# Patient Record
Sex: Male | Born: 1980 | Race: Black or African American | Hispanic: No | Marital: Single | Smoking: Current every day smoker
Health system: Southern US, Community
[De-identification: ages and names within clinical notes are randomized; demographics above are authoritative.]

## PROBLEM LIST (undated history)

## (undated) DIAGNOSIS — I10 Essential (primary) hypertension: Secondary | ICD-10-CM

## (undated) DIAGNOSIS — W3400XA Accidental discharge from unspecified firearms or gun, initial encounter: Secondary | ICD-10-CM

## (undated) DIAGNOSIS — R569 Unspecified convulsions: Secondary | ICD-10-CM

## (undated) DIAGNOSIS — F1010 Alcohol abuse, uncomplicated: Secondary | ICD-10-CM

## (undated) DIAGNOSIS — F10931 Alcohol use, unspecified with withdrawal delirium: Secondary | ICD-10-CM

## (undated) DIAGNOSIS — I639 Cerebral infarction, unspecified: Secondary | ICD-10-CM

## (undated) DIAGNOSIS — Z89512 Acquired absence of left leg below knee: Secondary | ICD-10-CM

## (undated) DIAGNOSIS — A419 Sepsis, unspecified organism: Secondary | ICD-10-CM

## (undated) HISTORY — PX: BELOW KNEE LEG AMPUTATION: SUR23

## (undated) HISTORY — PX: OTHER SURGICAL HISTORY: SHX169

---

## 2002-11-05 ENCOUNTER — Inpatient Hospital Stay (HOSPITAL_COMMUNITY): Admission: EM | Admit: 2002-11-05 | Discharge: 2002-11-07 | Payer: Self-pay | Admitting: *Deleted

## 2002-11-05 ENCOUNTER — Encounter: Payer: Self-pay | Admitting: *Deleted

## 2002-11-06 ENCOUNTER — Encounter: Payer: Self-pay | Admitting: Infectious Diseases

## 2003-01-14 ENCOUNTER — Encounter
Admission: RE | Admit: 2003-01-14 | Discharge: 2003-04-14 | Payer: Self-pay | Admitting: Physical Medicine & Rehabilitation

## 2003-07-06 ENCOUNTER — Emergency Department (HOSPITAL_COMMUNITY): Admission: EM | Admit: 2003-07-06 | Discharge: 2003-07-06 | Payer: Self-pay | Admitting: Emergency Medicine

## 2003-08-25 ENCOUNTER — Emergency Department (HOSPITAL_COMMUNITY): Admission: EM | Admit: 2003-08-25 | Discharge: 2003-08-25 | Payer: Self-pay | Admitting: Emergency Medicine

## 2009-03-22 ENCOUNTER — Emergency Department (HOSPITAL_COMMUNITY): Admission: EM | Admit: 2009-03-22 | Discharge: 2009-03-22 | Payer: Self-pay | Admitting: Emergency Medicine

## 2009-09-16 ENCOUNTER — Emergency Department (HOSPITAL_COMMUNITY): Admission: EM | Admit: 2009-09-16 | Discharge: 2009-09-16 | Payer: Self-pay | Admitting: Emergency Medicine

## 2009-12-16 ENCOUNTER — Emergency Department (HOSPITAL_COMMUNITY): Admission: EM | Admit: 2009-12-16 | Discharge: 2009-12-16 | Payer: Self-pay | Admitting: Emergency Medicine

## 2010-03-22 ENCOUNTER — Emergency Department (HOSPITAL_COMMUNITY): Admission: EM | Admit: 2010-03-22 | Discharge: 2010-03-23 | Payer: Self-pay | Admitting: Emergency Medicine

## 2011-03-24 LAB — COMPREHENSIVE METABOLIC PANEL
ALT: 16 U/L (ref 0–53)
AST: 21 U/L (ref 0–37)
Albumin: 3.6 g/dL (ref 3.5–5.2)
CO2: 27 mEq/L (ref 19–32)
Calcium: 9.2 mg/dL (ref 8.4–10.5)
GFR calc Af Amer: >60 mL/min (ref >60)
GFR calc non Af Amer: >60 mL/min (ref >60)
Sodium: 137 mEq/L (ref 135–145)

## 2011-03-24 LAB — DIFFERENTIAL
Eosinophils Absolute: 0.4 10*3/uL (ref 0.0–0.7)
Eosinophils Relative: 4 % (ref 0–5)
Lymphocytes Relative: 31 % (ref 12–46)
Lymphs Abs: 3.4 10*3/uL (ref 0.7–4.0)
Monocytes Absolute: 0.7 10*3/uL (ref 0.1–1.0)
Monocytes Relative: 6 % (ref 3–12)

## 2011-03-24 LAB — CBC
MCHC: 33.5 g/dL (ref 30.0–36.0)
Platelets: 295 10*3/uL (ref 150–400)
RBC: 5 MIL/uL (ref 4.22–5.81)
WBC: 11.3 10*3/uL — ABNORMAL HIGH (ref 4.0–10.5)

## 2011-03-24 LAB — RAPID URINE DRUG SCREEN, HOSP PERFORMED
Amphetamines: NOT DETECTED
Benzodiazepines: NOT DETECTED
Cocaine: NOT DETECTED
Tetrahydrocannabinol: NOT DETECTED

## 2011-03-24 LAB — LIPASE, BLOOD: Lipase: 22 U/L (ref 11–59)

## 2011-03-24 LAB — URINALYSIS, ROUTINE W REFLEX MICROSCOPIC
Ketones, ur: NEGATIVE mg/dL
Protein, ur: NEGATIVE mg/dL
Urobilinogen, UA: 0.2 mg/dL (ref 0.0–1.0)

## 2011-04-01 ENCOUNTER — Emergency Department (HOSPITAL_COMMUNITY)
Admission: EM | Admit: 2011-04-01 | Discharge: 2011-04-01 | Disposition: A | Discharge status: Home / Self Care | Payer: Medicaid Other | Attending: Emergency Medicine | Admitting: Emergency Medicine

## 2011-04-01 DIAGNOSIS — F411 Generalized anxiety disorder: Secondary | ICD-10-CM | POA: Insufficient documentation

## 2011-04-01 DIAGNOSIS — S88119A Complete traumatic amputation at level between knee and ankle, unspecified lower leg, initial encounter: Secondary | ICD-10-CM | POA: Insufficient documentation
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
## 2011-09-19 ENCOUNTER — Emergency Department (HOSPITAL_COMMUNITY): Payer: Medicaid Other

## 2011-09-19 ENCOUNTER — Emergency Department (HOSPITAL_COMMUNITY)
Admission: EM | Admit: 2011-09-19 | Discharge: 2011-09-19 | Disposition: A | Discharge status: Home / Self Care | Payer: Medicaid Other | Attending: Emergency Medicine | Admitting: Emergency Medicine

## 2011-09-19 DIAGNOSIS — M25569 Pain in unspecified knee: Secondary | ICD-10-CM | POA: Insufficient documentation

## 2011-09-19 DIAGNOSIS — Y92009 Unspecified place in unspecified non-institutional (private) residence as the place of occurrence of the external cause: Secondary | ICD-10-CM | POA: Insufficient documentation

## 2011-09-19 DIAGNOSIS — I10 Essential (primary) hypertension: Secondary | ICD-10-CM | POA: Insufficient documentation

## 2011-09-19 DIAGNOSIS — S88119A Complete traumatic amputation at level between knee and ankle, unspecified lower leg, initial encounter: Secondary | ICD-10-CM | POA: Insufficient documentation

## 2011-09-19 DIAGNOSIS — M25469 Effusion, unspecified knee: Secondary | ICD-10-CM | POA: Insufficient documentation

## 2011-09-19 DIAGNOSIS — S8990XA Unspecified injury of unspecified lower leg, initial encounter: Secondary | ICD-10-CM | POA: Insufficient documentation

## 2011-09-19 DIAGNOSIS — W108XXA Fall (on) (from) other stairs and steps, initial encounter: Secondary | ICD-10-CM | POA: Insufficient documentation

## 2011-11-15 ENCOUNTER — Emergency Department (HOSPITAL_COMMUNITY): Payer: Medicaid Other

## 2011-11-15 ENCOUNTER — Emergency Department (HOSPITAL_COMMUNITY)
Admission: EM | Admit: 2011-11-15 | Discharge: 2011-11-16 | Disposition: A | Discharge status: Home / Self Care | Payer: Medicaid Other | Attending: Emergency Medicine | Admitting: Emergency Medicine

## 2011-11-15 ENCOUNTER — Encounter: Payer: Self-pay | Admitting: Emergency Medicine

## 2011-11-15 DIAGNOSIS — S88119A Complete traumatic amputation at level between knee and ankle, unspecified lower leg, initial encounter: Secondary | ICD-10-CM | POA: Insufficient documentation

## 2011-11-15 DIAGNOSIS — W19XXXA Unspecified fall, initial encounter: Secondary | ICD-10-CM | POA: Insufficient documentation

## 2011-11-15 DIAGNOSIS — S7290XA Unspecified fracture of unspecified femur, initial encounter for closed fracture: Secondary | ICD-10-CM | POA: Insufficient documentation

## 2011-11-15 DIAGNOSIS — S7292XA Unspecified fracture of left femur, initial encounter for closed fracture: Secondary | ICD-10-CM

## 2011-11-15 DIAGNOSIS — M25569 Pain in unspecified knee: Secondary | ICD-10-CM | POA: Insufficient documentation

## 2011-11-15 NOTE — ED Notes (Signed)
EAV:[id number]<JX> Expected date:11/15/11<BR> Expected time:10:41 PM<BR> Means of arrival:Ambulance<BR> Comments:<BR> EMS, leg pain

## 2011-11-15 NOTE — ED Notes (Signed)
Pt had fallen yesterday, and since then he has had pain in his left knee.  Pt states that he has a sore from his prostetic.  Pt states that he has fluid in his knee.

## 2011-11-16 MED ORDER — OXYCODONE-ACETAMINOPHEN 5-325 MG PO TABS: 2.0000 | ORAL_TABLET | ORAL | Status: AC | PRN

## 2011-11-16 MED ORDER — OXYCODONE-ACETAMINOPHEN 5-325 MG PO TABS
1.0000 | ORAL_TABLET | Freq: Once | ORAL | Status: AC
  Administered 2011-11-16: 1 via ORAL
  Filled 2011-11-16: qty 1

## 2011-11-16 NOTE — ED Provider Notes (Signed)
History     CSN: [id number] Arrival date & time: 11/15/2011 11:05 PM   First MD Initiated Contact with Patient 11/15/11 2318      Chief Complaint  Patient presents with  . Knee Pain    fall, skin ulceration from prostetic, possible infection in knee    (Consider location/radiation/quality/duration/timing/severity/associated sxs/prior treatment) Patient is a 30 y.o. male presenting with knee pain. The history is provided by the patient.  Knee Pain This is a new problem. The current episode started 2 days ago. The problem occurs constantly. The problem has not changed since onset.Pertinent negatives include no chest pain, no abdominal pain, no headaches and no shortness of breath. The symptoms are aggravated by walking. The symptoms are relieved by nothing. He has tried nothing for the symptoms.   patient has left BKA from previous injury and uses prosthesis to walk. He fell 2 days ago and injured his left knee. He now complains of pain and swelling. Pain is sharp in nature. Is not radiating. Moderate to severe. No abrasion or laceration or open skin. No other injury with fall.  No past medical history on file.  Past Surgical History  Procedure Date  . Below the knee amputation     No family history on file.  History  Substance Use Topics  . Smoking status: Not on file  . Smokeless tobacco: Not on file  . Alcohol Use:       Review of Systems  Constitutional: Negative for fever and chills.  HENT: Negative for neck pain and neck stiffness.   Eyes: Negative for pain.  Respiratory: Negative for shortness of breath.   Cardiovascular: Negative for chest pain.  Gastrointestinal: Negative for abdominal pain.  Genitourinary: Negative for dysuria.  Musculoskeletal: Positive for joint swelling and gait problem. Negative for back pain.  Skin: Negative for rash and wound.  Neurological: Negative for headaches.  All other systems reviewed and are negative.    Allergies  Review  of patient's allergies indicates no known allergies.  Home Medications  No current outpatient prescriptions on file.  BP 120/70  Pulse 95  Temp(Src) 98.6 F (37 C) (Oral)  Resp 18  SpO2 99%  Physical Exam  Constitutional: He is oriented to person, place, and time. He appears well-developed and well-nourished.  HENT:  Head: Normocephalic and atraumatic.  Eyes: Conjunctivae and EOM are normal. Pupils are equal, round, and reactive to light.  Neck: Trachea normal. Neck supple. No thyromegaly present.  Cardiovascular: Normal rate, regular rhythm, S1 normal, S2 normal and normal pulses.     No systolic murmur is present   No diastolic murmur is present  Pulses:      Radial pulses are 2+ on the right side, and 2+ on the left side.  Pulmonary/Chest: Effort normal and breath sounds normal. He has no wheezes. He has no rhonchi. He has no rales. He exhibits no tenderness.  Abdominal: Soft. Normal appearance and bowel sounds are normal. There is no tenderness. There is no CVA tenderness and negative Murphy's sign.  Musculoskeletal:       Lower extremity: BKA, knee is tender to palpation with moderate effusion, no skin defects, distal cap refill intact. Hip is nontender and pelvis stable.  Neurological: He is alert and oriented to person, place, and time. He has normal strength. No cranial nerve deficit or sensory deficit. GCS eye subscore is 4. GCS verbal subscore is 5. GCS motor subscore is 6.  Skin: Skin is warm and dry. No rash noted.  He is not diaphoretic.  Psychiatric: His speech is normal.       Cooperative and appropriate    ED Course  Procedures (including critical care time)  Labs Reviewed - No data to display Dg Knee Complete 4 Views Left  11/16/2011  *RADIOLOGY REPORT*  Clinical Data: Status post fall.  Knee pain.  LEFT KNEE - COMPLETE 4+ VIEW  Comparison: 09/19/2011 radiographs.  Findings: The patient is status post below-the-knee amputation. The remaining tibia and fibula  appear normal.  There is a new acute fracture of the distal femur.  This has a component traversing the metaphysis with mild displacement medially.  There is probable intercondylar extension.  There is a moderate sized knee joint effusion.  The patella is intact.  IMPRESSION: Acute mildly displaced distal femur fracture with transverse and probable intercondylar components.  Associated moderate sized hemarthrosis.  Original Report Authenticated By: Gerrianne Scale, M.D.     Xray and pain control   MDM   The injury and distal femur fracture as above. Case discussed as above with orthopedics on call Dr. Rennis Chris. He feels is a nonsurgical issue but will followup in clinic on Wednesday. He recommends pain control, crutches and nonweightbearing to his left lower extremity. Patient stable for discharge with pain controled.         Sunnie Nielsen, MD 11/16/11 [phone]

## 2012-02-18 ENCOUNTER — Emergency Department (HOSPITAL_COMMUNITY)
Admission: EM | Admit: 2012-02-18 | Discharge: 2012-02-18 | Discharge status: Against Medical Advice | Payer: Medicaid Other | Attending: Emergency Medicine | Admitting: Emergency Medicine

## 2012-02-18 ENCOUNTER — Encounter (HOSPITAL_COMMUNITY): Payer: Self-pay

## 2012-02-18 DIAGNOSIS — R040 Epistaxis: Secondary | ICD-10-CM | POA: Insufficient documentation

## 2012-02-18 NOTE — ED Notes (Signed)
Pt states he feels like his mind isn't working.  When asked why he indicated by trying to breathe in through his nose-congestion is heard.  States he has been having panic attacks since 2010 but untreated by medications. Pt states he is very thirsty, so much so he could drink out of the sink right now.

## 2012-02-18 NOTE — ED Notes (Signed)
ZOX:[id number]> Expected date:<BR> Expected time:12:21 PM<BR> Means of arrival:<BR> Comments:<BR> RSV2 - 30yoM minor nosebleed, controlled.  Triage-capable.

## 2012-02-18 NOTE — ED Notes (Signed)
Pt was picked up at a convenience store.  Pt told the clerk that he was getting sob w/running nose.  His nose started to bleed.  EMS arrived and found pt to be poor historian and unable to stay on track when asked questions.  Pt's complaints resloved on ride here.  Pt admitted to drinking 1 beer today.  EMS feels pt may be under influence of an illegal substance.  Vitals: 128/88, 128, 18.

## 2012-04-27 ENCOUNTER — Emergency Department (HOSPITAL_COMMUNITY): Payer: Medicaid Other

## 2012-04-27 ENCOUNTER — Inpatient Hospital Stay (HOSPITAL_COMMUNITY)
Admission: EM | Admit: 2012-04-27 | Discharge: 2012-04-28 | DRG: 081 | Disposition: A | Discharge status: Home / Self Care | Payer: Medicaid Other | Source: Ambulatory Visit | Attending: Internal Medicine | Admitting: Internal Medicine

## 2012-04-27 ENCOUNTER — Encounter (HOSPITAL_COMMUNITY): Payer: Self-pay | Admitting: *Deleted

## 2012-04-27 ENCOUNTER — Inpatient Hospital Stay (HOSPITAL_COMMUNITY): Payer: Medicaid Other

## 2012-04-27 DIAGNOSIS — Y998 Other external cause status: Secondary | ICD-10-CM

## 2012-04-27 DIAGNOSIS — I517 Cardiomegaly: Secondary | ICD-10-CM

## 2012-04-27 DIAGNOSIS — R55 Syncope and collapse: Secondary | ICD-10-CM | POA: Diagnosis present

## 2012-04-27 DIAGNOSIS — W19XXXA Unspecified fall, initial encounter: Secondary | ICD-10-CM

## 2012-04-27 DIAGNOSIS — R000 Tachycardia, unspecified: Secondary | ICD-10-CM | POA: Diagnosis present

## 2012-04-27 DIAGNOSIS — E875 Hyperkalemia: Secondary | ICD-10-CM | POA: Diagnosis present

## 2012-04-27 DIAGNOSIS — Z89512 Acquired absence of left leg below knee: Secondary | ICD-10-CM | POA: Diagnosis present

## 2012-04-27 DIAGNOSIS — E876 Hypokalemia: Secondary | ICD-10-CM | POA: Diagnosis present

## 2012-04-27 DIAGNOSIS — F17200 Nicotine dependence, unspecified, uncomplicated: Secondary | ICD-10-CM | POA: Diagnosis present

## 2012-04-27 DIAGNOSIS — R296 Repeated falls: Secondary | ICD-10-CM | POA: Diagnosis present

## 2012-04-27 DIAGNOSIS — R569 Unspecified convulsions: Secondary | ICD-10-CM

## 2012-04-27 DIAGNOSIS — Z720 Tobacco use: Secondary | ICD-10-CM | POA: Diagnosis present

## 2012-04-27 DIAGNOSIS — I712 Thoracic aortic aneurysm, without rupture: Secondary | ICD-10-CM | POA: Diagnosis present

## 2012-04-27 DIAGNOSIS — F1020 Alcohol dependence, uncomplicated: Secondary | ICD-10-CM | POA: Diagnosis present

## 2012-04-27 DIAGNOSIS — R404 Transient alteration of awareness: Principal | ICD-10-CM | POA: Diagnosis present

## 2012-04-27 DIAGNOSIS — Z8659 Personal history of other mental and behavioral disorders: Secondary | ICD-10-CM

## 2012-04-27 DIAGNOSIS — S88119A Complete traumatic amputation at level between knee and ankle, unspecified lower leg, initial encounter: Secondary | ICD-10-CM

## 2012-04-27 DIAGNOSIS — Y92009 Unspecified place in unspecified non-institutional (private) residence as the place of occurrence of the external cause: Secondary | ICD-10-CM

## 2012-04-27 DIAGNOSIS — G90A Postural orthostatic tachycardia syndrome (POTS): Secondary | ICD-10-CM | POA: Diagnosis present

## 2012-04-27 DIAGNOSIS — I7121 Aneurysm of the ascending aorta, without rupture: Secondary | ICD-10-CM | POA: Diagnosis present

## 2012-04-27 DIAGNOSIS — K760 Fatty (change of) liver, not elsewhere classified: Secondary | ICD-10-CM | POA: Diagnosis present

## 2012-04-27 DIAGNOSIS — K8020 Calculus of gallbladder without cholecystitis without obstruction: Secondary | ICD-10-CM | POA: Diagnosis present

## 2012-04-27 DIAGNOSIS — F1010 Alcohol abuse, uncomplicated: Secondary | ICD-10-CM | POA: Diagnosis present

## 2012-04-27 DIAGNOSIS — F79 Unspecified intellectual disabilities: Secondary | ICD-10-CM | POA: Diagnosis present

## 2012-04-27 HISTORY — DX: Unspecified convulsions: R56.9

## 2012-04-27 LAB — LIPASE, BLOOD: Lipase: 57 U/L (ref 11–59)

## 2012-04-27 LAB — RAPID URINE DRUG SCREEN, HOSP PERFORMED
Benzodiazepines: POSITIVE — AB
Cocaine: NOT DETECTED
Opiates: NOT DETECTED
Tetrahydrocannabinol: NOT DETECTED

## 2012-04-27 LAB — DIFFERENTIAL
Basophils Absolute: 0 10*3/uL (ref 0.0–0.1)
Basophils Relative: 0 % (ref 0–1)
Eosinophils Relative: 1 % (ref 0–5)
Lymphocytes Relative: 15 % (ref 12–46)
Monocytes Absolute: 0.7 10*3/uL (ref 0.1–1.0)
Neutro Abs: 6.2 10*3/uL (ref 1.7–7.7)

## 2012-04-27 LAB — COMPREHENSIVE METABOLIC PANEL
ALT: 104 U/L — ABNORMAL HIGH (ref 0–53)
AST: 269 U/L — ABNORMAL HIGH (ref 0–37)
Albumin: 3.8 g/dL (ref 3.5–5.2)
CO2: 20 mEq/L (ref 19–32)
Calcium: 9.7 mg/dL (ref 8.4–10.5)
Chloride: 94 mEq/L — ABNORMAL LOW (ref 96–112)
Creatinine, Ser: 0.8 mg/dL (ref 0.50–1.35)
GFR calc non Af Amer: >90 mL/min (ref >90)
Sodium: 136 mEq/L (ref 135–145)

## 2012-04-27 LAB — CBC
MCHC: 36 g/dL (ref 30.0–36.0)
Platelets: 43 10*3/uL — ABNORMAL LOW (ref 150–400)
RDW: 16.9 % — ABNORMAL HIGH (ref 11.5–15.5)
WBC: 8.1 10*3/uL (ref 4.0–10.5)

## 2012-04-27 LAB — GLUCOSE, CAPILLARY: Glucose-Capillary: 96 mg/dL (ref 70–99)

## 2012-04-27 LAB — TSH: TSH: 0.525 u[IU]/mL (ref 0.350–4.500)

## 2012-04-27 LAB — PHOSPHORUS: Phosphorus: 3 mg/dL (ref 2.3–4.6)

## 2012-04-27 LAB — MAGNESIUM: Magnesium: 1.7 mg/dL (ref 1.5–2.5)

## 2012-04-27 MED ORDER — ONDANSETRON HCL 4 MG/2ML IJ SOLN
4.0000 mg | Freq: Once | INTRAMUSCULAR | Status: AC
  Administered 2012-04-27: 4 mg via INTRAVENOUS

## 2012-04-27 MED ORDER — LORAZEPAM 2 MG/ML IJ SOLN
1.0000 mg | INTRAMUSCULAR | Status: DC | PRN
Start: 2012-04-27 — End: 2012-04-28

## 2012-04-27 MED ORDER — THIAMINE HCL 100 MG/ML IJ SOLN
Freq: Once | INTRAVENOUS | Status: AC
  Administered 2012-04-27: 15:00:00 via INTRAVENOUS
  Filled 2012-04-27 (×2): qty 1000

## 2012-04-27 MED ORDER — ENOXAPARIN SODIUM 40 MG/0.4ML ~~LOC~~ SOLN
40.0000 mg | SUBCUTANEOUS | Status: DC
  Administered 2012-04-27: 40 mg via SUBCUTANEOUS
  Filled 2012-04-27 (×2): qty 0.4

## 2012-04-27 MED ORDER — IOHEXOL 350 MG/ML SOLN
100.0000 mL | Freq: Once | INTRAVENOUS | Status: AC | PRN
  Administered 2012-04-27: 100 mL via INTRAVENOUS

## 2012-04-27 MED ORDER — LORAZEPAM 2 MG/ML IJ SOLN: 1.0000 mg | Freq: Four times a day (QID) | INTRAMUSCULAR | Status: DC | PRN

## 2012-04-27 MED ORDER — CHLORDIAZEPOXIDE HCL 10 MG PO CAPS: 10.0000 mg | ORAL_CAPSULE | Freq: Three times a day (TID) | ORAL | Status: DC

## 2012-04-27 MED ORDER — FOLIC ACID 1 MG PO TABS
1.0000 mg | ORAL_TABLET | Freq: Every day | ORAL | Status: DC
  Administered 2012-04-28: 1 mg via ORAL
  Filled 2012-04-27: qty 1

## 2012-04-27 MED ORDER — LORAZEPAM 0.5 MG PO TABS: 1.0000 mg | ORAL_TABLET | Freq: Four times a day (QID) | ORAL | Status: DC | PRN

## 2012-04-27 MED ORDER — LORAZEPAM 0.5 MG PO TABS: 2.0000 mg | ORAL_TABLET | ORAL | Status: DC | PRN

## 2012-04-27 MED ORDER — VITAMIN B-1 100 MG PO TABS
100.0000 mg | ORAL_TABLET | Freq: Every day | ORAL | Status: DC
  Administered 2012-04-28: 100 mg via ORAL
  Filled 2012-04-27: qty 1

## 2012-04-27 MED ORDER — SODIUM CHLORIDE 0.9 % IV SOLN: 1.0000 mg | Freq: Once | INTRAVENOUS | Status: DC

## 2012-04-27 MED ORDER — SODIUM CHLORIDE 0.9 % IJ SOLN
3.0000 mL | Freq: Two times a day (BID) | INTRAMUSCULAR | Status: DC
  Administered 2012-04-27 – 2012-04-28 (×2): 3 mL via INTRAVENOUS

## 2012-04-27 MED ORDER — THIAMINE HCL 100 MG/ML IJ SOLN
Freq: Once | INTRAVENOUS | Status: AC
  Administered 2012-04-27: 04:00:00 via INTRAVENOUS
  Filled 2012-04-27: qty 1000

## 2012-04-27 MED ORDER — SODIUM CHLORIDE 0.9 % IV BOLUS (SEPSIS)
1000.0000 mL | Freq: Once | INTRAVENOUS | Status: AC
  Administered 2012-04-27: 1000 mL via INTRAVENOUS

## 2012-04-27 MED ORDER — ONDANSETRON HCL 4 MG/2ML IJ SOLN
INTRAMUSCULAR | Status: AC
  Administered 2012-04-27: 4 mg via INTRAVENOUS
  Filled 2012-04-27: qty 2

## 2012-04-27 MED ORDER — LORAZEPAM 2 MG/ML IJ SOLN
2.0000 mg | Freq: Once | INTRAMUSCULAR | Status: AC
  Administered 2012-04-27: 2 mg via INTRAVENOUS
  Filled 2012-04-27: qty 1

## 2012-04-27 MED ORDER — LORAZEPAM 2 MG/ML IJ SOLN
1.0000 mg | Freq: Once | INTRAMUSCULAR | Status: AC
  Administered 2012-04-27: 1 mg via INTRAVENOUS
  Filled 2012-04-27: qty 1

## 2012-04-27 NOTE — H&P (Signed)
Hospital Admission Note Date: 04/27/2012  Patient name:  [name]  Medical record number:  [id number] Date of birth:  April 11, 1981  Age: 31 y.o. Gender:  male PCP:    Provider Not In System  Medical Service:   Internal Medicine Teaching Service   Medical Service: [place]  Attending physician: Dr. Rogelia Boga    1st Contact:     Dr. Dierdre Searles                Pager: [phone] 2nd Contact:    Dr. Allena Katz                   Pager: [phone]  After 5 pm or weekends: 1st Contact:      Pager: [phone] 2nd Contact:      Pager: [phone]     Chief Complaint: Dizziness and fall  History of Present Illness: Patient is a 31 y.o. male with a PMHx of left BKA and chronic alcohol use presented to the ER last night with chief complaint of loss of consciousness after a fall. Patient was apparently sitting in his bed and watching TV when he got up to light a cigarette, as soon as he got up he felt dizzy and lightheaded and fell to the ground. He did not hit his head and fell sideways. Patient denied any emotional distress, fatigue or palpitations prior to the fall. Patient has never had this kind of fall in the past. He was down for about 3 minutes according to him before he was woken up by his sister. His sister called EMS which noted the patient was shaking all the extremities, although complete report is unknown. Patient denied any nausea, vomiting, cough, chest pain or any neurological prodrome prior to fall. No bladder or bowel incontinence noted. Patient was noted to be confused in the emergency room.   Apparently patient got back from a trip from [place] 2 days ago which was a road trip. Patient sees that usually he drinks 9-10 bottles of 40 hours beers a day but he hasn't been drinking as much since last 4-5 days as he was on road. He also mentioned that he was feeling shakes and tremors for about 4-5 days and he drank a few beers yesterday(prior to event) which calmed him down a little bit.  Patient does  not have any history of seizures, cerebrovascular events, migraine or any other intracranial abnormality. He does not take any medications and does not have a primary care physician at this time. Patient follows up with orthopedics for his left BKA.   Current Outpatient Medications: None  Allergies: No known drug allergies  Past Medical History: Left BKA  Past Surgical History: Past Surgical History  Procedure Date  . Below the knee amputation   . Below knee leg amputation     LEFT    Family History: No family history of structural heart disease or sudden cardiac death  Social History: Patient is not married and he stays with his sister in [place]. He attended a special school and did not attend college. He is on disability secondary to his left BKA and mental retardation. The patient has never taken a job. He drinks 10 bottles of 40 all his beer a day and he has been drinking for the last 10 years. He smokes half pack per day and he's been smoking for about 16 years which makes it 8 pack years of smoking.  Review of Systems: Pertinent items are noted in HPI.  Vital Signs:  BP 117/68  Pulse 73  Temp(Src) 98.8 F (37.1 C) (Oral)  Resp 18  SpO2 97%   Physical Exam: General: Vital signs reviewed and noted. Well-developed, well-nourished, in no acute distress; alert, appropriate and cooperative throughout examination.  Head: Normocephalic, atraumatic.  Eyes: PERRL, EOMI, No signs of anemia or jaundince.  Ears: TM nonerythematous, not bulging, good light reflex bilaterally.  Nose: Mucous membranes moist, not inflammed, nonerythematous.  Throat: Oropharynx nonerythematous, no exudate appreciated.   Neck: No deformities, masses, or tenderness noted.Supple, No carotid Bruits, no JVD.  Lungs:  Normal respiratory effort. Clear to auscultation BL without crackles or wheezes.  Heart: Tachycardic, regular rate and rhythm, S1 and S2 normal without gallop, murmur, or rubs.    Abdomen:  BS normoactive. Soft, Nondistended, non-tender.  No masses or organomegaly.  Extremities: No pretibial edema. Patient is left BKA  Neurologic: A&O X 3, CN II - XII are grossly intact. Motor strength is 5/5 in the all 4 extremities, Sensations intact to light touch, Cerebellar signs negative.  Skin: No visible rashes, scars.   Lab results: CBC:    Component Value Date/Time   WBC 8.1 04/27/2012 0106   HGB 14.6 04/27/2012 0106   HCT 40.5 04/27/2012 0106   PLT 43* 04/27/2012 0106   MCV 92.5 04/27/2012 0106   NEUTROABS 6.2 04/27/2012 0106   LYMPHSABS 1.2 04/27/2012 0106   MONOABS 0.7 04/27/2012 0106   EOSABS 0.1 04/27/2012 0106   BASOSABS 0.0 04/27/2012 0106      Comprehensive Metabolic Panel:    Component Value Date/Time   NA 136 04/27/2012 0106   K 3.3* 04/27/2012 0106   CL 94* 04/27/2012 0106   CO2 20 04/27/2012 0106   BUN 7 04/27/2012 0106   CREATININE 0.80 04/27/2012 0106   GLUCOSE 94 04/27/2012 0106   CALCIUM 9.7 04/27/2012 0106   AST 269* 04/27/2012 0106   ALT 104* 04/27/2012 0106   ALKPHOS 130* 04/27/2012 0106   BILITOT 0.7 04/27/2012 0106   PROT 7.7 04/27/2012 0106   ALBUMIN 3.8 04/27/2012 0106      Imaging results:   US abdomen: 04/27/12  1. Distended gallbladder with sludge and possible tiny stones.  There is no evidence for cholecystitis.  2. Fatty infiltration of the liver.  3. No other acute abnormality of the abdomen.  CT head: 04/27/12 IMPRESSION:  No acute intracranial abnormalities identified.  CXR: 04/27/12 IMPRESSION:  No evidence of active pulmonary disease.    Assessment & Plan: Patient is a 31 year old man with past medical history significant for alcohol abuse and left BKA and mental retardation who was admitted for loss of consciousness after a fall.  Problem #1 syncope #2 alcohol abuse complicated by transaminitis #3 mental retardation #4 mild hypokalemia #5 anion gap noted on CMP  Plan: Patient had an episode of syncope which is  evidenced by the fact that he regained his consciousness. The differential is wide and broadly includes neurocardiogenic causes, orthostatic causes, cardiovascular causes, and less likely neurologic. The 12-lead echocardiogram is negative for any abnormalities, I would admit the patient to telemetry to monitor his heart rate to look for arrhythmia as a cause for syncope. I would check orthostatic blood pressure to r/o hypovolemia. Neurocardiogenic or vasovagal cause is likely as evidenced by syncope at the change of position. I will also do a 2-D echocardiogram to rule out a structural heart abnormality.  Patient is a chronic alcoholic and given that he had been having shakes and tremors since last few days, a  seizure activity From alcohol withdrawal could very well be a cause for his syncope. I would put him on CIWA protocol every 2 hours and start him on scheduled Librium. Ativan when necessary for both anxiety and seizure activity has been put in the chart. I would replete potassium. Start him on regular diet and supplement thiamine and folate which is usually low in chronic alcoholics.  Patient has an anion gap of about 22 but there is no reason to believe that he is acidotic at this moment. Patient's urine ketones were negative and he is back to his baseline mental status. We will monitor daily BMPs to document resolution. The likelihood of this being non ethanol toxicity is very low given that patient's mental status is back to baseline.   DVT PPX:  Lovenox  Lars Mage MD R3 Internal Medicine Resident Pager [phone] 9:14 AM

## 2012-04-27 NOTE — Progress Notes (Signed)
  Echocardiogram 2D Echocardiogram has been performed.  Retina Bernardy L 04/27/2012, 4:25 PM
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
## 2012-04-27 NOTE — ED Notes (Signed)
Pt Alert to self and place, not date.  Pt has tremors, but no seizure activity at this time.

## 2012-04-27 NOTE — Progress Notes (Signed)
Nursing Note:Pt arrived to room safely with no complaints. Pt put on cardiac monitor, NSR in 80's. IV within normal limits. Pt educated on safety and given safety plan. Call bell within place. Will continue to monitor pt. Calem Cocozza Scientist, clinical (histocompatibility and immunogenetics).

## 2012-04-27 NOTE — ED Notes (Signed)
Called [place].  Tech unavailable.  Stated to call back in 30 minutes.

## 2012-04-27 NOTE — ED Notes (Signed)
Pt ao x 4. HR ST of 105. Continues to feel nauseated and c/o dizziness when moving from laying to sitting position.   C/o diffuse abd pain and constipation for several weeks.

## 2012-04-27 NOTE — ED Notes (Signed)
Called [place].  They stated they are switching shifts and should pick up pt in 30 min.

## 2012-04-27 NOTE — ED Notes (Signed)
Patient transported to Ultrasound 
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
## 2012-04-27 NOTE — Progress Notes (Signed)
UR Completed. Kasin Tonkinson, RN, Nurse Case Manager [phone]     

## 2012-04-28 LAB — HEPATIC FUNCTION PANEL
ALT: 72 U/L — ABNORMAL HIGH (ref 0–53)
AST: 128 U/L — ABNORMAL HIGH (ref 0–37)
Alkaline Phosphatase: 109 U/L (ref 39–117)
Bilirubin, Direct: 0.2 mg/dL (ref 0.0–0.3)
Indirect Bilirubin: 0.4 mg/dL (ref 0.3–0.9)

## 2012-04-28 LAB — BASIC METABOLIC PANEL
BUN: 5 mg/dL — ABNORMAL LOW (ref 6–23)
CO2: 26 mEq/L (ref 19–32)
Calcium: 9.5 mg/dL (ref 8.4–10.5)
Chloride: 96 mEq/L (ref 96–112)
Creatinine, Ser: 0.77 mg/dL (ref 0.50–1.35)
Glucose, Bld: 88 mg/dL (ref 70–99)
Potassium: 3.2 mEq/L — ABNORMAL LOW (ref 3.5–5.1)

## 2012-04-28 MED ORDER — THIAMINE HCL 100 MG PO TABS: 100.0000 mg | ORAL_TABLET | Freq: Every day | ORAL | Status: DC

## 2012-04-28 MED ORDER — FOLIC ACID 1 MG PO TABS: 1.0000 mg | ORAL_TABLET | Freq: Every day | ORAL | Status: DC

## 2012-04-28 MED ORDER — LORAZEPAM 0.5 MG PO TABS: 0.5000 mg | ORAL_TABLET | Freq: Every day | ORAL | Status: DC | PRN

## 2012-04-28 NOTE — Discharge Instructions (Signed)
1. Stop alcohol and Tobacco 2. Follow up with [facility] IM outpatient clinic, nurse will give your direction 3. Go to ED if you have chest pain.

## 2012-04-28 NOTE — Consult Note (Signed)
Pt smokes 8 cigarettes per day. He's quit for 3 years previously. States he plans to quit in 2-3 months. Encouraged pt to quit. He is in preparation stage. Referred to 1-800 quit now for f/u and support. Discussed oral fixation substitutes, second hand smoke and in home smoking policy. Reviewed and gave pt Written education/contact information.
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
## 2012-04-28 NOTE — Progress Notes (Signed)
RN made aware that pt's HR ^ 150s via tele, non sustained. Pt up going to the restroom. Asymptomatic at this time. MD made aware. No new orders given. Will continue to monitor. Sharlene Dory, RN
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
## 2012-04-28 NOTE — Progress Notes (Signed)
The need to quite smoking and drinking was reviewed with client.  He is to follow up with physician.  At this time he is thinking about it and has spoken about in detail with the physician.

## 2012-04-28 NOTE — Progress Notes (Signed)
S: feels better. No c/o. No dizziness, lightheadedness or vertigo.  wants to go home. O: General: NAD      Neck: No JVD      Lungs: CTA B/L      Heart: RRR. No M/G/R      Abd: soft. BS x 4      Ext: no edema A/P  - patient is stable and will be discharged home - his denies LOC and states that he tripped on his left prosthetic leg and fell on the day of admission. He admits that he drinks 9-10 40 oz daily and states that he consumed similar amount of alcohol when he fell.  - his fall is likely mechanic fall associated with dizziness 2/2 heavy alcohol consumption.  - he is educated on alcohol and Tobacco cessation. But he does not want to hear about them and would like to talk to SW as an outpatient. - He is clinically stable. I will prescribed him with Folic acid and Thiamine and discharge him today.  - he will follow up with [facility] on 05/09/12.   LOS: 1 day   Tiran Sauseda 04/28/2012, 5:05 PM   Late entry

## 2012-05-03 ENCOUNTER — Other Ambulatory Visit: Payer: Self-pay | Admitting: Internal Medicine

## 2012-05-03 DIAGNOSIS — Y92009 Unspecified place in unspecified non-institutional (private) residence as the place of occurrence of the external cause: Secondary | ICD-10-CM

## 2012-05-03 DIAGNOSIS — Z720 Tobacco use: Secondary | ICD-10-CM | POA: Diagnosis present

## 2012-05-03 DIAGNOSIS — I7121 Aneurysm of the ascending aorta, without rupture: Secondary | ICD-10-CM | POA: Diagnosis present

## 2012-05-03 DIAGNOSIS — K8020 Calculus of gallbladder without cholecystitis without obstruction: Secondary | ICD-10-CM | POA: Diagnosis present

## 2012-05-03 DIAGNOSIS — K760 Fatty (change of) liver, not elsewhere classified: Secondary | ICD-10-CM | POA: Diagnosis present

## 2012-05-03 DIAGNOSIS — Z8659 Personal history of other mental and behavioral disorders: Secondary | ICD-10-CM

## 2012-05-03 DIAGNOSIS — I712 Thoracic aortic aneurysm, without rupture: Secondary | ICD-10-CM | POA: Diagnosis present

## 2012-05-03 DIAGNOSIS — E876 Hypokalemia: Secondary | ICD-10-CM | POA: Diagnosis present

## 2012-05-03 MED ORDER — POTASSIUM CHLORIDE ER 10 MEQ PO TBCR: 40.0000 meq | EXTENDED_RELEASE_TABLET | Freq: Every day | ORAL | Status: DC

## 2012-05-04 NOTE — Progress Notes (Signed)
Try calling pt past 2 days - [phone] wrong # and [phone] Pollie Friar N/A. Talked with Dr Dierdre Searles - decided to mail Rx to pt for K Cl - placed in mail 05/04/12.

## 2012-05-09 ENCOUNTER — Encounter: Payer: Medicaid Other | Admitting: Internal Medicine

## 2012-05-16 ENCOUNTER — Encounter: Payer: Medicaid Other | Admitting: Internal Medicine

## 2012-10-15 ENCOUNTER — Emergency Department (HOSPITAL_COMMUNITY)
Admission: EM | Admit: 2012-10-15 | Discharge: 2012-10-15 | Disposition: A | Discharge status: Home / Self Care | Payer: Medicaid Other | Attending: Emergency Medicine | Admitting: Emergency Medicine

## 2012-10-15 ENCOUNTER — Other Ambulatory Visit: Payer: Self-pay

## 2012-10-15 DIAGNOSIS — S88119A Complete traumatic amputation at level between knee and ankle, unspecified lower leg, initial encounter: Secondary | ICD-10-CM | POA: Insufficient documentation

## 2012-10-15 DIAGNOSIS — F17200 Nicotine dependence, unspecified, uncomplicated: Secondary | ICD-10-CM | POA: Insufficient documentation

## 2012-10-15 DIAGNOSIS — G40909 Epilepsy, unspecified, not intractable, without status epilepticus: Secondary | ICD-10-CM | POA: Insufficient documentation

## 2012-10-15 DIAGNOSIS — R569 Unspecified convulsions: Secondary | ICD-10-CM

## 2012-10-15 LAB — CBC
HCT: 45.5 % (ref 39.0–52.0)
Hemoglobin: 16.6 g/dL (ref 13.0–17.0)
MCH: 34.7 pg — ABNORMAL HIGH (ref 26.0–34.0)
MCHC: 36.5 g/dL — ABNORMAL HIGH (ref 30.0–36.0)
MCV: 95.2 fL (ref 78.0–100.0)

## 2012-10-15 LAB — COMPREHENSIVE METABOLIC PANEL
Alkaline Phosphatase: 65 U/L (ref 39–117)
BUN: 7 mg/dL (ref 6–23)
Calcium: 8.9 mg/dL (ref 8.4–10.5)
Creatinine, Ser: 1.11 mg/dL (ref 0.50–1.35)
GFR calc Af Amer: >90 mL/min (ref >90)
Glucose, Bld: 102 mg/dL — ABNORMAL HIGH (ref 70–99)
Total Protein: 6.9 g/dL (ref 6.0–8.3)

## 2012-10-15 NOTE — ED Provider Notes (Addendum)
History     CSN: [id number]  Arrival date & time 10/15/12  0125   First MD Initiated Contact with Patient 10/15/12 0403      Chief Complaint  Patient presents with  . Seizures    (Consider location/radiation/quality/duration/timing/severity/associated sxs/prior treatment) Patient is a 31 y.o. male presenting with seizures. The history is provided by the patient.  Seizures   He has a history of seizures but is not on any medication for them. He thinks he had a seizure tonight but does not have any specific memory of that. He denies bit lip or tongue he denies incontinence. He states that he drinks alcohol regularly-usually 24 ounces of beer a day. He has not had any alcohol today. Denies other drug use.  Past Medical History  Diagnosis Date  . Seizures     Past Surgical History  Procedure Date  . Below the knee amputation   . Below knee leg amputation     LEFT    No family history on file.  History  Substance Use Topics  . Smoking status: Current Every Day Smoker -- 0.3 packs/day for 20 years    Types: Cigarettes  . Smokeless tobacco: Never Used  . Alcohol Use: Yes     Comment: not every day      Review of Systems  Neurological: Positive for seizures.  All other systems reviewed and are negative.    Allergies  Review of patient's allergies indicates no known allergies.  Home Medications   Current Outpatient Rx  Name  Route  Sig  Dispense  Refill  . POTASSIUM CHLORIDE ER 10 MEQ PO TBCR   Oral   Take 4 tablets (40 mEq total) by mouth daily.   8 tablet   0     BP 99/62  Pulse 95  Temp 98.8 F (37.1 C) (Oral)  Resp 18  SpO2 96%  Physical Exam  Nursing note and vitals reviewed. 31 year old male, resting comfortably and in no acute distress. Vital signs are normal. Oxygen saturation is 96%, which is normal. Head is normocephalic and atraumatic. PERRLA, EOMI. Oropharynx is clear. Fundi show no hemorrhage, exudate, or papilledema. Neck is nontender  and supple without adenopathy or JVD. Back is nontender and there is no CVA tenderness. Lungs are clear without rales, wheezes, or rhonchi. Chest is nontender. Heart has regular rate and rhythm without murmur. Abdomen is soft, flat, nontender without masses or hepatosplenomegaly and peristalsis is normoactive. Extremities have no cyanosis or edema, full range of motion is present. Skin is warm and dry without rash. Neurologic: He is somnolent but arousable and oriented to person and when aroused, cranial nerves are intact, there are no motor or sensory deficits.   ED Course  Procedures (including critical care time)  Results for orders placed during the hospital encounter of 10/15/12  ETHANOL      Component Value Range   Alcohol, Ethyl (B) <11  0 - 11 mg/dL  CBC      Component Value Range   WBC 9.0  4.0 - 10.5 K/uL   RBC 4.78  4.22 - 5.81 MIL/uL   Hemoglobin 16.6  13.0 - 17.0 g/dL   HCT 29.5  28.4 - 13.2 %   MCV 95.2  78.0 - 100.0 fL   MCH 34.7 (*) 26.0 - 34.0 pg   MCHC 36.5 (*) 30.0 - 36.0 g/dL   RDW 44.0  10.2 - 72.5 %   Platelets 286  150 - 400 K/uL  COMPREHENSIVE  METABOLIC PANEL      Component Value Range   Sodium 135  135 - 145 mEq/L   Potassium 3.5  3.5 - 5.1 mEq/L   Chloride 101  96 - 112 mEq/L   CO2 25  19 - 32 mEq/L   Glucose, Bld 102 (*) 70 - 99 mg/dL   BUN 7  6 - 23 mg/dL   Creatinine, Ser 9.60  0.50 - 1.35 mg/dL   Calcium 8.9  8.4 - 45.4 mg/dL   Total Protein 6.9  6.0 - 8.3 g/dL   Albumin 3.7  3.5 - 5.2 g/dL   AST 67 (*) 0 - 37 U/L   ALT 48  0 - 53 U/L   Alkaline Phosphatase 65  39 - 117 U/L   Total Bilirubin 0.3  0.3 - 1.2 mg/dL   GFR calc non Af Amer 86 (*) >90 mL/min   GFR calc Af Amer >90  >90 mL/min    Date: 10/15/2012  Rate: 84  Rhythm: normal sinus rhythm  QRS Axis: normal  Intervals: normal  ST/T Wave abnormalities: normal  Conduction Disutrbances:none  Narrative Interpretation: Normal ECG. When compared with ECG of Apr 27 2012, nonspecific  ST and T changes have resolved.  Old EKG Reviewed: changes noted    1. Seizure       MDM  Recurrent seizure. There no anticonvulsant levels to be checked. There is no evidence of any head injury, so CT scanning is not indicated. He will be observed in the emergency department to insure return to baseline mentation.  He stayed somnolent through most of the night, but now is awake, alert, oriented and has completely returned to normal neurologic status. He will be discharged with instructions to follow up with his primary care physician.        Dione Booze, MD 10/15/12 0981  Dione Booze, MD 10/15/12 [phone]

## 2013-01-27 ENCOUNTER — Encounter (HOSPITAL_COMMUNITY): Payer: Self-pay | Admitting: *Deleted

## 2013-01-27 ENCOUNTER — Emergency Department (HOSPITAL_COMMUNITY): Payer: Medicaid Other

## 2013-01-27 ENCOUNTER — Emergency Department (HOSPITAL_COMMUNITY)
Admission: EM | Admit: 2013-01-27 | Discharge: 2013-01-27 | Disposition: A | Discharge status: Home / Self Care | Payer: Medicaid Other | Attending: Emergency Medicine | Admitting: Emergency Medicine

## 2013-01-27 DIAGNOSIS — M549 Dorsalgia, unspecified: Secondary | ICD-10-CM

## 2013-01-27 DIAGNOSIS — R6250 Unspecified lack of expected normal physiological development in childhood: Secondary | ICD-10-CM | POA: Insufficient documentation

## 2013-01-27 DIAGNOSIS — M545 Low back pain, unspecified: Secondary | ICD-10-CM | POA: Insufficient documentation

## 2013-01-27 DIAGNOSIS — S88119A Complete traumatic amputation at level between knee and ankle, unspecified lower leg, initial encounter: Secondary | ICD-10-CM | POA: Insufficient documentation

## 2013-01-27 DIAGNOSIS — F17200 Nicotine dependence, unspecified, uncomplicated: Secondary | ICD-10-CM | POA: Insufficient documentation

## 2013-01-27 DIAGNOSIS — G40909 Epilepsy, unspecified, not intractable, without status epilepticus: Secondary | ICD-10-CM | POA: Insufficient documentation

## 2013-01-27 LAB — URINALYSIS, ROUTINE W REFLEX MICROSCOPIC
Ketones, ur: NEGATIVE mg/dL
Leukocytes, UA: NEGATIVE
Nitrite: NEGATIVE
Protein, ur: NEGATIVE mg/dL
Urobilinogen, UA: 0.2 mg/dL (ref 0.0–1.0)
pH: 5.5 (ref 5.0–8.0)

## 2013-01-27 LAB — POCT I-STAT, CHEM 8
BUN: 9 mg/dL (ref 6–23)
Chloride: 111 mEq/L (ref 96–112)
Creatinine, Ser: 1.2 mg/dL (ref 0.50–1.35)
Potassium: 3.7 mEq/L (ref 3.5–5.1)
Sodium: 147 mEq/L — ABNORMAL HIGH (ref 135–145)

## 2013-01-27 LAB — RAPID URINE DRUG SCREEN, HOSP PERFORMED: Benzodiazepines: NOT DETECTED

## 2013-01-27 MED ORDER — DIAZEPAM 5 MG PO TABS: 10.0000 mg | ORAL_TABLET | Freq: Once | ORAL | Status: DC

## 2013-01-27 MED ORDER — OXYCODONE-ACETAMINOPHEN 5-325 MG PO TABS: 1.0000 | ORAL_TABLET | Freq: Once | ORAL | Status: DC

## 2013-01-27 MED ORDER — IBUPROFEN 400 MG PO TABS: 400.0000 mg | ORAL_TABLET | Freq: Once | ORAL | Status: DC

## 2013-01-27 MED ORDER — MELOXICAM 15 MG PO TABS: 15.0000 mg | ORAL_TABLET | Freq: Every day | ORAL | Status: DC

## 2013-01-27 NOTE — ED Notes (Signed)
Pt reports no pain at this time.  Requesting discharge.  MD notified

## 2013-01-27 NOTE — ED Notes (Signed)
Pt. Uncooperative for x-rays and other dx. Tests to be performed. He justs wants the dr. To tell him what is going on with him.  We are not to give him meds. Until he cooperates.

## 2013-01-27 NOTE — ED Provider Notes (Signed)
History     CSN: [id number]  Arrival date & time 01/27/13  0411   First MD Initiated Contact with Patient 01/27/13 0430      Chief Complaint  Patient presents with  . Back Pain    (Consider location/radiation/quality/duration/timing/severity/associated sxs/prior treatment) HPI Comments: [name] is a 32 y.o. male w a hx of mild MR, Left BKA, increased falls and alcohol abuse that presents to the ER c/o back pain. Pt is a poor historian and it is difficult to obtain a history as the story keeps changing. Pt reports that his left lumbar region, left hip and left thigh are painful with intermittent numbness that have been interfering with his ability to ambulate x 1 week. Later he states that he had a fall yesterday that was the source of the problems. Pt is unable to describe pain and repeatedly says "come here so I can show you." Pain radiates from lumbar down back of left thigh. Severity rated at 10/10. He denies any saddle paresthesia, loss control of bowel or bladder, fevers, weight loss.   The history is provided by the patient and medical records.    Past Medical History  Diagnosis Date  . Seizures     Past Surgical History  Procedure Laterality Date  . Below the knee amputation    . Below knee leg amputation      LEFT    No family history on file.  History  Substance Use Topics  . Smoking status: Current Every Day Smoker -- 0.33 packs/day for 20 years    Types: Cigarettes  . Smokeless tobacco: Never Used  . Alcohol Use: Yes     Comment: not every day      Review of Systems  All other systems reviewed and are negative.    Allergies  Review of patient's allergies indicates no known allergies.  Home Medications   Current Outpatient Rx  Name  Route  Sig  Dispense  Refill  . potassium chloride (K-DUR) 10 MEQ tablet   Oral   Take 4 tablets (40 mEq total) by mouth daily.   8 tablet   0     BP 106/70  Pulse 106  Temp(Src) 0 F (-17.8 C)  SpO2  98%  Physical Exam  Nursing note and vitals reviewed. Constitutional: He is oriented to person, place, and time. He appears well-developed and well-nourished. He appears distressed.  HENT:  Head: Normocephalic and atraumatic.  Eyes: Conjunctivae and EOM are normal.  Neck: Normal range of motion.  Pulmonary/Chest: Effort normal.  Abdominal:  Soft, non pulsatile aorta   Musculoskeletal: Normal range of motion.  Para lumbar ttp, left trochanteric tenderness, pain with internal and external rotation of left extremity. LBKA.   Neurological: He is alert and oriented to person, place, and time.  Pt unable to ambulate d/t pain  Skin: Skin is warm and dry. No rash noted. He is not diaphoretic.  Psychiatric: He has a normal mood and affect. His behavior is normal.    ED Course  Procedures (including critical care time)  Labs Reviewed  URINALYSIS, ROUTINE W REFLEX MICROSCOPIC   No results found.   No diagnosis found.    MDM  Back & hip pain, acute on chronic  Pt is seen and examined; Initial history and physical, pain meds ordered. Labs and urine tests have been ordered and pending. Disposition will be pending lab studies and reassessment. Will be signed out to the oncoming provider. Pt has been non compliant throughout hospital  stay up to this time and has been refusing imaging, blood wk. Care resumed by Franciso Bend, PA-C 01/27/13 [phone]

## 2013-01-27 NOTE — ED Notes (Signed)
Pt. Uncooperative during x-ray procedures. Returned from x-ray. Mid level to be made aware.

## 2013-01-27 NOTE — ED Provider Notes (Signed)
6:23 AM BP 106/70  Pulse 106  Temp(Src) 0 F (-17.8 C)  SpO2 98% Assumed care of the patient form PA Paz. Patient is a poor historian. History is limited by the mental capacity of the patient, patient's ability to communicate effectively, and overall poor insight.  Patient has been beligerant and non-compliant with treatment.  He was also extremely rude to nursing staff.  I spoke with the patient and stated that if he would like help with his pain he would have to comply with our requests for work up and be kind to the nursing staff. Patient Urine is cocaine positive.  He states that his pain has gone away.  Patient will need follow up with PCP and ortho.  Will give meloxicam and follow up.    Back exam: tenderness noted  Lumbar paraspinals and si joints Left BKA no signs of infection. Patient able to move with ease when asked to turn in the bed.   At this time there does not appear to be any evidence of an acute emergency medical condition and the patient appears stable for discharge with appropriate outpatient follow up.Diagnosis was discussed with patient who verbalizes understanding and is agreeable to discharge.   Arthor Captain, PA-C 01/27/13 [phone]

## 2013-01-27 NOTE — ED Provider Notes (Signed)
Medical screening examination/treatment/procedure(s) were performed by non-physician practitioner and as supervising physician I was immediately available for consultation/collaboration.   Kwame Ryland L Sahvannah Rieser, MD 01/27/13 0657 

## 2013-01-28 NOTE — ED Provider Notes (Signed)
Medical screening examination/treatment/procedure(s) were performed by non-physician practitioner and as supervising physician I was immediately available for consultation/collaboration.   Shelda Jakes, MD 01/28/13 [phone]

## 2013-05-15 ENCOUNTER — Encounter (HOSPITAL_COMMUNITY): Payer: Self-pay | Admitting: Emergency Medicine

## 2013-05-15 ENCOUNTER — Emergency Department (HOSPITAL_COMMUNITY)
Admission: EM | Admit: 2013-05-15 | Discharge: 2013-05-15 | Disposition: A | Discharge status: Home / Self Care | Payer: Medicaid Other | Attending: Emergency Medicine | Admitting: Emergency Medicine

## 2013-05-15 ENCOUNTER — Emergency Department (HOSPITAL_COMMUNITY): Payer: Medicaid Other

## 2013-05-15 DIAGNOSIS — S139XXA Sprain of joints and ligaments of unspecified parts of neck, initial encounter: Secondary | ICD-10-CM

## 2013-05-15 DIAGNOSIS — S53402A Unspecified sprain of left elbow, initial encounter: Secondary | ICD-10-CM

## 2013-05-15 DIAGNOSIS — IMO0002 Reserved for concepts with insufficient information to code with codable children: Secondary | ICD-10-CM | POA: Insufficient documentation

## 2013-05-15 DIAGNOSIS — S88119A Complete traumatic amputation at level between knee and ankle, unspecified lower leg, initial encounter: Secondary | ICD-10-CM | POA: Insufficient documentation

## 2013-05-15 DIAGNOSIS — Z8669 Personal history of other diseases of the nervous system and sense organs: Secondary | ICD-10-CM | POA: Insufficient documentation

## 2013-05-15 DIAGNOSIS — Y9389 Activity, other specified: Secondary | ICD-10-CM | POA: Insufficient documentation

## 2013-05-15 DIAGNOSIS — F1010 Alcohol abuse, uncomplicated: Secondary | ICD-10-CM | POA: Insufficient documentation

## 2013-05-15 DIAGNOSIS — Z8673 Personal history of transient ischemic attack (TIA), and cerebral infarction without residual deficits: Secondary | ICD-10-CM | POA: Insufficient documentation

## 2013-05-15 DIAGNOSIS — W108XXA Fall (on) (from) other stairs and steps, initial encounter: Secondary | ICD-10-CM | POA: Insufficient documentation

## 2013-05-15 DIAGNOSIS — Y92009 Unspecified place in unspecified non-institutional (private) residence as the place of occurrence of the external cause: Secondary | ICD-10-CM | POA: Insufficient documentation

## 2013-05-15 DIAGNOSIS — F17200 Nicotine dependence, unspecified, uncomplicated: Secondary | ICD-10-CM | POA: Insufficient documentation

## 2013-05-15 HISTORY — DX: Cerebral infarction, unspecified: I63.9

## 2013-05-15 HISTORY — DX: Alcohol abuse, uncomplicated: F10.10

## 2013-05-15 NOTE — ED Notes (Signed)
PA at bedside.

## 2013-05-15 NOTE — ED Provider Notes (Signed)
Medical screening examination/treatment/procedure(s) were performed by non-physician practitioner and as supervising physician I was immediately available for consultation/collaboration.  Olivia Mackie, MD 05/15/13 [phone]

## 2013-05-15 NOTE — ED Provider Notes (Signed)
History     CSN: [id number]  Arrival date & time 05/15/13  0029   None     Chief Complaint  Patient presents with  . Fall    (Consider location/radiation/quality/duration/timing/severity/associated sxs/prior treatment) HPI History provided by pt.   Pt a poor historian and is uncooperative.  Reports that he slipped with his prosthetic leg on a family member's porch 2 days ago and fell down 4-5 steps.  He was intoxicated at the time.  Does not believe he hit his head.  Has had pain from his neck, all the way down the left side of his body to his stump.  Has been in bed since the fall.  No relief w/ advil.  No associated extremity weakness/paresthesias.  Had only one beer tonight. Past Medical History  Diagnosis Date  . Seizures   . Stroke   . Alcohol abuse     Past Surgical History  Procedure Laterality Date  . Below the knee amputation    . Below knee leg amputation      LEFT    No family history on file.  History  Substance Use Topics  . Smoking status: Current Every Day Smoker -- 0.33 packs/day for 20 years    Types: Cigarettes  . Smokeless tobacco: Never Used  . Alcohol Use: Yes     Comment: not every day      Review of Systems  All other systems reviewed and are negative.    Allergies  Review of patient's allergies indicates no known allergies.  Home Medications   Current Outpatient Rx  Name  Route  Sig  Dispense  Refill  . meloxicam (MOBIC) 15 MG tablet   Oral   Take 1 tablet (15 mg total) by mouth daily.   10 tablet   0   . EXPIRED: potassium chloride (K-DUR) 10 MEQ tablet   Oral   Take 4 tablets (40 mEq total) by mouth daily.   8 tablet   0     BP 130/61  Pulse 78  Temp(Src) 98.7 F (37.1 C) (Oral)  Resp 16  SpO2 98%  Physical Exam  Nursing note and vitals reviewed. Constitutional: He is oriented to person, place, and time. He appears well-developed and well-nourished. No distress.  HENT:  Head: Normocephalic and atraumatic.   Eyes:  Normal appearance  Neck: Normal range of motion.  Cardiovascular: Normal rate and regular rhythm.   Pulmonary/Chest: Effort normal and breath sounds normal. No respiratory distress.  Musculoskeletal: Normal range of motion.  No ecchymosis or abrasions to back.  Entire back is ttp w/ guarding.  Pt is glaring at me, gritting his teeth and telling me to stop, because he has already told me where the pain is located.  Mid-line no worse than soft tissues. Abrasion to lateral aspect L elbow. Tenderness of entire L shoulder and elbow.  Pain w/ passive ROM L shoulder, elbow and wrist.  2+ radial pulses.  Ambulatory.   Neurological: He is alert and oriented to person, place, and time.  Skin: Skin is warm and dry. No rash noted.  Psychiatric: He has a normal mood and affect. His behavior is normal.    ED Course  Procedures (including critical care time)  Labs Reviewed - No data to display Dg Cervical Spine Complete  05/15/2013   *RADIOLOGY REPORT*  Clinical Data: History of trauma from a fall complaining of neck pain.  CERVICAL SPINE - COMPLETE 4+ VIEW  Comparison: No priors.  Findings: Five views of  the cervical spine demonstrate no acute displaced cervical spine fractures.  Alignment is anatomic. Prevertebral soft tissues are normal.  IMPRESSION: 1.  No acute radiographic abnormality of the cervical spine.   Original Report Authenticated By: Trudie Reed, M.D.   Dg Thoracic Spine 2 View  05/15/2013   *RADIOLOGY REPORT*  Clinical Data: History of fall.  Back pain.  THORACIC SPINE - 2 VIEW  Comparison: CT of the thorax 04/27/2012.  Findings: The three views of the thoracic spine demonstrate no acute displaced fracture or definite compression type fracture. Alignment is anatomic.  IMPRESSION: 1.  No acute radiographic abnormality of the thoracic spine.   Original Report Authenticated By: Trudie Reed, M.D.   Dg Lumbar Spine Complete  05/15/2013   *RADIOLOGY REPORT*  Clinical Data: History of  recent fall complaining of back pain.  LUMBAR SPINE - COMPLETE 4+ VIEW  Comparison: Lumbar spine radiograph 01/27/2013.  Findings: Five views of the lumbar spine demonstrate no definite acute displaced fracture or compression type fracture.  Alignment is anatomic.  No defects of the pars interarticularis are noted.  IMPRESSION: 1.  No acute radiographic abnormality of the lumbar spine.   Original Report Authenticated By: Trudie Reed, M.D.   Dg Elbow Complete Left  05/15/2013   *RADIOLOGY REPORT*  Clinical Data: History of fall complaining of left elbow pain.  LEFT ELBOW - COMPLETE 3+ VIEW  Comparison: No priors.  Findings: Four views of the left elbow demonstrate some soft tissue swelling posterior to the proximal ulna.  No acute displaced fracture, subluxation or dislocation.  IMPRESSION: 1.  Soft tissue swelling posterior to the proximal left ulna.  No evidence of underlying bony trauma.   Original Report Authenticated By: Trudie Reed, M.D.     1. Sprain of left elbow, initial encounter   2. 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  No associated extremity weakness/parethesias.  Ambulatory.  Exam is limited because patient uncooperative and his behavior threatening, but has tenderness of entire back, including mid-line w/ guarding.  Xrays of spine as well as L elbow obtained and are unremarkable.  Doubt shoulder fx/dislocation; painful but smooth ROM w/out guarding. All results discussed w/ pt.  He received a shoulder sling for comfort.  He has a PCP and orthopedist to f/u with if necessary.  Recommended tylenol/ibuprofen for pain.  Return precautions discussed.         Otilio Miu, PA-C 05/15/13 [phone]

## 2013-05-15 NOTE — ED Notes (Signed)
PT. ARRIVED WITH EMS FROM HOME REPORTS LEFT ELBOW PAIN / FELL AT HOME 2 DAYS AGO , NO LOC /AMBULATORY . DRANK ETOH THIS EVENING .

## 2013-05-15 NOTE — ED Notes (Signed)
Patient is in xray,  erpa aware of complaints of stomach pain.

## 2014-01-17 ENCOUNTER — Encounter (HOSPITAL_COMMUNITY): Payer: Self-pay | Admitting: Emergency Medicine

## 2014-01-17 ENCOUNTER — Emergency Department (HOSPITAL_COMMUNITY)
Admission: EM | Admit: 2014-01-17 | Discharge: 2014-01-17 | Discharge status: Against Medical Advice | Payer: Self-pay | Attending: Emergency Medicine | Admitting: Emergency Medicine

## 2014-01-17 ENCOUNTER — Emergency Department (HOSPITAL_COMMUNITY)
Admission: EM | Admit: 2014-01-17 | Discharge: 2014-01-17 | Disposition: A | Discharge status: Home / Self Care | Payer: Medicaid Other | Attending: Emergency Medicine | Admitting: Emergency Medicine

## 2014-01-17 DIAGNOSIS — R040 Epistaxis: Secondary | ICD-10-CM | POA: Insufficient documentation

## 2014-01-17 DIAGNOSIS — F1010 Alcohol abuse, uncomplicated: Secondary | ICD-10-CM | POA: Insufficient documentation

## 2014-01-17 DIAGNOSIS — Z8669 Personal history of other diseases of the nervous system and sense organs: Secondary | ICD-10-CM | POA: Insufficient documentation

## 2014-01-17 DIAGNOSIS — F17200 Nicotine dependence, unspecified, uncomplicated: Secondary | ICD-10-CM | POA: Insufficient documentation

## 2014-01-17 DIAGNOSIS — Z8673 Personal history of transient ischemic attack (TIA), and cerebral infarction without residual deficits: Secondary | ICD-10-CM | POA: Insufficient documentation

## 2014-01-17 NOTE — ED Notes (Signed)
Patient becoming verbally aggressive with staff---refusing for ID band to be placed Security called

## 2014-01-17 NOTE — ED Notes (Signed)
Patient leaving--being taken home in cab Dr. Kathrynn Humble aware

## 2014-01-17 NOTE — ED Notes (Signed)
Patient arrives via EMS after his mother called Patient with c/o nose bleed x 3 days Patient states that he went through three shirts--no blood on face, in nares, on clothes Patient appears intoxicated--slurring words Patient in NAD 

## 2014-01-17 NOTE — Progress Notes (Signed)
[facility] did not get to see patient but will be sending information about the [facility] [employer] Card program, using the address provided.  °

## 2014-01-17 NOTE — ED Notes (Signed)
Patient now agrees to have ID band placed, but repeatedly states "I don't want no medicine."

## 2014-01-17 NOTE — ED Notes (Addendum)
Patient arrives via EMS after his mother called Patient with c/o nose bleed x 3 days Patient states that he went through three shirts--no blood on face, in nares, on clothes Patient appears intoxicated--slurring words Patient in NAD

## 2014-01-18 NOTE — ED Provider Notes (Signed)
CSN: [id number]     Arrival date & time 01/17/14  0112 History   First MD Initiated Contact with Patient 01/17/14 0157     Chief Complaint  Patient presents with  . Alcohol Intoxication   (Consider location/radiation/quality/duration/timing/severity/associated sxs/prior Treatment) HPI Comments: Pt comes to the ED with cc of nose bleed. He is intoxicated, admits to drinking - but states that he comes to the ED with nasal bleed. No bleed currently. He denies trauma, no n/v/f/c. Pt has hx of seizures - doesn't take any seizure meds.  Patient is a 33 y.o. male presenting with intoxication. The history is provided by the patient.  Alcohol Intoxication Pertinent negatives include no chest pain, no abdominal pain and no shortness of breath.    Past Medical History  Diagnosis Date  . Seizures   . Stroke   . Alcohol abuse    Past Surgical History  Procedure Laterality Date  . Below the knee amputation    . Below knee leg amputation      LEFT   History reviewed. No pertinent family history. History  Substance Use Topics  . Smoking status: Current Every Day Smoker -- 0.33 packs/day for 20 years    Types: Cigarettes  . Smokeless tobacco: Never Used  . Alcohol Use: Yes     Comment: not every day    Review of Systems  Constitutional: Negative for activity change.  HENT: Positive for nosebleeds.   Respiratory: Negative for cough and shortness of breath.   Cardiovascular: Negative for chest pain.  Gastrointestinal: Negative for nausea, vomiting and abdominal pain.  Hematological: Does not bruise/bleed easily.    Allergies  Review of patient's allergies indicates no known allergies.  Home Medications   Current Outpatient Rx  Name  Route  Sig  Dispense  Refill  . EXPIRED: potassium chloride (K-DUR) 10 MEQ tablet   Oral   Take 4 tablets (40 mEq total) by mouth daily.   8 tablet   0    BP 150/84  Pulse 74  Temp(Src) 98.5 F (36.9 C) (Oral)  Resp 20  SpO2 100% Physical  Exam  Nursing note and vitals reviewed. Constitutional: He appears well-developed.  HENT:  Head: Normocephalic and atraumatic.  No active nasal bleed, no tongue biting  Eyes: Conjunctivae are normal.  Cardiovascular: Normal rate.   Abdominal: There is no tenderness.  Skin: Skin is warm.    ED Course  Procedures (including critical care time) Labs Review Labs Reviewed - No data to display Imaging Review No results found.  EKG Interpretation   None       MDM  No diagnosis found.  Pt comes in with cc of nasal bleed. He has no active bleed- there is some dry blood in both nare. PT is ao x 3 but intoxicated. Pt denies nausea, emesis, fevers, chills, chest pains, shortness of breath, headaches, abdominal pain, uti like symptoms. Came from home via EMS.  Pt wants to go home when i reported that the nasal bleed has ceased and there is nothing further to do. He wants to go home, but he was asked not to leave unless he was picked up or a can was called.  Pt shows good judgement, and agreed to that plan. He was going to call his sister, or call a cab.   Varney Biles, MD 01/18/14 [phone]

## 2015-12-07 ENCOUNTER — Emergency Department (HOSPITAL_COMMUNITY)
Admission: EM | Admit: 2015-12-07 | Discharge: 2015-12-07 | Disposition: A | Discharge status: Home / Self Care | Payer: Medicaid Other | Attending: Emergency Medicine | Admitting: Emergency Medicine

## 2015-12-07 ENCOUNTER — Encounter (HOSPITAL_COMMUNITY): Payer: Self-pay | Admitting: *Deleted

## 2015-12-07 ENCOUNTER — Emergency Department (HOSPITAL_COMMUNITY): Payer: Medicaid Other

## 2015-12-07 DIAGNOSIS — F10920 Alcohol use, unspecified with intoxication, uncomplicated: Secondary | ICD-10-CM

## 2015-12-07 DIAGNOSIS — F10120 Alcohol abuse with intoxication, uncomplicated: Secondary | ICD-10-CM | POA: Insufficient documentation

## 2015-12-07 DIAGNOSIS — S0003XA Contusion of scalp, initial encounter: Secondary | ICD-10-CM | POA: Diagnosis not present

## 2015-12-07 DIAGNOSIS — S199XXA Unspecified injury of neck, initial encounter: Secondary | ICD-10-CM | POA: Diagnosis not present

## 2015-12-07 DIAGNOSIS — S0990XA Unspecified injury of head, initial encounter: Secondary | ICD-10-CM | POA: Diagnosis present

## 2015-12-07 DIAGNOSIS — W108XXA Fall (on) (from) other stairs and steps, initial encounter: Secondary | ICD-10-CM | POA: Diagnosis not present

## 2015-12-07 DIAGNOSIS — S0001XA Abrasion of scalp, initial encounter: Secondary | ICD-10-CM | POA: Diagnosis not present

## 2015-12-07 DIAGNOSIS — Y9389 Activity, other specified: Secondary | ICD-10-CM | POA: Insufficient documentation

## 2015-12-07 DIAGNOSIS — Y9289 Other specified places as the place of occurrence of the external cause: Secondary | ICD-10-CM | POA: Diagnosis not present

## 2015-12-07 DIAGNOSIS — F17210 Nicotine dependence, cigarettes, uncomplicated: Secondary | ICD-10-CM | POA: Diagnosis not present

## 2015-12-07 DIAGNOSIS — S060X9A Concussion with loss of consciousness of unspecified duration, initial encounter: Secondary | ICD-10-CM | POA: Insufficient documentation

## 2015-12-07 DIAGNOSIS — Z8673 Personal history of transient ischemic attack (TIA), and cerebral infarction without residual deficits: Secondary | ICD-10-CM | POA: Diagnosis not present

## 2015-12-07 DIAGNOSIS — W19XXXA Unspecified fall, initial encounter: Secondary | ICD-10-CM

## 2015-12-07 DIAGNOSIS — Y998 Other external cause status: Secondary | ICD-10-CM | POA: Insufficient documentation

## 2015-12-07 DIAGNOSIS — Z79899 Other long term (current) drug therapy: Secondary | ICD-10-CM | POA: Diagnosis not present

## 2015-12-07 LAB — CBG MONITORING, ED: Glucose-Capillary: 82 mg/dL (ref 65–99)

## 2015-12-07 NOTE — Discharge Instructions (Signed)
Alcohol Intoxication Alcohol intoxication occurs when you drink enough alcohol that it affects your ability to function. It can be mild or very severe. Drinking a lot of alcohol in a short time is called binge drinking. This can be very harmful. Drinking alcohol can also be more dangerous if you are taking medicines or other drugs. Some of the effects caused by alcohol may include:  Loss of coordination.  Changes in mood and behavior.  Unclear thinking.  Trouble talking (slurred speech).  Throwing up (vomiting).  Confusion.  Slowed breathing.  Twitching and shaking (seizures).  Loss of consciousness. HOME CARE  Do not drive after drinking alcohol.  Drink enough water and fluids to keep your pee (urine) clear or pale yellow. Avoid caffeine.  Only take medicine as told by your doctor. GET HELP IF:  You throw up (vomit) many times.  You do not feel better after a few days.  You frequently have alcohol intoxication. Your doctor can help decide if you should see a substance use treatment counselor. GET HELP RIGHT AWAY IF:  You become shaky when you stop drinking.  You have twitching and shaking.  You throw up blood. It may look bright red or like coffee grounds.  You notice blood in your poop (bowel movements).  You become lightheaded or pass out (faint). MAKE SURE YOU:   Understand these instructions.  Will watch your condition.  Will get help right away if you are not doing well or get worse.   This information is not intended to replace advice given to you by your health care provider. Make sure you discuss any questions you have with your health care provider.   Document Released: 05/17/2008 Document Revised: 08/01/2013 Document Reviewed: 05/04/2013 Elsevier Interactive Patient Education 2016 [place] A contusion is a deep bruise. Contusions happen when an injury causes bleeding under the skin. Symptoms of bruising include pain, swelling, and  discolored skin. The skin may turn blue, purple, or yellow. HOME CARE   Rest the injured area.  If told, put ice on the injured area.  Put ice in a plastic bag.  Place a towel between your skin and the bag.  Leave the ice on for 20 minutes, 2-3 times per day.  If told, put light pressure (compression) on the injured area using an elastic bandage. Make sure the bandage is not too tight. Remove it and put it back on as told by your doctor.  If possible, raise (elevate) the injured area above the level of your heart while you are sitting or lying down.  Take over-the-counter and prescription medicines only as told by your doctor. GET HELP IF:  Your symptoms do not get better after several days of treatment.  Your symptoms get worse.  You have trouble moving the injured area. GET HELP RIGHT AWAY IF:   You have very bad pain.  You have a loss of feeling (numbness) in a hand or foot.  Your hand or foot turns pale or cold.   This information is not intended to replace advice given to you by your health care provider. Make sure you discuss any questions you have with your health care provider.   Document Released: 05/17/2008 Document Revised: 08/20/2015 Document Reviewed: 04/16/2015 Elsevier Interactive Patient Education 2016 [place].  Facial or Scalp Contusion A facial or scalp contusion is a deep bruise on the face or head. Injuries to the face and head generally cause a lot of swelling, especially around the eyes.  Contusions are the result of an injury that caused bleeding under the skin. The contusion may turn blue, purple, or yellow. Minor injuries will give you a painless contusion, but more severe contusions may stay painful and swollen for a few weeks.  CAUSES  A facial or scalp contusion is caused by a blunt injury or trauma to the face or head area.  SIGNS AND SYMPTOMS   Swelling of the injured area.   Discoloration of the injured area.   Tenderness,  soreness, or pain in the injured area.  DIAGNOSIS  The diagnosis can be made by taking a medical history and doing a physical exam. An X-ray exam, CT scan, or MRI may be needed to determine if there are any associated injuries, such as broken bones (fractures). TREATMENT  Often, the best treatment for a facial or scalp contusion is applying cold compresses to the injured area. Over-the-counter medicines may also be recommended for pain control.  HOME CARE INSTRUCTIONS   Only take over-the-counter or prescription medicines as directed by your health care provider.   Apply ice to the injured area.   Put ice in a plastic bag.   Place a towel between your skin and the bag.   Leave the ice on for 20 minutes, 2-3 times a day.  SEEK MEDICAL CARE IF:  You have bite problems.   You have pain with chewing.   You are concerned about facial defects. SEEK IMMEDIATE MEDICAL CARE IF:  You have severe pain or a headache that is not relieved by medicine.   You have unusual sleepiness, confusion, or personality changes.   You throw up (vomit).   You have a persistent nosebleed.   You have double vision or blurred vision.   You have fluid drainage from your nose or ear.   You have difficulty walking or using your arms or legs.  MAKE SURE YOU:   Understand these instructions.  Will watch your condition.  Will get help right away if you are not doing well or get worse.   This information is not intended to replace advice given to you by your health care provider. Make sure you discuss any questions you have with your health care provider.   Document Released: 01/06/2005 Document Revised: 12/20/2014 Document Reviewed: 07/12/2013 Elsevier Interactive Patient Education 2016 [employer]  Hematoma A hematoma is a collection of blood. The collection of blood can turn into a hard, painful lump under the skin. Your skin may turn blue or yellow if the hematoma is close to the  surface of the skin. Most hematomas get better in a few days to weeks. Some hematomas are serious and need medical care. Hematomas can be very small or very big. HOME CARE  Apply ice to the injured area:  Put ice in a plastic bag.  Place a towel between your skin and the bag.  Leave the ice on for 20 minutes, 2-3 times a day for the first 1 to 2 days.  After the first 2 days, switch to using warm packs on the injured area.  Raise (elevate) the injured area to lessen pain and puffiness (swelling). You may also wrap the area with an elastic bandage. Make sure the bandage is not wrapped too tight.  If you have a painful hematoma on your leg or foot, you may use crutches for a couple days.  Only take medicines as told by your doctor. GET HELP RIGHT AWAY IF:   Your pain gets worse.  Your  pain is not controlled with medicine.  You have a fever.  Your puffiness gets worse.  Your skin turns more blue or yellow.  Your skin over the hematoma breaks or starts bleeding.  Your hematoma is in your chest or belly (abdomen) and you are short of breath, feel weak, or have a change in consciousness.  Your hematoma is on your scalp and you have a headache that gets worse or a change in alertness or consciousness. MAKE SURE YOU:   Understand these instructions.  Will watch your condition.  Will get help right away if you are not doing well or get worse.   This information is not intended to replace advice given to you by your health care provider. Make sure you discuss any questions you have with your health care provider.   Document Released: 01/06/2005 Document Revised: 08/01/2013 Document Reviewed: 05/09/2013 Elsevier Interactive Patient Education 2016 [place] Injury, Adult You have a head injury. Headaches and throwing up (vomiting) are common after a head injury. It should be easy to wake up from sleeping. Sometimes you must stay in the hospital. Most problems happen  within the first 24 hours. Side effects may occur up to 7-10 days after the injury.  WHAT ARE THE TYPES OF HEAD INJURIES? Head injuries can be as minor as a bump. Some head injuries can be more severe. More severe head injuries include:  A jarring injury to the brain (concussion).  A bruise of the brain (contusion). This mean there is bleeding in the brain that can cause swelling.  A cracked skull (skull fracture).  Bleeding in the brain that collects, clots, and forms a bump (hematoma). WHEN SHOULD I GET HELP RIGHT AWAY?   You are confused or sleepy.  You cannot be woken up.  You feel sick to your stomach (nauseous) or keep throwing up (vomiting).  Your dizziness or unsteadiness is getting worse.  You have very bad, lasting headaches that are not helped by medicine. Take medicines only as told by your doctor.  You cannot use your arms or legs like normal.  You cannot walk.  You notice changes in the black spots in the center of the colored part of your eye (pupil).  You have clear or bloody fluid coming from your nose or ears.  You have trouble seeing. During the next 24 hours after the injury, you must stay with someone who can watch you. This person should get help right away (call 911 in the [place] if you start to shake and are not able to control it (have seizures), you pass out, or you are unable to wake up. HOW CAN I PREVENT A HEAD INJURY IN THE FUTURE?  Wear seat belts.  Wear a helmet while bike riding and playing sports like football.  Stay away from dangerous activities around the house. WHEN CAN I RETURN TO NORMAL ACTIVITIES AND ATHLETICS? See your doctor before doing these activities. You should not do normal activities or play contact sports until 1 week after the following symptoms have stopped:  Headache that does not go away.  Dizziness.  Poor attention.  Confusion.  Memory problems.  Sickness to your stomach or throwing  up.  Tiredness.  Fussiness.  Bothered by bright lights or loud noises.  Anxiousness or depression.  Restless sleep. MAKE SURE YOU:   Understand these instructions.  Will watch your condition.  Will get help right away if you are not doing well or get worse.   This  information is not intended to replace advice given to you by your health care provider. Make sure you discuss any questions you have with your health care provider.   Document Released: 11/11/2008 Document Revised: 12/20/2014 Document Reviewed: 08/06/2013 Elsevier Interactive Patient Education [employer].

## 2015-12-07 NOTE — ED Notes (Signed)
Patient refuses to answer questions. Patient roommate states patient has had 11 beers and "quite a bit of liquor" states patient was walking down the stairs and tripped and fell. Lac noted to the posterior head. Patient alert and Oriented. Patient has abrasion to the right shoulder.

## 2015-12-07 NOTE — ED Notes (Addendum)
Pt here via [facility] for fall/ r/t etoh. Hematoma to L parietal and hematoma to shoulders.  Pt angry and belligerent in triage.  Pt denies hx of loc, though bystanders state pt did pass out.  Pt refused to let [facility] assess or take a bs.

## 2015-12-07 NOTE — ED Provider Notes (Signed)
CSN: [id number]     Arrival date & time 12/07/15  1241 History   First MD Initiated Contact with Patient 12/07/15 1537     Chief Complaint  Patient presents with  . Fall     (Consider location/radiation/quality/duration/timing/severity/associated sxs/prior Treatment) Patient is a 34 y.o. male presenting with fall. The history is provided by the patient and a friend.  Fall This is a new problem. The current episode started today. The problem occurs constantly. The problem has been gradually improving. Associated symptoms include headaches, myalgias and neck pain. Pertinent negatives include no abdominal pain, anorexia, arthralgias, change in bowel habit, chest pain, chills, congestion, coughing, diaphoresis, fatigue, fever, joint swelling, nausea, numbness, rash, sore throat, swollen glands, urinary symptoms, vertigo, visual change, vomiting or weakness. Nothing aggravates the symptoms. He has tried nothing for the symptoms. The treatment provided no relief.    Past Medical History  Diagnosis Date  . Seizures ([place])   . Stroke ([place])   . Alcohol abuse    Past Surgical History  Procedure Laterality Date  . Below the knee amputation    . Below knee leg amputation      LEFT   No family history on file. Social History  Substance Use Topics  . Smoking status: Current Every Day Smoker -- 0.33 packs/day for 20 years    Types: Cigarettes  . Smokeless tobacco: Never Used  . Alcohol Use: Yes     Comment: not every day    Review of Systems  Constitutional: Negative for fever, chills, diaphoresis and fatigue.  HENT: Negative for congestion and sore throat.   Eyes: Negative for pain and visual disturbance.  Respiratory: Negative for cough.   Cardiovascular: Negative for chest pain.  Gastrointestinal: Negative for nausea, vomiting, abdominal pain, anorexia and change in bowel habit.  Genitourinary: Negative for dysuria.  Musculoskeletal: Positive for myalgias and neck pain. Negative for  back pain, joint swelling, arthralgias, gait problem and neck stiffness.  Skin: Positive for wound. Negative for rash.  Neurological: Positive for headaches. Negative for dizziness, vertigo, facial asymmetry, speech difficulty, weakness, light-headedness and numbness.  Psychiatric/Behavioral: Negative for confusion.      Allergies  Review of patient's allergies indicates no known allergies.  Home Medications   Prior to Admission medications   Medication Sig Start Date End Date Taking? Authorizing Provider  albuterol (PROVENTIL HFA;VENTOLIN HFA) 108 (90 BASE) MCG/ACT inhaler Inhale 1 puff into the lungs every 6 (six) hours as needed for wheezing or shortness of breath.   Yes Historical Provider, MD  potassium chloride (K-DUR) 10 MEQ tablet Take 4 tablets (40 mEq total) by mouth daily. 05/03/12 05/05/13  Na Li, MD   BP 106/79 mmHg  Pulse 79  Temp(Src) 97.2 F (36.2 C) (Oral)  Resp 20  SpO2 100% Physical Exam  Constitutional: He is oriented to person, place, and time. He appears well-developed and well-nourished. He does not have a sickly appearance. He does not appear ill.  HENT:  Head: Head is with abrasion and with contusion. Head is without laceration, without right periorbital erythema and without left periorbital erythema.    Right Ear: No hemotympanum.  Left Ear: No hemotympanum.  Nose: No rhinorrhea, sinus tenderness or nasal deformity.  Mouth/Throat: Oropharynx is clear and moist and mucous membranes are normal.  Eyes: Conjunctivae and EOM are normal. Pupils are equal, round, and reactive to light.  Neck: Normal range of motion. Muscular tenderness present. No spinous process tenderness present. Normal range of motion present.  Cardiovascular: Regular  rhythm and normal heart sounds.  Exam reveals no decreased pulses.   No murmur heard. Pulmonary/Chest: Effort normal and breath sounds normal.  Abdominal: Normal appearance. There is no tenderness. There is no rigidity, no  guarding, no tenderness at McBurney's point and negative Murphy's sign.  Musculoskeletal:       Cervical back: He exhibits pain. He exhibits normal range of motion, no tenderness, no bony tenderness, no swelling, no edema, no deformity and no laceration.  Neurological: He is alert and oriented to person, place, and time. He has normal strength. No cranial nerve deficit or sensory deficit. Coordination normal. GCS eye subscore is 4. GCS verbal subscore is 5. GCS motor subscore is 6.  Skin: Abrasion and bruising noted. No laceration and no rash noted. No erythema.  Psychiatric: He has a normal mood and affect. His speech is normal and behavior is normal. Cognition and memory are impaired.    ED Course  Procedures (including critical care time) Labs Review Labs Reviewed  CBG MONITORING, ED    Imaging Review Ct Head Wo Contrast  12/07/2015  CLINICAL DATA:  Status post fall full plate porch today. Left parietal hematoma. Initial encounter. EXAM: CT HEAD WITHOUT CONTRAST CT CERVICAL SPINE WITHOUT CONTRAST TECHNIQUE: Multidetector CT imaging of the head and cervical spine was performed following the standard protocol without intravenous contrast. Multiplanar CT image reconstructions of the cervical spine were also generated. COMPARISON:  Head and cervical spine CT scans 03/22/2010. FINDINGS: CT HEAD FINDINGS The brain appears normal without hemorrhage, infarct, mass lesion, mass effect, midline shift or abnormal extra-axial fluid collection. No hydrocephalus or pneumocephalus. The calvarium is intact. There appear to be remote fractures of the medial walls of the orbits bilaterally, unchanged. No acute fracture is identified. Imaged paranasal sinuses show a small mucous retention cyst or polyp in the left maxillary. CT CERVICAL SPINE FINDINGS There is no fracture or malalignment of the cervical spine. Intervertebral disc space height is maintained. The facet joints are unremarkable. Lung apices are clear.  IMPRESSION: No acute abnormality head or cervical spine. Electronically Signed   By: Inge Rise M.D.   On: 12/07/2015 16:41   Ct Cervical Spine Wo Contrast  12/07/2015  CLINICAL DATA:  Status post fall full plate porch today. Left parietal hematoma. Initial encounter. EXAM: CT HEAD WITHOUT CONTRAST CT CERVICAL SPINE WITHOUT CONTRAST TECHNIQUE: Multidetector CT imaging of the head and cervical spine was performed following the standard protocol without intravenous contrast. Multiplanar CT image reconstructions of the cervical spine were also generated. COMPARISON:  Head and cervical spine CT scans 03/22/2010. FINDINGS: CT HEAD FINDINGS The brain appears normal without hemorrhage, infarct, mass lesion, mass effect, midline shift or abnormal extra-axial fluid collection. No hydrocephalus or pneumocephalus. The calvarium is intact. There appear to be remote fractures of the medial walls of the orbits bilaterally, unchanged. No acute fracture is identified. Imaged paranasal sinuses show a small mucous retention cyst or polyp in the left maxillary. CT CERVICAL SPINE FINDINGS There is no fracture or malalignment of the cervical spine. Intervertebral disc space height is maintained. The facet joints are unremarkable. Lung apices are clear. IMPRESSION: No acute abnormality head or cervical spine. Electronically Signed   By: Inge Rise M.D.   On: 12/07/2015 16:41   I have personally reviewed and evaluated these images and lab results as part of my medical decision-making.   EKG Interpretation None      MDM   Final diagnoses:  Fall, initial encounter  Scalp hematoma, initial  encounter  Alcohol intoxication, uncomplicated ([place])    34 year old African-American male with no significant past medical history presents in setting of fall. Per patient and friend patient had been drinking alcohol today when he lost balance and fell down several stairs. Patient struck left posterior portion of head and  had positive LOC. Unknown time of loss of consciousness. Patient was able to ambulate and get up on his after event but due to fall EMS was called. On arrival patient was hemodynamically stable. Neurovascularly intact. Patient is alert and oriented 4 and noted to have hemostatic abrasion to the left parieto-occipital area. Patient had mild tenderness to right side of neck. No midline tenderness to cervical thoracic or lumbar spine. Patient has a BKA on the left. Friend reports patient isn't mental status baseline but patient does smell of alcohol. Patient appears clinically sober on examination.  In setting of fall obtained CT head and C-spine to rule out significant intracerebral abnormality or fracture or malalignment.  CT head and C-spine did not reveal acute fracture, malalignment, intracerebral abnormality. No signs of hemorrhage. Patient continued to be clinically sober on reevaluation. Patient's pain improving. In setting this finding is likely mechanical fall in setting of alcohol intoxication and hematoma to scalp. Patient stable for discharge home at this time. Patient given strict return precautions and stable at time of discharge. Patient in agreement with plan. Patient advised to follow up PCP as needed for further management.  Attending has seen and evaluated patient and Dr. Roxanne Mins is in agreement with plan.    Esaw Grandchild, MD [phone]  Delora Fuel, MD [phone]

## 2016-02-24 ENCOUNTER — Emergency Department (HOSPITAL_COMMUNITY): Payer: Medicaid Other

## 2016-02-24 ENCOUNTER — Encounter (HOSPITAL_COMMUNITY): Payer: Self-pay | Admitting: Emergency Medicine

## 2016-02-24 ENCOUNTER — Emergency Department (HOSPITAL_COMMUNITY)
Admission: EM | Admit: 2016-02-24 | Discharge: 2016-02-24 | Disposition: A | Discharge status: Home / Self Care | Payer: Medicaid Other | Attending: Emergency Medicine | Admitting: Emergency Medicine

## 2016-02-24 DIAGNOSIS — Z79899 Other long term (current) drug therapy: Secondary | ICD-10-CM | POA: Diagnosis not present

## 2016-02-24 DIAGNOSIS — R569 Unspecified convulsions: Secondary | ICD-10-CM | POA: Diagnosis present

## 2016-02-24 DIAGNOSIS — Z8673 Personal history of transient ischemic attack (TIA), and cerebral infarction without residual deficits: Secondary | ICD-10-CM | POA: Insufficient documentation

## 2016-02-24 DIAGNOSIS — F17210 Nicotine dependence, cigarettes, uncomplicated: Secondary | ICD-10-CM | POA: Insufficient documentation

## 2016-02-24 LAB — COMPREHENSIVE METABOLIC PANEL
ALBUMIN: 3.6 g/dL (ref 3.5–5.0)
ALK PHOS: 133 U/L — AB (ref 38–126)
ALT: 107 U/L — AB (ref 17–63)
AST: 229 U/L — AB (ref 15–41)
Anion gap: 12 (ref 5–15)
BUN: 7 mg/dL (ref 6–20)
CALCIUM: 9.1 mg/dL (ref 8.9–10.3)
CHLORIDE: 96 mmol/L — AB (ref 101–111)
CO2: 25 mmol/L (ref 22–32)
CREATININE: 0.92 mg/dL (ref 0.61–1.24)
GFR calc Af Amer: >60 mL/min (ref >60)
GFR calc non Af Amer: >60 mL/min (ref >60)
GLUCOSE: 101 mg/dL — AB (ref 65–99)
Potassium: 4 mmol/L (ref 3.5–5.1)
SODIUM: 133 mmol/L — AB (ref 135–145)
Total Bilirubin: 2 mg/dL — ABNORMAL HIGH (ref 0.3–1.2)
Total Protein: 7.3 g/dL (ref 6.5–8.1)

## 2016-02-24 LAB — CBC WITH DIFFERENTIAL/PLATELET
Basophils Absolute: 0 10*3/uL (ref 0.0–0.1)
Basophils Relative: 0 %
EOS ABS: 0 10*3/uL (ref 0.0–0.7)
EOS PCT: 0 %
HEMATOCRIT: 41.7 % (ref 39.0–52.0)
Hemoglobin: 14.5 g/dL (ref 13.0–17.0)
LYMPHS ABS: 1.1 10*3/uL (ref 0.7–4.0)
LYMPHS PCT: 12 %
MCH: 34.8 pg — AB (ref 26.0–34.0)
MCHC: 34.8 g/dL (ref 30.0–36.0)
MCV: 100 fL (ref 78.0–100.0)
MONOS PCT: 10 %
Monocytes Absolute: 0.9 10*3/uL (ref 0.1–1.0)
Neutro Abs: 6.8 10*3/uL (ref 1.7–7.7)
Neutrophils Relative %: 77 %
PLATELETS: 55 10*3/uL — AB (ref 150–400)
RBC: 4.17 MIL/uL — AB (ref 4.22–5.81)
RDW: 14.2 % (ref 11.5–15.5)
WBC: 8.9 10*3/uL (ref 4.0–10.5)

## 2016-02-24 LAB — RAPID URINE DRUG SCREEN, HOSP PERFORMED
Amphetamines: NOT DETECTED
Barbiturates: NOT DETECTED
Benzodiazepines: NOT DETECTED
COCAINE: NOT DETECTED
OPIATES: NOT DETECTED
TETRAHYDROCANNABINOL: NOT DETECTED

## 2016-02-24 LAB — ETHANOL: Alcohol, Ethyl (B): <5 mg/dL (ref <5)

## 2016-02-24 NOTE — ED Notes (Signed)
Bed: WA17 Expected date:  Expected time:  Means of arrival:  Comments: EMS 35yo M seizure - postictal / ETOH

## 2016-02-24 NOTE — Discharge Instructions (Signed)
You should have an evaluation by a neurologist. I gave you the number of 2 different offices in [place].  Recheck if you have another seizure.
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
## 2016-02-24 NOTE — ED Notes (Signed)
Per EMS pt presents with seizure activity witnessed by family member. EMS advised pt was alert and oriented upon arrival but postictal on scene. 18ga IV established and NS Bolus started due to blood pressure of 81/59. PT reported to EMS use of ETOH today. Unknown if pt is currently taking seizure medications.

## 2016-06-04 ENCOUNTER — Other Ambulatory Visit: Payer: Self-pay

## 2016-06-04 ENCOUNTER — Encounter (HOSPITAL_COMMUNITY): Payer: Self-pay

## 2016-06-04 DIAGNOSIS — I472 Ventricular tachycardia: Secondary | ICD-10-CM | POA: Diagnosis present

## 2016-06-04 DIAGNOSIS — Z89612 Acquired absence of left leg above knee: Secondary | ICD-10-CM

## 2016-06-04 DIAGNOSIS — E872 Acidosis: Secondary | ICD-10-CM | POA: Diagnosis present

## 2016-06-04 DIAGNOSIS — F10239 Alcohol dependence with withdrawal, unspecified: Secondary | ICD-10-CM | POA: Diagnosis present

## 2016-06-04 DIAGNOSIS — K760 Fatty (change of) liver, not elsewhere classified: Secondary | ICD-10-CM | POA: Diagnosis present

## 2016-06-04 DIAGNOSIS — F17210 Nicotine dependence, cigarettes, uncomplicated: Secondary | ICD-10-CM | POA: Diagnosis present

## 2016-06-04 DIAGNOSIS — D696 Thrombocytopenia, unspecified: Secondary | ICD-10-CM | POA: Diagnosis present

## 2016-06-04 DIAGNOSIS — Y908 Blood alcohol level of 240 mg/100 ml or more: Secondary | ICD-10-CM | POA: Diagnosis present

## 2016-06-04 DIAGNOSIS — J189 Pneumonia, unspecified organism: Principal | ICD-10-CM | POA: Diagnosis present

## 2016-06-04 DIAGNOSIS — J45909 Unspecified asthma, uncomplicated: Secondary | ICD-10-CM | POA: Diagnosis present

## 2016-06-04 DIAGNOSIS — F10229 Alcohol dependence with intoxication, unspecified: Secondary | ICD-10-CM | POA: Diagnosis present

## 2016-06-04 DIAGNOSIS — E8809 Other disorders of plasma-protein metabolism, not elsewhere classified: Secondary | ICD-10-CM | POA: Diagnosis present
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
## 2016-06-05 ENCOUNTER — Emergency Department (HOSPITAL_COMMUNITY): Payer: Medicaid Other

## 2016-06-05 ENCOUNTER — Inpatient Hospital Stay (HOSPITAL_COMMUNITY)
Admission: EM | Admit: 2016-06-05 | Discharge: 2016-06-07 | DRG: 194 | Discharge status: Against Medical Advice | Payer: Medicaid Other | Attending: Student in an Organized Health Care Education/Training Program | Admitting: Student in an Organized Health Care Education/Training Program

## 2016-06-05 ENCOUNTER — Other Ambulatory Visit: Payer: Self-pay

## 2016-06-05 ENCOUNTER — Observation Stay (HOSPITAL_COMMUNITY): Payer: Medicaid Other

## 2016-06-05 ENCOUNTER — Encounter (HOSPITAL_COMMUNITY): Payer: Self-pay | Admitting: *Deleted

## 2016-06-05 DIAGNOSIS — K700 Alcoholic fatty liver: Secondary | ICD-10-CM

## 2016-06-05 DIAGNOSIS — F10229 Alcohol dependence with intoxication, unspecified: Secondary | ICD-10-CM

## 2016-06-05 DIAGNOSIS — D696 Thrombocytopenia, unspecified: Secondary | ICD-10-CM | POA: Diagnosis not present

## 2016-06-05 DIAGNOSIS — J189 Pneumonia, unspecified organism: Secondary | ICD-10-CM | POA: Diagnosis not present

## 2016-06-05 DIAGNOSIS — F10129 Alcohol abuse with intoxication, unspecified: Secondary | ICD-10-CM

## 2016-06-05 DIAGNOSIS — F10239 Alcohol dependence with withdrawal, unspecified: Secondary | ICD-10-CM

## 2016-06-05 DIAGNOSIS — Y908 Blood alcohol level of 240 mg/100 ml or more: Secondary | ICD-10-CM | POA: Diagnosis not present

## 2016-06-05 DIAGNOSIS — A419 Sepsis, unspecified organism: Secondary | ICD-10-CM | POA: Diagnosis present

## 2016-06-05 DIAGNOSIS — J45909 Unspecified asthma, uncomplicated: Secondary | ICD-10-CM

## 2016-06-05 DIAGNOSIS — K760 Fatty (change of) liver, not elsewhere classified: Secondary | ICD-10-CM

## 2016-06-05 LAB — CBC
HCT: 40.2 % (ref 39.0–52.0)
HEMOGLOBIN: 14.1 g/dL (ref 13.0–17.0)
MCH: 34.4 pg — AB (ref 26.0–34.0)
MCHC: 35.1 g/dL (ref 30.0–36.0)
MCV: 98 fL (ref 78.0–100.0)
PLATELETS: 31 10*3/uL — AB (ref 150–400)
RBC: 4.1 MIL/uL — AB (ref 4.22–5.81)
RDW: 13.8 % (ref 11.5–15.5)
WBC: 10.8 10*3/uL — ABNORMAL HIGH (ref 4.0–10.5)

## 2016-06-05 LAB — BASIC METABOLIC PANEL
ANION GAP: 12 (ref 5–15)
BUN: <5 mg/dL — ABNORMAL LOW (ref 6–20)
CALCIUM: 8.7 mg/dL — AB (ref 8.9–10.3)
CO2: 22 mmol/L (ref 22–32)
CREATININE: 0.8 mg/dL (ref 0.61–1.24)
Chloride: 102 mmol/L (ref 101–111)
GLUCOSE: 115 mg/dL — AB (ref 65–99)
Potassium: 3.3 mmol/L — ABNORMAL LOW (ref 3.5–5.1)
Sodium: 136 mmol/L (ref 135–145)

## 2016-06-05 LAB — LACTIC ACID, PLASMA
LACTIC ACID, VENOUS: 1.2 mmol/L (ref 0.5–2.0)
Lactic Acid, Venous: 2.9 mmol/L (ref 0.5–2.0)
Lactic Acid, Venous: 1.1 mmol/L (ref 0.5–2.0)

## 2016-06-05 LAB — RAPID URINE DRUG SCREEN, HOSP PERFORMED
Amphetamines: NOT DETECTED
Barbiturates: NOT DETECTED
Benzodiazepines: NOT DETECTED
Cocaine: NOT DETECTED
Opiates: NOT DETECTED
TETRAHYDROCANNABINOL: NOT DETECTED

## 2016-06-05 LAB — I-STAT TROPONIN, ED
TROPONIN I, POC: 0 ng/mL (ref 0.00–0.08)
TROPONIN I, POC: 0 ng/mL (ref 0.00–0.08)

## 2016-06-05 LAB — PROTIME-INR
INR: 1.1 (ref 0.00–1.49)
PROTHROMBIN TIME: 14.4 s (ref 11.6–15.2)

## 2016-06-05 LAB — HEPATIC FUNCTION PANEL
ALT: 40 U/L (ref 17–63)
AST: 98 U/L — AB (ref 15–41)
Albumin: 3.2 g/dL — ABNORMAL LOW (ref 3.5–5.0)
Alkaline Phosphatase: 94 U/L (ref 38–126)
BILIRUBIN DIRECT: 0.2 mg/dL (ref 0.1–0.5)
BILIRUBIN TOTAL: 0.5 mg/dL (ref 0.3–1.2)
Indirect Bilirubin: 0.3 mg/dL (ref 0.3–0.9)
Total Protein: 6.4 g/dL — ABNORMAL LOW (ref 6.5–8.1)

## 2016-06-05 LAB — TECHNOLOGIST SMEAR REVIEW

## 2016-06-05 LAB — APTT: aPTT: 37 seconds (ref 24–37)

## 2016-06-05 LAB — ETHANOL: Alcohol, Ethyl (B): 419 mg/dL (ref <5)

## 2016-06-05 LAB — MRSA PCR SCREENING: MRSA by PCR: NEGATIVE

## 2016-06-05 MED ORDER — LORAZEPAM 2 MG/ML IJ SOLN: 0.0000 mg | Freq: Two times a day (BID) | INTRAMUSCULAR | Status: DC

## 2016-06-05 MED ORDER — LORAZEPAM 2 MG/ML IJ SOLN
2.0000 mg | Freq: Once | INTRAMUSCULAR | Status: AC
  Administered 2016-06-05: 2 mg via INTRAVENOUS
  Filled 2016-06-05: qty 1

## 2016-06-05 MED ORDER — NICOTINE 21 MG/24HR TD PT24
21.0000 mg | MEDICATED_PATCH | Freq: Once | TRANSDERMAL | Status: AC
  Administered 2016-06-05: 21 mg via TRANSDERMAL
  Filled 2016-06-05: qty 1

## 2016-06-05 MED ORDER — ALBUTEROL SULFATE (2.5 MG/3ML) 0.083% IN NEBU
5.0000 mg | INHALATION_SOLUTION | Freq: Once | RESPIRATORY_TRACT | Status: AC
  Administered 2016-06-05: 5 mg via RESPIRATORY_TRACT
  Filled 2016-06-05: qty 6

## 2016-06-05 MED ORDER — HEPARIN SODIUM (PORCINE) 5000 UNIT/ML IJ SOLN: 5000.0000 [IU] | Freq: Three times a day (TID) | INTRAMUSCULAR | Status: DC

## 2016-06-05 MED ORDER — SODIUM CHLORIDE 0.9 % IV SOLN
INTRAVENOUS | Status: DC
  Administered 2016-06-05: 1000 mL via INTRAVENOUS
  Administered 2016-06-06: 13:00:00 via INTRAVENOUS
  Administered 2016-06-06: 75 mL/h via INTRAVENOUS

## 2016-06-05 MED ORDER — ALBUTEROL SULFATE (2.5 MG/3ML) 0.083% IN NEBU: 2.5000 mg | INHALATION_SOLUTION | RESPIRATORY_TRACT | Status: DC | PRN

## 2016-06-05 MED ORDER — ADULT MULTIVITAMIN W/MINERALS CH
1.0000 | ORAL_TABLET | Freq: Every day | ORAL | Status: DC
  Administered 2016-06-06 – 2016-06-07 (×2): 1 via ORAL
  Filled 2016-06-05 (×2): qty 1

## 2016-06-05 MED ORDER — DEXTROSE 5 % IV SOLN
500.0000 mg | INTRAVENOUS | Status: DC
  Administered 2016-06-05 – 2016-06-06 (×2): 500 mg via INTRAVENOUS
  Filled 2016-06-05 (×2): qty 500

## 2016-06-05 MED ORDER — LORAZEPAM 2 MG/ML IJ SOLN: 0.0000 mg | Freq: Four times a day (QID) | INTRAMUSCULAR | Status: DC

## 2016-06-05 MED ORDER — SODIUM CHLORIDE 0.9 % IV BOLUS (SEPSIS)
1000.0000 mL | Freq: Once | INTRAVENOUS | Status: AC
  Administered 2016-06-05: 1000 mL via INTRAVENOUS

## 2016-06-05 MED ORDER — VITAMIN B-1 100 MG PO TABS
100.0000 mg | ORAL_TABLET | Freq: Every day | ORAL | Status: DC
  Administered 2016-06-06 – 2016-06-07 (×2): 100 mg via ORAL
  Filled 2016-06-05 (×2): qty 1

## 2016-06-05 MED ORDER — ONDANSETRON HCL 4 MG/2ML IJ SOLN
4.0000 mg | Freq: Once | INTRAMUSCULAR | Status: AC | PRN
  Administered 2016-06-05: 4 mg via INTRAVENOUS
  Filled 2016-06-05: qty 2

## 2016-06-05 MED ORDER — SODIUM CHLORIDE 0.9 % IV SOLN
INTRAVENOUS | Status: AC
  Administered 2016-06-05: 150 mL/h via INTRAVENOUS

## 2016-06-05 MED ORDER — DEXTROSE 5 % IV SOLN
1.0000 g | Freq: Once | INTRAVENOUS | Status: AC
  Administered 2016-06-05: 1 g via INTRAVENOUS
  Filled 2016-06-05: qty 10

## 2016-06-05 MED ORDER — LORAZEPAM 2 MG/ML IJ SOLN
2.0000 mg | INTRAMUSCULAR | Status: DC | PRN
  Administered 2016-06-05: 2 mg via INTRAVENOUS
  Filled 2016-06-05: qty 1

## 2016-06-05 MED ORDER — POTASSIUM CHLORIDE CRYS ER 20 MEQ PO TBCR
20.0000 meq | EXTENDED_RELEASE_TABLET | Freq: Once | ORAL | Status: AC
  Administered 2016-06-05: 20 meq via ORAL
  Filled 2016-06-05: qty 1

## 2016-06-05 MED ORDER — CHLORDIAZEPOXIDE HCL 25 MG PO CAPS: 50.0000 mg | ORAL_CAPSULE | Freq: Every day | ORAL | Status: DC

## 2016-06-05 MED ORDER — ALBUTEROL SULFATE HFA 108 (90 BASE) MCG/ACT IN AERS: 1.0000 | INHALATION_SPRAY | RESPIRATORY_TRACT | Status: DC | PRN

## 2016-06-05 MED ORDER — THIAMINE HCL 100 MG/ML IJ SOLN
Freq: Once | INTRAVENOUS | Status: AC
  Administered 2016-06-05: 12:00:00 via INTRAVENOUS
  Filled 2016-06-05: qty 1000

## 2016-06-05 MED ORDER — THIAMINE HCL 100 MG/ML IJ SOLN
100.0000 mg | Freq: Every day | INTRAMUSCULAR | Status: DC
  Filled 2016-06-05: qty 2

## 2016-06-05 MED ORDER — IPRATROPIUM BROMIDE 0.02 % IN SOLN
0.5000 mg | Freq: Once | RESPIRATORY_TRACT | Status: AC
  Administered 2016-06-05: 0.5 mg via RESPIRATORY_TRACT
  Filled 2016-06-05: qty 2.5

## 2016-06-05 MED ORDER — DEXTROSE 5 % IV SOLN
1.0000 g | INTRAVENOUS | Status: DC
  Administered 2016-06-06: 1 g via INTRAVENOUS
  Filled 2016-06-05 (×3): qty 10

## 2016-06-05 MED ORDER — CHLORDIAZEPOXIDE HCL 5 MG PO CAPS
50.0000 mg | ORAL_CAPSULE | Freq: Two times a day (BID) | ORAL | Status: DC
  Administered 2016-06-05 – 2016-06-06 (×3): 50 mg via ORAL
  Filled 2016-06-05 (×3): qty 10

## 2016-06-05 NOTE — Progress Notes (Signed)
Pharmacy Antibiotic Note  [name] is a 35 y.o. male admitted on 06/05/2016 with pneumonia.  Pharmacy has been consulted for Ceftriaxone dosing.  Plan: Plan:  Ceftriaxone 1gIV Q24 hours  F/U LOT    Temp (24hrs), Avg:98.9 F (37.2 C), Min:98.4 F (36.9 C), Max:99.4 F (37.4 C)   Recent Labs Lab 06/05/16 0023 06/05/16 0736  WBC 10.8*  --   CREATININE 0.80  --   LATICACIDVEN  --  2.9*    CrCl cannot be calculated (Unknown ideal weight.).    No Known Allergies  Antimicrobials this admission: 6/24 CTX>> 6/24 Azithromycin>>  Microbiology results: 6/24 BCx x2>>sent  Thank you for allowing pharmacy to be a part of this patient's care.  Zanaria Morell C. Lennox Grumbles, PharmD Pharmacy Resident  Pager: [phone] 06/05/2016 10:12 AM

## 2016-06-05 NOTE — ED Notes (Signed)
Called for room, no answer

## 2016-06-05 NOTE — ED Notes (Signed)
Pt sleeping. 

## 2016-06-05 NOTE — ED Notes (Signed)
Pt given water,

## 2016-06-05 NOTE — ED Provider Notes (Signed)
CSN: [id number]     Arrival date & time 06/04/16  2346 History   First MD Initiated Contact with Patient 06/05/16 [phone]     Chief Complaint  Patient presents with  . Chest Pain  . Alcohol Intoxication     (Consider location/radiation/quality/duration/timing/severity/associated sxs/prior Treatment) HPI Comments: Patient with no significant medical history on no medications presents with complaint of chest tightness and SOB earlier tonight. He states he has a history of asthma but did not use his inhaler for symptoms. No fever or cough. No vomiting or nausea. He states he feels better now but feels if he gets up to walk he will feels SOB again. He admits to limited alcohol use tonight but denies use of any other substances.   Patient is a 35 y.o. male presenting with chest pain and intoxication. The history is provided by the patient. No language interpreter was used.  Chest Pain Pain location:  L chest and R chest Pain quality: tightness   Pain radiates to:  Does not radiate Pain radiates to the back: no   Pain severity:  Mild Onset quality:  Gradual Associated symptoms: shortness of breath   Associated symptoms: no abdominal pain, no fever and no nausea   Alcohol Intoxication Pertinent negatives include no abdominal pain, chest pain, chills, fever, myalgias, nausea or rash.    History reviewed. No pertinent past medical history. History reviewed. No pertinent past surgical history. History reviewed. No pertinent family history. Social History  Substance Use Topics  . Smoking status: Unknown If Ever Smoked  . Smokeless tobacco: None  . Alcohol Use: Yes    Review of Systems  Constitutional: Negative for fever and chills.  Respiratory: Positive for chest tightness and shortness of breath.   Cardiovascular: Negative for chest pain.  Gastrointestinal: Negative.  Negative for nausea and abdominal pain.  Musculoskeletal: Negative.  Negative for myalgias.  Skin: Negative.  Negative  for rash.  Neurological: Negative.       Allergies  Review of patient's allergies indicates no known allergies.  Home Medications   Prior to Admission medications   Medication Sig Start Date End Date Taking? Authorizing Provider  acetaminophen (TYLENOL) 500 MG tablet Take 500-1,000 mg by mouth every 6 (six) hours as needed for mild pain.    Yes Historical Provider, MD   BP 128/93 mmHg  Pulse 100  Temp(Src) 99.4 F (37.4 C) (Oral)  Resp 21  SpO2 89% Physical Exam  Constitutional: He is oriented to person, place, and time. He appears well-developed and well-nourished.  HENT:  Head: Normocephalic.  Neck: Normal range of motion. Neck supple.  Cardiovascular: Normal rate and regular rhythm.   No murmur heard. Pulmonary/Chest: Effort normal and breath sounds normal. He has no wheezes. He has no rales. He exhibits tenderness.  Bilateral chest wall tenderness to palpation.  Abdominal: Soft. Bowel sounds are normal. There is no tenderness. There is no rebound and no guarding.  Musculoskeletal: Normal range of motion.  Neurological: He is alert and oriented to person, place, and time.  Skin: Skin is warm and dry. No rash noted.  Psychiatric: He has a normal mood and affect.    ED Course  Procedures (including critical care time) Labs Review Labs Reviewed  BASIC METABOLIC PANEL - Abnormal; Notable for the following:    Potassium 3.3 (*)    Glucose, Bld 115 (*)    BUN <5 (*)    Calcium 8.7 (*)    All other components within normal limits  CBC -  Abnormal; Notable for the following:    WBC 10.8 (*)    RBC 4.10 (*)    MCH 34.4 (*)    Platelets 31 (*)    All other components within normal limits  ETHANOL - Abnormal; Notable for the following:    Alcohol, Ethyl (B) 419 (*)    All other components within normal limits  URINE RAPID DRUG SCREEN, HOSP PERFORMED  I-STAT TROPOININ, ED  I-STAT TROPOININ, ED    Imaging Review Dg Chest 2 View  06/05/2016  CLINICAL DATA:  Acute  onset of shortness of breath and generalized chest pain. Slurred speech. Initial encounter. EXAM: CHEST  2 VIEW COMPARISON:  None. FINDINGS: The lungs are hypoexpanded. Left basilar airspace opacity raises concern for pneumonia. There is no evidence of pleural effusion or pneumothorax. The heart is normal in size; the mediastinal contour is within normal limits. No acute osseous abnormalities are seen. IMPRESSION: Lungs hypoexpanded. Left basilar airspace opacity raises concern for pneumonia. Electronically Signed   By: Garald Balding M.D.   On: 06/05/2016 01:08   I have personally reviewed and evaluated these images and lab results as part of my medical decision-making.   EKG Interpretation None      MDM   Final diagnoses:  None    1. Pneumonia 2. Sepsis 3. Dyspnea  Patient presents with SOB and DOE starting tonight. He reports chest tightness. History of asthma but reports this feels different.   He is found to be tachycardic, with low grade temperature, tachypneic. Tachycardia persists despite IVF's. He is given 33mL/kg out of concern for sepsis. Has PNA on CXR. Will require admission for further treatment. Rocephin and Zithromax ordered. Discussed with Teaching Service admitting resident who accepts the patient to their service.       Charlann Lange, PA-C [id number]  Delora Fuel, MD [id number]

## 2016-06-05 NOTE — ED Notes (Signed)
Dr Benjamine Mola notified of lactic acid of 2.9

## 2016-06-05 NOTE — ED Notes (Signed)
Walked in room and pt highly anxious, shaking, asking staff to turn off monitor.  MD notified.
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
## 2016-06-05 NOTE — Progress Notes (Signed)
Pt transported to 2c11 around 3pm. Report given to Zigmund Daniel, RN receiving pt.

## 2016-06-05 NOTE — ED Notes (Signed)
Patient stood at bedside to use urinal. HR rapidly accelerated from 110 laying to 185 standing.

## 2016-06-05 NOTE — Progress Notes (Signed)
Pt running tachy. HR noted to 160's when ambulating to bathroom and running between 115-120's while sleeping. MD notified and reported pt may be moving to step down. Awaiting further orders. Will continue to monitor pt.

## 2016-06-05 NOTE — ED Notes (Signed)
1 L NS started

## 2016-06-05 NOTE — Progress Notes (Signed)
Pharmacy Antibiotic Note [name] is a 35 y.o. male admitted on 06/05/2016 with pneumonia/CAP.  Pharmacy has been consulted for ceftriaxone dosing.  Plan: 1. Begin Ceftriaxone 1 gram IV q 24 hours 2. F/u platelets 3. Pharmacy will sign off as Ceftriaxone does not require in renal dose adjustments; please re consult if needed.      Temp (24hrs), Avg:98.9 F (37.2 C), Min:98.4 F (36.9 C), Max:99.4 F (37.4 C)   Recent Labs Lab 06/05/16 0023 06/05/16 0736  WBC 10.8*  --   CREATININE 0.80  --   LATICACIDVEN  --  2.9*    CrCl cannot be calculated (Unknown ideal weight.).    No Known Allergies  Antimicrobials this admission: 6/24 Ceftriaxone >>  6/24 Azithromycin  >>   Dose adjustments this admission: n/a  Microbiology results: 6/24 BCx:    Thank you for allowing pharmacy to be a part of this patient's care.  Vincenza Hews, PharmD, BCPS 06/05/2016, 10:13 AM Pager: [phone]

## 2016-06-05 NOTE — Progress Notes (Signed)
Received report from [facility] on 5W. Danae Chen will transport patient about 1440.

## 2016-06-06 DIAGNOSIS — K700 Alcoholic fatty liver: Secondary | ICD-10-CM | POA: Diagnosis not present

## 2016-06-06 DIAGNOSIS — F17210 Nicotine dependence, cigarettes, uncomplicated: Secondary | ICD-10-CM | POA: Diagnosis present

## 2016-06-06 DIAGNOSIS — E872 Acidosis: Secondary | ICD-10-CM | POA: Diagnosis present

## 2016-06-06 DIAGNOSIS — Y908 Blood alcohol level of 240 mg/100 ml or more: Secondary | ICD-10-CM | POA: Diagnosis present

## 2016-06-06 DIAGNOSIS — Z89612 Acquired absence of left leg above knee: Secondary | ICD-10-CM | POA: Diagnosis not present

## 2016-06-06 DIAGNOSIS — K760 Fatty (change of) liver, not elsewhere classified: Secondary | ICD-10-CM | POA: Diagnosis present

## 2016-06-06 DIAGNOSIS — I472 Ventricular tachycardia: Secondary | ICD-10-CM | POA: Diagnosis present

## 2016-06-06 DIAGNOSIS — E8809 Other disorders of plasma-protein metabolism, not elsewhere classified: Secondary | ICD-10-CM | POA: Diagnosis present

## 2016-06-06 DIAGNOSIS — R0789 Other chest pain: Secondary | ICD-10-CM | POA: Diagnosis present

## 2016-06-06 DIAGNOSIS — J45909 Unspecified asthma, uncomplicated: Secondary | ICD-10-CM | POA: Diagnosis present

## 2016-06-06 DIAGNOSIS — F10229 Alcohol dependence with intoxication, unspecified: Secondary | ICD-10-CM | POA: Diagnosis present

## 2016-06-06 DIAGNOSIS — J189 Pneumonia, unspecified organism: Secondary | ICD-10-CM | POA: Diagnosis present

## 2016-06-06 DIAGNOSIS — F10239 Alcohol dependence with withdrawal, unspecified: Secondary | ICD-10-CM | POA: Diagnosis present

## 2016-06-06 DIAGNOSIS — D696 Thrombocytopenia, unspecified: Secondary | ICD-10-CM | POA: Diagnosis present

## 2016-06-06 LAB — COMPREHENSIVE METABOLIC PANEL
ALBUMIN: 3 g/dL — AB (ref 3.5–5.0)
ALT: 40 U/L (ref 17–63)
ANION GAP: 7 (ref 5–15)
AST: 106 U/L — AB (ref 15–41)
Alkaline Phosphatase: 101 U/L (ref 38–126)
BILIRUBIN TOTAL: 0.9 mg/dL (ref 0.3–1.2)
BUN: <5 mg/dL — ABNORMAL LOW (ref 6–20)
CHLORIDE: 101 mmol/L (ref 101–111)
CO2: 27 mmol/L (ref 22–32)
Calcium: 8.7 mg/dL — ABNORMAL LOW (ref 8.9–10.3)
Creatinine, Ser: 0.74 mg/dL (ref 0.61–1.24)
GFR calc Af Amer: >60 mL/min (ref >60)
Glucose, Bld: 98 mg/dL (ref 65–99)
POTASSIUM: 3.4 mmol/L — AB (ref 3.5–5.1)
Sodium: 135 mmol/L (ref 135–145)
TOTAL PROTEIN: 6.3 g/dL — AB (ref 6.5–8.1)

## 2016-06-06 LAB — CBC
HEMATOCRIT: 34.6 % — AB (ref 39.0–52.0)
HEMOGLOBIN: 11.9 g/dL — AB (ref 13.0–17.0)
MCH: 34.2 pg — ABNORMAL HIGH (ref 26.0–34.0)
MCHC: 34.4 g/dL (ref 30.0–36.0)
MCV: 99.4 fL (ref 78.0–100.0)
Platelets: 29 10*3/uL — CL (ref 150–400)
RBC: 3.48 MIL/uL — ABNORMAL LOW (ref 4.22–5.81)
RDW: 14 % (ref 11.5–15.5)
WBC: 9.1 10*3/uL (ref 4.0–10.5)

## 2016-06-06 LAB — HIV ANTIBODY (ROUTINE TESTING W REFLEX): HIV SCREEN 4TH GENERATION: NONREACTIVE

## 2016-06-06 LAB — HEPATITIS C ANTIBODY (REFLEX): HCV Ab: <0.1 s/co ratio (ref 0.0–0.9)

## 2016-06-06 LAB — HCV COMMENT:

## 2016-06-06 LAB — MAGNESIUM: MAGNESIUM: 1.7 mg/dL (ref 1.7–2.4)

## 2016-06-06 MED ORDER — NICOTINE 21 MG/24HR TD PT24
21.0000 mg | MEDICATED_PATCH | Freq: Every day | TRANSDERMAL | Status: DC
  Administered 2016-06-06: 21 mg via TRANSDERMAL
  Filled 2016-06-06: qty 1

## 2016-06-06 MED ORDER — MAGNESIUM SULFATE 2 GM/50ML IV SOLN
2.0000 g | Freq: Once | INTRAVENOUS | Status: AC
  Administered 2016-06-06: 2 g via INTRAVENOUS
  Filled 2016-06-06: qty 50

## 2016-06-06 MED ORDER — AZITHROMYCIN 500 MG PO TABS
500.0000 mg | ORAL_TABLET | Freq: Every day | ORAL | Status: DC
  Administered 2016-06-07: 500 mg via ORAL
  Filled 2016-06-06: qty 1

## 2016-06-06 NOTE — Progress Notes (Signed)
Subjective: Anxiety and agitation improved overnight. He continues to spit up sputum that is mostly clear. On review of telemetry since yesterday afternoon he had multiple runs of nonsustained Vtach each lasting less than 5 seconds. Heart rates have trended down to mostly 70s-80s from significant tachycardia above 140s yesterday. No new complaints this morning.  Objective: Vital signs in last 24 hours: Filed Vitals:   06/06/16 0007 06/06/16 0500 06/06/16 0752 06/06/16 0900  BP: 139/96 132/101 123/104 130/93  Pulse: 97 73 84 39  Temp: 99.4 F (37.4 C) 98.9 F (37.2 C) 99.2 F (37.3 C)   TempSrc: Oral Oral Oral   Resp: 21 20 22 22   Height:      Weight:      SpO2: 100% 99% 99% 90%   Weight change:   Intake/Output Summary (Last 24 hours) at 06/06/16 1233 Last data filed at 06/06/16 0900  Gross per 24 hour  Intake 2407.5 ml  Output    525 ml  Net 1882.5 ml   GENERAL- Alert, oriented, NAD CARDIAC- RRR, no murmurs, rubs or gallops RESP- Very mild crackles at left lung base, good air movement with no wheezes ABDOMEN- Soft, nontender, no guarding or rebound, normoactive bowel sounds present EXTREMITIES- RLE no pedal edema, LLE AKA with prosthetic SKIN- Warm, dry, No rash or lesion [facility]- Appropriate mood and affect  Lab Results: Basic Metabolic Panel:  Recent Labs Lab 06/05/16 0023 06/06/16 0245 06/06/16 1043  NA 136 135  --   K 3.3* 3.4*  --   CL 102 101  --   CO2 22 27  --   GLUCOSE 115* 98  --   BUN <5* <5*  --   CREATININE 0.80 0.74  --   CALCIUM 8.7* 8.7*  --   MG  --   --  1.7   Liver Function Tests:  Recent Labs Lab 06/05/16 0938 06/06/16 0245  AST 98* 106*  ALT 40 40  ALKPHOS 94 101  BILITOT 0.5 0.9  PROT 6.4* 6.3*  ALBUMIN 3.2* 3.0*   CBC:  Recent Labs Lab 06/05/16 0023 06/06/16 0245  WBC 10.8* 9.1  HGB 14.1 11.9*  HCT 40.2 34.6*  MCV 98.0 99.4  PLT 31* 29*   Coagulation:  Recent Labs Lab 06/05/16 0938  LABPROT 14.4  INR 1.10    Alcohol Level:  Recent Labs Lab 06/05/16 0001  ETH 419*    Micro Results: Recent Results (from the past 240 hour(s))  Culture, blood (routine x 2)     Status: None (Preliminary result)   Collection Time: 06/05/16  7:26 AM  Result Value Ref Range Status   Specimen Description BLOOD LEFT ANTECUBITAL  Final   Special Requests BOTTLES DRAWN AEROBIC AND ANAEROBIC 10CC  Final   Culture NO GROWTH 1 DAY  Final   Report Status PENDING  Incomplete  Culture, blood (routine x 2)     Status: None (Preliminary result)   Collection Time: 06/05/16  7:30 AM  Result Value Ref Range Status   Specimen Description BLOOD LEFT FOREARM  Final   Special Requests IN PEDIATRIC BOTTLE  3CC  Final   Culture NO GROWTH 1 DAY  Final   Report Status PENDING  Incomplete  MRSA PCR Screening     Status: None   Collection Time: 06/05/16  6:07 PM  Result Value Ref Range Status   MRSA by PCR NEGATIVE NEGATIVE Final    Comment:        The GeneXpert MRSA Assay (FDA approved  for NASAL specimens only), is one component of a comprehensive MRSA colonization surveillance program. It is not intended to diagnose MRSA infection nor to guide or monitor treatment for MRSA infections.    Studies/Results: Dg Chest 2 View  06/05/2016  CLINICAL DATA:  Acute onset of shortness of breath and generalized chest pain. Slurred speech. Initial encounter. EXAM: CHEST  2 VIEW COMPARISON:  None. FINDINGS: The lungs are hypoexpanded. Left basilar airspace opacity raises concern for pneumonia. There is no evidence of pleural effusion or pneumothorax. The heart is normal in size; the mediastinal contour is within normal limits. No acute osseous abnormalities are seen. IMPRESSION: Lungs hypoexpanded. Left basilar airspace opacity raises concern for pneumonia. Electronically Signed   By: Garald Balding M.D.   On: 06/05/2016 01:08   US Abdomen Complete  06/05/2016  CLINICAL DATA:  Thrombocytopenia, fatty liver, ETOH EXAM: ABDOMEN  ULTRASOUND COMPLETE COMPARISON:  None. FINDINGS: Gallbladder: Gallstones are noted within gallbladder the largest measures 5 mm. No thickening of gallbladder wall. No sonographic Murphy's sign. Common bile duct: Diameter: 3.8 mm in diameter within normal limits. Liver: No focal hepatic mass. There is diffuse increased echogenicity of the liver suspicious for fatty infiltration. IVC: No abnormality visualized. Pancreas: Suboptimal visualization due to bowel gas. Spleen: Size and appearance within normal limits. Measures 4.5 cm in length. Right Kidney: Length: 12.2 cm. Echogenicity within normal limits. No mass or hydronephrosis visualized. Left Kidney: Length: 13.7 cm. Echogenicity within normal limits. No mass or hydronephrosis visualized. Abdominal aorta: No aneurysm visualized. Measures up to 2.2 cm in diameter. Other findings: None. IMPRESSION: 1. Small gallstones are noted within gallbladder the largest measures 5 mm. No thickening of gallbladder wall. No sonographic Murphy's sign. 2. There is fatty infiltration of the liver. 3. No hydronephrosis.  No renal calculi. 4. No aortic aneurysm. Electronically Signed   By: Lahoma Crocker M.D.   On: 06/05/2016 17:43   Medications: I have reviewed the patient's current medications. Scheduled Meds: . [START ON 06/07/2016] azithromycin  500 mg Oral Q breakfast  . cefTRIAXone (ROCEPHIN)  IV  1 g Intravenous Q24H  . chlordiazePOXIDE  50 mg Oral BID  . magnesium sulfate 1 - 4 g bolus IVPB  2 g Intravenous Once  . multivitamin with minerals  1 tablet Oral Daily  . thiamine  100 mg Oral Daily   Or  . thiamine  100 mg Intravenous Daily   Continuous Infusions: . sodium chloride 1,000 mL (06/05/16 1526)   PRN Meds:.albuterol, LORazepam Assessment/Plan: Community acquired pneumonia: No fever, leukocytosis, hypoxia, with very mild physical exam finding of crackles. He does continue to spit up clear sputum. He is a current every day smoker with extensive drinking but no  vomiting or recent LOC reported. We will continue to treat for regular 5 day course with transition to oral abtx PTD. -Azithromycin and ceftriaxone IV 6/24>>>  EtOH abuse with intoxication: Patient high risk due to previous history of seizure during hospitalization with alcohol withdrawal. Currently comfortable needing fairly low doses of PRN ativan, last 2mg  IV at 2035 yesterday. Continues to be high risk through today then will plan to taper librium starting tomorrow. Magnesium low normal, will supplement to goal of 2-3. -Cardiac monitoring -Librium 50mg  BID -Ativan PRN per CIWA protocol  Thrombocytopenia: Repeat platelet count of 29. No platelet clumps reported on peripheral blood smear.Abdomional US redemonstrated hepatosteatosis, no splenomegaly, no free fluid. PT/INR normal so liver function is not grossly impaired despite his early signs of disease process. Initial HepC  negative. HIV pending. This could also be from direct effect of alcohol on bone marrow platelet production. -Hold pharmacologic VTE ppx -Needs follow up outpatient and hematology evaluation if significantly depressed  FULL CODE Diet: Regular VTe ppx: SCDs  Dispo: With continued clinical improvement patient would be appropriate for discharge in approximately 1-2 days. The patient does not have a current PCP (No primary care provider on file.) and does need an [facility] hospital follow-up appointment after discharge. The patient does have transportation limitations that hinder transportation to clinic appointments.   LOS: 1 day   Collier Salina, MD 06/06/2016, 12:33 PM

## 2016-06-06 NOTE — Progress Notes (Signed)
CRITICAL VALUE ALERT  Critical value received:  Platelets: 29  Date of notification:  06/06/2016  Time of notification:  [id number]  Critical value read back:Yes.    Nurse who received alert:  Brien Mates RN  MD notified (1st page):  IMTS coverage  Time of first page:  0400  MD notified (2nd page):  Time of second page:  Responding MD:  IMTS coverage  Time MD responded:  [id number]

## 2016-06-07 LAB — CBC
HEMATOCRIT: 38.9 % — AB (ref 39.0–52.0)
Hemoglobin: 13.2 g/dL (ref 13.0–17.0)
MCH: 34.2 pg — ABNORMAL HIGH (ref 26.0–34.0)
MCHC: 33.9 g/dL (ref 30.0–36.0)
MCV: 100.8 fL — ABNORMAL HIGH (ref 78.0–100.0)
PLATELETS: 42 10*3/uL — AB (ref 150–400)
RBC: 3.86 MIL/uL — AB (ref 4.22–5.81)
RDW: 13.7 % (ref 11.5–15.5)
WBC: 9.5 10*3/uL (ref 4.0–10.5)

## 2016-06-07 LAB — BASIC METABOLIC PANEL
ANION GAP: 7 (ref 5–15)
CHLORIDE: 103 mmol/L (ref 101–111)
CO2: 25 mmol/L (ref 22–32)
Calcium: 9.3 mg/dL (ref 8.9–10.3)
Creatinine, Ser: 0.75 mg/dL (ref 0.61–1.24)
Glucose, Bld: 104 mg/dL — ABNORMAL HIGH (ref 65–99)
POTASSIUM: 3.4 mmol/L — AB (ref 3.5–5.1)
SODIUM: 135 mmol/L (ref 135–145)

## 2016-06-07 LAB — SAVE SMEAR

## 2016-06-07 MED ORDER — CHLORDIAZEPOXIDE HCL 25 MG PO CAPS: 50.0000 mg | ORAL_CAPSULE | Freq: Every day | ORAL | Status: DC

## 2016-06-07 MED ORDER — AZITHROMYCIN 500 MG PO TABS: 500.0000 mg | ORAL_TABLET | Freq: Every day | ORAL | Status: DC

## 2016-06-07 MED ORDER — CEFUROXIME AXETIL 500 MG PO TABS: 500.0000 mg | ORAL_TABLET | Freq: Two times a day (BID) | ORAL | Status: DC
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
## 2016-06-07 NOTE — Progress Notes (Signed)
Subjective: [name] is feeling much improved without any symptomatic complaints today. He did not require any as needed treatment for withdrawal symptoms. He continues to have asymptomatic tachycardia with ambulation and sitting upright. He was very eager to leave the hospital and attend to his affairs at home this morning. We recommended additional evaluation with and echocardiogram today but he was not interested in staying until later today. He expressed a willingness to follow up with his primary doctor and was amenable to being contacted by [facility] after discharge as well.  Objective: Vital signs in last 24 hours: Filed Vitals:   06/07/16 0007 06/07/16 0338 06/07/16 0415 06/07/16 0800  BP: 154/99 141/104 135/79 147/96  Pulse:  136 84 64  Temp:  99.1 F (37.3 C)  99.1 F (37.3 C)  TempSrc:  Oral  Oral  Resp: 22 23 22 20   Height:      Weight:      SpO2:  98% 100% 100%   Weight change:   Intake/Output Summary (Last 24 hours) at 06/07/16 1124 Last data filed at 06/07/16 0800  Gross per 24 hour  Intake   2422 ml  Output   2275 ml  Net    147 ml   GENERAL- Alert, oriented, NAD CARDIAC- RRR, no murmurs, rubs or gallops RESP- CTAB, no wheezes EXTREMITIES- RLE no pedal edema, LLE AKA with prosthetic SKIN- Warm, dry, No rash or lesion [facility]- Appropriate mood and affect  Lab Results: Basic Metabolic Panel:  Recent Labs Lab 06/06/16 0245 06/06/16 1043 06/07/16 0332  NA 135  --  135  K 3.4*  --  3.4*  CL 101  --  103  CO2 27  --  25  GLUCOSE 98  --  104*  BUN <5*  --  <5*  CREATININE 0.74  --  0.75  CALCIUM 8.7*  --  9.3  MG  --  1.7  --    Liver Function Tests:  Recent Labs Lab 06/05/16 0938 06/06/16 0245  AST 98* 106*  ALT 40 40  ALKPHOS 94 101  BILITOT 0.5 0.9  PROT 6.4* 6.3*  ALBUMIN 3.2* 3.0*   CBC:  Recent Labs Lab 06/06/16 0245 06/07/16 0332  WBC 9.1 9.5  HGB 11.9* 13.2  HCT 34.6* 38.9*  MCV 99.4 100.8*  PLT 29* 42*    Coagulation:  Recent Labs Lab 06/05/16 0938  LABPROT 14.4  INR 1.10   Alcohol Level:  Recent Labs Lab 06/05/16 0001  ETH 419*    Micro Results: Recent Results (from the past 240 hour(s))  Culture, blood (routine x 2)     Status: None (Preliminary result)   Collection Time: 06/05/16  7:26 AM  Result Value Ref Range Status   Specimen Description BLOOD LEFT ANTECUBITAL  Final   Special Requests BOTTLES DRAWN AEROBIC AND ANAEROBIC 10CC  Final   Culture NO GROWTH 1 DAY  Final   Report Status PENDING  Incomplete  Culture, blood (routine x 2)     Status: None (Preliminary result)   Collection Time: 06/05/16  7:30 AM  Result Value Ref Range Status   Specimen Description BLOOD LEFT FOREARM  Final   Special Requests IN PEDIATRIC BOTTLE  3CC  Final   Culture NO GROWTH 1 DAY  Final   Report Status PENDING  Incomplete  MRSA PCR Screening     Status: None   Collection Time: 06/05/16  6:07 PM  Result Value Ref Range Status   MRSA by PCR NEGATIVE NEGATIVE Final  Comment:        The GeneXpert MRSA Assay (FDA approved for NASAL specimens only), is one component of a comprehensive MRSA colonization surveillance program. It is not intended to diagnose MRSA infection nor to guide or monitor treatment for MRSA infections.    Studies/Results: US Abdomen Complete  06/05/2016  CLINICAL DATA:  Thrombocytopenia, fatty liver, ETOH EXAM: ABDOMEN ULTRASOUND COMPLETE COMPARISON:  None. FINDINGS: Gallbladder: Gallstones are noted within gallbladder the largest measures 5 mm. No thickening of gallbladder wall. No sonographic Murphy's sign. Common bile duct: Diameter: 3.8 mm in diameter within normal limits. Liver: No focal hepatic mass. There is diffuse increased echogenicity of the liver suspicious for fatty infiltration. IVC: No abnormality visualized. Pancreas: Suboptimal visualization due to bowel gas. Spleen: Size and appearance within normal limits. Measures 4.5 cm in length. Right  Kidney: Length: 12.2 cm. Echogenicity within normal limits. No mass or hydronephrosis visualized. Left Kidney: Length: 13.7 cm. Echogenicity within normal limits. No mass or hydronephrosis visualized. Abdominal aorta: No aneurysm visualized. Measures up to 2.2 cm in diameter. Other findings: None. IMPRESSION: 1. Small gallstones are noted within gallbladder the largest measures 5 mm. No thickening of gallbladder wall. No sonographic Murphy's sign. 2. There is fatty infiltration of the liver. 3. No hydronephrosis.  No renal calculi. 4. No aortic aneurysm. Electronically Signed   By: Lahoma Crocker M.D.   On: 06/05/2016 17:43   Medications: I have reviewed the patient's current medications. Scheduled Meds: . azithromycin  500 mg Oral Q breakfast  . cefUROXime  500 mg Oral BID WC  . [START ON 06/08/2016] chlordiazePOXIDE  50 mg Oral Daily  . multivitamin with minerals  1 tablet Oral Daily  . nicotine  21 mg Transdermal Daily  . thiamine  100 mg Oral Daily   Or  . thiamine  100 mg Intravenous Daily   Continuous Infusions:   PRN Meds:.albuterol, LORazepam Assessment/Plan: Community acquired pneumonia: Antibiotics changed to oral azithromycin and ceftin. Patient reported he would not have funds for filling these until the end of the month so pharmacy to assist and sent to [place] -Azithromycin and ceftin to continue x2days after discharge for a total of 5 days CAP treatment  EtOH abuse with intoxication: He is doing very well on a maintenance dose of librium without any PRN ativan required for withdrawal symptoms. We will prescribe half dose as a tape x3 days outpatient.  Tachycardia: Unclear why he has persistent asymptomatic tachycardia with ambulation and positional changes. During his first 24 hours of admission several runs of nonsustained ventricular tachycardia were noted on telemetry. No history of previous echocardiogram. He is at risk for an ethanol induced myopathy. Does also  have a history of previous but reportedly very intermittent cocaine use. He is extremely young for any ischemic cardiomyopathy without any known family history. -Needs to follow up for outpatient echocardiography  Thrombocytopenia: Platelet count 42. HIV negative. This could also be from direct effect of alcohol on bone marrow platelet production. Peripheral blood smear will be reviewed for any other evidence of hematologic abnomrlaity. -Hold pharmacologic VTE ppx -Save peripheral blood smear for review -Will need outpatient follow up  FULL CODE Diet: Regular VTe ppx: SCDs  Dispo: Patient to leave today. Amenable to follow up with PCP or if not [facility]. The patient does have transportation limitations that hinder transportation to clinic appointments.   LOS: 2 days   Nathaniel Salina, MD 06/07/2016, 11:24 AM

## 2016-06-07 NOTE — Progress Notes (Signed)
Patient signed out AMA

## 2016-06-07 NOTE — Progress Notes (Signed)
Amb  out in the hallway and noted sinus tach on the monitor with the rate of 120-150. No distress noted. Ambulated back to room and heart rate down to 90's.

## 2016-06-10 LAB — CULTURE, BLOOD (ROUTINE X 2)
CULTURE: NO GROWTH
CULTURE: NO GROWTH

## 2016-06-11 ENCOUNTER — Emergency Department (HOSPITAL_COMMUNITY): Payer: Medicaid Other

## 2016-06-11 ENCOUNTER — Emergency Department (HOSPITAL_COMMUNITY)
Admission: EM | Admit: 2016-06-11 | Discharge: 2016-06-11 | Discharge status: Against Medical Advice | Payer: Medicaid Other | Attending: Emergency Medicine | Admitting: Emergency Medicine

## 2016-06-11 ENCOUNTER — Encounter (HOSPITAL_COMMUNITY): Payer: Self-pay | Admitting: Emergency Medicine

## 2016-06-11 DIAGNOSIS — F10120 Alcohol abuse with intoxication, uncomplicated: Secondary | ICD-10-CM | POA: Diagnosis not present

## 2016-06-11 DIAGNOSIS — F10920 Alcohol use, unspecified with intoxication, uncomplicated: Secondary | ICD-10-CM

## 2016-06-11 DIAGNOSIS — F17210 Nicotine dependence, cigarettes, uncomplicated: Secondary | ICD-10-CM | POA: Insufficient documentation

## 2016-06-11 DIAGNOSIS — Z8673 Personal history of transient ischemic attack (TIA), and cerebral infarction without residual deficits: Secondary | ICD-10-CM | POA: Insufficient documentation

## 2016-06-11 LAB — I-STAT TROPONIN, ED: Troponin i, poc: 0 ng/mL (ref 0.00–0.08)

## 2016-06-11 LAB — CBC WITH DIFFERENTIAL/PLATELET
Basophils Absolute: 0.1 10*3/uL (ref 0.0–0.1)
Basophils Relative: 1 %
EOS ABS: 0.2 10*3/uL (ref 0.0–0.7)
Eosinophils Relative: 2 %
HEMATOCRIT: 36.1 % — AB (ref 39.0–52.0)
HEMOGLOBIN: 13.1 g/dL (ref 13.0–17.0)
LYMPHS ABS: 5.7 10*3/uL — AB (ref 0.7–4.0)
LYMPHS PCT: 64 %
MCH: 36.3 pg — AB (ref 26.0–34.0)
MCHC: 36.3 g/dL — AB (ref 30.0–36.0)
MCV: 100 fL (ref 78.0–100.0)
MONOS PCT: 8 %
Monocytes Absolute: 0.7 10*3/uL (ref 0.1–1.0)
NEUTROS ABS: 2.2 10*3/uL (ref 1.7–7.7)
NEUTROS PCT: 25 %
Platelets: 258 10*3/uL (ref 150–400)
RBC: 3.61 MIL/uL — ABNORMAL LOW (ref 4.22–5.81)
RDW: 14.4 % (ref 11.5–15.5)
WBC: 8.8 10*3/uL (ref 4.0–10.5)

## 2016-06-11 LAB — I-STAT CHEM 8, ED
BUN: 4 mg/dL — AB (ref 6–20)
CALCIUM ION: 1.11 mmol/L — AB (ref 1.13–1.30)
CREATININE: 1.3 mg/dL — AB (ref 0.61–1.24)
Chloride: 112 mmol/L — ABNORMAL HIGH (ref 101–111)
GLUCOSE: 107 mg/dL — AB (ref 65–99)
HCT: 40 % (ref 39.0–52.0)
Hemoglobin: 13.6 g/dL (ref 13.0–17.0)
Potassium: 3.5 mmol/L (ref 3.5–5.1)
Sodium: 145 mmol/L (ref 135–145)
TCO2: 22 mmol/L (ref 0–100)

## 2016-06-11 LAB — SALICYLATE LEVEL: Salicylate Lvl: <4 mg/dL (ref 2.8–30.0)

## 2016-06-11 LAB — ACETAMINOPHEN LEVEL: Acetaminophen (Tylenol), Serum: <10 ug/mL — ABNORMAL LOW (ref 10–30)

## 2016-06-11 MED ORDER — ALBUTEROL SULFATE (2.5 MG/3ML) 0.083% IN NEBU
5.0000 mg | INHALATION_SOLUTION | Freq: Once | RESPIRATORY_TRACT | Status: AC
  Administered 2016-06-11: 5 mg via RESPIRATORY_TRACT
  Filled 2016-06-11: qty 6

## 2016-06-11 NOTE — ED Notes (Signed)
Pt insisting that he was seen 2 days ago at [facility] and is here for the same reason; RN advised that the last time he was seen according to the chart was 3/14; pt states "Transfer [facility] to [facility]." RN advised that we are unable to transfer him to [facility]

## 2016-06-11 NOTE — ED Notes (Signed)
Bed: [facility] Expected date:  Expected time:  Means of arrival:  Comments: 

## 2016-06-11 NOTE — ED Notes (Signed)
Pt comes to ed, with intoxication. Complain of SOB. Pt is not wanting to communicate. Pt is having conversations with himself. Getting pts information from EMS questionable. Alert but not with it. [place], [place].  Home address.

## 2016-06-11 NOTE — ED Notes (Signed)
Patient left stating he wants to go to [place]. Patient walked out and would not sign.

## 2016-06-11 NOTE — ED Notes (Signed)
Dr Randal Buba in to see pt; pt refused to talk and answer questions as to why he is here; pt VSS; pt moved to [place]

## 2016-06-11 NOTE — ED Provider Notes (Signed)
CSN: [id number]     Arrival date & time 06/11/16  0038 History  By signing my name below, I, Reola Mosher, attest that this documentation has been prepared under the direction and in the presence of Dartagnan Beavers, MD.  Electronically Signed: Reola Mosher, ED Scribe. 06/11/2016. 12:54 AM.   Chief Complaint  Patient presents with  . Alcohol Intoxication   Patient is a 35 y.o. male presenting with intoxication. The history is provided by the patient. The history is limited by the condition of the patient. No language interpreter was used.  Alcohol Intoxication This is a recurrent problem. The problem occurs constantly. The problem has not changed since onset.Associated symptoms include shortness of breath. Nothing aggravates the symptoms. Nothing relieves the symptoms. He has tried nothing for the symptoms. The treatment provided no relief.   LEVEL 5 CAVEAT: HPI and ROS limited due to alcohol intoxication and uncooperativeness   HPI Comments: [name] is a 35 y.o. male brought in by ambulance who presents to the Emergency Department complaining of constant SOB onset PTA. Pt is currently intoxicated after drinking an unspecified amount of alcohol PTA.  Past Medical History  Diagnosis Date  . Seizures ([place])   . Stroke ([place])   . Alcohol abuse    Past Surgical History  Procedure Laterality Date  . Below the knee amputation    . Below knee leg amputation      LEFT   No family history on file. Social History  Substance Use Topics  . Smoking status: Current Every Day Smoker -- 0.33 packs/day for 20 years    Types: Cigarettes  . Smokeless tobacco: Never Used  . Alcohol Use: 1.2 oz/week    2 Cans of beer per week     Comment: not every day    Review of Systems  Unable to perform ROS: Other  Respiratory: Positive for shortness of breath.    Allergies  Review of patient's allergies indicates no known allergies.  Home Medications   Prior to Admission  medications   Medication Sig Start Date End Date Taking? Authorizing Provider  albuterol (PROVENTIL HFA;VENTOLIN HFA) 108 (90 BASE) MCG/ACT inhaler Inhale 1 puff into the lungs every 6 (six) hours as needed for wheezing or shortness of breath.    Historical Provider, MD  potassium chloride (K-DUR) 10 MEQ tablet Take 4 tablets (40 mEq total) by mouth daily. 05/03/12 05/05/13  Na Li, MD   BP 107/74 mmHg  Pulse 95  Temp(Src) 98.3 F (36.8 C)  Resp 18  SpO2 94%   Physical Exam  Constitutional: He appears well-developed and well-nourished. No distress.  HENT:  Head: Normocephalic and atraumatic.  Mouth/Throat: Oropharynx is clear and moist. No oropharyngeal exudate.  Trachea is midline. Mallampati class 1 airway.   Eyes: Conjunctivae and EOM are normal. Pupils are equal, round, and reactive to light.  Neck: Trachea normal and normal range of motion. Neck supple. No JVD present. Carotid bruit is not present. No tracheal deviation present.  Cardiovascular: Normal rate, regular rhythm and normal heart sounds.  Exam reveals no gallop and no friction rub.   No murmur heard. Pulmonary/Chest: Effort normal and breath sounds normal. No stridor. He has no wheezes. He has no rales.  Abdominal: Soft. Bowel sounds are normal. There is no tenderness. There is no rebound and no guarding.  Musculoskeletal: Normal range of motion.  He has a prosthetic left leg.  Lymphadenopathy:    He has no cervical adenopathy.  Neurological: He is  alert. He has normal reflexes.  He has intact phonation.  Skin: Skin is warm and dry. He is not diaphoretic.  Psychiatric: He has a normal mood and affect. His behavior is normal.  Nursing note and vitals reviewed.  ED Course  Procedures (including critical care time) DIAGNOSTIC STUDIES: Oxygen Saturation is 94% on RA, adequate by my interpretation.   Labs Review Labs Reviewed  CBC WITH DIFFERENTIAL/PLATELET  URINE RAPID DRUG SCREEN, HOSP PERFORMED  ACETAMINOPHEN  LEVEL  SALICYLATE LEVEL  I-STAT CHEM 8, ED  I-STAT TROPOININ, ED    Imaging Review No results found.  I have personally reviewed and evaluated these images and lab results as part of my medical decision-making.   EKG Interpretation   Date/Time:  Friday June 11 2016 01:48:41 EDT Ventricular Rate:  101 PR Interval:  156 QRS Duration: 96 QT Interval:  366 QTC Calculation: 474 R Axis:   13 Text Interpretation:  Sinus tachycardia Minimal voltage criteria for LVH,  may be normal variant Confirmed by [facility]  MD, Anvitha Hutmacher ([id number]) on  06/11/2016 1:52:03 AM     MDM   Final diagnoses:  None   Filed Vitals:   06/11/16 0040  BP: 107/74  Pulse: 95  Temp: 98.3 F (36.8 C)  Resp: 18   Results for orders placed or performed during the hospital encounter of 06/11/16  CBC with Differential/Platelet  Result Value Ref Range   WBC 8.8 4.0 - 10.5 K/uL   RBC 3.61 (L) 4.22 - 5.81 MIL/uL   Hemoglobin 13.1 13.0 - 17.0 g/dL   HCT 36.1 (L) 39.0 - 52.0 %   MCV 100.0 78.0 - 100.0 fL   MCH 36.3 (H) 26.0 - 34.0 pg   MCHC 36.3 (H) 30.0 - 36.0 g/dL   RDW 14.4 11.5 - 15.5 %   Platelets 258 150 - 400 K/uL   Neutrophils Relative % 25 %   Neutro Abs 2.2 1.7 - 7.7 K/uL   Lymphocytes Relative 64 %   Lymphs Abs 5.7 (H) 0.7 - 4.0 K/uL   Monocytes Relative 8 %   Monocytes Absolute 0.7 0.1 - 1.0 K/uL   Eosinophils Relative 2 %   Eosinophils Absolute 0.2 0.0 - 0.7 K/uL   Basophils Relative 1 %   Basophils Absolute 0.1 0.0 - 0.1 K/uL  Acetaminophen level  Result Value Ref Range   Acetaminophen (Tylenol), Serum <10 (L) 10 - 30 ug/mL  Salicylate level  Result Value Ref Range   Salicylate Lvl 123456 2.8 - 30.0 mg/dL  I-stat chem 8, ed  Result Value Ref Range   Sodium 145 135 - 145 mmol/L   Potassium 3.5 3.5 - 5.1 mmol/L   Chloride 112 (H) 101 - 111 mmol/L   BUN 4 (L) 6 - 20 mg/dL   Creatinine, Ser 1.30 (H) 0.61 - 1.24 mg/dL   Glucose, Bld 107 (H) 65 - 99 mg/dL   Calcium, Ion 1.11 (L) 1.13 -  1.30 mmol/L   TCO2 22 0 - 100 mmol/L   Hemoglobin 13.6 13.0 - 17.0 g/dL   HCT 40.0 39.0 - 52.0 %  I-stat troponin, ED  Result Value Ref Range   Troponin i, poc 0.00 0.00 - 0.08 ng/mL   Comment 3           Dg Chest 2 View  06/11/2016  CLINICAL DATA:  Initial evaluation for acute shortness of breath. EXAM: CHEST  2 VIEW COMPARISON:  Prior radiograph from 06/05/2016. FINDINGS: Transverse heart size at the upper limits of normal,  stable. Mediastinal silhouette within normal limits. Lungs are hypoinflated. Previously seen left basilar opacity is largely cleared. No new focal airspace disease. No pulmonary edema or pleural effusion. No pneumothorax. No acute osseous abnormality. IMPRESSION: No active cardiopulmonary disease. Recently seen left basilar opacity has essentially cleared. Electronically Signed   By: Jeannine Boga M.D.   On: 06/11/2016 02:36    Apparently eloped during the work up   I personally performed the services described in this documentation, which was scribed in my presence. The recorded information has been reviewed and is accurate.      Veatrice Kells, MD 06/11/16 [phone]

## 2016-06-11 NOTE — ED Notes (Signed)
Bed: [id number] Expected date:  Expected time:  Means of arrival:  Comments: EMS 35yo M ? ETOH vs drug use; dec LOC

## 2016-06-21 ENCOUNTER — Encounter (HOSPITAL_COMMUNITY): Payer: Self-pay | Admitting: *Deleted

## 2016-06-21 DIAGNOSIS — F17210 Nicotine dependence, cigarettes, uncomplicated: Secondary | ICD-10-CM | POA: Diagnosis not present

## 2016-06-21 DIAGNOSIS — J45909 Unspecified asthma, uncomplicated: Secondary | ICD-10-CM | POA: Diagnosis not present

## 2016-06-21 DIAGNOSIS — Z8673 Personal history of transient ischemic attack (TIA), and cerebral infarction without residual deficits: Secondary | ICD-10-CM | POA: Diagnosis not present

## 2016-06-21 MED ORDER — IPRATROPIUM-ALBUTEROL 0.5-2.5 (3) MG/3ML IN SOLN
RESPIRATORY_TRACT | Status: AC
  Filled 2016-06-21: qty 3

## 2016-06-21 MED ORDER — IPRATROPIUM-ALBUTEROL 0.5-2.5 (3) MG/3ML IN SOLN
3.0000 mL | Freq: Once | RESPIRATORY_TRACT | Status: AC
  Administered 2016-06-21: 3 mL via RESPIRATORY_TRACT

## 2016-06-21 NOTE — ED Notes (Signed)
Pt in stating his asthma is acting up, pt admits to ETOH, speaking in full sentences, left AMA from [facility].

## 2016-06-22 ENCOUNTER — Emergency Department (HOSPITAL_COMMUNITY)
Admission: EM | Admit: 2016-06-22 | Discharge: 2016-06-22 | Disposition: A | Discharge status: Home / Self Care | Payer: Medicaid Other | Attending: Emergency Medicine | Admitting: Emergency Medicine

## 2016-06-22 DIAGNOSIS — J45909 Unspecified asthma, uncomplicated: Secondary | ICD-10-CM

## 2016-06-22 MED ORDER — ALBUTEROL SULFATE HFA 108 (90 BASE) MCG/ACT IN AERS
2.0000 | INHALATION_SPRAY | Freq: Once | RESPIRATORY_TRACT | Status: AC
  Administered 2016-06-22: 2 via RESPIRATORY_TRACT
  Filled 2016-06-22: qty 6.7

## 2016-06-22 NOTE — Discharge Instructions (Signed)
Asthma Attack Prevention Mr. [name], see you primary doctor within 3 days for close follow up. Use albuterol inhaler as needed for asthma. If symptoms worsen, come back to the ED immediately. Thank you. While you may not be able to control the fact that you have asthma, you can take actions to prevent asthma attacks. The best way to prevent asthma attacks is to maintain good control of your asthma. You can achieve this by:  Taking your medicines as directed.  Avoiding things that can irritate your airways or make your asthma symptoms worse (asthma triggers).  Keeping track of how well your asthma is controlled and of any changes in your symptoms.  Responding quickly to worsening asthma symptoms (asthma attack).  Seeking emergency care when it is needed. WHAT ARE SOME WAYS TO PREVENT AN ASTHMA ATTACK? Have a Plan Work with your health care provider to create a written plan for managing and treating your asthma attacks (asthma action plan). This plan includes:  A list of your asthma triggers and how you can avoid them.  Information on when medicines should be taken and when their dosages should be changed.  The use of a device that measures how well your lungs are working (peak flow meter). Monitor Your Asthma Use your peak flow meter and record your results in a journal every day. A drop in your peak flow numbers on one or more days may indicate the start of an asthma attack. This can happen even before you start to feel symptoms. You can prevent an asthma attack from getting worse by following the steps in your asthma action plan. Avoid Asthma Triggers Work with your asthma health care provider to find out what your asthma triggers are. This can be done by:  Allergy testing.  Keeping a journal that notes when asthma attacks occur and the factors that may have contributed to them.  Determining if there are other medical conditions that are making your asthma worse. Once you have  determined your asthma triggers, take steps to avoid them. This may include avoiding excessive or prolonged exposure to:  Dust. Have someone dust and vacuum your home for you once or twice a week. Using a high-efficiency particulate arrestance (HEPA) vacuum is best.  Smoke. This includes campfire smoke, forest fire smoke, and secondhand smoke from tobacco products.  Pet dander. Avoid contact with animals that you know you are allergic to.  Allergens from trees, grasses or pollens. Avoid spending a lot of time outdoors when pollen counts are high, and on very windy days.  Very cold, dry, or humid air.  Mold.  Foods that contain high amounts of sulfites.  Strong odors.  Outdoor air pollutants, such as [employer].  Indoor air pollutants, such as aerosol sprays and fumes from household cleaners.  Household pests, including dust mites and cockroaches, and pest droppings.  Certain medicines, including NSAIDs. Always talk to your health care provider before stopping or starting any new medicines. Medicines Take over-the-counter and prescription medicines only as told by your health care provider. Many asthma attacks can be prevented by carefully following your medicine schedule. Taking your medicines correctly is especially important when you cannot avoid certain asthma triggers. Act Quickly If an asthma attack does happen, acting quickly can decrease how severe it is and how long it lasts. Take these steps:   Pay attention to your symptoms. If you are coughing, wheezing, or having difficulty breathing, do not wait to see if your symptoms go away on their  own. Follow your asthma action plan.  If you have followed your asthma action plan and your symptoms are not improving, call your health care provider or seek immediate medical care at the nearest hospital. It is important to note how often you need to use your fast-acting rescue inhaler. If you are using your rescue inhaler more  often, it may mean that your asthma is not under control. Adjusting your asthma treatment plan may help you to prevent future asthma attacks and help you to gain better control of your condition. HOW CAN I PREVENT AN ASTHMA ATTACK WHEN I EXERCISE? Follow advice from your health care provider about whether you should use your fast-acting inhaler before exercising. Many people with asthma experience exercise-induced bronchoconstriction (EIB). This condition often worsens during vigorous exercise in cold, humid, or dry environments. Usually, people with EIB can stay very active by pre-treating with a fast-acting inhaler before exercising.   This information is not intended to replace advice given to you by your health care provider. Make sure you discuss any questions you have with your health care provider.   Document Released: 11/17/2009 Document Revised: 08/20/2015 Document Reviewed: 05/01/2015 Elsevier Interactive Patient Education [employer].

## 2016-06-22 NOTE — ED Notes (Signed)
RN attempted to check peak flow with patient, who was uncooperative. Patient kept stating that he cannot get any air, makes noises with his mouth and talks continuously while RN is assessing his lung sounds. Patient then refuses to talk with RN about why he is here and repeats over and over "get the other two doctors and look in my chart... They know what medicine I need... Look in my chart." MD notified.

## 2016-06-22 NOTE — ED Notes (Signed)
Patient unhooked self from monitor and stepped out as patient was going over instructions for his albuterol inhaler. RN got patient to come back into room and explained inhaler instructions and then he walked out of the room. MD handed instructions to patient as he was leaving and patient told this RN to go over them quickly so he could leave. RN escorted patient out and went over follow up information. Patient verbalized understanding. Refused to sign out.

## 2016-06-22 NOTE — ED Provider Notes (Signed)
CSN: [id number]     Arrival date & time 06/21/16  2140 History  By signing my name below, I, Altamease Oiler, attest that this documentation has been prepared under the direction and in the presence of Everlene Balls, MD. Electronically Signed: Altamease Oiler, ED Scribe. 06/22/2016. 2:56 AM   Chief Complaint  Patient presents with  . Asthma   The history is provided by the patient. No language interpreter was used.   [name] is a 35 y.o. male with PMHx of asthma and alcohol abuse who presents to the Emergency Department complaining of SOB. Pt states that he does not have albuterol at home but has used it in the past. He does not have a PCP.   Past Medical History  Diagnosis Date  . Seizures ([place])   . Stroke ([place])   . Alcohol abuse    Past Surgical History  Procedure Laterality Date  . Above knee amputation of left leg    . Below the knee amputation    . Below knee leg amputation      LEFT   Family History  Problem Relation Age of Onset  . Heart attack Mother     Negative Hx   Social History  Substance Use Topics  . Smoking status: Current Every Day Smoker -- 0.33 packs/day for 20 years    Types: Cigarettes  . Smokeless tobacco: Never Used  . Alcohol Use: 1.2 oz/week    2 Cans of beer per week     Comment: not every day    Review of Systems  10 Systems reviewed and all are negative for acute change except as noted in the HPI.  Allergies  Review of patient's allergies indicates no known allergies.  Home Medications   Prior to Admission medications   Medication Sig Start Date End Date Taking? Authorizing Provider  acetaminophen (TYLENOL) 500 MG tablet Take 500-1,000 mg by mouth every 6 (six) hours as needed for mild pain.     Historical Provider, MD  albuterol (PROVENTIL HFA;VENTOLIN HFA) 108 (90 BASE) MCG/ACT inhaler Inhale 1 puff into the lungs every 6 (six) hours as needed for wheezing or shortness of breath.    Historical Provider, MD  azithromycin  (ZITHROMAX) 500 MG tablet Take 1 tablet (500 mg total) by mouth daily with breakfast. 06/07/16   Collier Salina, MD  cefUROXime (CEFTIN) 500 MG tablet Take 1 tablet (500 mg total) by mouth 2 (two) times daily with a meal. 06/07/16   Collier Salina, MD  chlordiazePOXIDE (LIBRIUM) 25 MG capsule Take 2 capsules (50 mg total) by mouth daily. 06/08/16   Collier Salina, MD  potassium chloride (K-DUR) 10 MEQ tablet Take 4 tablets (40 mEq total) by mouth daily. 05/03/12 05/05/13  Na Li, MD   BP 129/90 mmHg  Pulse 101  Temp(Src) 98.4 F (36.9 C) (Oral)  Resp 18  SpO2 98% Physical Exam  Constitutional: He is oriented to person, place, and time. Vital signs are normal. He appears well-developed and well-nourished.  Non-toxic appearance. He does not appear ill. No distress.  Clinically intoxicated   HENT:  Head: Normocephalic and atraumatic.  Nose: Nose normal.  Mouth/Throat: Oropharynx is clear and moist. No oropharyngeal exudate.  Eyes: EOM are normal. Pupils are equal, round, and reactive to light. No scleral icterus.  Injected conjunctiva   Neck: Normal range of motion. Neck supple. No tracheal deviation, no edema, no erythema and normal range of motion present. No thyroid mass and no thyromegaly present.  Cardiovascular: Normal rate, regular rhythm, S1 normal, S2 normal, normal heart sounds, intact distal pulses and normal pulses.  Exam reveals no gallop and no friction rub.   No murmur heard. Pulmonary/Chest: Effort normal and breath sounds normal. No respiratory distress. He has no wheezes. He has no rhonchi. He has no rales.  Abdominal: Soft. Normal appearance and bowel sounds are normal. He exhibits no distension, no ascites and no mass. There is no hepatosplenomegaly. There is no tenderness. There is no rebound, no guarding and no CVA tenderness.  Musculoskeletal: Normal range of motion. He exhibits no edema or tenderness.  Lymphadenopathy:    He has no cervical adenopathy.   Neurological: He is alert and oriented to person, place, and time. He has normal strength. No cranial nerve deficit or sensory deficit.  Skin: Skin is warm, dry and intact. No petechiae and no rash noted. He is not diaphoretic. No erythema. No pallor.  Psychiatric: He has a normal mood and affect. His behavior is normal. Judgment normal.  Nursing note and vitals reviewed.   ED Course  Procedures (including critical care time) DIAGNOSTIC STUDIES: Oxygen Saturation is 98% on RA,  normal by my interpretation.    COORDINATION OF CARE: 2:54 AM Discussed treatment plan which includes a breathing treatment and albuterol inhaler with pt at bedside and pt agreed to plan.  Labs Review Labs Reviewed - No data to display  Imaging Review No results found.   EKG Interpretation None      MDM   Final diagnoses:  None   Patient no longer wheezing on my initial evaluation. States he was wheezing prior and does not have pump at home.  Will Dc home with pump to use as needed.  He is clinically sober, able to make decisions and ambulate on his own without any assistance. I do not have concern for any pneumonia. He appears well and in NAD. VS remain within her normal limits and she is safe for DC.   I personally performed the services described in this documentation, which was scribed in my presence. The recorded information has been reviewed and is accurate.      Everlene Balls, MD 06/24/16 2310

## 2016-11-22 ENCOUNTER — Emergency Department (HOSPITAL_COMMUNITY)
Admission: EM | Admit: 2016-11-22 | Discharge: 2016-11-22 | Disposition: A | Discharge status: Home / Self Care | Payer: Medicaid Other | Attending: Emergency Medicine | Admitting: Emergency Medicine

## 2016-11-22 ENCOUNTER — Emergency Department (HOSPITAL_COMMUNITY): Payer: Medicaid Other

## 2016-11-22 ENCOUNTER — Encounter (HOSPITAL_COMMUNITY): Payer: Self-pay | Admitting: Emergency Medicine

## 2016-11-22 DIAGNOSIS — Z8673 Personal history of transient ischemic attack (TIA), and cerebral infarction without residual deficits: Secondary | ICD-10-CM | POA: Diagnosis not present

## 2016-11-22 DIAGNOSIS — F17210 Nicotine dependence, cigarettes, uncomplicated: Secondary | ICD-10-CM | POA: Insufficient documentation

## 2016-11-22 DIAGNOSIS — R569 Unspecified convulsions: Secondary | ICD-10-CM | POA: Diagnosis not present

## 2016-11-22 DIAGNOSIS — D696 Thrombocytopenia, unspecified: Secondary | ICD-10-CM | POA: Insufficient documentation

## 2016-11-22 DIAGNOSIS — F10129 Alcohol abuse with intoxication, unspecified: Secondary | ICD-10-CM | POA: Diagnosis not present

## 2016-11-22 DIAGNOSIS — Z79899 Other long term (current) drug therapy: Secondary | ICD-10-CM | POA: Diagnosis not present

## 2016-11-22 DIAGNOSIS — R451 Restlessness and agitation: Secondary | ICD-10-CM | POA: Diagnosis present

## 2016-11-22 DIAGNOSIS — F10920 Alcohol use, unspecified with intoxication, uncomplicated: Secondary | ICD-10-CM

## 2016-11-22 LAB — CBC WITH DIFFERENTIAL/PLATELET
BASOS ABS: 0 10*3/uL (ref 0.0–0.1)
Basophils Relative: 0 %
EOS ABS: 0 10*3/uL (ref 0.0–0.7)
EOS PCT: 0 %
HCT: 39.9 % (ref 39.0–52.0)
Hemoglobin: 13.8 g/dL (ref 13.0–17.0)
Lymphocytes Relative: 39 %
Lymphs Abs: 1.8 10*3/uL (ref 0.7–4.0)
MCH: 35.3 pg — ABNORMAL HIGH (ref 26.0–34.0)
MCHC: 34.6 g/dL (ref 30.0–36.0)
MCV: 102 fL — ABNORMAL HIGH (ref 78.0–100.0)
Monocytes Absolute: 0.6 10*3/uL (ref 0.1–1.0)
Monocytes Relative: 12 %
Neutro Abs: 2.3 10*3/uL (ref 1.7–7.7)
Neutrophils Relative %: 49 %
PLATELETS: 44 10*3/uL — AB (ref 150–400)
RBC: 3.91 MIL/uL — AB (ref 4.22–5.81)
RDW: 14.2 % (ref 11.5–15.5)
WBC: 4.7 10*3/uL (ref 4.0–10.5)

## 2016-11-22 LAB — RAPID URINE DRUG SCREEN, HOSP PERFORMED
Amphetamines: NOT DETECTED
BENZODIAZEPINES: NOT DETECTED
Barbiturates: NOT DETECTED
Cocaine: NOT DETECTED
Opiates: NOT DETECTED
Tetrahydrocannabinol: NOT DETECTED

## 2016-11-22 LAB — COMPREHENSIVE METABOLIC PANEL
ALT: 54 U/L (ref 17–63)
AST: 281 U/L — ABNORMAL HIGH (ref 15–41)
Albumin: 3.3 g/dL — ABNORMAL LOW (ref 3.5–5.0)
Alkaline Phosphatase: 237 U/L — ABNORMAL HIGH (ref 38–126)
Anion gap: 15 (ref 5–15)
BUN: 5 mg/dL — ABNORMAL LOW (ref 6–20)
CO2: 21 mmol/L — ABNORMAL LOW (ref 22–32)
Calcium: 8.4 mg/dL — ABNORMAL LOW (ref 8.9–10.3)
Chloride: 104 mmol/L (ref 101–111)
Creatinine, Ser: 0.83 mg/dL (ref 0.61–1.24)
GFR calc Af Amer: >60 mL/min (ref >60)
GFR calc non Af Amer: >60 mL/min (ref >60)
Glucose, Bld: 100 mg/dL — ABNORMAL HIGH (ref 65–99)
Potassium: 3.5 mmol/L (ref 3.5–5.1)
Sodium: 140 mmol/L (ref 135–145)
Total Bilirubin: 0.5 mg/dL (ref 0.3–1.2)
Total Protein: 7.4 g/dL (ref 6.5–8.1)

## 2016-11-22 LAB — URINALYSIS, ROUTINE W REFLEX MICROSCOPIC
Bacteria, UA: NONE SEEN
Bilirubin Urine: NEGATIVE
GLUCOSE, UA: NEGATIVE mg/dL
Hgb urine dipstick: NEGATIVE
KETONES UR: NEGATIVE mg/dL
Leukocytes, UA: NEGATIVE
Nitrite: NEGATIVE
PROTEIN: 30 mg/dL — AB
RBC / HPF: NONE SEEN RBC/hpf (ref 0–5)
Specific Gravity, Urine: 1.006 (ref 1.005–1.030)
Squamous Epithelial / LPF: NONE SEEN
pH: 5 (ref 5.0–8.0)

## 2016-11-22 LAB — CBG MONITORING, ED: GLUCOSE-CAPILLARY: 87 mg/dL (ref 65–99)

## 2016-11-22 LAB — ETHANOL: Alcohol, Ethyl (B): 452 mg/dL (ref <5)

## 2016-11-22 MED ORDER — LEVETIRACETAM 500 MG/5ML IV SOLN
1000.0000 mg | Freq: Once | INTRAVENOUS | Status: AC
Start: 2016-11-22 — End: 2016-11-22
  Administered 2016-11-22: 1000 mg via INTRAVENOUS
  Filled 2016-11-22: qty 10

## 2016-11-22 MED ORDER — LEVETIRACETAM 500 MG PO TABS: 500.0000 mg | ORAL_TABLET | Freq: Two times a day (BID) | ORAL | 1 refills | Status: DC

## 2016-11-22 NOTE — ED Provider Notes (Signed)
CHIEF COMPLAINT: Possible seizure  HPI: Patient is a 35 year old male with history of alcohol abuse, history of seizures who presents emergency department after he had 3 witnessed seizures at home by friends. There is no one at bedside to confirm the story. This is what was told to the nursing staff by EMS. He complains of mild headache currently but no other complaints. He appears very intoxicated but denies drug or alcohol use. Denies any that he is on seizure medication and states "I will take some". Denies history of epilepsy for himself or family that he is on unreliable historian.  ROS: Level V caveat for intoxication  PAST MEDICAL HISTORY/PAST SURGICAL HISTORY:  Past Medical History:  Diagnosis Date  . Alcohol abuse   . Seizures ([place])   . Stroke [facility])     MEDICATIONS:  Prior to Admission medications   Medication Sig Start Date End Date Taking? Authorizing Provider  albuterol (PROVENTIL HFA;VENTOLIN HFA) 108 (90 BASE) MCG/ACT inhaler Inhale 1 puff into the lungs every 6 (six) hours as needed for wheezing or shortness of breath.    Historical Provider, MD  azithromycin (ZITHROMAX) 500 MG tablet Take 1 tablet (500 mg total) by mouth daily with breakfast. 06/07/16   Collier Salina, MD  cefUROXime (CEFTIN) 500 MG tablet Take 1 tablet (500 mg total) by mouth 2 (two) times daily with a meal. 06/07/16   Collier Salina, MD  chlordiazePOXIDE (LIBRIUM) 25 MG capsule Take 2 capsules (50 mg total) by mouth daily. 06/08/16   Collier Salina, MD  potassium chloride (K-DUR) 10 MEQ tablet Take 4 tablets (40 mEq total) by mouth daily. 05/03/12 05/05/13  Charlann Lange, MD    ALLERGIES:  No Known Allergies  SOCIAL HISTORY:  Social History  Substance Use Topics  . Smoking status: Current Every Day Smoker    Packs/day: 0.33    Years: 20.00    Types: Cigarettes  . Smokeless tobacco: Never Used  . Alcohol use 1.2 oz/week    2 Cans of beer per week     Comment: not every day    FAMILY  HISTORY: Family History  Problem Relation Age of Onset  . Heart attack Mother     Negative Hx    EXAM: BP 121/70 (BP Location: Left Arm)   Pulse 87   Temp 98 F (36.7 C) (Oral)   Resp 21   Ht 5\' 8"  (1.727 m)   Wt 220 lb (99.8 kg)   SpO2 98%   BMI 33.45 kg/m  CONSTITUTIONAL: Alert and oriented To person and place but not time, appears very intoxicated, slurred speech, smells of alcohol HEAD: Normocephalic, atraumatic EYES: Conjunctivae clear, PERRL, EOMI ENT: normal nose; no rhinorrhea; moist mucous membranes NECK: Supple, no meningismus, no nuchal rigidity, no LAD  CARD: RRR; S1 and S2 appreciated; no murmurs, no clicks, no rubs, no gallops RESP: Normal chest excursion without splinting or tachypnea; breath sounds clear and equal bilaterally; no wheezes, no rhonchi, no rales, no hypoxia or respiratory distress, speaking full sentences ABD/GI: Normal bowel sounds; non-distended; soft, non-tender, no rebound, no guarding, no peritoneal signs, no hepatosplenomegaly BACK:  The back appears normal and is non-tender to palpation, there is no CVA tenderness EXT: Left lower extremity AKA, Normal ROM in all joints; non-tender to palpation; no edema; normal capillary refill; no cyanosis, no calf tenderness or swelling    SKIN: Normal color for age and race; warm; no rash NEURO: Moves all extremities equally PSYCH: Patient comes is easily agitated  but is redirectable.  MEDICAL DECISION MAKING: Patient here with possible seizures.  No friends or family at bedside. He states he has had seizures before was unable to stop marijuana. States he does not take medication chronically. Denies benzodiazepine used also denies alcohol use and is clearly intoxicated today. No sign of trauma on exam. Appears to be neurologically intact. No complaints other than mild headache.  We'll obtain labs, urine, head CT. He will need to be monitored until clinically sober.  ED PROGRESS: 6:00 AM Labs unremarkable other  than thrombocytopenia which appears to be chronic for patient and an alcohol level of 452. He will need to be monitored for several hours and reassessed. Head CT pending.  7:40 AM  Pt's head CT shows no acute abnormalities. He is still very intoxicated and needs to sober up. It appears that he was seen in the emergency department in March for possible seizure. At that time he told the staff that he had a seizure 3 years ago. Given that this is at least his third seizure, I will start him on Keppra 500 twice a day and give him outpatient neurology follow-up. Anticipate discharge once patient is sober.   I reviewed all nursing notes, vitals, pertinent old records, EKGs, labs, imaging (as available).    EKG Interpretation  Date/Time:  Monday November 22 2016 03:53:16 EST Ventricular Rate:  84 PR Interval:    QRS Duration: 113 QT Interval:  387 QTC Calculation: 458 R Axis:   2 Text Interpretation:  NSR Abnormal R-wave progression, early transition Baseline wander in lead(s) V1 Artifact Confirmed by Cartier Washko,  DO, Chassity Ludke [id number]) on 11/22/2016 5:58:22 AM         [place], DO 11/22/16 [id number]

## 2016-11-22 NOTE — ED Triage Notes (Signed)
Per EMS pt had 3 seizures witnessed by friends. Pt was highly agitated and combative en route. Post ictal. VSS

## 2016-11-22 NOTE — ED Notes (Signed)
Pt refused to ambulate down hallway.

## 2016-11-22 NOTE — Discharge Instructions (Signed)
I recommend you start Keppra 500 mg twice a day given you have had multiple seizures. You need to follow-up with outpatient neurologist. You should not drive, operate machinery until you have been cleared by neurologist. Also recommended you seek help to stop drinking.  To find a primary care or specialty doctor please call [phone] or [phone] to access "[facility]."  You may also go on the [facility] website at [email or web address]  There are also multiple [facility], [facility], [facility] and [facility] practices throughout the [place] that are frequently accepting new patients. You may find a clinic that is close to your home and contact them.  [place] [phone] [place]  [place] [address] [address] [place] [phone]

## 2016-11-22 NOTE — ED Provider Notes (Signed)
Care assumed from Dr. Leonides Schanz at shift change. Patient brought here for evaluation of seizure-like activity. He has a history of this, however is been noncompliant with his medications. He arrived here heavily intoxicated with a blood alcohol of over 450. He has been observed for several hours and given an additional dose of IV Keppra in the emergency department. He will be discharged with a prescription for this and outpatient resource guide.   Veryl Speak, MD 11/22/16 [phone]

## 2016-12-19 ENCOUNTER — Emergency Department (HOSPITAL_COMMUNITY): Payer: Medicaid Other

## 2016-12-19 ENCOUNTER — Emergency Department (HOSPITAL_COMMUNITY)
Admission: EM | Admit: 2016-12-19 | Discharge: 2016-12-19 | Disposition: A | Discharge status: Home / Self Care | Payer: Medicaid Other | Attending: Emergency Medicine | Admitting: Emergency Medicine

## 2016-12-19 ENCOUNTER — Encounter (HOSPITAL_COMMUNITY): Payer: Self-pay

## 2016-12-19 DIAGNOSIS — S0083XA Contusion of other part of head, initial encounter: Secondary | ICD-10-CM

## 2016-12-19 DIAGNOSIS — J45909 Unspecified asthma, uncomplicated: Secondary | ICD-10-CM | POA: Diagnosis not present

## 2016-12-19 DIAGNOSIS — Y939 Activity, unspecified: Secondary | ICD-10-CM | POA: Diagnosis not present

## 2016-12-19 DIAGNOSIS — F10129 Alcohol abuse with intoxication, unspecified: Secondary | ICD-10-CM | POA: Insufficient documentation

## 2016-12-19 DIAGNOSIS — Z8673 Personal history of transient ischemic attack (TIA), and cerebral infarction without residual deficits: Secondary | ICD-10-CM | POA: Diagnosis not present

## 2016-12-19 DIAGNOSIS — R51 Headache: Secondary | ICD-10-CM | POA: Diagnosis not present

## 2016-12-19 DIAGNOSIS — Y929 Unspecified place or not applicable: Secondary | ICD-10-CM | POA: Insufficient documentation

## 2016-12-19 DIAGNOSIS — S20212A Contusion of left front wall of thorax, initial encounter: Secondary | ICD-10-CM | POA: Diagnosis not present

## 2016-12-19 DIAGNOSIS — Y999 Unspecified external cause status: Secondary | ICD-10-CM | POA: Diagnosis not present

## 2016-12-19 DIAGNOSIS — F10929 Alcohol use, unspecified with intoxication, unspecified: Secondary | ICD-10-CM

## 2016-12-19 DIAGNOSIS — S299XXA Unspecified injury of thorax, initial encounter: Secondary | ICD-10-CM | POA: Diagnosis present

## 2016-12-19 DIAGNOSIS — F1010 Alcohol abuse, uncomplicated: Secondary | ICD-10-CM

## 2016-12-19 DIAGNOSIS — F17210 Nicotine dependence, cigarettes, uncomplicated: Secondary | ICD-10-CM | POA: Diagnosis not present

## 2016-12-19 LAB — COMPREHENSIVE METABOLIC PANEL
ALT: 30 U/L (ref 17–63)
ANION GAP: 12 (ref 5–15)
AST: 57 U/L — ABNORMAL HIGH (ref 15–41)
Albumin: 3.6 g/dL (ref 3.5–5.0)
Alkaline Phosphatase: 116 U/L (ref 38–126)
BUN: <5 mg/dL — ABNORMAL LOW (ref 6–20)
CHLORIDE: 104 mmol/L (ref 101–111)
CO2: 23 mmol/L (ref 22–32)
Calcium: 8.8 mg/dL — ABNORMAL LOW (ref 8.9–10.3)
Creatinine, Ser: 1.03 mg/dL (ref 0.61–1.24)
Glucose, Bld: 93 mg/dL (ref 65–99)
Potassium: 3.7 mmol/L (ref 3.5–5.1)
SODIUM: 139 mmol/L (ref 135–145)
Total Bilirubin: 0.4 mg/dL (ref 0.3–1.2)
Total Protein: 8 g/dL (ref 6.5–8.1)

## 2016-12-19 LAB — CBC
HCT: 41.9 % (ref 39.0–52.0)
Hemoglobin: 14.9 g/dL (ref 13.0–17.0)
MCH: 35.4 pg — AB (ref 26.0–34.0)
MCHC: 35.6 g/dL (ref 30.0–36.0)
MCV: 99.5 fL (ref 78.0–100.0)
PLATELETS: 226 10*3/uL (ref 150–400)
RBC: 4.21 MIL/uL — AB (ref 4.22–5.81)
RDW: 14.2 % (ref 11.5–15.5)
WBC: 8.1 10*3/uL (ref 4.0–10.5)

## 2016-12-19 LAB — ETHANOL: Alcohol, Ethyl (B): 433 mg/dL (ref <5)
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
## 2016-12-19 NOTE — ED Notes (Signed)
Pt calling family to come and get him.  Dr Ashok Cordia stated pt ok to go home only if family comes.  Pt attempting to leave.  Security called.

## 2016-12-19 NOTE — Discharge Instructions (Signed)
It was our pleasure to provide your ER care today - we hope that you feel better.  Do not drink alcohol - follow up with AA, and use resource guide provided.  No driving for the next 12 hours, or any time when drinking alcohol.   Follow up with primary care doctor in the coming week.   Return to ER if worse, new symptoms, or other medical emergency.

## 2016-12-19 NOTE — ED Notes (Signed)
Pt sitting up in bed, stating LUQ, but stating he does not want pain meds.

## 2016-12-19 NOTE — ED Triage Notes (Signed)
Pt complaining of rib pain. Pt states front, back, left and right sides are hurting. Pt refusing to elaborate to this RN. Pt complaining SOB and chest pain. Pt refusing EKG at triage.

## 2017-07-31 ENCOUNTER — Encounter (HOSPITAL_COMMUNITY): Payer: Self-pay | Admitting: *Deleted

## 2017-07-31 ENCOUNTER — Emergency Department (HOSPITAL_COMMUNITY)
Admission: EM | Admit: 2017-07-31 | Discharge: 2017-07-31 | Disposition: A | Discharge status: Home / Self Care | Payer: Medicaid Other | Attending: Emergency Medicine | Admitting: Emergency Medicine

## 2017-07-31 DIAGNOSIS — R0602 Shortness of breath: Secondary | ICD-10-CM | POA: Insufficient documentation

## 2017-07-31 DIAGNOSIS — Z5321 Procedure and treatment not carried out due to patient leaving prior to being seen by health care provider: Secondary | ICD-10-CM | POA: Diagnosis not present

## 2017-07-31 NOTE — ED Notes (Signed)
Pt.want to leave stated he feels better.and he was leaving .

## 2017-07-31 NOTE — ED Triage Notes (Signed)
Pt arrived by [facility] for asthma and sob today. Has been out of his asthma meds x 2 days. Received 1 neb pta which helped relieve his symptoms. +etoh.

## 2017-09-21 ENCOUNTER — Emergency Department (HOSPITAL_COMMUNITY)
Admission: EM | Admit: 2017-09-21 | Discharge: 2017-09-21 | Discharge status: Against Medical Advice | Payer: Medicaid Other | Attending: Emergency Medicine | Admitting: Emergency Medicine

## 2017-09-21 ENCOUNTER — Emergency Department (HOSPITAL_COMMUNITY): Payer: Medicaid Other

## 2017-09-21 ENCOUNTER — Encounter (HOSPITAL_COMMUNITY): Payer: Self-pay | Admitting: Emergency Medicine

## 2017-09-21 DIAGNOSIS — F10920 Alcohol use, unspecified with intoxication, uncomplicated: Secondary | ICD-10-CM | POA: Diagnosis not present

## 2017-09-21 DIAGNOSIS — F17210 Nicotine dependence, cigarettes, uncomplicated: Secondary | ICD-10-CM | POA: Diagnosis not present

## 2017-09-21 DIAGNOSIS — R079 Chest pain, unspecified: Secondary | ICD-10-CM | POA: Diagnosis present

## 2017-09-21 DIAGNOSIS — R062 Wheezing: Secondary | ICD-10-CM | POA: Diagnosis not present

## 2017-09-21 DIAGNOSIS — R072 Precordial pain: Secondary | ICD-10-CM | POA: Insufficient documentation

## 2017-09-21 LAB — BASIC METABOLIC PANEL
Anion gap: 11 (ref 5–15)
CO2: 22 mmol/L (ref 22–32)
CREATININE: 0.92 mg/dL (ref 0.61–1.24)
Calcium: 8.6 mg/dL — ABNORMAL LOW (ref 8.9–10.3)
Chloride: 105 mmol/L (ref 101–111)
GFR calc Af Amer: >60 mL/min (ref >60)
GLUCOSE: 98 mg/dL (ref 65–99)
POTASSIUM: 3.5 mmol/L (ref 3.5–5.1)
SODIUM: 138 mmol/L (ref 135–145)

## 2017-09-21 LAB — CBC
HCT: 39.4 % (ref 39.0–52.0)
Hemoglobin: 13.7 g/dL (ref 13.0–17.0)
MCH: 33.6 pg (ref 26.0–34.0)
MCHC: 34.8 g/dL (ref 30.0–36.0)
MCV: 96.6 fL (ref 78.0–100.0)
PLATELETS: 49 10*3/uL — AB (ref 150–400)
RBC: 4.08 MIL/uL — ABNORMAL LOW (ref 4.22–5.81)
RDW: 15.3 % (ref 11.5–15.5)
WBC: 7.2 10*3/uL (ref 4.0–10.5)

## 2017-09-21 LAB — I-STAT TROPONIN, ED: Troponin i, poc: 0.01 ng/mL (ref 0.00–0.08)

## 2017-09-21 LAB — D-DIMER, QUANTITATIVE (NOT AT ARMC): D DIMER QUANT: 0.75 ug{FEU}/mL — AB (ref 0.00–0.50)

## 2017-09-21 LAB — ETHANOL: Alcohol, Ethyl (B): 364 mg/dL (ref <10)

## 2017-09-21 MED ORDER — SODIUM CHLORIDE 0.9 % IV BOLUS (SEPSIS)
1000.0000 mL | Freq: Once | INTRAVENOUS | Status: AC
  Administered 2017-09-21: 1000 mL via INTRAVENOUS

## 2017-09-21 MED ORDER — IOPAMIDOL (ISOVUE-370) INJECTION 76%
INTRAVENOUS | Status: AC
  Filled 2017-09-21: qty 100

## 2017-09-21 MED ORDER — IPRATROPIUM-ALBUTEROL 0.5-2.5 (3) MG/3ML IN SOLN
3.0000 mL | Freq: Once | RESPIRATORY_TRACT | Status: AC
  Administered 2017-09-21: 3 mL via RESPIRATORY_TRACT
  Filled 2017-09-21: qty 3

## 2017-09-21 NOTE — ED Notes (Signed)
Gave patient some water patient is resting with call bell in reach 

## 2017-09-21 NOTE — ED Notes (Signed)
Dr. Christy Gentles notified on pt.'s elevated ETOH level .

## 2017-09-21 NOTE — ED Triage Notes (Signed)
Patient with chest pain for the last two days.  Patient is nauseated with the pain.  No shortness of breath.  ETOH on board.  Patient states that the pain is sharp and all over his chest.

## 2017-09-21 NOTE — Discharge Instructions (Signed)
Your workup today showed an elevated d-dimer for which we wanted to do a CT scan to rule out Pulmonary Embolism, however; you did not want to get it. As we cannot rule out blood clot of your lungs currently, you will be leaving against our medical advice. Your alcohol was also very elevated. Please follow-up with your primary care physician for further management and reassessment. If any symptoms change or worsen, please return to the nearest emergency department.

## 2017-09-21 NOTE — ED Notes (Signed)
Pt refused CTA. Dr. Christy Gentles made aware.

## 2017-09-21 NOTE — ED Provider Notes (Signed)
[facility] DEPT Provider Note   CSN: [id number] Arrival date & time: 09/21/17  0002     History   Chief Complaint Chief Complaint  Patient presents with  . Chest Pain  . Nausea    HPI [name] is a 36 y.o. male.  The history is provided by the patient.  Chest Pain   This is a new problem. Episode onset: unknown. The problem occurs constantly. The problem has not changed since onset.Associated with: "hiccups" The pain is moderate. The pain does not radiate. Associated symptoms include a fever, shortness of breath and vomiting. He has tried nothing for the symptoms.  Patient with h/o alcohol abuse presents multiple complaints: He reports he began having sharp chest pain after "hiccupping"  It is unclear when the CP started He reports nausea/vomiting and he thinks he had a fever  History is difficult due to ETOH intoxication  Past Medical History:  Diagnosis Date  . Alcohol abuse   . Seizures ([place])   . Stroke [facility])     Patient Active Problem List   Diagnosis Date Noted  . Sepsis ([place]) 06/05/2016  . Thrombocytopenia ([place]) 06/05/2016  . Alcohol abuse with intoxication ([place]) 06/05/2016  . Fatty liver 06/05/2016  . Asthma 06/05/2016  . Fall at home 05/03/2012  . postural orthostatic tachycardia 05/03/2012  . Hypokalemia 05/03/2012  . Steatosis of liver 05/03/2012  . Gallstones 05/03/2012  . Thoracic ascending aortic aneurysm ([place]) 05/03/2012  . 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 Tobacco abuse 05/03/2012  . Syncope 04/27/2012  . Alcohol abuse 04/27/2012  . 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 Above knee amputation of left leg    . BELOW KNEE LEG AMPUTATION     LEFT  . below the knee amputation         Home Medications    Prior to Admission medications   Medication Sig Start Date End Date Taking? Authorizing Provider  chlordiazePOXIDE (LIBRIUM) 25 MG capsule Take 2 capsules (50 mg  total) by mouth daily. Patient not taking: Reported on 11/22/2016 06/08/16   Collier Salina, MD  levETIRAcetam (KEPPRA) 500 MG tablet Take 1 tablet (500 mg total) by mouth 2 (two) times daily. Patient not taking: Reported on 09/21/2017 11/22/16   Ward, Delice Bison, DO    Family History Family History  Problem Relation Age of Onset  . Heart attack Mother        Negative Hx    Social History Social History  Substance Use Topics  . Smoking status: Current Every Day Smoker    Packs/day: 0.33    Years: 20.00    Types: Cigarettes  . Smokeless tobacco: Never Used  . Alcohol use 1.2 oz/week    2 Cans of beer per week     Comment: not every day     Allergies   Patient has no known allergies.   Review of Systems Review of Systems  Constitutional: Positive for fever.  Respiratory: Positive for shortness of breath.   Cardiovascular: Positive for chest pain.  Gastrointestinal: Positive for vomiting.  All other systems reviewed and are negative.    Physical Exam Updated Vital Signs BP 115/71   Pulse (!) 110   Temp 98.4 F (36.9 C) (Oral)   Resp 17   SpO2 96%   Physical Exam CONSTITUTIONAL: Disheveled, smells of ETOH, leaning on side of bed HEAD: Normocephalic/atraumatic EYES: EOMI/PERRL ENMT: Mucous membranes moist NECK: supple no  meningeal signs SPINE/BACK:entire spine nontender CV: S1/S2 noted, tachycardic LUNGS: wheezing bilaterally, mild tachypnea noted ABDOMEN: soft, nontender, no rebound or guarding, bowel sounds noted throughout abdomen GU:no cva tenderness NEURO: Pt is awake/alert, moves all extremitiesx4.  No facial droop.   EXTREMITIES: pulses normal/equal, full ROM SKIN: warm, color normal PSYCH: unable to assess  ED Treatments / Results  Labs (all labs ordered are listed, but only abnormal results are displayed) Labs Reviewed  BASIC METABOLIC PANEL - Abnormal; Notable for the following:       Result Value   BUN <5 (*)    Calcium 8.6 (*)    All  other components within normal limits  CBC - Abnormal; Notable for the following:    RBC 4.08 (*)    Platelets 49 (*)    All other components within normal limits  D-DIMER, QUANTITATIVE (NOT AT [facility]) - Abnormal; Notable for the following:    D-Dimer, Quant 0.75 (*)    All other components within normal limits  ETHANOL - Abnormal; Notable for the following:    Alcohol, Ethyl (B) 364 (*)    All other components within normal limits  I-STAT TROPONIN, ED    EKG  EKG Interpretation  Date/Time:  Wednesday September 21 2017 00:04:57 EDT Ventricular Rate:  106 PR Interval:  144 QRS Duration: 96 QT Interval:  366 QTC Calculation: 486 R Axis:   17 Text Interpretation:  Sinus tachycardia Otherwise normal ECG Confirmed by Ripley Fraise [phone]) on 09/21/2017 5:19:57 AM Also confirmed by Ripley Fraise [phone]), editor Laurena Spies [phone])  on 09/21/2017 7:25:22 AM       Radiology Dg Chest 2 View  Result Date: 09/21/2017 CLINICAL DATA:  Chest pain and nausea tonight EXAM: CHEST  2 VIEW COMPARISON:  None FINDINGS: Minimal enlargement of cardiac silhouette. Mediastinal contours and pulmonary vascularity normal. Mild RIGHT basilar atelectasis. Lungs otherwise clear. No infiltrate, pleural effusion or pneumothorax. No acute osseous findings. IMPRESSION: No acute abnormalities. Electronically Signed   By: Lavonia Dana M.D.   On: 09/21/2017 00:41    Procedures Procedures   Medications Ordered in ED Medications  sodium chloride 0.9 % bolus 1,000 mL (1,000 mLs Intravenous New Bag/Given 09/21/17 0550)  ipratropium-albuterol (DUONEB) 0.5-2.5 (3) MG/3ML nebulizer solution 3 mL (3 mLs Nebulization Given 09/21/17 0553)     Initial Impression / Assessment and Plan / ED Course  I have reviewed the triage vital signs and the nursing notes.  Pertinent labs & imaging results that were available during my care of the patient were reviewed by me and considered in my medical decision making (see  chart for details).     7:30 AM Pt is difficult historian, clearly intoxicated, reporting chest pain after hiccuping and he is short of breath On exam, he is wheezing/tachypnea Also, when he has any movement he becomes tachycardic D-dimer elevated 7:32 AM Plan at signout to dr tegeler, f/u on CT chest  If negative, and patient improved and ambulatory he can be discharged  Final Clinical Impressions(s) / ED Diagnoses   Final diagnoses:  Wheezing  Alcoholic intoxication without complication ([employer])  Precordial pain    New Prescriptions New Prescriptions   No medications on file     Ripley Fraise, MD 09/21/17 [phone]

## 2017-09-21 NOTE — ED Provider Notes (Signed)
11:36 AM Care assumed from Dr. Christy Gentles. At time of transfer of care, patient is awaiting metabolism of alcohol as well as CT PE study to rule out pulmonary embolism in the setting of elevated d-dimer and chest pain.  Patient refused CT scan. Patient reevaluated and he appeared clinically sober. He was able tablet without difficulty. Feel patient has the ability to make this decision.  Given his clinical improvement and intoxication, patient wants to leave [name]. As he appears to have the capacity to make that decision, patient will be allowed to leave. Patient given instructions to follow-up with his PCP as well as return if any symptoms worsen. Patient had no other questions or concerns and left the emergency department in stable condition.    Date: 09/21/2017 Patient: [name]  [facility] or his authorized caregiver has made the decision for the patient to leave the emergency department against the advice of Tegeler, Gwenyth Allegra, *. He or his authorized caregiver has been informed about any test results if available and understands the inherent risks of leaving, including permanent disability or death. He or his authorized caregiver has decided to accept the responsibility for this decision. He or his authorized caregiver is not intoxicated and has demonstrated decision making capacity. [name] and all necessary parties have been advised that he may return for further evaluation or treatment at any time and they are recommended alternative follow up if continually unwilling. His condition at time of discharge was Stable.  Vital signs Blood pressure 123/78, pulse 83, temperature 98.4 F (36.9 C), temperature source Oral, resp. rate 17, SpO2 96 %.         Tegeler, Gwenyth Allegra, MD 09/21/17 [phone]

## 2017-09-21 NOTE — ED Notes (Signed)
Pt transported to CT. CT transporter made aware that pt is a fall risk and intoxicated.

## 2017-09-21 NOTE — ED Notes (Signed)
Pt ambulated with even gait, HR 115-120, SP O2 98-99% RA.  Pt became nauseous, 1 episode of vomiting.  Pt stated, "I never go this long without a drink."  MD made aware.

## 2017-09-21 NOTE — ED Notes (Signed)
POC discussed with EDP.  Pt requesting discharge.  EDP made aware.

## 2017-09-21 NOTE — ED Notes (Signed)
Patient is resting gave patient water call bell in reach

## 2018-05-21 ENCOUNTER — Encounter (HOSPITAL_COMMUNITY): Payer: Self-pay | Admitting: *Deleted

## 2018-05-21 ENCOUNTER — Emergency Department (HOSPITAL_COMMUNITY): Payer: Medicaid Other

## 2018-05-21 ENCOUNTER — Emergency Department (HOSPITAL_COMMUNITY)
Admission: EM | Admit: 2018-05-21 | Discharge: 2018-05-21 | Disposition: A | Discharge status: Home / Self Care | Payer: Medicaid Other | Attending: Emergency Medicine | Admitting: Emergency Medicine

## 2018-05-21 ENCOUNTER — Other Ambulatory Visit: Payer: Self-pay

## 2018-05-21 DIAGNOSIS — W109XXA Fall (on) (from) unspecified stairs and steps, initial encounter: Secondary | ICD-10-CM | POA: Insufficient documentation

## 2018-05-21 DIAGNOSIS — S2242XA Multiple fractures of ribs, left side, initial encounter for closed fracture: Secondary | ICD-10-CM | POA: Diagnosis not present

## 2018-05-21 DIAGNOSIS — F17210 Nicotine dependence, cigarettes, uncomplicated: Secondary | ICD-10-CM | POA: Diagnosis not present

## 2018-05-21 DIAGNOSIS — Y92009 Unspecified place in unspecified non-institutional (private) residence as the place of occurrence of the external cause: Secondary | ICD-10-CM | POA: Insufficient documentation

## 2018-05-21 DIAGNOSIS — Y939 Activity, unspecified: Secondary | ICD-10-CM | POA: Diagnosis not present

## 2018-05-21 DIAGNOSIS — Y999 Unspecified external cause status: Secondary | ICD-10-CM | POA: Diagnosis not present

## 2018-05-21 DIAGNOSIS — S0990XA Unspecified injury of head, initial encounter: Secondary | ICD-10-CM

## 2018-05-21 DIAGNOSIS — S161XXA Strain of muscle, fascia and tendon at neck level, initial encounter: Secondary | ICD-10-CM | POA: Insufficient documentation

## 2018-05-21 MED ORDER — ACETAMINOPHEN 325 MG PO TABS
650.0000 mg | ORAL_TABLET | Freq: Once | ORAL | Status: AC
Start: 2018-05-21 — End: 2018-05-21
  Administered 2018-05-21: 650 mg via ORAL
  Filled 2018-05-21: qty 2

## 2018-05-21 MED ORDER — IBUPROFEN 600 MG PO TABS: 600.0000 mg | ORAL_TABLET | Freq: Three times a day (TID) | ORAL | 0 refills | Status: DC | PRN

## 2018-05-21 MED ORDER — HYDROCODONE-ACETAMINOPHEN 5-325 MG PO TABS: 1.0000 | ORAL_TABLET | Freq: Four times a day (QID) | ORAL | 0 refills | Status: DC | PRN
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
## 2018-05-21 NOTE — ED Notes (Signed)
The  Questions left blank were not answered by the pt or in his records

## 2018-05-21 NOTE — ED Notes (Signed)
Pt stated he wanted to sleep for 2 more hours.  Notified he could sleep in waiting room for 2 hours.  Pt was able to place on prosthetics and ambulate to wheelchair on his own.  Given happy meal.
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
## 2018-05-21 NOTE — ED Notes (Signed)
Given incentive spirometer.  Pt refused to practice with rn, stating, "I've done this before, I don't need to show you how I can do it".

## 2018-05-24 ENCOUNTER — Emergency Department (HOSPITAL_COMMUNITY): Payer: Medicaid Other

## 2018-05-24 ENCOUNTER — Other Ambulatory Visit: Payer: Self-pay

## 2018-05-24 ENCOUNTER — Emergency Department (HOSPITAL_COMMUNITY)
Admission: EM | Admit: 2018-05-24 | Discharge: 2018-05-25 | Disposition: A | Discharge status: Home / Self Care | Payer: Medicaid Other | Attending: Emergency Medicine | Admitting: Emergency Medicine

## 2018-05-24 ENCOUNTER — Encounter (HOSPITAL_COMMUNITY): Payer: Self-pay | Admitting: *Deleted

## 2018-05-24 DIAGNOSIS — Z8673 Personal history of transient ischemic attack (TIA), and cerebral infarction without residual deficits: Secondary | ICD-10-CM | POA: Insufficient documentation

## 2018-05-24 DIAGNOSIS — R220 Localized swelling, mass and lump, head: Secondary | ICD-10-CM | POA: Insufficient documentation

## 2018-05-24 DIAGNOSIS — S20212A Contusion of left front wall of thorax, initial encounter: Secondary | ICD-10-CM

## 2018-05-24 DIAGNOSIS — F17210 Nicotine dependence, cigarettes, uncomplicated: Secondary | ICD-10-CM | POA: Diagnosis not present

## 2018-05-24 DIAGNOSIS — Y929 Unspecified place or not applicable: Secondary | ICD-10-CM | POA: Insufficient documentation

## 2018-05-24 DIAGNOSIS — S2242XA Multiple fractures of ribs, left side, initial encounter for closed fracture: Secondary | ICD-10-CM | POA: Insufficient documentation

## 2018-05-24 DIAGNOSIS — J45909 Unspecified asthma, uncomplicated: Secondary | ICD-10-CM | POA: Insufficient documentation

## 2018-05-24 DIAGNOSIS — W19XXXA Unspecified fall, initial encounter: Secondary | ICD-10-CM | POA: Diagnosis not present

## 2018-05-24 DIAGNOSIS — S299XXA Unspecified injury of thorax, initial encounter: Secondary | ICD-10-CM | POA: Diagnosis present

## 2018-05-24 DIAGNOSIS — Y939 Activity, unspecified: Secondary | ICD-10-CM | POA: Diagnosis not present

## 2018-05-24 DIAGNOSIS — Y998 Other external cause status: Secondary | ICD-10-CM | POA: Diagnosis not present
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
## 2018-05-24 NOTE — ED Notes (Signed)
Pt refusing to get in wheelchair for Xray

## 2018-05-25 MED ORDER — HYDROCODONE-ACETAMINOPHEN 5-325 MG PO TABS: 1.0000 | ORAL_TABLET | ORAL | 0 refills | Status: DC | PRN

## 2018-05-25 NOTE — Discharge Instructions (Addendum)
Use incentive spirometer as directed to avoid pneumonia. Take Norco for pain as directed. Make an appointment with your primary care doctor for recheck this week.

## 2018-05-25 NOTE — ED Provider Notes (Signed)
[facility] EMERGENCY DEPARTMENT Provider Note   CSN: [id number] Arrival date & time: 05/24/18  2209     History   Chief Complaint Chief Complaint  Patient presents with  . Rib Injury    HPI [name] is a 37 y.o. male.  Patient returns to the emergency department with complaint of facial swelling. He was seen and evaluated after a fall on 05/21/18 and found to have rib fractures on the left. On review of triage notes and EMS report, the patient states he came for persistent left chest wall pain. He did not initially complain of this to me. When asked about chest pain he then says his right chest is hurting. He continues with stating he just wants to sleep, "can't I just sleep"?   The history is provided by the patient. No language interpreter was used.    Past Medical History:  Diagnosis Date  . Alcohol abuse   . Seizures ([place])   . Stroke [facility])     Patient Active Problem List   Diagnosis Date Noted  . Sepsis ([place]) 06/05/2016  . Thrombocytopenia ([place]) 06/05/2016  . Alcohol abuse with intoxication ([place]) 06/05/2016  . Fatty liver 06/05/2016  . Asthma 06/05/2016  . Fall at home 05/03/2012  . postural orthostatic tachycardia 05/03/2012  . Hypokalemia 05/03/2012  . Steatosis of liver 05/03/2012  . Gallstones 05/03/2012  . Thoracic ascending aortic aneurysm ([place]) 05/03/2012  . 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 Tobacco abuse 05/03/2012  . Syncope 04/27/2012  . Alcohol abuse 04/27/2012  . 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 Above knee amputation of left leg    . BELOW KNEE LEG AMPUTATION     LEFT  . below the knee amputation          Home Medications    Prior to Admission medications   Medication Sig Start Date End Date Taking? Authorizing Provider  HYDROcodone-acetaminophen (NORCO/VICODIN) 5-325 MG tablet Take 1-2 tablets by mouth every 4 (four) hours as needed. 05/25/18    Charlann Lange, PA-C  ibuprofen (ADVIL,MOTRIN) 600 MG tablet Take 1 tablet (600 mg total) by mouth every 8 (eight) hours as needed for moderate pain. 05/21/18   Ripley Fraise, MD  levETIRAcetam (KEPPRA) 500 MG tablet Take 1 tablet (500 mg total) by mouth 2 (two) times daily. Patient not taking: Reported on 09/21/2017 11/22/16   Ward, Delice Bison, DO    Family History Family History  Problem Relation Age of Onset  . Heart attack Mother        Negative Hx    Social History Social History   Tobacco Use  . Smoking status: Current Every Day Smoker    Packs/day: 0.33    Years: 20.00    Pack years: 6.60    Types: Cigarettes  . Smokeless tobacco: Never Used  Substance Use Topics  . Alcohol use: Yes    Alcohol/week: 1.2 oz    Types: 2 Cans of beer per week    Comment: not every day  . Drug use: No     Allergies   Patient has no known allergies.   Review of Systems Review of Systems  HENT: Positive for facial swelling.   Respiratory: Negative.   Cardiovascular: Negative.   Gastrointestinal: Negative.   Musculoskeletal: Negative.   Skin: Negative.   Neurological: Negative.      Physical Exam Updated Vital Signs BP 127/88 (BP Location: Right Arm)  Pulse 94   Temp 97.9 F (36.6 C) (Oral)   Resp 17   SpO2 98%   Physical Exam  Constitutional: He is oriented to person, place, and time. He appears well-developed and well-nourished.  Acutely intoxicated and belligerent.   HENT:  Head: Normocephalic.  No facial swelling or discoloration.  Neck: Normal range of motion. Neck supple.  Cardiovascular: Normal rate and regular rhythm.  Pulmonary/Chest: Effort normal and breath sounds normal. He has no wheezes. He has no rales. He exhibits tenderness (Generalized chest wall tenderness. ).  Abdominal: Soft. Bowel sounds are normal. There is no tenderness. There is no rebound and no guarding.  Musculoskeletal: Normal range of motion.  Neurological: He is alert and oriented to  person, place, and time.  Skin: Skin is warm and dry. No rash noted.  Psychiatric: He has a normal mood and affect.     ED Treatments / Results  Labs (all labs ordered are listed, but only abnormal results are displayed) Labs Reviewed - No data to display  EKG None  Radiology Dg Chest 2 View  Result Date: 05/24/2018 CLINICAL DATA:  Pain to left ribs. EXAM: CHEST - 2 VIEW COMPARISON:  May 21, 2018 FINDINGS: No pneumothorax. Probable nipple shadow over the lateral left lung base. Previously identified rib fractures are not as well seen on this chest x-ray. The heart, hila, mediastinum, lungs, and pleura are unremarkable. IMPRESSION: No acute abnormalities noted. Nipple shadow over the lateral left lung base. Electronically Signed   By: Dorise Bullion III M.D   On: 05/24/2018 23:02    Procedures Procedures (including critical care time)  Medications Ordered in ED Medications - No data to display   Initial Impression / Assessment and Plan / ED Course  I have reviewed the triage vital signs and the nursing notes.  Pertinent labs & imaging results that were available during my care of the patient were reviewed by me and considered in my medical decision making (see chart for details).     Patient presents with differing complaints. Per triage, he reports left chest pain. To me he complains of facial swelling then of right chest pain. He reports he wants to be left alone to sleep.   VSS. Rib fractures are known. CXR tonight shows no pulmonary abnormality. He is felt stable for discharge home.   Final Clinical Impressions(s) / ED Diagnoses   Final diagnoses:  Contusion of left chest wall, initial encounter  Closed fracture of multiple ribs of left side, initial encounter    ED Discharge Orders        Ordered    HYDROcodone-acetaminophen (NORCO/VICODIN) 5-325 MG tablet  Every 4 hours PRN     05/25/18 0123       Charlann Lange, PA-C 05/25/18 4680    Ripley Fraise,  MD 05/25/18 [phone]

## 2018-08-18 ENCOUNTER — Emergency Department (HOSPITAL_COMMUNITY)
Admission: EM | Admit: 2018-08-18 | Discharge: 2018-08-18 | Disposition: A | Discharge status: Home / Self Care | Payer: Medicaid Other | Attending: Emergency Medicine | Admitting: Emergency Medicine

## 2018-08-18 ENCOUNTER — Emergency Department (HOSPITAL_COMMUNITY): Payer: Medicaid Other

## 2018-08-18 ENCOUNTER — Other Ambulatory Visit: Payer: Self-pay

## 2018-08-18 ENCOUNTER — Encounter (HOSPITAL_COMMUNITY): Payer: Self-pay | Admitting: Emergency Medicine

## 2018-08-18 DIAGNOSIS — W109XXA Fall (on) (from) unspecified stairs and steps, initial encounter: Secondary | ICD-10-CM | POA: Diagnosis not present

## 2018-08-18 DIAGNOSIS — S00511A Abrasion of lip, initial encounter: Secondary | ICD-10-CM | POA: Insufficient documentation

## 2018-08-18 DIAGNOSIS — Y999 Unspecified external cause status: Secondary | ICD-10-CM | POA: Diagnosis not present

## 2018-08-18 DIAGNOSIS — M545 Low back pain: Secondary | ICD-10-CM | POA: Insufficient documentation

## 2018-08-18 DIAGNOSIS — Y939 Activity, unspecified: Secondary | ICD-10-CM | POA: Diagnosis not present

## 2018-08-18 DIAGNOSIS — F17210 Nicotine dependence, cigarettes, uncomplicated: Secondary | ICD-10-CM | POA: Diagnosis not present

## 2018-08-18 DIAGNOSIS — W19XXXA Unspecified fall, initial encounter: Secondary | ICD-10-CM

## 2018-08-18 DIAGNOSIS — S0083XA Contusion of other part of head, initial encounter: Secondary | ICD-10-CM | POA: Insufficient documentation

## 2018-08-18 DIAGNOSIS — S40211A Abrasion of right shoulder, initial encounter: Secondary | ICD-10-CM | POA: Diagnosis not present

## 2018-08-18 DIAGNOSIS — Y929 Unspecified place or not applicable: Secondary | ICD-10-CM | POA: Diagnosis not present

## 2018-08-18 DIAGNOSIS — Z23 Encounter for immunization: Secondary | ICD-10-CM | POA: Insufficient documentation

## 2018-08-18 MED ORDER — BACITRACIN ZINC 500 UNIT/GM EX OINT
TOPICAL_OINTMENT | Freq: Every day | CUTANEOUS | Status: DC
  Administered 2018-08-18: 1 via TOPICAL
  Filled 2018-08-18: qty 0.9

## 2018-08-18 MED ORDER — ACETAMINOPHEN 325 MG PO TABS
650.0000 mg | ORAL_TABLET | Freq: Once | ORAL | Status: AC
  Administered 2018-08-18: 650 mg via ORAL
  Filled 2018-08-18: qty 2

## 2018-08-18 MED ORDER — LIDOCAINE 5 % EX PTCH
1.0000 | MEDICATED_PATCH | CUTANEOUS | Status: DC
  Administered 2018-08-18: 1 via TRANSDERMAL
  Filled 2018-08-18: qty 1

## 2018-08-18 MED ORDER — TETANUS-DIPHTH-ACELL PERTUSSIS 5-2.5-18.5 LF-MCG/0.5 IM SUSP
0.5000 mL | Freq: Once | INTRAMUSCULAR | Status: AC
  Administered 2018-08-18: 0.5 mL via INTRAMUSCULAR
  Filled 2018-08-18: qty 0.5

## 2018-08-18 NOTE — ED Provider Notes (Signed)
[place] DEPT Provider Note   CSN: [id number] Arrival date & time: 08/18/18  1636     History   Chief Complaint Chief Complaint  Patient presents with  . Fall  . Back Pain  . Facial Injury  . Facial Pain    HPI [name] is a 37 y.o. male.  HPI   Patient is a 37 year old male with a history of seizures, on Keppra, alcohol use, and status post left BKA presenting for fall 24 hours ago.  Patient reports that he was drinking yesterday, and fell down approximately 6 stairs, striking his face.  Patient reports he has not drank today, and he will binge drink approximately every 2 weeks but is not a daily drinker.  Patient denies taking blood thinning medications.  Patient is poor historian regarding the events surrounding the fall, but denies any precipitating event to the fall, and reports it is a mechanical fall.  Patient is reporting pain in his lower back, as well as right side of his face.  Patient denies any weakness or numbness in any extremities, visual disturbance, dizziness or lightheadedness, or balance problems since the fall.  Patient reports that he only inconsistently takes his Keppra rather than twice daily.  Patient does report he is regularly followed by primary care provider. Unknown last tetanus shot.  Past Medical History:  Diagnosis Date  . Alcohol abuse   . Seizures ([place])   . Stroke [facility])     Patient Active Problem List   Diagnosis Date Noted  . Sepsis ([place]) 06/05/2016  . Thrombocytopenia ([place]) 06/05/2016  . Alcohol abuse with intoxication ([place]) 06/05/2016  . Fatty liver 06/05/2016  . Asthma 06/05/2016  . Fall at home 05/03/2012  . postural orthostatic tachycardia 05/03/2012  . Hypokalemia 05/03/2012  . Steatosis of liver 05/03/2012  . Gallstones 05/03/2012  . Thoracic ascending aortic aneurysm ([place]) 05/03/2012  . 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 Tobacco abuse 05/03/2012  . Syncope 04/27/2012  . Alcohol  abuse 04/27/2012  . Hx of BKA, left ([place]) 04/27/2012    Past Surgical History:  Procedure Laterality Date  . Above knee amputation of left leg    . BELOW KNEE LEG AMPUTATION     LEFT  . below the knee amputation          Home Medications    Prior to Admission medications   Medication Sig Start Date End Date Taking? Authorizing Provider  levETIRAcetam (KEPPRA) 500 MG tablet Take 1 tablet (500 mg total) by mouth 2 (two) times daily. 11/22/16  Yes Ward, Delice Bison, DO  ibuprofen (ADVIL,MOTRIN) 600 MG tablet Take 1 tablet (600 mg total) by mouth every 8 (eight) hours as needed for moderate pain. Patient not taking: Reported on 08/18/2018 05/21/18   Ripley Fraise, MD    Family History Family History  Problem Relation Age of Onset  . Heart attack Mother        Negative Hx    Social History Social History   Tobacco Use  . Smoking status: Current Every Day Smoker    Packs/day: 0.33    Years: 20.00    Pack years: 6.60    Types: Cigarettes  . Smokeless tobacco: Never Used  Substance Use Topics  . Alcohol use: Yes    Alcohol/week: 2.0 standard drinks    Types: 2 Cans of beer per week    Comment: not every day  . Drug use: No     Allergies   Patient has  no known allergies.   Review of Systems Review of Systems  HENT: Negative for congestion, facial swelling, rhinorrhea, trouble swallowing and voice change.   Eyes: Negative for visual disturbance.  Respiratory: Negative for shortness of breath.   Cardiovascular: Negative for chest pain.  Gastrointestinal: Negative for abdominal distention, abdominal pain and vomiting.  Musculoskeletal: Positive for arthralgias and back pain. Negative for gait problem and joint swelling.  Skin: Positive for wound.  Neurological: Negative for weakness and numbness.  Psychiatric/Behavioral: Negative for confusion.  All other systems reviewed and are negative.    Physical Exam Updated Vital Signs BP 116/90   Pulse 88   Temp 98.2  F (36.8 C) (Oral)   Resp 18   Ht 5\' 9"  (1.753 m)   Wt 104.3 kg   SpO2 100%   BMI 33.97 kg/m   Physical Exam  Constitutional: He appears well-developed and well-nourished. No distress.  HENT:  Head: Normocephalic.  Mouth/Throat: Oropharynx is clear and moist.  Right side lip with abrasion and swelling. No laceration and no disruption of vermillion border.  Eyes: Pupils are equal, round, and reactive to light. Conjunctivae and EOM are normal.  Neck: Normal range of motion. Neck supple.  Cardiovascular: Normal rate, regular rhythm, S1 normal and S2 normal.  No murmur heard. Pulmonary/Chest: Effort normal and breath sounds normal. He has no wheezes. He has no rales.  No chest tenderness to palpation or ecchymosis over chest.  Abdominal: Soft. He exhibits no distension. There is no tenderness. There is no guarding.  Musculoskeletal: Normal range of motion. He exhibits no edema or deformity.  S/p left BKA. Stump intact w/ no TTP. Right leg w/o abrasions or TTP.  No midline tenderness of cervical, thoracic, or lumbar spine.  Patient has upper lumbar and lower thoracic paraspinal muscular tenderness to palpation.  Minor abrasion to right shoulder.  Right shoulder with full range of motion actively.  No bony tenderness.  Neurological: He is alert.  Mental Status:  Alert, oriented, thought content appropriate, able to give a coherent history. Speech fluent without evidence of aphasia. Able to follow 2 step commands without difficulty.  Cranial Nerves:  II:  Peripheral visual fields grossly normal, pupils equal, round, reactive to light III,IV, VI: ptosis not present, extra-ocular motions intact bilaterally  V,VII: smile symmetric, facial light touch sensation equal VIII: hearing grossly normal to voice  X: uvula elevates symmetrically  XI: bilateral shoulder shrug symmetric and strong XII: midline tongue extension without fassiculations Motor:  Normal tone. 5/5 in upper and lower  extremities including strong and equal grip strength and dorsiflexion/plantar flexion only on right (history of left BKA)  Cerebellar: normal finger-to-nose with bilateral upper extremities Gait: Normal and symmetric gait with no evidence of footdrop.   Skin: Skin is warm and dry. No erythema.  Abrasion noted to right shoulder.  Psychiatric: He has a normal mood and affect. His behavior is normal. Judgment and thought content normal.  Nursing note and vitals reviewed.    ED Treatments / Results  Labs (all labs ordered are listed, but only abnormal results are displayed) Labs Reviewed - No data to display  EKG None  Radiology Dg Chest 2 View  Result Date: 08/18/2018 CLINICAL DATA:  37 y/o  M; fall yesterday. Back pain. EXAM: CHEST - 2 VIEW COMPARISON:  12/19/2016 chest and rib radiographs. FINDINGS: Stable heart size and mediastinal contours are within normal limits. Both lungs are clear. The visualized skeletal structures are unremarkable. IMPRESSION: No acute process identified. Electronically  Signed   By: Kristine Garbe M.D.   On: 08/18/2018 19:53   Dg Thoracic Spine 2 View  Result Date: 08/18/2018 CLINICAL DATA:  37 y/o  M; fall yesterday.  Back pain. EXAM: THORACIC SPINE 2 VIEWS COMPARISON:  12/19/2016 chest and rib radiograph. FINDINGS: There is no evidence of thoracic spine fracture. Alignment is normal. Discogenic degenerative changes of the upper thoracic spine with endplate marginal osteophytes. With calcifications in right upper quadrant, likely cholelithiasis. IMPRESSION: 1. No acute fracture or dislocation identified. 2. Mild spondylosis of upper thoracic spine. 3. Cholelithiasis. Electronically Signed   By: Kristine Garbe M.D.   On: 08/18/2018 19:51   Dg Lumbar Spine Complete  Result Date: 08/18/2018 CLINICAL DATA:  37 y/o  M; fall yesterday. Back pain. EXAM: LUMBAR SPINE - COMPLETE 4+ VIEW COMPARISON:  None. FINDINGS: There is no evidence of lumbar spine  fracture. Alignment is normal. Intervertebral disc spaces are maintained. IMPRESSION: Negative. Electronically Signed   By: Kristine Garbe M.D.   On: 08/18/2018 19:52   Ct Head Wo Contrast  Result Date: 08/18/2018 CLINICAL DATA:  Status post fall with facial abrasions and pain. EXAM: CT HEAD WITHOUT CONTRAST CT MAXILLOFACIAL WITHOUT CONTRAST CT CERVICAL SPINE WITHOUT CONTRAST TECHNIQUE: Multidetector CT imaging of the head, cervical spine, and maxillofacial structures were performed using the standard protocol without intravenous contrast. Multiplanar CT image reconstructions of the cervical spine and maxillofacial structures were also generated. COMPARISON:  Head CT 12/19/2016 FINDINGS: CT HEAD FINDINGS Brain: No evidence of acute infarction, hemorrhage, hydrocephalus, extra-axial collection or mass lesion/mass effect. Vascular: No hyperdense vessel or unexpected calcification. Skull: Normal. Negative for fracture or focal lesion. Other: None. CT MAXILLOFACIAL FINDINGS Osseous: Prior left nasal fracture and subsequent nasal deformity. Likely posttraumatic irregularity of the nasal septum. No evidence of acute fracture. Orbits: Chronic deformity of the left medial orbital wall with fat herniation into the left ethmoid sinus. No acute fracture. Sinuses: Mild polypoid mucosal thickening of bilateral maxillary sinuses. No fractures seen. Soft tissues: Right temporal and preseptal periorbital soft tissue hematoma. CT CERVICAL SPINE FINDINGS Alignment: Normal. Skull base and vertebrae: No acute fracture. No primary bone lesion or focal pathologic process. Soft tissues and spinal canal: No prevertebral fluid or swelling. No visible canal hematoma. Disc levels:  Normal. Upper chest: Normal. Other: None. IMPRESSION: No acute intracranial abnormality. No evidence of acute traumatic injury to cervical spine. No acute facial fractures. Chronic left nasal and left medial orbital fractures. Electronically Signed    By: Fidela Salisbury M.D.   On: 08/18/2018 19:56   Ct Cervical Spine Wo Contrast  Result Date: 08/18/2018 CLINICAL DATA:  Status post fall with facial abrasions and pain. EXAM: CT HEAD WITHOUT CONTRAST CT MAXILLOFACIAL WITHOUT CONTRAST CT CERVICAL SPINE WITHOUT CONTRAST TECHNIQUE: Multidetector CT imaging of the head, cervical spine, and maxillofacial structures were performed using the standard protocol without intravenous contrast. Multiplanar CT image reconstructions of the cervical spine and maxillofacial structures were also generated. COMPARISON:  Head CT 12/19/2016 FINDINGS: CT HEAD FINDINGS Brain: No evidence of acute infarction, hemorrhage, hydrocephalus, extra-axial collection or mass lesion/mass effect. Vascular: No hyperdense vessel or unexpected calcification. Skull: Normal. Negative for fracture or focal lesion. Other: None. CT MAXILLOFACIAL FINDINGS Osseous: Prior left nasal fracture and subsequent nasal deformity. Likely posttraumatic irregularity of the nasal septum. No evidence of acute fracture. Orbits: Chronic deformity of the left medial orbital wall with fat herniation into the left ethmoid sinus. No acute fracture. Sinuses: Mild polypoid mucosal thickening of bilateral maxillary  sinuses. No fractures seen. Soft tissues: Right temporal and preseptal periorbital soft tissue hematoma. CT CERVICAL SPINE FINDINGS Alignment: Normal. Skull base and vertebrae: No acute fracture. No primary bone lesion or focal pathologic process. Soft tissues and spinal canal: No prevertebral fluid or swelling. No visible canal hematoma. Disc levels:  Normal. Upper chest: Normal. Other: None. IMPRESSION: No acute intracranial abnormality. No evidence of acute traumatic injury to cervical spine. No acute facial fractures. Chronic left nasal and left medial orbital fractures. Electronically Signed   By: Fidela Salisbury M.D.   On: 08/18/2018 19:56   Ct Maxillofacial Wo Cm  Result Date: 08/18/2018 CLINICAL  DATA:  Status post fall with facial abrasions and pain. EXAM: CT HEAD WITHOUT CONTRAST CT MAXILLOFACIAL WITHOUT CONTRAST CT CERVICAL SPINE WITHOUT CONTRAST TECHNIQUE: Multidetector CT imaging of the head, cervical spine, and maxillofacial structures were performed using the standard protocol without intravenous contrast. Multiplanar CT image reconstructions of the cervical spine and maxillofacial structures were also generated. COMPARISON:  Head CT 12/19/2016 FINDINGS: CT HEAD FINDINGS Brain: No evidence of acute infarction, hemorrhage, hydrocephalus, extra-axial collection or mass lesion/mass effect. Vascular: No hyperdense vessel or unexpected calcification. Skull: Normal. Negative for fracture or focal lesion. Other: None. CT MAXILLOFACIAL FINDINGS Osseous: Prior left nasal fracture and subsequent nasal deformity. Likely posttraumatic irregularity of the nasal septum. No evidence of acute fracture. Orbits: Chronic deformity of the left medial orbital wall with fat herniation into the left ethmoid sinus. No acute fracture. Sinuses: Mild polypoid mucosal thickening of bilateral maxillary sinuses. No fractures seen. Soft tissues: Right temporal and preseptal periorbital soft tissue hematoma. CT CERVICAL SPINE FINDINGS Alignment: Normal. Skull base and vertebrae: No acute fracture. No primary bone lesion or focal pathologic process. Soft tissues and spinal canal: No prevertebral fluid or swelling. No visible canal hematoma. Disc levels:  Normal. Upper chest: Normal. Other: None. IMPRESSION: No acute intracranial abnormality. No evidence of acute traumatic injury to cervical spine. No acute facial fractures. Chronic left nasal and left medial orbital fractures. Electronically Signed   By: Fidela Salisbury M.D.   On: 08/18/2018 19:56    Procedures Procedures (including critical care time)  Medications Ordered in ED Medications  bacitracin ointment (1 application Topical Given 08/18/18 1854)     Initial  Impression / Assessment and Plan / ED Course  I have reviewed the triage vital signs and the nursing notes.  Pertinent labs & imaging results that were available during my care of the patient were reviewed by me and considered in my medical decision making (see chart for details).     Patient is nontoxic-appearing, coherent, and neurologically intact on my examination.  Do have suspicion that patient was continuing to drink alcohol today, given that he was initially uncooperative with examination.  Patient has multiple abrasions of face, and right shoulder, as well as right side of lip, but none are amenable to primary closure.  Wounds were cleansed with saline, and antibiotic ointment applied.  All imaging including CT of head, cervical spine, maxillofacial, thoracic and lumbar spine, as well as chest are without any acute abnormalities.  Patient did demonstrate chronic left nasal bone fracture and left medial orbit fracture.  ENT follow-up was given to patient. Tdap was updated today.   Regarding patient's alcohol use, I discussed with patient if he was interested in learning about resources for quitting.  Patient reports that he does not drink daily, and does not feel that his alcohol use is problematic.  Patient was coherent, demonstrated clinical  sobriety, ambulatory, and tolerating p.o. at time of discharge.  Encouraged follow-up with primary care provider within 1 week for recheck.  Patient was given return precautions for any increasing pain, weakness or numbness in lower extremities, saddle anesthesia, loss of our bladder control, visual disturbance.  Patient is in understanding and agrees with the plan of care.  This is a supervised visit with Dr. Sherwood Gambler. Evaluation, management, and discharge planning discussed with this attending physician.  Final Clinical Impressions(s) / ED Diagnoses   Final diagnoses:  Fall, initial encounter  Contusion of face, initial encounter    ED  Discharge Orders    None       Tamala Julian 08/18/18 2322    Sherwood Gambler, MD 08/18/18 2340

## 2018-08-18 NOTE — ED Triage Notes (Signed)
Patient here from home with complaints of fall yesterday. Reports back pain and facial pain from injury. Abrasion noted to face and right shoulder.

## 2018-08-18 NOTE — ED Notes (Signed)
Pt left without his incentive spirometer.

## 2018-08-18 NOTE — ED Notes (Signed)
Declined discharge vitals. States that he has to catch the bus.

## 2018-08-18 NOTE — Discharge Instructions (Addendum)
Your imaging showed no evidence of fractures today.  Please use the incentive spirometer that we have given you with the instructions we show you.  This is to help improve lung expansion.   Please apply Salon PAS patches as needed for pain.  These are topical medications that will not harm liver or kidneys.  Return to the emergency department if you develop any increasing pain, weakness or numbness in your lower extremity's, numbness in your groin, loss of bowel or bladder control, vision changes, or persistent vomiting.  We updated tetanus today.  Please make sure you take your Keppra twice a day every day.  This will prevent seizures.

## 2020-05-23 ENCOUNTER — Inpatient Hospital Stay (HOSPITAL_COMMUNITY): Payer: Medicaid Other

## 2020-05-23 ENCOUNTER — Emergency Department (HOSPITAL_COMMUNITY): Payer: Medicaid Other

## 2020-05-23 ENCOUNTER — Inpatient Hospital Stay (HOSPITAL_COMMUNITY)
Admission: AD | Admit: 2020-05-23 | Discharge: 2020-06-07 | DRG: 981 | Disposition: A | Discharge status: Home Health | Payer: Medicaid Other | Attending: Internal Medicine | Admitting: Internal Medicine

## 2020-05-23 DIAGNOSIS — E876 Hypokalemia: Secondary | ICD-10-CM | POA: Diagnosis not present

## 2020-05-23 DIAGNOSIS — G9340 Encephalopathy, unspecified: Secondary | ICD-10-CM | POA: Diagnosis not present

## 2020-05-23 DIAGNOSIS — K760 Fatty (change of) liver, not elsewhere classified: Secondary | ICD-10-CM | POA: Diagnosis present

## 2020-05-23 DIAGNOSIS — Y92019 Unspecified place in single-family (private) house as the place of occurrence of the external cause: Secondary | ICD-10-CM | POA: Diagnosis not present

## 2020-05-23 DIAGNOSIS — E871 Hypo-osmolality and hyponatremia: Secondary | ICD-10-CM | POA: Diagnosis present

## 2020-05-23 DIAGNOSIS — Z419 Encounter for procedure for purposes other than remedying health state, unspecified: Secondary | ICD-10-CM | POA: Diagnosis not present

## 2020-05-23 DIAGNOSIS — J320 Chronic maxillary sinusitis: Secondary | ICD-10-CM | POA: Diagnosis present

## 2020-05-23 DIAGNOSIS — F79 Unspecified intellectual disabilities: Secondary | ICD-10-CM | POA: Diagnosis present

## 2020-05-23 DIAGNOSIS — Z720 Tobacco use: Secondary | ICD-10-CM | POA: Diagnosis not present

## 2020-05-23 DIAGNOSIS — Z9114 Patient's other noncompliance with medication regimen: Secondary | ICD-10-CM | POA: Diagnosis not present

## 2020-05-23 DIAGNOSIS — J9601 Acute respiratory failure with hypoxia: Secondary | ICD-10-CM | POA: Diagnosis present

## 2020-05-23 DIAGNOSIS — Z978 Presence of other specified devices: Secondary | ICD-10-CM

## 2020-05-23 DIAGNOSIS — Z8781 Personal history of (healed) traumatic fracture: Secondary | ICD-10-CM | POA: Diagnosis not present

## 2020-05-23 DIAGNOSIS — Z20822 Contact with and (suspected) exposure to covid-19: Secondary | ICD-10-CM | POA: Diagnosis present

## 2020-05-23 DIAGNOSIS — J9600 Acute respiratory failure, unspecified whether with hypoxia or hypercapnia: Secondary | ICD-10-CM | POA: Diagnosis not present

## 2020-05-23 DIAGNOSIS — Z89512 Acquired absence of left leg below knee: Secondary | ICD-10-CM | POA: Diagnosis not present

## 2020-05-23 DIAGNOSIS — Z6828 Body mass index (BMI) 28.0-28.9, adult: Secondary | ICD-10-CM

## 2020-05-23 DIAGNOSIS — J9690 Respiratory failure, unspecified, unspecified whether with hypoxia or hypercapnia: Secondary | ICD-10-CM | POA: Diagnosis present

## 2020-05-23 DIAGNOSIS — F15129 Other stimulant abuse with intoxication, unspecified: Secondary | ICD-10-CM | POA: Diagnosis present

## 2020-05-23 DIAGNOSIS — M6282 Rhabdomyolysis: Secondary | ICD-10-CM | POA: Diagnosis present

## 2020-05-23 DIAGNOSIS — I712 Thoracic aortic aneurysm, without rupture: Secondary | ICD-10-CM | POA: Diagnosis present

## 2020-05-23 DIAGNOSIS — G40909 Epilepsy, unspecified, not intractable, without status epilepticus: Secondary | ICD-10-CM | POA: Diagnosis present

## 2020-05-23 DIAGNOSIS — D6489 Other specified anemias: Secondary | ICD-10-CM | POA: Diagnosis present

## 2020-05-23 DIAGNOSIS — F17210 Nicotine dependence, cigarettes, uncomplicated: Secondary | ICD-10-CM | POA: Diagnosis present

## 2020-05-23 DIAGNOSIS — R509 Fever, unspecified: Secondary | ICD-10-CM | POA: Diagnosis not present

## 2020-05-23 DIAGNOSIS — N179 Acute kidney failure, unspecified: Secondary | ICD-10-CM | POA: Diagnosis present

## 2020-05-23 DIAGNOSIS — Z9119 Patient's noncompliance with other medical treatment and regimen: Secondary | ICD-10-CM

## 2020-05-23 DIAGNOSIS — J0130 Acute sphenoidal sinusitis, unspecified: Secondary | ICD-10-CM | POA: Diagnosis not present

## 2020-05-23 DIAGNOSIS — Z8249 Family history of ischemic heart disease and other diseases of the circulatory system: Secondary | ICD-10-CM | POA: Diagnosis not present

## 2020-05-23 DIAGNOSIS — F1010 Alcohol abuse, uncomplicated: Secondary | ICD-10-CM | POA: Diagnosis not present

## 2020-05-23 DIAGNOSIS — E872 Acidosis: Secondary | ICD-10-CM | POA: Diagnosis present

## 2020-05-23 DIAGNOSIS — S72142A Displaced intertrochanteric fracture of left femur, initial encounter for closed fracture: Secondary | ICD-10-CM | POA: Diagnosis present

## 2020-05-23 DIAGNOSIS — W1830XA Fall on same level, unspecified, initial encounter: Secondary | ICD-10-CM | POA: Diagnosis present

## 2020-05-23 DIAGNOSIS — F10231 Alcohol dependence with withdrawal delirium: Secondary | ICD-10-CM | POA: Diagnosis present

## 2020-05-23 DIAGNOSIS — I7121 Aneurysm of the ascending aorta, without rupture: Secondary | ICD-10-CM | POA: Diagnosis present

## 2020-05-23 DIAGNOSIS — E162 Hypoglycemia, unspecified: Secondary | ICD-10-CM | POA: Diagnosis not present

## 2020-05-23 DIAGNOSIS — J988 Other specified respiratory disorders: Secondary | ICD-10-CM | POA: Diagnosis not present

## 2020-05-23 DIAGNOSIS — Z781 Physical restraint status: Secondary | ICD-10-CM

## 2020-05-23 DIAGNOSIS — R7881 Bacteremia: Secondary | ICD-10-CM | POA: Diagnosis not present

## 2020-05-23 DIAGNOSIS — R569 Unspecified convulsions: Secondary | ICD-10-CM

## 2020-05-23 DIAGNOSIS — D649 Anemia, unspecified: Secondary | ICD-10-CM | POA: Diagnosis not present

## 2020-05-23 DIAGNOSIS — R5081 Fever presenting with conditions classified elsewhere: Secondary | ICD-10-CM | POA: Diagnosis not present

## 2020-05-23 DIAGNOSIS — G40101 Localization-related (focal) (partial) symptomatic epilepsy and epileptic syndromes with simple partial seizures, not intractable, with status epilepticus: Secondary | ICD-10-CM | POA: Diagnosis present

## 2020-05-23 DIAGNOSIS — I5021 Acute systolic (congestive) heart failure: Secondary | ICD-10-CM | POA: Diagnosis not present

## 2020-05-23 DIAGNOSIS — D696 Thrombocytopenia, unspecified: Secondary | ICD-10-CM | POA: Diagnosis not present

## 2020-05-23 DIAGNOSIS — E663 Overweight: Secondary | ICD-10-CM | POA: Diagnosis present

## 2020-05-23 DIAGNOSIS — T148XXA Other injury of unspecified body region, initial encounter: Secondary | ICD-10-CM

## 2020-05-23 DIAGNOSIS — R0602 Shortness of breath: Secondary | ICD-10-CM

## 2020-05-23 DIAGNOSIS — F1510 Other stimulant abuse, uncomplicated: Secondary | ICD-10-CM | POA: Diagnosis not present

## 2020-05-23 DIAGNOSIS — D62 Acute posthemorrhagic anemia: Secondary | ICD-10-CM | POA: Diagnosis present

## 2020-05-23 DIAGNOSIS — W19XXXA Unspecified fall, initial encounter: Secondary | ICD-10-CM

## 2020-05-23 DIAGNOSIS — K7460 Unspecified cirrhosis of liver: Secondary | ICD-10-CM

## 2020-05-23 DIAGNOSIS — R410 Disorientation, unspecified: Secondary | ICD-10-CM | POA: Diagnosis present

## 2020-05-23 DIAGNOSIS — J323 Chronic sphenoidal sinusitis: Secondary | ICD-10-CM | POA: Diagnosis present

## 2020-05-23 DIAGNOSIS — B958 Unspecified staphylococcus as the cause of diseases classified elsewhere: Secondary | ICD-10-CM | POA: Diagnosis not present

## 2020-05-23 DIAGNOSIS — F1590 Other stimulant use, unspecified, uncomplicated: Secondary | ICD-10-CM | POA: Diagnosis present

## 2020-05-23 DIAGNOSIS — Z4659 Encounter for fitting and adjustment of other gastrointestinal appliance and device: Secondary | ICD-10-CM

## 2020-05-23 DIAGNOSIS — J329 Chronic sinusitis, unspecified: Secondary | ICD-10-CM | POA: Diagnosis present

## 2020-05-23 LAB — RAPID URINE DRUG SCREEN, HOSP PERFORMED
Amphetamines: POSITIVE — AB
Barbiturates: NOT DETECTED
Benzodiazepines: NOT DETECTED
Cocaine: NOT DETECTED
Opiates: NOT DETECTED
Tetrahydrocannabinol: NOT DETECTED

## 2020-05-23 LAB — URINALYSIS, ROUTINE W REFLEX MICROSCOPIC
Glucose, UA: 50 mg/dL — AB
Hgb urine dipstick: NEGATIVE
Ketones, ur: 20 mg/dL — AB
Leukocytes,Ua: NEGATIVE
Nitrite: NEGATIVE
Protein, ur: 100 mg/dL — AB
Specific Gravity, Urine: 1.027 (ref 1.005–1.030)
pH: 5 (ref 5.0–8.0)

## 2020-05-23 LAB — LACTIC ACID, PLASMA
Lactic Acid, Venous: 8.3 mmol/L (ref 0.5–1.9)
Lactic Acid, Venous: 9.5 mmol/L (ref 0.5–1.9)

## 2020-05-23 LAB — CBC WITH DIFFERENTIAL/PLATELET
Abs Immature Granulocytes: 0.13 10*3/uL — ABNORMAL HIGH (ref 0.00–0.07)
Basophils Absolute: 0 10*3/uL (ref 0.0–0.1)
Basophils Relative: 0 %
Eosinophils Absolute: 0 10*3/uL (ref 0.0–0.5)
Eosinophils Relative: 0 %
HCT: 28.3 % — ABNORMAL LOW (ref 39.0–52.0)
Hemoglobin: 8.3 g/dL — ABNORMAL LOW (ref 13.0–17.0)
Immature Granulocytes: 1 %
Lymphocytes Relative: 11 %
Lymphs Abs: 1.5 10*3/uL (ref 0.7–4.0)
MCH: 26 pg (ref 26.0–34.0)
MCHC: 29.3 g/dL — ABNORMAL LOW (ref 30.0–36.0)
MCV: 88.7 fL (ref 80.0–100.0)
Monocytes Absolute: 1.5 10*3/uL — ABNORMAL HIGH (ref 0.1–1.0)
Monocytes Relative: 11 %
Neutro Abs: 10.2 10*3/uL — ABNORMAL HIGH (ref 1.7–7.7)
Neutrophils Relative %: 77 %
Platelets: UNDETERMINED 10*3/uL (ref 150–400)
RBC: 3.19 MIL/uL — ABNORMAL LOW (ref 4.22–5.81)
RDW: 21 % — ABNORMAL HIGH (ref 11.5–15.5)
WBC: 13.3 10*3/uL — ABNORMAL HIGH (ref 4.0–10.5)
nRBC: 1.1 % — ABNORMAL HIGH (ref 0.0–0.2)

## 2020-05-23 LAB — POCT I-STAT 7, (LYTES, BLD GAS, ICA,H+H)
Acid-base deficit: 1 mmol/L (ref 0.0–2.0)
Bicarbonate: 23.5 mmol/L (ref 20.0–28.0)
Calcium, Ion: 1.08 mmol/L — ABNORMAL LOW (ref 1.15–1.40)
HCT: 27 % — ABNORMAL LOW (ref 39.0–52.0)
Hemoglobin: 9.2 g/dL — ABNORMAL LOW (ref 13.0–17.0)
O2 Saturation: 100 %
Patient temperature: 99.4
Potassium: 3.4 mmol/L — ABNORMAL LOW (ref 3.5–5.1)
Sodium: 130 mmol/L — ABNORMAL LOW (ref 135–145)
TCO2: 25 mmol/L (ref 22–32)
pCO2 arterial: 39.6 mmHg (ref 32.0–48.0)
pH, Arterial: 7.382 (ref 7.350–7.450)
pO2, Arterial: 354 mmHg — ABNORMAL HIGH (ref 83.0–108.0)

## 2020-05-23 LAB — COMPREHENSIVE METABOLIC PANEL
ALT: 34 U/L (ref 0–44)
AST: 141 U/L — ABNORMAL HIGH (ref 15–41)
Albumin: 2.6 g/dL — ABNORMAL LOW (ref 3.5–5.0)
Alkaline Phosphatase: 178 U/L — ABNORMAL HIGH (ref 38–126)
Anion gap: 20 — ABNORMAL HIGH (ref 5–15)
BUN: 8 mg/dL (ref 6–20)
CO2: 14 mmol/L — ABNORMAL LOW (ref 22–32)
Calcium: 8.5 mg/dL — ABNORMAL LOW (ref 8.9–10.3)
Chloride: 97 mmol/L — ABNORMAL LOW (ref 98–111)
Creatinine, Ser: 1.49 mg/dL — ABNORMAL HIGH (ref 0.61–1.24)
GFR calc Af Amer: >60 mL/min (ref >60)
GFR calc non Af Amer: 58 mL/min — ABNORMAL LOW (ref >60)
Glucose, Bld: 94 mg/dL (ref 70–99)
Potassium: 4.5 mmol/L (ref 3.5–5.1)
Sodium: 131 mmol/L — ABNORMAL LOW (ref 135–145)
Total Bilirubin: 3.4 mg/dL — ABNORMAL HIGH (ref 0.3–1.2)
Total Protein: 7 g/dL (ref 6.5–8.1)

## 2020-05-23 LAB — CBG MONITORING, ED
Glucose-Capillary: 39 mg/dL — CL (ref 70–99)
Glucose-Capillary: 77 mg/dL (ref 70–99)

## 2020-05-23 LAB — ETHANOL: Alcohol, Ethyl (B): <10 mg/dL (ref <10)

## 2020-05-23 LAB — SARS CORONAVIRUS 2 BY RT PCR (HOSPITAL ORDER, PERFORMED IN ~~LOC~~ HOSPITAL LAB): SARS Coronavirus 2: NEGATIVE

## 2020-05-23 LAB — PROTIME-INR
INR: 1.4 — ABNORMAL HIGH (ref 0.8–1.2)
Prothrombin Time: 16.5 seconds — ABNORMAL HIGH (ref 11.4–15.2)

## 2020-05-23 LAB — ACETAMINOPHEN LEVEL: Acetaminophen (Tylenol), Serum: <10 ug/mL — ABNORMAL LOW (ref 10–30)

## 2020-05-23 LAB — APTT: aPTT: 35 seconds (ref 24–36)

## 2020-05-23 LAB — SALICYLATE LEVEL: Salicylate Lvl: <7 mg/dL — ABNORMAL LOW (ref 7.0–30.0)

## 2020-05-23 LAB — CK: Total CK: 405 U/L — ABNORMAL HIGH (ref 49–397)

## 2020-05-23 LAB — MRSA PCR SCREENING: MRSA by PCR: NEGATIVE

## 2020-05-23 LAB — AMMONIA: Ammonia: 46 umol/L — ABNORMAL HIGH (ref 9–35)

## 2020-05-23 MED ORDER — DEXMEDETOMIDINE HCL IN NACL 400 MCG/100ML IV SOLN
0.0000 ug/kg/h | INTRAVENOUS | Status: DC
  Filled 2020-05-23: qty 100

## 2020-05-23 MED ORDER — DOCUSATE SODIUM 100 MG PO CAPS: 100.0000 mg | ORAL_CAPSULE | Freq: Two times a day (BID) | ORAL | Status: DC | PRN

## 2020-05-23 MED ORDER — LACTATED RINGERS IV BOLUS
2000.0000 mL | Freq: Once | INTRAVENOUS | Status: AC
  Administered 2020-05-23: 2000 mL via INTRAVENOUS

## 2020-05-23 MED ORDER — DEXTROSE 50 % IV SOLN
INTRAVENOUS | Status: AC
  Filled 2020-05-23: qty 50

## 2020-05-23 MED ORDER — MIDAZOLAM HCL 2 MG/2ML IJ SOLN
2.0000 mg | INTRAMUSCULAR | Status: AC | PRN
  Administered 2020-05-23 – 2020-05-24 (×3): 2 mg via INTRAVENOUS
  Filled 2020-05-23 (×5): qty 2

## 2020-05-23 MED ORDER — PANTOPRAZOLE SODIUM 40 MG IV SOLR
40.0000 mg | Freq: Every day | INTRAVENOUS | Status: DC
  Administered 2020-05-23: 40 mg via INTRAVENOUS
  Filled 2020-05-23: qty 40

## 2020-05-23 MED ORDER — LORAZEPAM 2 MG/ML IJ SOLN
INTRAMUSCULAR | Status: AC
  Administered 2020-05-23: 2 mg via INTRAVENOUS
  Filled 2020-05-23: qty 1

## 2020-05-23 MED ORDER — MIDAZOLAM HCL 2 MG/2ML IJ SOLN
INTRAMUSCULAR | Status: AC
  Filled 2020-05-23: qty 4

## 2020-05-23 MED ORDER — DEXTROSE 50 % IV SOLN: 1.0000 | Freq: Once | INTRAVENOUS | Status: DC

## 2020-05-23 MED ORDER — SODIUM CHLORIDE 0.9 % IV SOLN
4000.0000 mg | INTRAVENOUS | Status: AC
  Administered 2020-05-23: 4000 mg via INTRAVENOUS
  Filled 2020-05-23 (×2): qty 40

## 2020-05-23 MED ORDER — SODIUM CHLORIDE 0.9 % IV SOLN
2.0000 g | Freq: Once | INTRAVENOUS | Status: DC
  Filled 2020-05-23: qty 20

## 2020-05-23 MED ORDER — LEVETIRACETAM IN NACL 1000 MG/100ML IV SOLN
1000.0000 mg | Freq: Two times a day (BID) | INTRAVENOUS | Status: DC
  Administered 2020-05-24 (×2): 1000 mg via INTRAVENOUS
  Filled 2020-05-23 (×2): qty 100

## 2020-05-23 MED ORDER — FENTANYL CITRATE (PF) 100 MCG/2ML IJ SOLN: 50.0000 ug | INTRAMUSCULAR | Status: DC | PRN

## 2020-05-23 MED ORDER — POLYETHYLENE GLYCOL 3350 17 G PO PACK: 17.0000 g | PACK | Freq: Every day | ORAL | Status: DC | PRN

## 2020-05-23 MED ORDER — LORAZEPAM 1 MG PO TABS: 0.5000 mg | ORAL_TABLET | Freq: Once | ORAL | Status: DC

## 2020-05-23 MED ORDER — LORAZEPAM 2 MG/ML IJ SOLN: 2.0000 mg | Freq: Once | INTRAMUSCULAR | Status: AC

## 2020-05-23 MED ORDER — SODIUM CHLORIDE 0.9 % IV SOLN
2.0000 g | Freq: Three times a day (TID) | INTRAVENOUS | Status: DC
  Administered 2020-05-23 – 2020-05-24 (×2): 2 g via INTRAVENOUS
  Filled 2020-05-23 (×4): qty 2

## 2020-05-23 MED ORDER — LACTATED RINGERS IV BOLUS
1000.0000 mL | Freq: Once | INTRAVENOUS | Status: AC
  Administered 2020-05-23: 1000 mL via INTRAVENOUS

## 2020-05-23 MED ORDER — PIPERACILLIN-TAZOBACTAM 3.375 G IVPB 30 MIN
3.3750 g | Freq: Once | INTRAVENOUS | Status: DC
  Filled 2020-05-23: qty 50

## 2020-05-23 MED ORDER — ORAL CARE MOUTH RINSE
15.0000 mL | OROMUCOSAL | Status: DC
  Administered 2020-05-23 – 2020-05-30 (×64): 15 mL via OROMUCOSAL

## 2020-05-23 MED ORDER — FENTANYL CITRATE (PF) 100 MCG/2ML IJ SOLN
50.0000 ug | INTRAMUSCULAR | Status: DC | PRN
  Administered 2020-05-24: 100 ug via INTRAVENOUS
  Administered 2020-05-24: 50 ug via INTRAVENOUS
  Administered 2020-05-24 – 2020-05-29 (×29): 100 ug via INTRAVENOUS
  Filled 2020-05-23 (×17): qty 2
  Filled 2020-05-23: qty 4
  Filled 2020-05-23 (×3): qty 2
  Filled 2020-05-23: qty 4
  Filled 2020-05-23 (×7): qty 2

## 2020-05-23 MED ORDER — CHLORHEXIDINE GLUCONATE CLOTH 2 % EX PADS
6.0000 | MEDICATED_PAD | Freq: Every day | CUTANEOUS | Status: DC
  Administered 2020-05-23 – 2020-06-02 (×10): 6 via TOPICAL

## 2020-05-23 MED ORDER — FENTANYL CITRATE (PF) 100 MCG/2ML IJ SOLN
INTRAMUSCULAR | Status: AC
  Filled 2020-05-23: qty 2

## 2020-05-23 MED ORDER — SODIUM CHLORIDE 0.9 % IV SOLN
2.0000 g | Freq: Once | INTRAVENOUS | Status: DC
  Filled 2020-05-23: qty 2

## 2020-05-23 MED ORDER — LORAZEPAM 2 MG/ML IJ SOLN
1.0000 mg | Freq: Once | INTRAMUSCULAR | Status: AC
  Administered 2020-05-23: 2 mg via INTRAVENOUS
  Filled 2020-05-23: qty 1

## 2020-05-23 MED ORDER — PROPOFOL 1000 MG/100ML IV EMUL
5.0000 ug/kg/min | INTRAVENOUS | Status: DC
  Administered 2020-05-23: 10 ug/kg/min via INTRAVENOUS
  Administered 2020-05-23: 50 ug/kg/min via INTRAVENOUS
  Administered 2020-05-24: 35 ug/kg/min via INTRAVENOUS
  Administered 2020-05-24: 40 ug/kg/min via INTRAVENOUS
  Administered 2020-05-24: 35 ug/kg/min via INTRAVENOUS
  Administered 2020-05-24: 50 ug/kg/min via INTRAVENOUS
  Administered 2020-05-25 (×3): 45 ug/kg/min via INTRAVENOUS
  Administered 2020-05-25 – 2020-05-26 (×3): 50 ug/kg/min via INTRAVENOUS
  Filled 2020-05-23 (×13): qty 100

## 2020-05-23 MED ORDER — VANCOMYCIN HCL IN DEXTROSE 1-5 GM/200ML-% IV SOLN
1000.0000 mg | Freq: Two times a day (BID) | INTRAVENOUS | Status: DC
  Administered 2020-05-24 – 2020-05-29 (×12): 1000 mg via INTRAVENOUS
  Filled 2020-05-23 (×12): qty 200

## 2020-05-23 MED ORDER — MIDAZOLAM HCL 2 MG/2ML IJ SOLN
2.0000 mg | INTRAMUSCULAR | Status: DC | PRN
  Administered 2020-05-24 – 2020-05-29 (×12): 2 mg via INTRAVENOUS
  Filled 2020-05-23 (×13): qty 2

## 2020-05-23 MED ORDER — DOCUSATE SODIUM 50 MG/5ML PO LIQD
100.0000 mg | Freq: Two times a day (BID) | ORAL | Status: DC
  Filled 2020-05-23: qty 10

## 2020-05-23 MED ORDER — HEPARIN SODIUM (PORCINE) 5000 UNIT/ML IJ SOLN
5000.0000 [IU] | Freq: Three times a day (TID) | INTRAMUSCULAR | Status: DC
  Administered 2020-05-23 – 2020-05-24 (×2): 5000 [IU] via SUBCUTANEOUS
  Filled 2020-05-23 (×2): qty 1

## 2020-05-23 MED ORDER — VANCOMYCIN HCL 1500 MG/300ML IV SOLN
1500.0000 mg | Freq: Once | INTRAVENOUS | Status: AC
  Administered 2020-05-23: 1500 mg via INTRAVENOUS
  Filled 2020-05-23: qty 300

## 2020-05-23 MED ORDER — CHLORHEXIDINE GLUCONATE 0.12% ORAL RINSE (MEDLINE KIT)
15.0000 mL | Freq: Two times a day (BID) | OROMUCOSAL | Status: DC
  Administered 2020-05-23 – 2020-05-30 (×14): 15 mL via OROMUCOSAL

## 2020-05-23 MED ORDER — POLYETHYLENE GLYCOL 3350 17 G PO PACK
17.0000 g | PACK | Freq: Every day | ORAL | Status: DC
  Filled 2020-05-23: qty 1

## 2020-05-23 NOTE — Progress Notes (Signed)
Ortho Trauma Progress Note  I was contacted by Pati Gallo, PA-C regarding this patient. Fall with left intertrochanteric femur fracture. He will require cephalomedullary nailing of left hip. Patient with altered mental status with fever, leukocytosis and lactic acidosis. Will tentatively place patient on surgery schedule for tomorrow pending medical workup. Formal orthopaedic consult to follow tomorrow AM.  Shona Needles, MD [facility] [phone] (office) [email or web address]

## 2020-05-23 NOTE — Progress Notes (Signed)
Secure chat with bedside RN to inquire about the status of antibiotic dosing.

## 2020-05-23 NOTE — Procedures (Signed)
Patient Name: Marquel Pottenger  MRN: [id number]  Epilepsy Attending: Lora Havens  Referring Physician/Provider: Dr Roland Rack Date: 05/23/2020 Duration: 24.20 mins  Patient history: 39 year old male with focal seizures with recurrent seizures without return to baseline, consistent with status epilepticus.  EEG to evaluate for seizure  Level of alertness: Lethargic  AEDs during EEG study: Versed, keppra, Propofol  Technical aspects: This EEG study was done with scalp electrodes positioned according to the 10-20 International system of electrode placement. Electrical activity was acquired at a sampling rate of 500Hz  and reviewed with a high frequency filter of 70Hz  and a low frequency filter of 1Hz . EEG data were recorded continuously and digitally stored.   Description: EEG showed continuous generalized 3 to 6 Hz theta-delta slowing admixed with 15 to 18 Hz beta activity with irregular morphology distributed symmetrically and diffusely.  Hyperventilation and photic stimulation were not performed.     ABNORMALITY -Excessive beta, generalized -Continuous slow, generalized  IMPRESSION: This study is suggestive of severe diffuse encephalopathy, nonspecific etiology but likely related to sedation. The excessive beta activity seen in the background is most likely due to the effect of benzodiazepine and is a benign EEG pattern. No seizures or epileptiform discharges were seen throughout the recording.  Maleeya Peterkin Barbra Sarks

## 2020-05-23 NOTE — Progress Notes (Signed)
LTM EEG hooked up and running - no initial skin breakdown - push button tested - neuro notified.  Same leads used.  

## 2020-05-23 NOTE — H&P (Signed)
NAME:  [name], MRN:  [id number], DOB:  Dec 09, 1981, LOS: 0 ADMISSION DATE:  05/23/2020, CONSULTATION DATE: 6/11 REFERRING MD: Dr. Maryan Rued, CHIEF COMPLAINT: Altered mental status  Brief History   39 year old male with history of alcohol abuse and seizures who presented 6/11 after 3 days without drinking and 2 days of seizures. He had deteriorating mental status in the emergency department concerning for alcohol withdrawal and ultimately required intubation. Workup also positive for fractured L hip.   History of present illness   39 year old male with PMH as below, which is significant for alcohol abuse, seizure (reportedly non-compliant with AED), and stroke. He is a daily drinker per his girlfriend, however, it is not known exactly how much. Girlfriend reports he stopped drinking just a few days prior to presentation and has been having seizures for two days. EMS called due to seizures. Upon arrival to the ED he was coherent and was able to articulate L hip pain. Imaging confirmed left intertrochanteric femur fracture. He was evaluated by orthopedics who  Have him planned for surgery on 6/12. As his course in the ED progressed he developed worsening delirium and agitation ultimately requiring intubation. PCCM asked to admit.   Past Medical History   has a past medical history of Alcohol abuse, Seizures ([place]), and Stroke ([place]).  Significant Hospital Events   Admit 6/11  Consults:  Neurology  Procedures:  ETT 6/11 >  Significant Diagnostic Tests:  Xray L hip : Comminuted intertrochanteric femur fracture on the left. CT head / Cspine 6/11 >  Micro Data:  Blood 6/11 > Urine 6/11 >  Antimicrobials:  Cefepime 6/11 > Vancomycin 6/11 \>  Interim history/subjective:    Objective   Blood pressure 113/84, pulse (!) 141, temperature 99.5 F (37.5 C), temperature source Oral, resp. rate (!) 26, SpO2 97 %.       No intake or output data in the 24 hours ending 05/23/20  1836 There were no vitals filed for this visit.  Examination: General: Thin middle aged male  HENT: 3cm laceration over the lateral aspect of the left eye, dried with blood.  Lungs: Clear bilateral breath sounds Cardiovascular: RRR, no MRG Abdomen: Soft, non-tender, nondistended Extremities: Left below the knee amputation Neuro: Lethargic, tremulous upper extremities, muttering unintelligible words, and upward gaze preference.  Resolved Hospital Problem list     Assessment & Plan:   Acute metabolic encephalopathy Alcohol withdrawal Methamphetamine intoxication -We will require intubation for airway protection -STAT CT of the head -Neurology consultation -Admit to ICU -Propofol for RASS goal -1 to -2  Seizure: with history of Keppra non-compliance - Restart home dose Keppra - EEG - Await neurology recommendations  Left femur fracture -Orthopedics planning for surgical repair on 6/12  Possible sepsis: Source unclear -Blood and urine cultures pending -Empiric antibiotics -Trend WBC and fevers  AKI Rhabdomyolysis Possible Hyponatremia Anion gap acidosis: lactic?  - IVF hydration (s/p 3 L in ER) - Follow BMP - Trend CK  Best practice:  Diet: NPO Pain/Anxiety/Delirium protocol (if indicated): Propofol for RASS goal VAP protocol (if indicated): Per protocol DVT prophylaxis: SQH GI prophylaxis: PPI Glucose control: SSI Mobility: BR Code Status: FULL Family Communication: Sister updated via phone Disposition: ICU  Labs   CBC: Recent Labs  Lab 05/23/20 1513  WBC 13.3*  NEUTROABS 10.2*  HGB 8.3*  HCT 28.3*  MCV 88.7  PLT PLATELET CLUMPS NOTED ON SMEAR, UNABLE TO ESTIMATE    Basic Metabolic Panel: Recent Labs  Lab  05/23/20 1513  NA 131*  K 4.5  CL 97*  CO2 14*  GLUCOSE 94  BUN 8  CREATININE 1.49*  CALCIUM 8.5*   GFR: CrCl cannot be calculated (Unknown ideal weight.). Recent Labs  Lab 05/23/20 1513  WBC 13.3*  LATICACIDVEN 8.3*    Liver  Function Tests: Recent Labs  Lab 05/23/20 1513  AST 141*  ALT 34  ALKPHOS 178*  BILITOT 3.4*  PROT 7.0  ALBUMIN 2.6*   No results for input(s): LIPASE, AMYLASE in the last 168 hours. Recent Labs  Lab 05/23/20 1514  AMMONIA 46*    ABG    Component Value Date/Time   TCO2 22 06/11/2016 0224     Coagulation Profile: Recent Labs  Lab 05/23/20 1513  INR 1.4*    Cardiac Enzymes: Recent Labs  Lab 05/23/20 1513  CKTOTAL 405*    HbA1C: No results found for: HGBA1C  CBG: Recent Labs  Lab 05/23/20 1746 05/23/20 1749  GLUCAP 39* 77    Review of Systems:   Patient is encephalopathic and/or intubated. Therefore history has been obtained from chart review.    Past Medical History  He,  has a past medical history of Alcohol abuse, Seizures ([place]), and Stroke ([place]).   Surgical History    Past Surgical History:  Procedure Laterality Date  . Above knee amputation of left leg    . BELOW KNEE LEG AMPUTATION     LEFT  . below the knee amputation       Social History   reports that he has been smoking cigarettes. He has a 6.60 pack-year smoking history. He has never used smokeless tobacco. He reports current alcohol use of about 2.0 standard drinks of alcohol per week. He reports that he does not use drugs.   Family History   His family history includes Heart attack in his mother.   Allergies No Known Allergies   Home Medications  Prior to Admission medications   Medication Sig Start Date End Date Taking? Authorizing Provider  ibuprofen (ADVIL,MOTRIN) 600 MG tablet Take 1 tablet (600 mg total) by mouth every 8 (eight) hours as needed for moderate pain. Patient not taking: Reported on 08/18/2018 05/21/18   Ripley Fraise, MD  levETIRAcetam (KEPPRA) 500 MG tablet Take 1 tablet (500 mg total) by mouth 2 (two) times daily. 11/22/16   Ward, Delice Bison, DO     Critical care time: 40 minutes     Georgann Housekeeper, AGACNP-BC [place]  for personal pager PCCM on call pager [phone]  05/23/2020 7:01 PM

## 2020-05-23 NOTE — Consult Note (Signed)
Neurology Consultation Reason for Consult: Status Epilepticus Referring Physician: Mcquaid, B  CC: Seizures  History is obtained from:chart review  HPI: [name] is a 39 y.o. male with a history of seizures and stroke who presents with recurrent seizures.  He fell leaving the bathroom earlier today and hurt his hip.  He had six seizures yesterday, does not see a neurologist or take seizure medicine.  Apparently someone has attributed them to alcohol in the past, however his sister describes them as focal, with shaking of the right side.  He does lose consciousness with it.  He has fallen and hurt his hip because of them over the past day.   ROS:  Unable to obtain due to altered mental status.   Past Medical History:  Diagnosis Date  . Alcohol abuse   . Seizures ([place])   . Stroke [facility])      Family History  Problem Relation Age of Onset  . Heart attack Mother        Negative Hx     Social History:  reports that he has been smoking cigarettes. He has a 6.60 pack-year smoking history. He has never used smokeless tobacco. He reports current alcohol use of about 2.0 standard drinks of alcohol per week. He reports that he does not use drugs.   Exam: Current vital signs: BP 113/84 (BP Location: Left Arm)   Pulse (!) 141   Temp 99.5 F (37.5 C) (Oral)   Resp (!) 26   SpO2 97%  Vital signs in last 24 hours: Temp:  [99.5 F (37.5 C)-100.4 F (38 C)] 99.5 F (37.5 C) (06/11 1811) Pulse Rate:  [133-141] 141 (06/11 1811) Resp:  [22-26] 26 (06/11 1811) BP: (113-123)/(83-84) 113/84 (06/11 1811) SpO2:  [83 %-97 %] 97 % (06/11 1811)   Physical Exam  Constitutional: Appears well-developed and well-nourished.  Psych: Affect appropriate to situation Eyes: No scleral injection HENT: No OP obstrucion MSK: no joint deformities.  Cardiovascular: Normal rate and regular rhythm.  Respiratory: Effort normal, non-labored breathing GI: Soft.  No distension. There is no tenderness.   Skin: WDI  Neuro: Mental Status: Patient arouses to noxious stimulation, however his speech is nonsensical (formed words, but not appropriate) he does not follow commands. Cranial Nerves: II: He does not blink to threat pupils are equal, round, and reactive to light.   III,IV, VI: He appears to have a right gaze preference, but does cross midline to the left slightly V: He has abnormal almost tar dive-like movements affecting predominantly the left face Motor: His left arm is held in dystonic posturing, he withdraws all extremities to noxious stimulation Sensory: As above Deep Tendon Reflexes: 2+ in the biceps and patellae.  Cerebellar: Does not perform     I have reviewed labs in epic and the results pertinent to this consultation are: Positive for amphetamines Sodium 131    I have reviewed the images obtained: Previous head CT from 2019, no definite seizure focus on my review.  Impression: 39 year old male with focal seizures with recurrent seizures without return to baseline, consistent with status epilepticus.  He is being intubated for airway protection.  With his fall, clearly head imaging is indicated, but with his previous history of seizures I suspect that this is more likely to be breakthrough seizure secondary to amphetamine use.  Recommendations: 1) Keppra 4 g x 1 followed by 1 g twice daily 2) stat EEG with continuous monitoring 3) titrate propofol to cessation of movements 4) neurology will follow  This patient is critically ill and at significant risk of neurological worsening, death and care requires constant monitoring of vital signs, hemodynamics,respiratory and cardiac monitoring, neurological assessment, discussion with family, other specialists and medical decision making of high complexity. I spent 35 minutes of neurocritical care time  in the care of  this patient. This was time spent independent of any time provided by nurse practitioner or PA.  Roland Rack, MD [facility] [phone]  If 7pm- 7am, please page neurology on call as listed in [place]. 05/23/2020  7:20 PM

## 2020-05-23 NOTE — Progress Notes (Signed)
STAT EEG complete - results pending. LTM to follow.  

## 2020-05-23 NOTE — ED Provider Notes (Signed)
[facility] EMERGENCY DEPARTMENT Provider Note   CSN: [id number] Arrival date & time: 05/23/20  1448     History No chief complaint on file.   [name] is a 39 y.o. male.  HPI Patient is a 39 year old male with past medical history of alcohol abuse, seizures, sepsis, thrombocytopenia, asthma, hypokalemia, history of mental retardation, history of left BKA  Patient states he has felt weak and fatigued for the past 2 days.  He states that he has been having seizures and had 6 seizures yesterday.  He states he has had seizures before but has not ever been put on medication.  Patient states he takes no medications at all.  Patient is denying any drug use states that he drinks 2 beers a day but has not had any alcohol in a week.  He states he has been shaky and off balance.  He denies any fever but was noted to have a temperature by EMS in route.  Patient denies any nausea, vomiting, diarrhea.  Denies any chest pain or shortness of breath.  I was unable to get in contact with patient's family present significant other initially.  Before admission to the hospital ICU was able to contact both his sister and SO.  I discussed with patient sister who states that he drinks at least 3 beers daily. I later discussed with [facility] the patient's significant other who states that he drinks at least 5 beers a day.  She has not noticed him not drinking over the last week and states that he was released drinking 2 days ago.  She states that over the past 2 days she has noticed him being more confused but he has not been coughing, nauseous or vomiting but he has appeared to be so more anxious and shaky.    Past Medical History:  Diagnosis Date  . Alcohol abuse   . Seizures ([place])   . Stroke [facility])     Patient Active Problem List   Diagnosis Date Noted  . Seizure ([place]) 05/23/2020  . Sepsis ([place]) 06/05/2016  . Thrombocytopenia ([place]) 06/05/2016  . Alcohol abuse with intoxication  ([place]) 06/05/2016  . Fatty liver 06/05/2016  . Asthma 06/05/2016  . Fall at home 05/03/2012  . postural orthostatic tachycardia 05/03/2012  . Hypokalemia 05/03/2012  . Steatosis of liver 05/03/2012  . Gallstones 05/03/2012  . Thoracic ascending aortic aneurysm ([place]) 05/03/2012  . 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 Tobacco abuse 05/03/2012  . Syncope 04/27/2012  . Alcohol abuse 04/27/2012  . Hx of BKA, left ([place]) 04/27/2012    Past Surgical History:  Procedure Laterality Date  . Above knee amputation of left leg    . BELOW KNEE LEG AMPUTATION     LEFT  . below the knee amputation         Family History  Problem Relation Age of Onset  . Heart attack Mother        Negative Hx    Social History   Tobacco Use  . Smoking status: Current Every Day Smoker    Packs/day: 0.33    Years: 20.00    Pack years: 6.60    Types: Cigarettes  . Smokeless tobacco: Never Used  Substance Use Topics  . Alcohol use: Yes    Alcohol/week: 2.0 standard drinks    Types: 2 Cans of beer per week    Comment: not every day  . Drug use: No    Home Medications Prior to Admission medications  Medication Sig Start Date End Date Taking? Authorizing Provider  ibuprofen (ADVIL,MOTRIN) 600 MG tablet Take 1 tablet (600 mg total) by mouth every 8 (eight) hours as needed for moderate pain. Patient not taking: Reported on 08/18/2018 05/21/18   Ripley Fraise, MD  levETIRAcetam (KEPPRA) 500 MG tablet Take 1 tablet (500 mg total) by mouth 2 (two) times daily. Patient not taking: Reported on 05/24/2020 11/22/16   Ward, Delice Bison, DO    Allergies    Patient has no known allergies.  Review of Systems   Review of Systems  Constitutional: Positive for chills, fatigue and fever.  HENT: Negative for congestion.   Eyes: Negative for pain.  Respiratory: Negative for cough and shortness of breath.   Cardiovascular: Negative for chest pain and leg swelling.  Gastrointestinal: Negative for abdominal  pain and vomiting.  Genitourinary: Negative for dysuria.  Musculoskeletal: Negative for myalgias.       Left hip pain  Skin: Negative for rash.  Neurological: Positive for headaches. Negative for dizziness.  Psychiatric/Behavioral: Positive for agitation and confusion.    Physical Exam Updated Vital Signs BP 113/71   Pulse 97   Temp (!) 101 F (38.3 C) (Axillary)   Resp (!) 22   Ht '5\' 8"'$  (1.727 m)   Wt 79.3 kg   SpO2 100%   BMI 26.58 kg/m   Physical Exam Vitals and nursing note reviewed.  Constitutional:      Appearance: He is ill-appearing.     Comments: Patient is anxious appearing 39 year old male appears somewhat older than stated age he is shivering/trembling.  He seems somewhat paranoid is warm to touch.  HENT:     Head: Normocephalic and atraumatic.     Nose: Nose normal.     Mouth/Throat:     Mouth: Mucous membranes are dry.  Eyes:     General: No scleral icterus.    Extraocular Movements: Extraocular movements intact.     Pupils: Pupils are equal, round, and reactive to light.  Cardiovascular:     Rate and Rhythm: Normal rate and regular rhythm.     Pulses: Normal pulses.     Heart sounds: Normal heart sounds.  Pulmonary:     Effort: Pulmonary effort is normal. No respiratory distress.     Breath sounds: No wheezing.  Abdominal:     Palpations: Abdomen is soft.     Tenderness: There is no abdominal tenderness. There is no right CVA tenderness, left CVA tenderness, guarding or rebound.  Musculoskeletal:     Cervical back: Normal range of motion and neck supple. No tenderness.     Right lower leg: No edema.     Left lower leg: No edema.     Comments: Significant left-sided hip tenderness to palpation. Left-sided BKA.  Skin:    General: Skin is warm and dry.     Capillary Refill: Capillary refill takes less than 2 seconds.     Comments: Diffuse bruising of the upper extremities. Small superficial abrasion/laceration to the left forehead  Neurological:      Mental Status: He is alert. Mental status is at baseline.     Comments: [name] is unable to explain what day of the week or month it is  Speech is clear without dysarthria or dysphagia but tremulous.  Strength 5/5 in upper/lower extremities  Sensation intact in upper/lower extremities   Normal gait.  Negative Romberg. No pronator drift.  Normal finger-to-nose and feet tapping.  CN I not tested  CN II grossly  intact visual fields bilaterally. Did not visualize posterior eye.   CN III, IV, VI PERRLA and EOMs intact bilaterally  CN V Intact sensation to sharp and light touch to the face  CN VII facial movements symmetric  CN VIII not tested  CN IX, X no uvula deviation, symmetric rise of soft palate  CN XI 5/5 SCM and trapezius strength bilaterally  CN XII Midline tongue protrusion, symmetric L/R movements   Psychiatric:        Mood and Affect: Mood normal.        Behavior: Behavior normal.     ED Results / Procedures / Treatments   Labs (all labs ordered are listed, but only abnormal results are displayed) Labs Reviewed  URINE CULTURE - Abnormal; Notable for the following components:      Result Value   Culture 5,000 COLONIES/mL STAPHYLOCOCCUS HAEMOLYTICUS (*)    All other components within normal limits  BLOOD CULTURE ID PANEL (REFLEXED) - Abnormal; Notable for the following components:   Staphylococcus species DETECTED (*)    Methicillin resistance DETECTED (*)    All other components within normal limits  LACTIC ACID, PLASMA - Abnormal; Notable for the following components:   Lactic Acid, Venous 8.3 (*)    All other components within normal limits  LACTIC ACID, PLASMA - Abnormal; Notable for the following components:   Lactic Acid, Venous 9.5 (*)    All other components within normal limits  COMPREHENSIVE METABOLIC PANEL - Abnormal; Notable for the following components:   Sodium 131 (*)    Chloride 97 (*)    CO2 14 (*)    Creatinine, Ser 1.49 (*)    Calcium 8.5  (*)    Albumin 2.6 (*)    AST 141 (*)    Alkaline Phosphatase 178 (*)    Total Bilirubin 3.4 (*)    GFR calc non Af Amer 58 (*)    Anion gap 20 (*)    All other components within normal limits  CBC WITH DIFFERENTIAL/PLATELET - Abnormal; Notable for the following components:   WBC 13.3 (*)    RBC 3.19 (*)    Hemoglobin 8.3 (*)    HCT 28.3 (*)    MCHC 29.3 (*)    RDW 21.0 (*)    nRBC 1.1 (*)    Neutro Abs 10.2 (*)    Monocytes Absolute 1.5 (*)    Abs Immature Granulocytes 0.13 (*)    All other components within normal limits  PROTIME-INR - Abnormal; Notable for the following components:   Prothrombin Time 16.5 (*)    INR 1.4 (*)    All other components within normal limits  URINALYSIS, ROUTINE W REFLEX MICROSCOPIC - Abnormal; Notable for the following components:   Color, Urine AMBER (*)    APPearance HAZY (*)    Glucose, UA 50 (*)    Bilirubin Urine SMALL (*)    Ketones, ur 20 (*)    Protein, ur 100 (*)    Bacteria, UA RARE (*)    All other components within normal limits  AMMONIA - Abnormal; Notable for the following components:   Ammonia 46 (*)    All other components within normal limits  RAPID URINE DRUG SCREEN, HOSP PERFORMED - Abnormal; Notable for the following components:   Amphetamines POSITIVE (*)    All other components within normal limits  CK - Abnormal; Notable for the following components:   Total CK 405 (*)    All other components within normal limits  SALICYLATE LEVEL - Abnormal; Notable for the following components:   Salicylate Lvl <7.0 (*)    All other components within normal limits  ACETAMINOPHEN LEVEL - Abnormal; Notable for the following components:   Acetaminophen (Tylenol), Serum <10 (*)    All other components within normal limits  BASIC METABOLIC PANEL - Abnormal; Notable for the following components:   Sodium 131 (*)    CO2 21 (*)    Creatinine, Ser 1.31 (*)    Calcium 7.8 (*)    All other components within normal limits  CBC -  Abnormal; Notable for the following components:   WBC 11.8 (*)    RBC 2.60 (*)    Hemoglobin 6.8 (*)    HCT 22.9 (*)    MCHC 29.7 (*)    RDW 20.3 (*)    nRBC 0.3 (*)    All other components within normal limits  GLUCOSE, CAPILLARY - Abnormal; Notable for the following components:   Glucose-Capillary 114 (*)    All other components within normal limits  GLUCOSE, CAPILLARY - Abnormal; Notable for the following components:   Glucose-Capillary 108 (*)    All other components within normal limits  GLUCOSE, CAPILLARY - Abnormal; Notable for the following components:   Glucose-Capillary 119 (*)    All other components within normal limits  CBC - Abnormal; Notable for the following components:   WBC 12.2 (*)    RBC 2.93 (*)    Hemoglobin 7.8 (*)    HCT 25.1 (*)    RDW 19.1 (*)    Platelets 86 (*)    nRBC 0.7 (*)    All other components within normal limits  GLUCOSE, CAPILLARY - Abnormal; Notable for the following components:   Glucose-Capillary 121 (*)    All other components within normal limits  CBG MONITORING, ED - Abnormal; Notable for the following components:   Glucose-Capillary 39 (*)    All other components within normal limits  POCT I-STAT 7, (LYTES, BLD GAS, ICA,H+H) - Abnormal; Notable for the following components:   pO2, Arterial 354 (*)    Sodium 130 (*)    Potassium 3.4 (*)    Calcium, Ion 1.08 (*)    HCT 27.0 (*)    Hemoglobin 9.2 (*)    All other components within normal limits  POCT I-STAT 7, (LYTES, BLD GAS, ICA,H+H) - Abnormal; Notable for the following components:   pH, Arterial 7.490 (*)    pO2, Arterial 156 (*)    Sodium 133 (*)    Calcium, Ion 1.12 (*)    HCT 25.0 (*)    Hemoglobin 8.5 (*)    All other components within normal limits  CULTURE, BLOOD (ROUTINE X 2)  CULTURE, BLOOD (ROUTINE X 2)  SARS CORONAVIRUS 2 BY RT PCR (HOSPITAL ORDER, [place])  MRSA PCR SCREENING  APTT  ETHANOL  MAGNESIUM  PHOSPHORUS  CK  HIV  ANTIBODY (ROUTINE TESTING W REFLEX)  TRIGLYCERIDES  LACTIC ACID, PLASMA  BLOOD GAS, ARTERIAL  CBC  BASIC METABOLIC PANEL  CBG MONITORING, ED  TYPE AND SCREEN  ABO/RH  PREPARE RBC (CROSSMATCH)    EKG EKG Interpretation  Date/Time:  Friday May 23 2020 15:28:17 EDT Ventricular Rate:  144 PR Interval:    QRS Duration: 87 QT Interval:  319 QTC Calculation: 494 R Axis:   43 Text Interpretation: Sinus tachycardia Ventricular premature complex Aberrant complex Abnormal R-wave progression, early transition Borderline T abnormalities, inferior leads Borderline prolonged QT interval No significant  change since last tracing Confirmed by Gwyneth Sprout ([id number]) on 05/23/2020 4:07:52 PM   Radiology DG Chest 1 View  Result Date: 05/23/2020 CLINICAL DATA:  Pain following fall EXAM: CHEST  1 VIEW COMPARISON:  August 18, 2018 FINDINGS: The lungs are clear. The heart size and pulmonary vascularity are normal. No adenopathy. No pneumothorax. No bone lesions. IMPRESSION: Lungs clear.  Cardiac silhouette normal. Electronically Signed   By: Bretta Bang III M.D.   On: 05/23/2020 16:40   CT Head Wo Contrast  Result Date: 05/23/2020 CLINICAL DATA:  Neck pain.  Disorientation and confusion. EXAM: CT HEAD WITHOUT CONTRAST CT CERVICAL SPINE WITHOUT CONTRAST TECHNIQUE: Multidetector CT imaging of the head and cervical spine was performed following the standard protocol without intravenous contrast. Multiplanar CT image reconstructions of the cervical spine were also generated. COMPARISON:  05/21/2018 FINDINGS: CT HEAD FINDINGS Brain: No evidence of acute infarction, hemorrhage, hydrocephalus, extra-axial collection or mass lesion/mass effect. Atrophy as before. Vascular: No hyperdense vessel or unexpected calcification. Skull: Normal. Negative for fracture or focal lesion. Sinuses/Orbits: Opacification of RIGHT maxillary and paranasal sinuses in this intubated patient. CT CERVICAL SPINE FINDINGS  Alignment: Normal. Skull base and vertebrae: No acute fracture. No primary bone lesion or focal pathologic process. Soft tissues and spinal canal: No prevertebral fluid or swelling. No visible canal hematoma. Disc levels: Multilevel spinal degenerative change greatest at C4-5 and C5-6 with disc space narrowing and osteophyte formation at these levels, worse than on the previous imaging study Upper chest: Negative. Other: Opacification of sinuses in this intubated patient. IMPRESSION: 1. No acute intracranial pathology. 2. No acute fracture or subluxation of the cervical spine. 3. Multilevel spinal degenerative change greatest at C4-5 and C5-6 with disc space narrowing and osteophyte formation at these levels, worse than on the previous imaging study. Electronically Signed   By: Donzetta Kohut M.D.   On: 05/23/2020 20:26   CT Cervical Spine Wo Contrast  Result Date: 05/23/2020 CLINICAL DATA:  Neck pain.  Disorientation and confusion. EXAM: CT HEAD WITHOUT CONTRAST CT CERVICAL SPINE WITHOUT CONTRAST TECHNIQUE: Multidetector CT imaging of the head and cervical spine was performed following the standard protocol without intravenous contrast. Multiplanar CT image reconstructions of the cervical spine were also generated. COMPARISON:  05/21/2018 FINDINGS: CT HEAD FINDINGS Brain: No evidence of acute infarction, hemorrhage, hydrocephalus, extra-axial collection or mass lesion/mass effect. Atrophy as before. Vascular: No hyperdense vessel or unexpected calcification. Skull: Normal. Negative for fracture or focal lesion. Sinuses/Orbits: Opacification of RIGHT maxillary and paranasal sinuses in this intubated patient. CT CERVICAL SPINE FINDINGS Alignment: Normal. Skull base and vertebrae: No acute fracture. No primary bone lesion or focal pathologic process. Soft tissues and spinal canal: No prevertebral fluid or swelling. No visible canal hematoma. Disc levels: Multilevel spinal degenerative change greatest at C4-5 and  C5-6 with disc space narrowing and osteophyte formation at these levels, worse than on the previous imaging study Upper chest: Negative. Other: Opacification of sinuses in this intubated patient. IMPRESSION: 1. No acute intracranial pathology. 2. No acute fracture or subluxation of the cervical spine. 3. Multilevel spinal degenerative change greatest at C4-5 and C5-6 with disc space narrowing and osteophyte formation at these levels, worse than on the previous imaging study. Electronically Signed   By: Donzetta Kohut M.D.   On: 05/23/2020 20:26   DG Chest Port 1 View  Result Date: 05/24/2020 CLINICAL DATA:  Hypoxia EXAM: PORTABLE CHEST 1 VIEW COMPARISON:  May 23, 2020 FINDINGS: Endotracheal tube tip is 6.2 cm above  the carina. Nasogastric tube tip and side port are in the stomach. No pneumothorax. The lungs are clear. Heart is upper normal in size with pulmonary vascularity normal. No adenopathy. No bone lesions. IMPRESSION: Tube positions as described without pneumothorax. Lungs clear. Stable cardiac silhouette. Electronically Signed   By: Lowella Grip III M.D.   On: 05/24/2020 09:46   DG Abd Portable 1V  Result Date: 05/24/2020 CLINICAL DATA:  Orogastric tube placement EXAM: PORTABLE ABDOMEN - 1 VIEW COMPARISON:  None. FINDINGS: Orogastric tube tip and side port are in the stomach with the side port just distal to the gastroesophageal junction. Visualized bowel gas is unremarkable without bowel dilatation or air-fluid level to suggest bowel obstruction. No free air. Lung bases clear. IMPRESSION: Orogastric tube tip and side port in stomach with side port immediately distal to the gastroesophageal junction. Visualized bowel gas pattern unremarkable. No evident bowel obstruction or free air. Lung bases clear. Electronically Signed   By: Lowella Grip III M.D.   On: 05/24/2020 09:45   EEG adult  Result Date: 05/23/2020 Lora Havens, MD     05/23/2020 10:12 PM Patient Name: [name]  MRN: [id number] Epilepsy Attending: Lora Havens Referring Physician/Provider: Dr Roland Rack Date: 05/23/2020 Duration: 24.20 mins Patient history: 39 year old male with focal seizures with recurrent seizures without return to baseline, consistent with status epilepticus.  EEG to evaluate for seizure Level of alertness: Lethargic AEDs during EEG study: Versed, keppra, Propofol Technical aspects: This EEG study was done with scalp electrodes positioned according to the 10-20 International system of electrode placement. Electrical activity was acquired at a sampling rate of 500Hz  and reviewed with a high frequency filter of 70Hz  and a low frequency filter of 1Hz . EEG data were recorded continuously and digitally stored. Description: EEG showed continuous generalized 3 to 6 Hz theta-delta slowing admixed with 15 to 18 Hz beta activity with irregular morphology distributed symmetrically and diffusely.  Hyperventilation and photic stimulation were not performed.   ABNORMALITY -Excessive beta, generalized -Continuous slow, generalized IMPRESSION: This study is suggestive of severe diffuse encephalopathy, nonspecific etiology but likely related to sedation. The excessive beta activity seen in the background is most likely due to the effect of benzodiazepine and is a benign EEG pattern. No seizures or epileptiform discharges were seen throughout the recording. Priyanka Barbra Sarks   Overnight EEG with video  Result Date: 05/24/2020 Lora Havens, MD     05/24/2020 10:21 AM Patient Name: [name] MRN: [id number] Epilepsy Attending: Lora Havens Referring Physician/Provider: Dr Roland Rack Duration: 05/23/2020 2210 to 05/23/2020 1015  Patient history: 39 year old male with focal seizures with recurrent seizures without return to baseline, consistent with status epilepticus. EEG to evaluate for seizure  Level of alertness: Lethargic  AEDs during EEG study: Versed, keppra, Propofol  Technical  aspects: This EEG study was done with scalp electrodes positioned according to the 10-20 International system of electrode placement. Electrical activity was acquired at a sampling rate of 500Hz  and reviewed with a high frequency filter of 70Hz  and a low frequency filter of 1Hz . EEG data were recorded continuously and digitally stored.  Description: EEG showed continuous generalized 3 to 6 Hz theta-delta slowing admixed with 15 to 18 Hz beta activity with irregular morphology distributed symmetrically and diffusely.  Hyperventilation and photic stimulation were not performed.    ABNORMALITY -Excessive beta, generalized -Continuous slow, generalized  IMPRESSION: This study is suggestive of severe diffuse encephalopathy, nonspecific etiology but likely related to sedation. The excessive  beta activity seen in the background is most likely due to the effect of benzodiazepine and is a benign EEG pattern. No seizures or epileptiform discharges were seen throughout the recording.  [place]   DG HIP UNILAT WITH PELVIS 2-3 VIEWS RIGHT  Result Date: 05/23/2020 CLINICAL DATA:  Pain following fall EXAM: DG HIP (WITH OR WITHOUT PELVIS) 2-3V RIGHT COMPARISON:  None. FINDINGS: Frontal pelvis as well as frontal and lateral right hip images were obtained. There is a comminuted intertrochanteric femur fracture on the left with varus angulation at the fracture site and avulsion of the lesser trochanter. On the right, there is no appreciable fracture or dislocation. There is mild narrowing of each hip joint. No erosive change. Calcification noted adjacent to the greater trochanter on the right likely represents calcific tendinosis. IMPRESSION: Comminuted intertrochanteric femur fracture on the left. On the right, no fracture or dislocation evident. Mild symmetric narrowing of each hip joint. Electronically Signed   By: Lowella Grip III M.D.   On: 05/23/2020 16:42   DG Femur Min 2 Views Left  Result Date:  05/23/2020 CLINICAL DATA:  Pain following fall EXAM: LEFT FEMUR 2 VIEWS COMPARISON:  March 27, 2013 FINDINGS: Frontal and lateral views were obtained. There is a comminuted intertrochanteric femur fracture on the left with varus angulation at the fracture site. There is avulsion of the lesser trochanter. No other fractures are evident. The patient is status post below the knee amputation. There is a degree of underlying osteoporosis. IMPRESSION: Comminuted fracture, intertrochanteric region of proximal femur with varus angulation at fracture site. No more distal fracture evident. No dislocation. Status post below the knee amputation. Osteoporosis. Electronically Signed   By: Lowella Grip III M.D.   On: 05/23/2020 16:40    Procedures .Critical Care Performed by: Tedd Sias, PA Authorized by: Tedd Sias, PA   Critical care provider statement:    Critical care time (minutes):  65   Critical care time was exclusive of:  Separately billable procedures and treating other patients and teaching time   Critical care was necessary to treat or prevent imminent or life-threatening deterioration of the following conditions:  Sepsis, endocrine crisis, metabolic crisis and dehydration   Critical care was time spent personally by me on the following activities:  Discussions with consultants, evaluation of patient's response to treatment, examination of patient, review of old charts, re-evaluation of patient's condition, pulse oximetry, ordering and review of radiographic studies, ordering and review of laboratory studies and ordering and performing treatments and interventions   I assumed direction of critical care for this patient from another provider in my specialty: no     (including critical care time)  Medications Ordered in ED Medications  midazolam (VERSED) 2 MG/2ML injection (has no administration in time range)  fentaNYL (SUBLIMAZE) 100 MCG/2ML injection (has no administration in time  range)  fentaNYL (SUBLIMAZE) injection 50-200 mcg (50 mcg Intravenous Given 05/24/20 1036)  midazolam (VERSED) injection 2 mg (2 mg Intravenous Given 05/24/20 2218)  polyethylene glycol (MIRALAX / GLYCOLAX) packet 17 g (has no administration in time range)  propofol (DIPRIVAN) 1000 MG/100ML infusion (45 mcg/kg/min  70 kg Intravenous New Bag/Given 05/25/20 0027)  chlorhexidine gluconate (MEDLINE KIT) (PERIDEX) 0.12 % solution 15 mL (15 mLs Mouth Rinse Given 05/24/20 2001)  MEDLINE mouth rinse (15 mLs Mouth Rinse Given 05/25/20 0006)  Chlorhexidine Gluconate Cloth 2 % PADS 6 each (6 each Topical Given 05/24/20 2219)  vancomycin (VANCOCIN) IVPB 1000 mg/200 mL premix (1,000  mg Intravenous New Bag/Given 05/24/20 2023)  0.9 %  sodium chloride infusion (Manually program via Guardrails IV Fluids) (has no administration in time range)  dextrose 5 %-0.9 % sodium chloride infusion ( Intravenous Stopped 05/24/20 1724)  pantoprazole (PROTONIX) injection 40 mg (40 mg Intravenous Given 05/24/20 2206)  docusate (COLACE) 50 MG/5ML liquid 100 mg (has no administration in time range)  levETIRAcetam (KEPPRA) 750 mg in sodium chloride 0.9 % 100 mL IVPB (has no administration in time range)  lactated ringers bolus 2,000 mL (2,000 mLs Intravenous New Bag/Given 05/23/20 1553)  lactated ringers bolus 1,000 mL (1,000 mLs Intravenous New Bag/Given 05/23/20 2024)  LORazepam (ATIVAN) injection 1 mg (2 mg Intravenous Given 05/23/20 1715)  vancomycin (VANCOREADY) IVPB 1500 mg/300 mL (1,500 mg Intravenous New Bag/Given 05/23/20 2006)  LORazepam (ATIVAN) injection 2 mg (2 mg Intravenous Given 05/23/20 1825)  midazolam (VERSED) injection 2 mg (2 mg Intravenous Given 05/24/20 0126)  levETIRAcetam (KEPPRA) 4,000 mg in sodium chloride 0.9 % 250 mL IVPB (4,000 mg Intravenous New Bag/Given 05/23/20 2156)    ED Course  I have reviewed the triage vital signs and the nursing notes.  Pertinent labs & imaging results that were available during my  care of the patient were reviewed by me and considered in my medical decision making (see chart for details).  This 39 year old male presented today for confusion disorientation with multiple seizures yesterday.  Patient has no other localizing symptoms however is noted to be febrile tachycardic and tachypneic and has a SPO2 obtained of 83% was placed on oxygen immediately.  Code sepsis initiated by myself upon evaluating patient as he is febrile and tachycardic and ill-appearing.  Sepsis work-up initiated with blood cultures, urine culture and empiric antibiotics.  Pharmacy was consulted to discuss antibiotic coverage.  Given his altered mental status and worsening mental status over the course the ED visit he was covered for meningitis empirically.  The differential diagnosis for AMS is extensive and includes, but is not limited to: drug overdose - opioids, alcohol, sedatives, antipsychotics, drug withdrawal, others; Metabolic: hypoxia, hypoglycemia, hyperglycemia, hypercalcemia, hypernatremia, hyponatremia, uremia, hepatic encephalopathy, hypothyroidism, hyperthyroidism, vitamin B12 or thiamine deficiency, carbon monoxide poisoning, Wilson's disease, Lactic acidosis, DKA/HHOS; Infectious: meningitis, encephalitis, bacteremia/sepsis, urinary tract infection, pneumonia, neurosyphilis; Structural: Space-occupying lesion, (brain tumor, subdural hematoma, hydrocephalus,); Vascular: stroke, subarachnoid hemorrhage, coronary ischemia, hypertensive encephalopathy, CNS vasculitis, thrombotic thrombocytopenic purpura, disseminated intravascular coagulation, hyperviscosity; Psychiatric: Schizophrenia, depression; Other: Seizure, hypothermia, heat stroke, dementia  Given this patient's history physical exam and presentation today suspicious for drug use, alcohol withdrawal or alcohol intoxication, metabolic encephalopathy including uremia, hepatic encephalopathy, lactic acidosis, DKA, HHS, meningitis is less likely  in this situation however he is febrile.  I discussed this case with my attending physician who cosigned this note including patient's presenting symptoms, physical exam, and planned diagnostics and interventions. Attending physician stated agreement with plan or made changes to plan which were implemented.   Attending physician assessed patient at bedside.  Patient is now actively hallucinating.  He has become more tremulous and more tachycardic during his ED visit and has actually pulled out both IVs.  Alcohol withdrawal was considered more likely given the symptoms.  Benzodiazepine medications will not be administered.  Magnesium within normal limits, phosphorus within normal.  CBC with significant leukocytosis and moderate anemia.  No recent comparison he may be bleeding partially from his fractured left femur.  There is no significant hematoma on my exam that is visible from the surface. CMP with evidence of dehydration  with some elevated creatinine he does have anion gap likely secondary to elevated lactic which may be secondary to infection versus numerous seizures.  Versus dehydration/anemia.  Acidosis at 8.3 trending upwards to 9.5.  2 view ammonia very mildly elevated but not significantly so.  Rapid drug screen positive for amphetamines negative for others, ethanol negative, salicylate and Tylenol levels within normal limits.  Urinalysis with no significant signs of infection with only rare bacteria.  There is bilirubin, ketones protein and glucose present.  Patient is clearly stated physiologic stress.  Will obtain culture of urine to see if there is any evidence of UTI/pyelonephritis.  Chest x-ray is unremarkable.  Femur is fractured on plain film.  CT head and neck without any acute abnormality.  Patient continues to decompensate.  Dr. Maryan Rued my attending physician discussed case with critical care who chose to intubate the patient.  He is admitted at this time for further evaluation and  care.   MDM Rules/Calculators/A&P                          Final Clinical Impression(s) / ED Diagnoses Final diagnoses:  SOB (shortness of breath)  Fall  Encounter for orogastric (OG) tube placement  Respiratory failure [facility])    Rx / DC Orders ED Discharge Orders    None       Tedd Sias, [place] 05/25/20 1694    Blanchie Dessert, MD 05/25/20 2332

## 2020-05-23 NOTE — Procedures (Signed)
Intubation Procedure Note Masato Pettie [id number] 07/14/1981  Procedure: Intubation Indications: Airway protection and maintenance  Procedure Details Consent: Risks of procedure as well as the alternatives and risks of each were explained to the (patient/caregiver).  Consent for procedure obtained. Time Out: Verified patient identification, verified procedure, site/side was marked, verified correct patient position, special equipment/implants available, medications/allergies/relevent history reviewed, required imaging and test results available.  Performed  Drugs Etomidate 20mg  fentanyl 60mcg versed 2mg  rocuronium 70mg  DL x 1 with GS 3 blade Grade 1 view 7.5 ET tube passed through cords under direct visualization Placement confirmed with bilateral breath sounds, positive EtCO2 change and smoke in tube   Evaluation Hemodynamic Status: BP stable throughout; O2 sats: stable throughout Patient's Current Condition: stable Complications: No apparent complications Patient did tolerate procedure well. Chest X-ray ordered to verify placement.  CXR: pending.   Roselie Awkward 05/23/2020

## 2020-05-23 NOTE — Progress Notes (Signed)
Patient transported to CT and to 4N16 on the ventilator from the ER with no complication.

## 2020-05-23 NOTE — Progress Notes (Addendum)
  Pharmacy Antibiotic Note  [name] is a 39 y.o. male admitted on 05/23/2020 with sepsis.  Pharmacy has been consulted for vancomycin dosing.  Patient presenting to ED with multiple seizures. Meets sepsis criteria with temp of 100.4, HR 146, RR 34. Creatinine 1.49 but likely elevated from baseline due to his report of multiple seizures. No recent renal function to compare to current but please note patient is a L below the knee amputee. Patient too agitated to obtain a weight. Appears to be less than last recorded weight of 104 kg and appears closer to 75-85 kgs. Given deteriorating clinical status will dose vancomycin with a weight of 80 kg and will follow up once patient is stabilized.   **addendum 2116** Patient now has a proper weight recording to allow for more accurate dosing (79.3 kg). eCrCl ~66 ml/min. Vancomycin 1000 mg IV Q12h Cefepime 2 grams IV Q8h  Plan: Vancomycin 1500 mg IV loading dose/first dose  Cefepime 2 grams once   Follow up obtaining patient weight Follow up cultures, clinical status, renal function, de-escalation    Temp (24hrs), Avg:100.4 F (38 C), Min:100.4 F (38 C), Max:100.4 F (38 C)  Recent Labs  Lab 05/23/20 1513  CREATININE 1.49*  LATICACIDVEN 8.3*    CrCl cannot be calculated (Unknown ideal weight.).    No Known Allergies  Antimicrobials this admission: Cefepime 6/11 >>  vanco 6/11 >>   Dose adjustments this admission: N/A  Microbiology results: 6/11 BCx: sent 6/11 UCx: (in/out cath) sent    Thank you for allowing pharmacy to be a part of this patient's care.   Eddie Candle, PharmD PGY-1 Pharmacy Resident   Please check amion for clinical pharmacist contact number 05/23/2020 4:22 PM

## 2020-05-23 NOTE — Progress Notes (Signed)
[place] Progress Note Patient Name: Wendy Mikles DOB: 1981/05/11 MRN: [id number]   Date of Service  05/23/2020  HPI/Events of Note  Pt admitted with ETOH withdrawal seizures, altered mental status, intertrochanteric left hip fracture.  eICU Interventions  New Patient Evaluation completed.        Kerry Kass Parrish Bonn 05/23/2020, 11:11 PM

## 2020-05-23 NOTE — ED Triage Notes (Addendum)
Pt here with gcems with co disorientation and confusion. Fall leaving his bathroom at home yesterday (pt states last week). Left hip pain noted. Hx of 6 seizures yesterday... does not take seizure medication. Family states he has been confused. Pt has a Left BKA. 18g left ac. EMS gave 534ml fluid. Tachycardic. Alcohol abuse.

## 2020-05-24 ENCOUNTER — Inpatient Hospital Stay (HOSPITAL_COMMUNITY): Payer: Medicaid Other

## 2020-05-24 DIAGNOSIS — J988 Other specified respiratory disorders: Secondary | ICD-10-CM

## 2020-05-24 LAB — POCT I-STAT 7, (LYTES, BLD GAS, ICA,H+H)
Acid-Base Excess: 2 mmol/L (ref 0.0–2.0)
Bicarbonate: 25 mmol/L (ref 20.0–28.0)
Calcium, Ion: 1.12 mmol/L — ABNORMAL LOW (ref 1.15–1.40)
HCT: 25 % — ABNORMAL LOW (ref 39.0–52.0)
Hemoglobin: 8.5 g/dL — ABNORMAL LOW (ref 13.0–17.0)
O2 Saturation: 100 %
Patient temperature: 98.3
Potassium: 3.8 mmol/L (ref 3.5–5.1)
Sodium: 133 mmol/L — ABNORMAL LOW (ref 135–145)
TCO2: 26 mmol/L (ref 22–32)
pCO2 arterial: 32.7 mmHg (ref 32.0–48.0)
pH, Arterial: 7.49 — ABNORMAL HIGH (ref 7.350–7.450)
pO2, Arterial: 156 mmHg — ABNORMAL HIGH (ref 83.0–108.0)

## 2020-05-24 LAB — CBC
HCT: 22.9 % — ABNORMAL LOW (ref 39.0–52.0)
HCT: 25.1 % — ABNORMAL LOW (ref 39.0–52.0)
Hemoglobin: 6.8 g/dL — CL (ref 13.0–17.0)
Hemoglobin: 7.8 g/dL — ABNORMAL LOW (ref 13.0–17.0)
MCH: 26.2 pg (ref 26.0–34.0)
MCH: 26.6 pg (ref 26.0–34.0)
MCHC: 29.7 g/dL — ABNORMAL LOW (ref 30.0–36.0)
MCHC: 31.1 g/dL (ref 30.0–36.0)
MCV: 88.1 fL (ref 80.0–100.0)
MCV: 85.7 fL (ref 80.0–100.0)
Platelets: DECREASED 10*3/uL (ref 150–400)
Platelets: 86 10*3/uL — ABNORMAL LOW (ref 150–400)
RBC: 2.6 MIL/uL — ABNORMAL LOW (ref 4.22–5.81)
RBC: 2.93 MIL/uL — ABNORMAL LOW (ref 4.22–5.81)
RDW: 20.3 % — ABNORMAL HIGH (ref 11.5–15.5)
RDW: 19.1 % — ABNORMAL HIGH (ref 11.5–15.5)
WBC: 11.8 10*3/uL — ABNORMAL HIGH (ref 4.0–10.5)
WBC: 12.2 10*3/uL — ABNORMAL HIGH (ref 4.0–10.5)
nRBC: 0.3 % — ABNORMAL HIGH (ref 0.0–0.2)
nRBC: 0.7 % — ABNORMAL HIGH (ref 0.0–0.2)

## 2020-05-24 LAB — GLUCOSE, CAPILLARY
Glucose-Capillary: 114 mg/dL — ABNORMAL HIGH (ref 70–99)
Glucose-Capillary: 108 mg/dL — ABNORMAL HIGH (ref 70–99)
Glucose-Capillary: 119 mg/dL — ABNORMAL HIGH (ref 70–99)
Glucose-Capillary: 121 mg/dL — ABNORMAL HIGH (ref 70–99)

## 2020-05-24 LAB — BLOOD CULTURE ID PANEL (REFLEXED)

## 2020-05-24 LAB — BASIC METABOLIC PANEL
Anion gap: 12 (ref 5–15)
BUN: 15 mg/dL (ref 6–20)
CO2: 21 mmol/L — ABNORMAL LOW (ref 22–32)
Calcium: 7.8 mg/dL — ABNORMAL LOW (ref 8.9–10.3)
Chloride: 98 mmol/L (ref 98–111)
Creatinine, Ser: 1.31 mg/dL — ABNORMAL HIGH (ref 0.61–1.24)
GFR calc Af Amer: >60 mL/min (ref >60)
GFR calc non Af Amer: >60 mL/min (ref >60)
Glucose, Bld: 99 mg/dL (ref 70–99)
Potassium: 3.9 mmol/L (ref 3.5–5.1)
Sodium: 131 mmol/L — ABNORMAL LOW (ref 135–145)

## 2020-05-24 LAB — TRIGLYCERIDES: Triglycerides: 137 mg/dL (ref <150)

## 2020-05-24 LAB — CK: Total CK: 373 U/L (ref 49–397)

## 2020-05-24 LAB — MAGNESIUM: Magnesium: 1.9 mg/dL (ref 1.7–2.4)

## 2020-05-24 LAB — PHOSPHORUS: Phosphorus: 2.8 mg/dL (ref 2.5–4.6)

## 2020-05-24 LAB — HIV ANTIBODY (ROUTINE TESTING W REFLEX): HIV Screen 4th Generation wRfx: NONREACTIVE

## 2020-05-24 LAB — LACTIC ACID, PLASMA: Lactic Acid, Venous: 1.8 mmol/L (ref 0.5–1.9)

## 2020-05-24 LAB — ABO/RH: ABO/RH(D): O POS

## 2020-05-24 MED ORDER — DOCUSATE SODIUM 50 MG/5ML PO LIQD
100.0000 mg | Freq: Two times a day (BID) | ORAL | Status: DC
  Administered 2020-05-24: 100 mg

## 2020-05-24 MED ORDER — DEXTROSE-NACL 5-0.9 % IV SOLN: INTRAVENOUS | Status: DC

## 2020-05-24 MED ORDER — PANTOPRAZOLE SODIUM 40 MG IV SOLR
40.0000 mg | Freq: Two times a day (BID) | INTRAVENOUS | Status: DC
  Administered 2020-05-24 – 2020-05-31 (×15): 40 mg via INTRAVENOUS
  Filled 2020-05-24 (×15): qty 40

## 2020-05-24 MED ORDER — SODIUM CHLORIDE 0.9% IV SOLUTION: Freq: Once | INTRAVENOUS | Status: DC

## 2020-05-24 MED ORDER — DOCUSATE SODIUM 50 MG/5ML PO LIQD: 100.0000 mg | Freq: Two times a day (BID) | ORAL | Status: DC | PRN

## 2020-05-24 MED ORDER — POLYETHYLENE GLYCOL 3350 17 G PO PACK
17.0000 g | PACK | Freq: Every day | ORAL | Status: DC
  Administered 2020-05-24: 17 g

## 2020-05-24 MED ORDER — SODIUM CHLORIDE 0.9 % IV SOLN
750.0000 mg | Freq: Two times a day (BID) | INTRAVENOUS | Status: DC
  Administered 2020-05-25 – 2020-06-07 (×27): 750 mg via INTRAVENOUS
  Filled 2020-05-24 (×31): qty 7.5

## 2020-05-24 NOTE — Procedures (Addendum)
Patient Name: [name]  MRN: [id number]  Epilepsy Attending: Lora Havens  Referring Physician/Provider: Dr Roland Rack Duration: 05/23/2020 2210 to 05/24/2020 2210  Patient history: 39 year old male with focal seizures with recurrent seizures without return to baseline, consistent with status epilepticus. EEG to evaluate for seizure  Level of alertness: Lethargic  AEDs during EEG study: Versed, keppra, Propofol  Technical aspects: This EEG study was done with scalp electrodes positioned according to the 10-20 International system of electrode placement. Electrical activity was acquired at a sampling rate of 500Hz  and reviewed with a high frequency filter of 70Hz  and a low frequency filter of 1Hz . EEG data were recorded continuously and digitally stored.   Description: EEG showed continuous generalized 3 to 6 Hz theta-delta slowing admixed with 15 to 18 Hz beta activity with irregular morphology distributed symmetrically and diffusely.  Hyperventilation and photic stimulation were not performed.     ABNORMALITY -Excessive beta, generalized -Continuous slow, generalized  IMPRESSION: This study is suggestive of severe diffuse encephalopathy, nonspecific etiology but likely related to sedation. The excessive beta activity seen in the background is most likely due to the effect of benzodiazepine and is a benign EEG pattern. No seizures or epileptiform discharges were seen throughout the recording.  Lenay Lovejoy Barbra Sarks

## 2020-05-24 NOTE — H&P (View-Only) (Signed)
Orthopaedic Trauma Service (OTS) Consult   Patient ID: [name] MRN: [id number] DOB/AGE: 41-08-1981 41 y.o.  Reason for Consult: Left hip fracture Referring Physician: Blanchie Dessert, MD [facility]  HPI: [name] is an 39 y.o. male who is being seen in consultation at the request of Dr. Maryan Rued for evaluation of left hip fracture.  Patient has a history of alcohol abuse and seizures who presented yesterday after 3 days without drinking and 2 days of seizure history.  He had a altered mental status and was concerned about alcohol withdrawal.  He was found to have a left hip fracture and I was called.  Patient was subsequently intubated in the emergency room yesterday evening.  He was admitted to the ICU.  Critical care is managing him overnight.  Patient is currently on continuous EEG with neurology consulting for his seizures.  Patient was seen and evaluated this morning on 4 N.  He is intubated does not follow commands due to sedation.  He was on continuous EEG.  He has a history of a left BKA below the fractured hip.  No family is at bedside.  He does have a prosthesis in the room with him.  Past Medical History:  Diagnosis Date  . Alcohol abuse   . Seizures ([place])   . Stroke [facility])     Past Surgical History:  Procedure Laterality Date  . Above knee amputation of left leg    . BELOW KNEE LEG AMPUTATION     LEFT  . below the knee amputation      Family History  Problem Relation Age of Onset  . Heart attack Mother        Negative Hx    Social History:  reports that he has been smoking cigarettes. He has a 6.60 pack-year smoking history. He has never used smokeless tobacco. He reports current alcohol use of about 2.0 standard drinks of alcohol per week. He reports that he does not use drugs.  Allergies: No Known Allergies  Medications:  No current facility-administered medications on file prior to encounter.   Current Outpatient Medications on File Prior to  Encounter  Medication Sig Dispense Refill  . ibuprofen (ADVIL,MOTRIN) 600 MG tablet Take 1 tablet (600 mg total) by mouth every 8 (eight) hours as needed for moderate pain. (Patient not taking: Reported on 08/18/2018) 21 tablet 0  . levETIRAcetam (KEPPRA) 500 MG tablet Take 1 tablet (500 mg total) by mouth 2 (two) times daily. 60 tablet 1    ROS: Unable to obtain due to patient's mental status.  Exam: Blood pressure (!) 83/65, pulse (!) 110, temperature 99.4 F (37.4 C), temperature source Oral, resp. rate 18, height 5\' 8"  (1.727 m), weight 79.3 kg, SpO2 100 %. General: Intubated and sedated Orientation: Unable to assess Mood and Affect: Unable to assess due to intubation and sedation Gait: Unable to assess Coordination and balance: Unable to assess   Left lower extremity: Skin without lesions.  Notable swelling to the proximal hip.  He does withdrawal to manipulation of the leg.  His stump to his left lower extremity is without any problems.  Unable to perform neurovascular exam secondary to amputation status.  Knee without any obvious swelling or deformity.  Right lower extremity: Skin without lesions.  Does not withdrawal to pain or manipulation.  Unable to cooperate with neuro exam   Medical Decision Making: Data: Imaging: X-rays of the pelvis and left hip show a comminuted intertrochanteric femur fracture with notable varus and  shortening.  Labs:  Results for orders placed or performed during the hospital encounter of 05/23/20 (from the past 24 hour(s))  Lactic acid, plasma     Status: Abnormal   Collection Time: 05/23/20  3:13 PM  Result Value Ref Range   Lactic Acid, Venous 8.3 (HH) 0.5 - 1.9 mmol/L  Comprehensive metabolic panel     Status: Abnormal   Collection Time: 05/23/20  3:13 PM  Result Value Ref Range   Sodium 131 (L) 135 - 145 mmol/L   Potassium 4.5 3.5 - 5.1 mmol/L   Chloride 97 (L) 98 - 111 mmol/L   CO2 14 (L) 22 - 32 mmol/L   Glucose, Bld 94 70 - 99 mg/dL    BUN 8 6 - 20 mg/dL   Creatinine, Ser 1.49 (H) 0.61 - 1.24 mg/dL   Calcium 8.5 (L) 8.9 - 10.3 mg/dL   Total Protein 7.0 6.5 - 8.1 g/dL   Albumin 2.6 (L) 3.5 - 5.0 g/dL   AST 141 (H) 15 - 41 U/L   ALT 34 0 - 44 U/L   Alkaline Phosphatase 178 (H) 38 - 126 U/L   Total Bilirubin 3.4 (H) 0.3 - 1.2 mg/dL   GFR calc non Af Amer 58 (L) >60 mL/min   GFR calc Af Amer >60 >60 mL/min   Anion gap 20 (H) 5 - 15  CBC WITH DIFFERENTIAL     Status: Abnormal   Collection Time: 05/23/20  3:13 PM  Result Value Ref Range   WBC 13.3 (H) 4.0 - 10.5 K/uL   RBC 3.19 (L) 4.22 - 5.81 MIL/uL   Hemoglobin 8.3 (L) 13.0 - 17.0 g/dL   HCT 28.3 (L) 39 - 52 %   MCV 88.7 80.0 - 100.0 fL   MCH 26.0 26.0 - 34.0 pg   MCHC 29.3 (L) 30.0 - 36.0 g/dL   RDW 21.0 (H) 11.5 - 15.5 %   Platelets PLATELET CLUMPS NOTED ON SMEAR, UNABLE TO ESTIMATE 150 - 400 K/uL   nRBC 1.1 (H) 0.0 - 0.2 %   Neutrophils Relative % 77 %   Neutro Abs 10.2 (H) 1.7 - 7.7 K/uL   Lymphocytes Relative 11 %   Lymphs Abs 1.5 0.7 - 4.0 K/uL   Monocytes Relative 11 %   Monocytes Absolute 1.5 (H) 0 - 1 K/uL   Eosinophils Relative 0 %   Eosinophils Absolute 0.0 0 - 0 K/uL   Basophils Relative 0 %   Basophils Absolute 0.0 0 - 0 K/uL   Immature Granulocytes 1 %   Abs Immature Granulocytes 0.13 (H) 0.00 - 0.07 K/uL  APTT     Status: None   Collection Time: 05/23/20  3:13 PM  Result Value Ref Range   aPTT 35 24 - 36 seconds  Protime-INR     Status: Abnormal   Collection Time: 05/23/20  3:13 PM  Result Value Ref Range   Prothrombin Time 16.5 (H) 11.4 - 15.2 seconds   INR 1.4 (H) 0.8 - 1.2  CK     Status: Abnormal   Collection Time: 05/23/20  3:13 PM  Result Value Ref Range   Total CK 405 (H) 49.0 - 397.0 U/L  Urinalysis, Routine w reflex microscopic     Status: Abnormal   Collection Time: 05/23/20  3:14 PM  Result Value Ref Range   Color, Urine AMBER (A) YELLOW   APPearance HAZY (A) CLEAR   Specific Gravity, Urine 1.027 1.005 - 1.030   pH 5.0  5.0 - 8.0  Glucose, UA 50 (A) NEGATIVE mg/dL   Hgb urine dipstick NEGATIVE NEGATIVE   Bilirubin Urine SMALL (A) NEGATIVE   Ketones, ur 20 (A) NEGATIVE mg/dL   Protein, ur 100 (A) NEGATIVE mg/dL   Nitrite NEGATIVE NEGATIVE   Leukocytes,Ua NEGATIVE NEGATIVE   RBC / HPF 0-5 0 - 5 RBC/hpf   WBC, UA 0-5 0 - 5 WBC/hpf   Bacteria, UA RARE (A) NONE SEEN   Squamous Epithelial / LPF 0-5 0 - 5   Mucus PRESENT    Hyaline Casts, UA PRESENT   Ammonia     Status: Abnormal   Collection Time: 05/23/20  3:14 PM  Result Value Ref Range   Ammonia 46 (H) 9 - 35 umol/L  Ethanol     Status: None   Collection Time: 05/23/20  3:14 PM  Result Value Ref Range   Alcohol, Ethyl (B) <10 <10 mg/dL  Rapid urine drug screen (hospital performed)     Status: Abnormal   Collection Time: 05/23/20  3:14 PM  Result Value Ref Range   Opiates NONE DETECTED NONE DETECTED   Cocaine NONE DETECTED NONE DETECTED   Benzodiazepines NONE DETECTED NONE DETECTED   Amphetamines POSITIVE (A) NONE DETECTED   Tetrahydrocannabinol NONE DETECTED NONE DETECTED   Barbiturates NONE DETECTED NONE DETECTED  Blood Culture (routine x 2)     Status: None (Preliminary result)   Collection Time: 05/23/20  4:55 PM   Specimen: BLOOD LEFT FOREARM  Result Value Ref Range   Specimen Description BLOOD LEFT FOREARM    Special Requests      BOTTLES DRAWN AEROBIC AND ANAEROBIC Blood Culture adequate volume   Culture      NO GROWTH < 24 HOURS Performed at [facility], [address], [place], [place] [address]    Report Status PENDING   Blood Culture (routine x 2)     Status: None (Preliminary result)   Collection Time: 05/23/20  5:10 PM   Specimen: BLOOD  Result Value Ref Range   Specimen Description BLOOD RIGHT ANTECUBITAL    Special Requests      BOTTLES DRAWN AEROBIC AND ANAEROBIC Blood Culture adequate volume   Culture      NO GROWTH < 24 HOURS Performed at [facility], [place] [address] [place], [place] [address]     Report Status PENDING   Lactic acid, plasma     Status: Abnormal   Collection Time: 05/23/20  5:36 PM  Result Value Ref Range   Lactic Acid, Venous 9.5 (HH) 0.5 - 1.9 mmol/L  Salicylate level     Status: Abnormal   Collection Time: 05/23/20  5:36 PM  Result Value Ref Range   Salicylate Lvl <7.3 (L) 7.0 - 30.0 mg/dL  Acetaminophen level     Status: Abnormal   Collection Time: 05/23/20  5:36 PM  Result Value Ref Range   Acetaminophen (Tylenol), Serum <10 (L) 10 - 30 ug/mL  POC CBG, ED     Status: Abnormal   Collection Time: 05/23/20  5:46 PM  Result Value Ref Range   Glucose-Capillary 39 (LL) 70 - 99 mg/dL   Comment 1 Notify RN   CBG monitoring, ED     Status: None   Collection Time: 05/23/20  5:49 PM  Result Value Ref Range   Glucose-Capillary 77 70 - 99 mg/dL  SARS Coronavirus 2 by RT PCR (hospital order, performed in [facility] hospital lab) Nasopharyngeal Nasopharyngeal Swab     Status: None  Collection Time: 05/23/20  7:15 PM   Specimen: Nasopharyngeal Swab  Result Value Ref Range   SARS Coronavirus 2 NEGATIVE NEGATIVE  I-STAT 7, (LYTES, BLD GAS, ICA, H+H)     Status: Abnormal   Collection Time: 05/23/20  8:09 PM  Result Value Ref Range   pH, Arterial 7.382 7.35 - 7.45   pCO2 arterial 39.6 32 - 48 mmHg   pO2, Arterial 354 (H) 83 - 108 mmHg   Bicarbonate 23.5 20.0 - 28.0 mmol/L   TCO2 25 22 - 32 mmol/L   O2 Saturation 100.0 %   Acid-base deficit 1.0 0.0 - 2.0 mmol/L   Sodium 130 (L) 135 - 145 mmol/L   Potassium 3.4 (L) 3.5 - 5.1 mmol/L   Calcium, Ion 1.08 (L) 1.15 - 1.40 mmol/L   HCT 27.0 (L) 39 - 52 %   Hemoglobin 9.2 (L) 13.0 - 17.0 g/dL   Patient temperature 99.4 F    Collection site Radial    Drawn by HIDE    Sample type ARTERIAL   MRSA PCR Screening     Status: None   Collection Time: 05/23/20  9:15 PM   Specimen: Nasal Mucosa; Nasopharyngeal  Result Value Ref Range   MRSA by PCR NEGATIVE NEGATIVE  I-STAT 7, (LYTES, BLD GAS, ICA, H+H)     Status: Abnormal    Collection Time: 05/24/20  3:49 AM  Result Value Ref Range   pH, Arterial 7.490 (H) 7.35 - 7.45   pCO2 arterial 32.7 32 - 48 mmHg   pO2, Arterial 156 (H) 83 - 108 mmHg   Bicarbonate 25.0 20.0 - 28.0 mmol/L   TCO2 26 22 - 32 mmol/L   O2 Saturation 100.0 %   Acid-Base Excess 2.0 0.0 - 2.0 mmol/L   Sodium 133 (L) 135 - 145 mmol/L   Potassium 3.8 3.5 - 5.1 mmol/L   Calcium, Ion 1.12 (L) 1.15 - 1.40 mmol/L   HCT 25.0 (L) 39 - 52 %   Hemoglobin 8.5 (L) 13.0 - 17.0 g/dL   Patient temperature 98.3 F    Collection site Radial    Drawn by HIDE    Sample type ARTERIAL      Imaging or Labs ordered: No new imaging  Medical history and chart was reviewed and case discussed with medical provider.  Assessment/Plan: 39 year old male with a left intertrochanteric femur fracture.  Patient also is in alcohol withdrawal with subsequent seizures.  Patient will require cephalomedullary nailing of his left intertrochanteric femur fracture.  At this time due to the other medical comorbidities and seizure activity, I will delay his surgery.  We will tentatively plan to place him on the surgery schedule for tomorrow morning.  I discussed the surgery over the phone with the patient's sister.  She agrees to proceed with surgery.  Patient should be n.p.o. after midnight.  Shona Needles, MD [facility] [phone] (office) [email or web address]

## 2020-05-24 NOTE — Progress Notes (Signed)
STAT type and screen ordered this morning at 1104.  RN attempted to call phlebotomy multiple times with no answer.  RN went to draw labs myself and phlebotomist was here at that time to attempt a draw again.  Lab now pending.  Will give blood as soon and type and screen is done and blood is ready.

## 2020-05-24 NOTE — Progress Notes (Signed)
Subjective: No further seizures, but on propofol.  Exam: Vitals:   05/24/20 1000 05/24/20 1120  BP: 93/77 90/71  Pulse: 96 97  Resp: 18 20  Temp:    SpO2: 100%    Gen: In bed, NAD Resp: non-labored breathing, no acute distress Abd: soft, nt  Neuro: MS: Opens eyes and follows commands. CN: Pupils reactive bilaterally, EOMI Motor: Moves all extremities spontaneously  Pertinent Labs: Creatinine 1.3  Impression: 39 year old male with a history of focal seizures who presented in status epilepticus secondary to medication noncompliance and amphetamine use.  He is improving and waking up and no seizures on EEG, I would continue monitoring while propofol is weaned and then can discontinue if no seizures by tomorrow.  Recommendations: 1) continue Keppra 750 twice daily 2) continue LTM EEG, then can discontinue if negative tomorrow  Roland Rack, MD [facility] [phone]  If 7pm- 7am, please page neurology on call as listed in [place].

## 2020-05-24 NOTE — Progress Notes (Signed)
Spoke with pt's girlfriend on the phone.. stated I was limited on what information I could give her. Pt's sister, who is NOK, is planning to come here today.. will set up password with her.

## 2020-05-24 NOTE — Progress Notes (Signed)
LTM maint complete - no skin breakdown under: BN1,WH8,N1

## 2020-05-24 NOTE — Progress Notes (Signed)
CRITICAL VALUE ALERT  Critical Value:  6.8 hgb  Date & Time Notied: 05/24/20 0845  Provider Notified: Eliseo Gum, NP CCM  Orders Received/Actions taken: Orders pending

## 2020-05-24 NOTE — Progress Notes (Addendum)
PHARMACY - PHYSICIAN COMMUNICATION CRITICAL VALUE ALERT - BLOOD CULTURE IDENTIFICATION (BCID)  [name] is an 39 y.o. male who presented to [facility] on 05/23/2020  Assessment:  44 yom presenting with seizures, hip fracture, started on cefepime and vancomycin for sepsis of unknown source. BCx growing 3/4 staph species with methicillin resistance.  Name of physician (or Provider) Contacted: Sood, V  Current antibiotics: vancomycin / cefepime  Changes to prescribed antibiotics recommended: continue vancomycin, d/c cefepime  Results for orders placed or performed during the hospital encounter of 05/23/20  Blood Culture ID Panel (Reflexed) (Collected: 05/23/2020  4:55 PM)  Result Value Ref Range   Enterococcus species NOT DETECTED NOT DETECTED   Listeria monocytogenes NOT DETECTED NOT DETECTED   Staphylococcus species DETECTED (A) NOT DETECTED   Staphylococcus aureus (BCID) NOT DETECTED NOT DETECTED   Methicillin resistance DETECTED (A) NOT DETECTED   Streptococcus species NOT DETECTED NOT DETECTED   Streptococcus agalactiae NOT DETECTED NOT DETECTED   Streptococcus pneumoniae NOT DETECTED NOT DETECTED   Streptococcus pyogenes NOT DETECTED NOT DETECTED   Acinetobacter baumannii NOT DETECTED NOT DETECTED   Enterobacteriaceae species NOT DETECTED NOT DETECTED   Enterobacter cloacae complex NOT DETECTED NOT DETECTED   Escherichia coli NOT DETECTED NOT DETECTED   Klebsiella oxytoca NOT DETECTED NOT DETECTED   Klebsiella pneumoniae NOT DETECTED NOT DETECTED   Proteus species NOT DETECTED NOT DETECTED   Serratia marcescens NOT DETECTED NOT DETECTED   Haemophilus influenzae NOT DETECTED NOT DETECTED   Neisseria meningitidis NOT DETECTED NOT DETECTED   Pseudomonas aeruginosa NOT DETECTED NOT DETECTED   Candida albicans NOT DETECTED NOT DETECTED   Candida glabrata NOT DETECTED NOT DETECTED   Candida krusei NOT DETECTED NOT DETECTED   Candida parapsilosis NOT DETECTED NOT DETECTED    Candida tropicalis NOT DETECTED NOT DETECTED     Arturo Morton, PharmD, BCPS Please check AMION for all [place] contact numbers Clinical Pharmacist 05/24/2020 10:18 AM

## 2020-05-24 NOTE — Consult Note (Signed)
Orthopaedic Trauma Service (OTS) Consult   Patient ID: [name] MRN: [id number] DOB/AGE: [birth year] 41 y.o.  Reason for Consult: Left hip fracture Referring Physician: Blanchie Dessert, MD [facility]  HPI: [name] is an 39 y.o. male who is being seen in consultation at the request of Dr. Maryan Rued for evaluation of left hip fracture.  Patient has a history of alcohol abuse and seizures who presented yesterday after 3 days without drinking and 2 days of seizure history.  He had a altered mental status and was concerned about alcohol withdrawal.  He was found to have a left hip fracture and I was called.  Patient was subsequently intubated in the emergency room yesterday evening.  He was admitted to the ICU.  Critical care is managing him overnight.  Patient is currently on continuous EEG with neurology consulting for his seizures.  Patient was seen and evaluated this morning on 4 N.  He is intubated does not follow commands due to sedation.  He was on continuous EEG.  He has a history of a left BKA below the fractured hip.  No family is at bedside.  He does have a prosthesis in the room with him.  Past Medical History:  Diagnosis Date  . Alcohol abuse   . Seizures ([place])   . Stroke [facility])     Past Surgical History:  Procedure Laterality Date  . Above knee amputation of left leg    . BELOW KNEE LEG AMPUTATION     LEFT  . below the knee amputation      Family History  Problem Relation Age of Onset  . Heart attack Mother        Negative Hx    Social History:  reports that he has been smoking cigarettes. He has a 6.60 pack-year smoking history. He has never used smokeless tobacco. He reports current alcohol use of about 2.0 standard drinks of alcohol per week. He reports that he does not use drugs.  Allergies: No Known Allergies  Medications:  No current facility-administered medications on file prior to encounter.   Current Outpatient Medications on File Prior to  Encounter  Medication Sig Dispense Refill  . ibuprofen (ADVIL,MOTRIN) 600 MG tablet Take 1 tablet (600 mg total) by mouth every 8 (eight) hours as needed for moderate pain. (Patient not taking: Reported on 08/18/2018) 21 tablet 0  . levETIRAcetam (KEPPRA) 500 MG tablet Take 1 tablet (500 mg total) by mouth 2 (two) times daily. 60 tablet 1    ROS: Unable to obtain due to patient's mental status.  Exam: Blood pressure (!) 83/65, pulse (!) 110, temperature 99.4 F (37.4 C), temperature source Oral, resp. rate 18, height 5\' 8"  (1.727 m), weight 79.3 kg, SpO2 100 %. General: Intubated and sedated Orientation: Unable to assess Mood and Affect: Unable to assess due to intubation and sedation Gait: Unable to assess Coordination and balance: Unable to assess   Left lower extremity: Skin without lesions.  Notable swelling to the proximal hip.  He does withdrawal to manipulation of the leg.  His stump to his left lower extremity is without any problems.  Unable to perform neurovascular exam secondary to amputation status.  Knee without any obvious swelling or deformity.  Right lower extremity: Skin without lesions.  Does not withdrawal to pain or manipulation.  Unable to cooperate with neuro exam   Medical Decision Making: Data: Imaging: X-rays of the pelvis and left hip show a comminuted intertrochanteric femur fracture with notable varus and  shortening.  Labs:  Results for orders placed or performed during the hospital encounter of 05/23/20 (from the past 24 hour(s))  Lactic acid, plasma     Status: Abnormal   Collection Time: 05/23/20  3:13 PM  Result Value Ref Range   Lactic Acid, Venous 8.3 (HH) 0.5 - 1.9 mmol/L  Comprehensive metabolic panel     Status: Abnormal   Collection Time: 05/23/20  3:13 PM  Result Value Ref Range   Sodium 131 (L) 135 - 145 mmol/L   Potassium 4.5 3.5 - 5.1 mmol/L   Chloride 97 (L) 98 - 111 mmol/L   CO2 14 (L) 22 - 32 mmol/L   Glucose, Bld 94 70 - 99 mg/dL    BUN 8 6 - 20 mg/dL   Creatinine, Ser 1.49 (H) 0.61 - 1.24 mg/dL   Calcium 8.5 (L) 8.9 - 10.3 mg/dL   Total Protein 7.0 6.5 - 8.1 g/dL   Albumin 2.6 (L) 3.5 - 5.0 g/dL   AST 141 (H) 15 - 41 U/L   ALT 34 0 - 44 U/L   Alkaline Phosphatase 178 (H) 38 - 126 U/L   Total Bilirubin 3.4 (H) 0.3 - 1.2 mg/dL   GFR calc non Af Amer 58 (L) >60 mL/min   GFR calc Af Amer >60 >60 mL/min   Anion gap 20 (H) 5 - 15  CBC WITH DIFFERENTIAL     Status: Abnormal   Collection Time: 05/23/20  3:13 PM  Result Value Ref Range   WBC 13.3 (H) 4.0 - 10.5 K/uL   RBC 3.19 (L) 4.22 - 5.81 MIL/uL   Hemoglobin 8.3 (L) 13.0 - 17.0 g/dL   HCT 28.3 (L) 39 - 52 %   MCV 88.7 80.0 - 100.0 fL   MCH 26.0 26.0 - 34.0 pg   MCHC 29.3 (L) 30.0 - 36.0 g/dL   RDW 21.0 (H) 11.5 - 15.5 %   Platelets PLATELET CLUMPS NOTED ON SMEAR, UNABLE TO ESTIMATE 150 - 400 K/uL   nRBC 1.1 (H) 0.0 - 0.2 %   Neutrophils Relative % 77 %   Neutro Abs 10.2 (H) 1.7 - 7.7 K/uL   Lymphocytes Relative 11 %   Lymphs Abs 1.5 0.7 - 4.0 K/uL   Monocytes Relative 11 %   Monocytes Absolute 1.5 (H) 0 - 1 K/uL   Eosinophils Relative 0 %   Eosinophils Absolute 0.0 0 - 0 K/uL   Basophils Relative 0 %   Basophils Absolute 0.0 0 - 0 K/uL   Immature Granulocytes 1 %   Abs Immature Granulocytes 0.13 (H) 0.00 - 0.07 K/uL  APTT     Status: None   Collection Time: 05/23/20  3:13 PM  Result Value Ref Range   aPTT 35 24 - 36 seconds  Protime-INR     Status: Abnormal   Collection Time: 05/23/20  3:13 PM  Result Value Ref Range   Prothrombin Time 16.5 (H) 11.4 - 15.2 seconds   INR 1.4 (H) 0.8 - 1.2  CK     Status: Abnormal   Collection Time: 05/23/20  3:13 PM  Result Value Ref Range   Total CK 405 (H) 49.0 - 397.0 U/L  Urinalysis, Routine w reflex microscopic     Status: Abnormal   Collection Time: 05/23/20  3:14 PM  Result Value Ref Range   Color, Urine AMBER (A) YELLOW   APPearance HAZY (A) CLEAR   Specific Gravity, Urine 1.027 1.005 - 1.030   pH 5.0  5.0 - 8.0  Glucose, UA 50 (A) NEGATIVE mg/dL   Hgb urine dipstick NEGATIVE NEGATIVE   Bilirubin Urine SMALL (A) NEGATIVE   Ketones, ur 20 (A) NEGATIVE mg/dL   Protein, ur 100 (A) NEGATIVE mg/dL   Nitrite NEGATIVE NEGATIVE   Leukocytes,Ua NEGATIVE NEGATIVE   RBC / HPF 0-5 0 - 5 RBC/hpf   WBC, UA 0-5 0 - 5 WBC/hpf   Bacteria, UA RARE (A) NONE SEEN   Squamous Epithelial / LPF 0-5 0 - 5   Mucus PRESENT    Hyaline Casts, UA PRESENT   Ammonia     Status: Abnormal   Collection Time: 05/23/20  3:14 PM  Result Value Ref Range   Ammonia 46 (H) 9 - 35 umol/L  Ethanol     Status: None   Collection Time: 05/23/20  3:14 PM  Result Value Ref Range   Alcohol, Ethyl (B) <10 <10 mg/dL  Rapid urine drug screen (hospital performed)     Status: Abnormal   Collection Time: 05/23/20  3:14 PM  Result Value Ref Range   Opiates NONE DETECTED NONE DETECTED   Cocaine NONE DETECTED NONE DETECTED   Benzodiazepines NONE DETECTED NONE DETECTED   Amphetamines POSITIVE (A) NONE DETECTED   Tetrahydrocannabinol NONE DETECTED NONE DETECTED   Barbiturates NONE DETECTED NONE DETECTED  Blood Culture (routine x 2)     Status: None (Preliminary result)   Collection Time: 05/23/20  4:55 PM   Specimen: BLOOD LEFT FOREARM  Result Value Ref Range   Specimen Description BLOOD LEFT FOREARM    Special Requests      BOTTLES DRAWN AEROBIC AND ANAEROBIC Blood Culture adequate volume   Culture      NO GROWTH < 24 HOURS Performed at [facility], [address], [place], [place] [address]    Report Status PENDING   Blood Culture (routine x 2)     Status: None (Preliminary result)   Collection Time: 05/23/20  5:10 PM   Specimen: BLOOD  Result Value Ref Range   Specimen Description BLOOD RIGHT ANTECUBITAL    Special Requests      BOTTLES DRAWN AEROBIC AND ANAEROBIC Blood Culture adequate volume   Culture      NO GROWTH < 24 HOURS Performed at [facility], [place] [address], [place], [place] [address]     Report Status PENDING   Lactic acid, plasma     Status: Abnormal   Collection Time: 05/23/20  5:36 PM  Result Value Ref Range   Lactic Acid, Venous 9.5 (HH) 0.5 - 1.9 mmol/L  Salicylate level     Status: Abnormal   Collection Time: 05/23/20  5:36 PM  Result Value Ref Range   Salicylate Lvl <0.1 (L) 7.0 - 30.0 mg/dL  Acetaminophen level     Status: Abnormal   Collection Time: 05/23/20  5:36 PM  Result Value Ref Range   Acetaminophen (Tylenol), Serum <10 (L) 10 - 30 ug/mL  POC CBG, ED     Status: Abnormal   Collection Time: 05/23/20  5:46 PM  Result Value Ref Range   Glucose-Capillary 39 (LL) 70 - 99 mg/dL   Comment 1 Notify RN   CBG monitoring, ED     Status: None   Collection Time: 05/23/20  5:49 PM  Result Value Ref Range   Glucose-Capillary 77 70 - 99 mg/dL  SARS Coronavirus 2 by RT PCR (hospital order, performed in [facility] hospital lab) Nasopharyngeal Nasopharyngeal Swab     Status: None  Collection Time: 05/23/20  7:15 PM   Specimen: Nasopharyngeal Swab  Result Value Ref Range   SARS Coronavirus 2 NEGATIVE NEGATIVE  I-STAT 7, (LYTES, BLD GAS, ICA, H+H)     Status: Abnormal   Collection Time: 05/23/20  8:09 PM  Result Value Ref Range   pH, Arterial 7.382 7.35 - 7.45   pCO2 arterial 39.6 32 - 48 mmHg   pO2, Arterial 354 (H) 83 - 108 mmHg   Bicarbonate 23.5 20.0 - 28.0 mmol/L   TCO2 25 22 - 32 mmol/L   O2 Saturation 100.0 %   Acid-base deficit 1.0 0.0 - 2.0 mmol/L   Sodium 130 (L) 135 - 145 mmol/L   Potassium 3.4 (L) 3.5 - 5.1 mmol/L   Calcium, Ion 1.08 (L) 1.15 - 1.40 mmol/L   HCT 27.0 (L) 39 - 52 %   Hemoglobin 9.2 (L) 13.0 - 17.0 g/dL   Patient temperature 99.4 F    Collection site Radial    Drawn by HIDE    Sample type ARTERIAL   MRSA PCR Screening     Status: None   Collection Time: 05/23/20  9:15 PM   Specimen: Nasal Mucosa; Nasopharyngeal  Result Value Ref Range   MRSA by PCR NEGATIVE NEGATIVE  I-STAT 7, (LYTES, BLD GAS, ICA, H+H)     Status: Abnormal    Collection Time: 05/24/20  3:49 AM  Result Value Ref Range   pH, Arterial 7.490 (H) 7.35 - 7.45   pCO2 arterial 32.7 32 - 48 mmHg   pO2, Arterial 156 (H) 83 - 108 mmHg   Bicarbonate 25.0 20.0 - 28.0 mmol/L   TCO2 26 22 - 32 mmol/L   O2 Saturation 100.0 %   Acid-Base Excess 2.0 0.0 - 2.0 mmol/L   Sodium 133 (L) 135 - 145 mmol/L   Potassium 3.8 3.5 - 5.1 mmol/L   Calcium, Ion 1.12 (L) 1.15 - 1.40 mmol/L   HCT 25.0 (L) 39 - 52 %   Hemoglobin 8.5 (L) 13.0 - 17.0 g/dL   Patient temperature 98.3 F    Collection site Radial    Drawn by HIDE    Sample type ARTERIAL      Imaging or Labs ordered: No new imaging  Medical history and chart was reviewed and case discussed with medical provider.  Assessment/Plan: 39 year old male with a left intertrochanteric femur fracture.  Patient also is in alcohol withdrawal with subsequent seizures.  Patient will require cephalomedullary nailing of his left intertrochanteric femur fracture.  At this time due to the other medical comorbidities and seizure activity, I will delay his surgery.  We will tentatively plan to place him on the surgery schedule for tomorrow morning.  I discussed the surgery over the phone with the patient's sister.  She agrees to proceed with surgery.  Patient should be n.p.o. after midnight.  Shona Needles, MD [facility] [phone] (office) [email or web address]

## 2020-05-24 NOTE — Progress Notes (Signed)
NAME:  [name], MRN:  [id number], DOB:  Oct 30, 1981, LOS: 1 ADMISSION DATE:  05/23/2020, CONSULTATION DATE: 6/11 REFERRING MD: Dr. Maryan Rued, CHIEF COMPLAINT: Altered mental status  Brief History   39 year old male with history of alcohol abuse and seizures who presented 6/11 after 3 days without drinking and 2 days of seizures. He had deteriorating mental status in the emergency department concerning for alcohol withdrawal and ultimately required intubation. Workup also positive for fractured L hip.   History of present illness   39 year old male with PMH as below, which is significant for alcohol abuse, seizure (reportedly non-compliant with AED), and stroke. He is a daily drinker per his girlfriend, however, it is not known exactly how much. Girlfriend reports he stopped drinking just a few days prior to presentation and has been having seizures for two days. EMS called due to seizures. Upon arrival to the ED he was coherent and was able to articulate L hip pain. Imaging confirmed left intertrochanteric femur fracture. He was evaluated by orthopedics who  Have him planned for surgery on 6/12. As his course in the ED progressed he developed worsening delirium and agitation ultimately requiring intubation. PCCM asked to admit.   Past Medical History  CVA, Seizure  Significant Hospital Events   6/11 Admit  6/12 PRBC transfusion  Consults:  Neurology  Procedures:  ETT 6/11 >  Significant Diagnostic Tests:  Xray L hip 6/11 : Comminuted intertrochanteric femur fracture on the left. CT head / Cspine 6/11 > atrophy, C4-5 and C5-6 disc space narrowing and osteophyte formation  Micro Data:  Blood 6/11 > Urine 6/11 >  Antimicrobials:  Cefepime 6/11 > 6/12 Vancomycin 6/11 >>  Interim history/subjective:  Remains on vent, sedation, LTM.  Objective   Blood pressure 93/77, pulse 96, temperature 99.9 F (37.7 C), temperature source Axillary, resp. rate 18, height 5\' 8"  (1.727 m),  weight 79.3 kg, SpO2 100 %.    Vent Mode: PRVC FiO2 (%):  [30 %-100 %] 30 % Set Rate:  [16 bmp-18 bmp] 18 bmp Vt Set:  [540 mL-560 mL] 540 mL PEEP:  [5 cmH20] 5 cmH20 Plateau Pressure:  [14 cmH20-15 cmH20] 15 cmH20   Intake/Output Summary (Last 24 hours) at 05/24/2020 1048 Last data filed at 05/24/2020 8502 Gross per 24 hour  Intake 476.84 ml  Output --  Net 476.84 ml   Filed Weights   05/23/20 1900 05/23/20 2000  Weight: 70 kg 79.3 kg    Examination:  General - sedated Eyes - pupils reactive ENT - ETT in place Cardiac - regular rate/rhythm, no murmur Chest - equal breath sounds b/l, no wheezing or rales Abdomen - soft, non tender, + bowel sounds Extremities - Lt BKA Skin - no rashes Neuro - RASS -2   Resolved Hospital Problem list     Assessment & Plan:   Acute metabolic encephalopathy. Alcohol withdrawal. Methamphetamine intoxication. Seizure. - continue AEDs - continue LTM  Acute respiratory failure with hypoxia and compromised airway. - full vent support - f/u CXR  GPC in blood cultures. - continue vancomycin - d/c cefepime - f/u TTE  Left femur fracture. -Orthopedics planning for surgical repair on 6/12  Anemia. - possibly gastritis  - transfuse 1 unit PRBC 6/12 - bid protonix  Hypotension. - from anemia, and sedation medications - goal MAP > 65, SBP > 90  Hypoglycemia. - dextrose in IV fluids  Best practice:  Diet: NPO DVT prophylaxis: SCDs GI prophylaxis: PPI Glucose control: SSI Mobility:  BR Code Status: FULL Family Communication: Sister updated via phone Disposition: ICU  Labs    CMP Latest Ref Rng & Units 05/24/2020 05/24/2020 05/23/2020  Glucose 70 - 99 mg/dL 99 - -  BUN 6 - 20 mg/dL 15 - -  Creatinine 0.61 - 1.24 mg/dL 1.31(H) - -  Sodium 135 - 145 mmol/L 131(L) 133(L) 130(L)  Potassium 3.5 - 5.1 mmol/L 3.9 3.8 3.4(L)  Chloride 98 - 111 mmol/L 98 - -  CO2 22 - 32 mmol/L 21(L) - -  Calcium 8.9 - 10.3 mg/dL 7.8(L) - -   Total Protein 6.5 - 8.1 g/dL - - -  Total Bilirubin 0.3 - 1.2 mg/dL - - -  Alkaline Phos 38 - 126 U/L - - -  AST 15 - 41 U/L - - -  ALT 0 - 44 U/L - - -    CBC Latest Ref Rng & Units 05/24/2020 05/24/2020 05/23/2020  WBC 4.0 - 10.5 K/uL 11.8(H) - -  Hemoglobin 13.0 - 17.0 g/dL 6.8(LL) 8.5(L) 9.2(L)  Hematocrit 39 - 52 % 22.9(L) 25.0(L) 27.0(L)  Platelets 150 - 400 K/uL PLATELET CLUMPS NOTED ON SMEAR, COUNT APPEARS DECREASED - -    ABG    Component Value Date/Time   PHART 7.490 (H) 05/24/2020 0349   PCO2ART 32.7 05/24/2020 0349   PO2ART 156 (H) 05/24/2020 0349   HCO3 25.0 05/24/2020 0349   TCO2 26 05/24/2020 0349   ACIDBASEDEF 1.0 05/23/2020 2009   O2SAT 100.0 05/24/2020 0349    CBG (last 3)  Recent Labs    05/23/20 1746 05/23/20 1749  GLUCAP 39* 77      Critical care time: 37 minutes  Chesley Mires, MD [facility] Pager - [phone] 05/24/2020, 10:55 AM

## 2020-05-24 NOTE — Plan of Care (Signed)
?  Problem: Elimination: ?Goal: Will not experience complications related to urinary retention ?Outcome: Progressing ?  ?

## 2020-05-25 ENCOUNTER — Inpatient Hospital Stay (HOSPITAL_COMMUNITY): Payer: Medicaid Other

## 2020-05-25 ENCOUNTER — Encounter (HOSPITAL_COMMUNITY)
Admission: AD | Disposition: A | Discharge status: Home Health | Payer: Self-pay | Source: Home / Self Care | Attending: Internal Medicine

## 2020-05-25 ENCOUNTER — Inpatient Hospital Stay (HOSPITAL_COMMUNITY): Payer: Medicaid Other | Admitting: Anesthesiology

## 2020-05-25 ENCOUNTER — Encounter (HOSPITAL_COMMUNITY): Payer: Self-pay | Admitting: Pulmonary Disease

## 2020-05-25 DIAGNOSIS — R7881 Bacteremia: Secondary | ICD-10-CM

## 2020-05-25 HISTORY — PX: INTRAMEDULLARY (IM) NAIL INTERTROCHANTERIC: SHX5875

## 2020-05-25 LAB — GLUCOSE, CAPILLARY
Glucose-Capillary: 125 mg/dL — ABNORMAL HIGH (ref 70–99)
Glucose-Capillary: 124 mg/dL — ABNORMAL HIGH (ref 70–99)
Glucose-Capillary: 104 mg/dL — ABNORMAL HIGH (ref 70–99)
Glucose-Capillary: 127 mg/dL — ABNORMAL HIGH (ref 70–99)
Glucose-Capillary: 94 mg/dL (ref 70–99)

## 2020-05-25 LAB — CBC
HCT: 24.6 % — ABNORMAL LOW (ref 39.0–52.0)
Hemoglobin: 7.7 g/dL — ABNORMAL LOW (ref 13.0–17.0)
MCH: 27.4 pg (ref 26.0–34.0)
MCHC: 31.3 g/dL (ref 30.0–36.0)
MCV: 87.5 fL (ref 80.0–100.0)
Platelets: 88 10*3/uL — ABNORMAL LOW (ref 150–400)
RBC: 2.81 MIL/uL — ABNORMAL LOW (ref 4.22–5.81)
RDW: 19.4 % — ABNORMAL HIGH (ref 11.5–15.5)
WBC: 9.7 10*3/uL (ref 4.0–10.5)
nRBC: 1.1 % — ABNORMAL HIGH (ref 0.0–0.2)

## 2020-05-25 LAB — SURGICAL PCR SCREEN
MRSA, PCR: NEGATIVE
Staphylococcus aureus: NEGATIVE

## 2020-05-25 LAB — BPAM RBC
Blood Product Expiration Date: 202107112359
ISSUE DATE / TIME: 202106121710
Unit Type and Rh: 5100

## 2020-05-25 LAB — BASIC METABOLIC PANEL
Anion gap: 8 (ref 5–15)
BUN: 14 mg/dL (ref 6–20)
CO2: 25 mmol/L (ref 22–32)
Calcium: 7.8 mg/dL — ABNORMAL LOW (ref 8.9–10.3)
Chloride: 99 mmol/L (ref 98–111)
Creatinine, Ser: 1.18 mg/dL (ref 0.61–1.24)
GFR calc Af Amer: >60 mL/min (ref >60)
GFR calc non Af Amer: >60 mL/min (ref >60)
Glucose, Bld: 110 mg/dL — ABNORMAL HIGH (ref 70–99)
Potassium: 3 mmol/L — ABNORMAL LOW (ref 3.5–5.1)
Sodium: 132 mmol/L — ABNORMAL LOW (ref 135–145)

## 2020-05-25 LAB — ECHOCARDIOGRAM COMPLETE
Height: 68 in
Weight: 3033.53 oz

## 2020-05-25 LAB — TYPE AND SCREEN
ABO/RH(D): O POS
Antibody Screen: NEGATIVE
Unit division: 0

## 2020-05-25 LAB — URINE CULTURE: Culture: 5000 — AB

## 2020-05-25 SURGERY — FIXATION, FRACTURE, INTERTROCHANTERIC, WITH INTRAMEDULLARY ROD
Anesthesia: General | Site: Hip | Laterality: Left

## 2020-05-25 MED ORDER — POTASSIUM CHLORIDE 20 MEQ/15ML (10%) PO SOLN: 20.0000 meq | ORAL | Status: AC

## 2020-05-25 MED ORDER — PROPOFOL 10 MG/ML IV BOLUS
INTRAVENOUS | Status: AC
  Filled 2020-05-25: qty 20

## 2020-05-25 MED ORDER — THIAMINE HCL 100 MG PO TABS
100.0000 mg | ORAL_TABLET | Freq: Every day | ORAL | Status: DC
  Administered 2020-05-27 – 2020-06-07 (×9): 100 mg
  Filled 2020-05-25 (×11): qty 1

## 2020-05-25 MED ORDER — MUPIROCIN 2 % EX OINT
1.0000 "application " | TOPICAL_OINTMENT | Freq: Two times a day (BID) | CUTANEOUS | Status: AC
  Administered 2020-05-25 – 2020-05-29 (×10): 1 via NASAL
  Filled 2020-05-25: qty 22

## 2020-05-25 MED ORDER — ALBUMIN HUMAN 5 % IV SOLN
INTRAVENOUS | Status: DC | PRN
Start: 2020-05-25 — End: 2020-05-25

## 2020-05-25 MED ORDER — MIDAZOLAM HCL 2 MG/2ML IJ SOLN
INTRAMUSCULAR | Status: DC | PRN
  Administered 2020-05-25: 2 mg via INTRAVENOUS

## 2020-05-25 MED ORDER — 0.9 % SODIUM CHLORIDE (POUR BTL) OPTIME
TOPICAL | Status: DC | PRN
  Administered 2020-05-25: 1000 mL

## 2020-05-25 MED ORDER — LACTATED RINGERS IV SOLN: INTRAVENOUS | Status: DC | PRN

## 2020-05-25 MED ORDER — FOLIC ACID 1 MG PO TABS
1.0000 mg | ORAL_TABLET | Freq: Every day | ORAL | Status: DC
  Administered 2020-05-27 – 2020-06-07 (×9): 1 mg
  Filled 2020-05-25 (×11): qty 1

## 2020-05-25 MED ORDER — FENTANYL CITRATE (PF) 250 MCG/5ML IJ SOLN
INTRAMUSCULAR | Status: AC
  Filled 2020-05-25: qty 5

## 2020-05-25 MED ORDER — CEFAZOLIN SODIUM-DEXTROSE 2-3 GM-%(50ML) IV SOLR
INTRAVENOUS | Status: DC | PRN
Start: 2020-05-25 — End: 2020-05-25
  Administered 2020-05-25: 2 g via INTRAVENOUS

## 2020-05-25 MED ORDER — MIDAZOLAM HCL 2 MG/2ML IJ SOLN
INTRAMUSCULAR | Status: AC
  Filled 2020-05-25: qty 2

## 2020-05-25 MED ORDER — POTASSIUM CHLORIDE 10 MEQ/100ML IV SOLN
10.0000 meq | INTRAVENOUS | Status: AC
  Administered 2020-05-25 (×4): 10 meq via INTRAVENOUS
  Filled 2020-05-25 (×4): qty 100

## 2020-05-25 MED ORDER — PHENYLEPHRINE HCL-NACL 10-0.9 MG/250ML-% IV SOLN: INTRAVENOUS | Status: DC | PRN

## 2020-05-25 MED ORDER — VANCOMYCIN HCL 1000 MG IV SOLR
INTRAVENOUS | Status: AC
  Filled 2020-05-25: qty 1000

## 2020-05-25 MED ORDER — VANCOMYCIN HCL 1000 MG IV SOLR
INTRAVENOUS | Status: DC | PRN
  Administered 2020-05-25: 1000 mg via TOPICAL

## 2020-05-25 MED ORDER — FENTANYL CITRATE (PF) 250 MCG/5ML IJ SOLN
INTRAMUSCULAR | Status: DC | PRN
  Administered 2020-05-25: 100 ug via INTRAVENOUS
  Administered 2020-05-25 (×3): 50 ug via INTRAVENOUS

## 2020-05-25 MED ORDER — ROCURONIUM BROMIDE 10 MG/ML (PF) SYRINGE
PREFILLED_SYRINGE | INTRAVENOUS | Status: DC | PRN
  Administered 2020-05-25: 40 mg via INTRAVENOUS
  Administered 2020-05-25: 60 mg via INTRAVENOUS

## 2020-05-25 MED ORDER — PHENYLEPHRINE HCL (PRESSORS) 10 MG/ML IV SOLN
INTRAVENOUS | Status: DC | PRN
  Administered 2020-05-25: 80 ug via INTRAVENOUS
  Administered 2020-05-25 (×2): 120 ug via INTRAVENOUS

## 2020-05-25 MED ORDER — ADULT MULTIVITAMIN W/MINERALS CH
1.0000 | ORAL_TABLET | Freq: Every day | ORAL | Status: DC
  Administered 2020-05-27 – 2020-06-07 (×9): 1
  Filled 2020-05-25 (×11): qty 1

## 2020-05-25 SURGICAL SUPPLY — 43 items
BIT DRILL INTERTAN LAG SCREW (BIT) ×1 IMPLANT
BIT DRILL LONG 4.0 (BIT) IMPLANT
BRUSH SCRUB EZ PLAIN DRY (MISCELLANEOUS) ×4 IMPLANT
CHLORAPREP W/TINT 26 (MISCELLANEOUS) ×2 IMPLANT
COVER SURGICAL LIGHT HANDLE (MISCELLANEOUS) ×2 IMPLANT
DERMABOND ADVANCED (GAUZE/BANDAGES/DRESSINGS) ×1
DERMABOND ADVANCED .7 DNX12 (GAUZE/BANDAGES/DRESSINGS) ×1 IMPLANT
DRAPE C-ARM 35X43 STRL (DRAPES) ×2 IMPLANT
DRAPE C-ARMOR (DRAPES) ×1 IMPLANT
DRAPE IMP U-DRAPE 54X76 (DRAPES) ×4 IMPLANT
DRAPE INCISE IOBAN 66X45 STRL (DRAPES) ×2 IMPLANT
DRAPE STERI IOBAN 125X83 (DRAPES) ×2 IMPLANT
DRAPE SURG 17X23 STRL (DRAPES) ×4 IMPLANT
DRAPE U-SHAPE 47X51 STRL (DRAPES) ×2 IMPLANT
DRILL BIT LONG 4.0 (BIT) ×2
DRSG MEPILEX BORD LITE 2X5 (GAUZE/BANDAGES/DRESSINGS) ×1 IMPLANT
DRSG MEPILEX BORDER 4X4 (GAUZE/BANDAGES/DRESSINGS) ×2 IMPLANT
DRSG MEPILEX BORDER 4X8 (GAUZE/BANDAGES/DRESSINGS) ×2 IMPLANT
GLOVE BIO SURGEON STRL SZ 6.5 (GLOVE) ×6 IMPLANT
GLOVE BIO SURGEON STRL SZ7.5 (GLOVE) ×8 IMPLANT
GLOVE BIOGEL PI IND STRL 6.5 (GLOVE) ×1 IMPLANT
GLOVE BIOGEL PI IND STRL 7.5 (GLOVE) ×1 IMPLANT
GLOVE BIOGEL PI INDICATOR 6.5 (GLOVE) ×2
GLOVE BIOGEL PI INDICATOR 7.5 (GLOVE) ×1
GOWN STRL REUS W/ TWL LRG LVL3 (GOWN DISPOSABLE) ×1 IMPLANT
GOWN STRL REUS W/TWL LRG LVL3 (GOWN DISPOSABLE) ×2
GUIDE PIN 3.2X343 (PIN) ×3
GUIDE PIN 3.2X343MM (PIN) ×3
KIT BASIN OR (CUSTOM PROCEDURE TRAY) ×2 IMPLANT
KIT TURNOVER KIT B (KITS) ×2 IMPLANT
NAIL INTERTAN 10X18 130D 10S (Nail) ×1 IMPLANT
NS IRRIG 1000ML POUR BTL (IV SOLUTION) ×2 IMPLANT
PACK GENERAL/GYN (CUSTOM PROCEDURE TRAY) ×2 IMPLANT
PAD ARMBOARD 7.5X6 YLW CONV (MISCELLANEOUS) ×4 IMPLANT
PIN GUIDE 3.2X343MM (PIN) IMPLANT
SCREW LAG COMPR KIT 105/100 (Screw) ×1 IMPLANT
SCREW TRIGEN LOW PROF 5.0X35 (Screw) ×1 IMPLANT
SUT MNCRL AB 3-0 PS2 18 (SUTURE) ×2 IMPLANT
SUT VIC AB 0 CT1 27 (SUTURE) ×2
SUT VIC AB 0 CT1 27XBRD ANBCTR (SUTURE) IMPLANT
SUT VIC AB 2-0 CT1 27 (SUTURE) ×2
SUT VIC AB 2-0 CT1 TAPERPNT 27 (SUTURE) ×2 IMPLANT
TOWEL GREEN STERILE (TOWEL DISPOSABLE) ×4 IMPLANT

## 2020-05-25 NOTE — Progress Notes (Signed)
Ortho Trauma Note  Patient still on continuous EEG. Neurology has not made a determination regarding ability to discontinue this as of this AM. Also, consent order placed yesterday morning for consent from family. No consent was obtained. Will delay surgery until later this AM until these issues can be sorted out.  Shona Needles, MD [facility] [phone] (office) [email or web address]

## 2020-05-25 NOTE — Progress Notes (Addendum)
Pharmacy Electrolyte Replacement  Recent Labs:  Recent Labs    05/24/20 0702 05/24/20 0702 05/25/20 0925  K 3.9   < > 3.0*  MG 1.9  --   --   PHOS 2.8  --   --   CREATININE 1.31*   < > 1.18   < > = values in this interval not displayed.    Low Critical Values (K </= 2.5, Phos </= 1, Mg </= 1) Present: None  MD Contacted: Sood, V  Plan: KCl 93meq PT q4h x 2 + K runs x 4 per protocol F/u repeat bmet in AM   Arturo Morton, PharmD, BCPS Please check AMION for all [place] contact numbers Clinical Pharmacist 05/25/2020 10:25 AM

## 2020-05-25 NOTE — Transfer of Care (Signed)
Immediate Anesthesia Transfer of Care Note  Patient: [name]  Procedure(s) Performed: INTRAMEDULLARY (IM) NAIL INTERTROCHANTRIC (Left Hip)  Patient Location: ICU  Anesthesia Type:General  Level of Consciousness: sedated and Patient remains intubated per anesthesia plan  Airway & Oxygen Therapy: Patient remains intubated per anesthesia plan and Patient placed on Ventilator (see vital sign flow sheet for setting)  Post-op Assessment: Report given to RN and Post -op Vital signs reviewed and stable  Post vital signs: Reviewed and stable  Last Vitals:  Vitals Value Taken Time  BP 98/74 05/25/20 1510  Temp    Pulse 97 05/25/20 1510  Resp 18 05/25/20 1508  SpO2 96 % 05/25/20 1510    Last Pain:  Vitals:   05/25/20 0800  TempSrc: Axillary         Complications: No complications documented.

## 2020-05-25 NOTE — Anesthesia Postprocedure Evaluation (Signed)
Anesthesia Post Note  Patient: [name]  Procedure(s) Performed: INTRAMEDULLARY (IM) NAIL INTERTROCHANTRIC (Left Hip)     Patient location during evaluation: SICU Anesthesia Type: General Level of consciousness: sedated Pain management: pain level controlled Vital Signs Assessment: post-procedure vital signs reviewed and stable Respiratory status: patient remains intubated per anesthesia plan Cardiovascular status: stable Postop Assessment: no apparent nausea or vomiting Anesthetic complications: no   No complications documented.  Last Vitals:  Vitals:   05/25/20 1508 05/25/20 1510  BP: 98/74 98/74  Pulse: 92 97  Resp: 18   Temp:    SpO2: 95% 96%    Last Pain:  Vitals:   05/25/20 1200  TempSrc: Axillary                 Sho Salguero,W. EDMOND

## 2020-05-25 NOTE — Progress Notes (Signed)
Echocardiogram 2D Echocardiogram has been performed.  Oneal Deputy Hussain Maimone 05/25/2020, 10:33 AM

## 2020-05-25 NOTE — Anesthesia Preprocedure Evaluation (Addendum)
Anesthesia Evaluation  Patient identified by MRN, date of birth, ID band Patient unresponsive    Reviewed: Allergy & Precautions, H&P , NPO status , Patient's Chart, lab work & pertinent test results  Airway Mallampati: Intubated       Dental no notable dental hx. (+) Teeth Intact, Dental Advisory Given   Pulmonary asthma , Current Smoker and Patient abstained from smoking.,  Intubated   Pulmonary exam normal breath sounds clear to auscultation       Cardiovascular negative cardio ROS   Rhythm:Regular Rate:Normal     Neuro/Psych Seizures -, Poorly Controlled,  negative psych ROS   GI/Hepatic negative GI ROS, (+)     substance abuse  alcohol use and methamphetamine use,   Endo/Other  negative endocrine ROS  Renal/GU negative Renal ROS  negative genitourinary   Musculoskeletal   Abdominal   Peds  Hematology negative hematology ROS (+)   Anesthesia Other Findings   Reproductive/Obstetrics negative OB ROS                             Anesthesia Physical Anesthesia Plan  ASA: IV  Anesthesia Plan: General   Post-op Pain Management:    Induction: Intravenous  PONV Risk Score and Plan: 1 and Midazolam and Ondansetron  Airway Management Planned: Oral ETT  Additional Equipment:   Intra-op Plan:   Post-operative Plan: Post-operative intubation/ventilation  Informed Consent: I have reviewed the patients History and Physical, chart, labs and discussed the procedure including the risks, benefits and alternatives for the proposed anesthesia with the patient or authorized representative who has indicated his/her understanding and acceptance.     Dental advisory given  Plan Discussed with: CRNA  Anesthesia Plan Comments:         Anesthesia Quick Evaluation

## 2020-05-25 NOTE — Progress Notes (Signed)
LTM EEG discontinued - no skin breakdown at unhook.   

## 2020-05-25 NOTE — Progress Notes (Signed)
NAME:  [name], MRN:  [id number], DOB:  February 28, 1981, LOS: 2 ADMISSION DATE:  05/23/2020, CONSULTATION DATE: 6/11 REFERRING MD: Dr. Maryan Rued, CHIEF COMPLAINT: Altered mental status  Brief History   39 yo male with hx of ETOH and seizure disordered presented after drinking binge and then alcohol withdrawal seizures.  Required intubation for airway protection.  Found to also have Lt hip fracture.  He has been non-compliant with his medications.  UDS positive for amphetamines.  Past Medical History  CVA, Seizure  Significant Hospital Events   6/11 Admit  6/12 PRBC transfusion 6/13 off LTM  Consults:  Neurology Ortho  Procedures:  ETT 6/11 >  Significant Diagnostic Tests:   Xray L hip 6/11  > Comminuted intertrochanteric femur fracture on the left.  CT head / Cspine 6/11 > atrophy, C4-5 and C5-6 disc space narrowing and osteophyte formation  Echo 6/13 >>  Micro Data:  Blood 6/11 > GPC >>  Urine 6/11 > Staph haemolyticus  Blood 6/13 >>  Antimicrobials:  Cefepime 6/11 > 6/12 Vancomycin 6/11 >>  Interim history/subjective:  Remains on vent, sedation.  Objective   Blood pressure 120/67, pulse (!) 54, temperature 99.7 F (37.6 C), temperature source Axillary, resp. rate 18, height 5\' 8"  (1.727 m), weight 86 kg, SpO2 100 %.    Vent Mode: PRVC FiO2 (%):  [30 %] 30 % Set Rate:  [18 bmp] 18 bmp Vt Set:  [540 mL] 540 mL PEEP:  [5 cmH20] 5 cmH20 Plateau Pressure:  [13 cmH20-16 cmH20] 16 cmH20   Intake/Output Summary (Last 24 hours) at 05/25/2020 0829 Last data filed at 05/25/2020 0800 Gross per 24 hour  Intake 2638.57 ml  Output 1425 ml  Net 1213.57 ml   Filed Weights   05/23/20 1900 05/23/20 2000 05/25/20 0417  Weight: 70 kg 79.3 kg 86 kg    Examination:  General - sedated Eyes - pupils reactive ENT - ETT in place Cardiac - regular rate/rhythm, no murmur Chest - equal breath sounds b/l, no wheezing or rales Abdomen - soft, non tender, + bowel  sounds Extremities - Rt BKA Skin - no rashes Neuro - RASS -2   Resolved Hospital Problem list   Hypotension from anemia and sedation, Hypoglycemia  Assessment & Plan:   Acute metabolic encephalopathy from alcohol withdrawal with alcohol withdrawal seizures. Methamphetamine intoxication. - continue keppra - thiamine, folic acid, MVI  Acute respiratory failure with hypoxia and compromised airway. - continue vent support until he has hip surgery - f/u CXR intermittently  GPC in blood cultures. - continue vancomycin - repeat blood cultures 6/13 - f/u TTE   Left femur fracture. - should defer surgery until after Echo done to exclude endocarditis; if no evidence for endocarditis, then should be able to proceed with surgery; have asked ICU team to try and expedite Echo  Anemia. Thrombocytopenia. - possibly from gastritis  - bid protonix - f/u CBC, iron levels - transfuse for Hb < 7 or significant bleeding - defer to ortho and anesthesia whether he will need PLT transfusion prior to surgery  Best practice:  Diet: NPO DVT prophylaxis: SCDs GI prophylaxis: PPI Glucose control: SSI Mobility: BR Code Status: FULL Family Communication: Sister updated via phone Disposition: ICU  Labs    CMP Latest Ref Rng & Units 05/24/2020 05/24/2020 05/23/2020  Glucose 70 - 99 mg/dL 99 - -  BUN 6 - 20 mg/dL 15 - -  Creatinine 0.61 - 1.24 mg/dL 1.31(H) - -  Sodium 135 - 145 mmol/L  131(L) 133(L) 130(L)  Potassium 3.5 - 5.1 mmol/L 3.9 3.8 3.4(L)  Chloride 98 - 111 mmol/L 98 - -  CO2 22 - 32 mmol/L 21(L) - -  Calcium 8.9 - 10.3 mg/dL 7.8(L) - -  Total Protein 6.5 - 8.1 g/dL - - -  Total Bilirubin 0.3 - 1.2 mg/dL - - -  Alkaline Phos 38 - 126 U/L - - -  AST 15 - 41 U/L - - -  ALT 0 - 44 U/L - - -    CBC Latest Ref Rng & Units 05/24/2020 05/24/2020 05/24/2020  WBC 4.0 - 10.5 K/uL 12.2(H) 11.8(H) -  Hemoglobin 13.0 - 17.0 g/dL 7.8(L) 6.8(LL) 8.5(L)  Hematocrit 39 - 52 % 25.1(L) 22.9(L)  25.0(L)  Platelets 150 - 400 K/uL 86(L) PLATELET CLUMPS NOTED ON SMEAR, COUNT APPEARS DECREASED -    ABG    Component Value Date/Time   PHART 7.490 (H) 05/24/2020 0349   PCO2ART 32.7 05/24/2020 0349   PO2ART 156 (H) 05/24/2020 0349   HCO3 25.0 05/24/2020 0349   TCO2 26 05/24/2020 0349   ACIDBASEDEF 1.0 05/23/2020 2009   O2SAT 100.0 05/24/2020 0349    CBG (last 3)  Recent Labs    05/24/20 2307 05/25/20 0339 05/25/20 0727  GLUCAP 121* 125* 124*      Critical care time: 32 minutes  Chesley Mires, MD [place] Pager - [phone] 05/25/2020, 8:29 AM

## 2020-05-25 NOTE — Interval H&P Note (Signed)
History and Physical Interval Note:  05/25/2020 11:24 AM  [name]  has presented today for surgery, with the diagnosis of Left intertrochanteric femur fracture.  The various methods of treatment have been discussed with the patient and family. After consideration of risks, benefits and other options for treatment, the patient has consented to  Procedure(s): INTRAMEDULLARY (IM) NAIL INTERTROCHANTRIC (Left) as a surgical intervention.  The patient's history has been reviewed, patient examined, no change in status, stable for surgery.  I have reviewed the patient's chart and labs.  Questions were answered to the patient's satisfaction.     Lennette Bihari P Clarabel Marion

## 2020-05-25 NOTE — Progress Notes (Signed)
Subjective: No further seizures.  Exam: Vitals:   05/25/20 1059 05/25/20 1100  BP: (!) 149/85 (!) 149/85  Pulse: 71 64  Resp: (!) 21   Temp:    SpO2: 100% 100%   Gen: In bed, intubated Resp: ventilated.  Abd: soft, nt  Neuro: MS: Opens eyes and follows commands. CN: Pupils reactive bilaterally, crosses midline in both directions, eyes more dysconjugate depending on  Motor: Moves all extremities spontaneously  Impression: 39 year old male with a history of focal seizures who presented in status epilepticus secondary to medication noncompliance and amphetamine use.  He is improving and waking up and no seizures on EEG, I would continue monitoring while propofol is weaned and then can discontinue if no seizures by tomorrow.  Recommendations: 1) continue Keppra 750 twice daily 2) d/c LTM 3) no further recommendations at this time. If questions remain, or there are concerns after extubation, then please call.   Roland Rack, MD [facility] [phone]  If 7pm- 7am, please page neurology on call as listed in [place].

## 2020-05-25 NOTE — Op Note (Signed)
Orthopaedic Surgery Operative Note (CSN: [id number] ) Date of Surgery: 05/25/2020  Admit Date: 05/23/2020   Diagnoses: Pre-Op Diagnoses: Left intertrochanteric femur fracture  Post-Op Diagnosis: Same  Procedures: CPT 27245-Cephalomedullary nailing of left intertrochanteric femur fracture  Surgeons : Primary: Shona Needles, MD  Assistant: Patrecia Pace, PA-C  Location: OR 3   Anesthesia:General  Antibiotics: Ancef 2g preop   Tourniquet time:None  Estimated Blood OTLX:726 mL  Complications:None   Specimens:None  Implants: Implant Name Type Inv. Item Serial No. Manufacturer Lot No. LRB No. 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  He presented to the emergency room in withdrawal with a multiple seizure events.  He was stabilized by the critical care team and was found fit to undergo surgical fixation of his left hip.  Risks and benefits were discussed with the patient's sister.  She agreed to proceed with surgery and consent was obtained.  Operative Findings: Cephalomedullary nailing of left intertrochanteric femur fracture using [employer] InterTAN 10 x 180 mm nail with a 105 mm lag screw and 100 mm compression screw  Procedure: The patient was identified in the ICU. Consent was confirmed with the family and all questions were answered. The operative extremity was marked after confirmation with the patient. he was then brought back to  the operating room by our anesthesia colleagues.  He was placed under general anesthetic and carefully transferred over to a radiolucent flat top table.  A bump was placed under his operative hip.  The left lower extremity was then prepped and draped in usual sterile fashion.  A timeout was performed to verify the patient the procedure, and the extremity.  Preoperative antibiotics were dosed.  Fluoroscopic imaging showed the unstable nature of his injury.  The knee and hip were flexed over a triangle.  A distal femoral traction pin was placed from medial to lateral to assist with a reduction in allowed my assistant to pull traction.  Traction was applied.  Incision was made proximal to the greater trochanter.  The gluteal fascia was split in line with the incision.  I then directed a threaded guidewire at the tip of the trochanter into the proximal femur.  I confirmed positioning with AP and lateral fluoroscopic imaging.  I then used an entry reamer to enter the canal.  I then passed the nail and seated it until it was in appropriate position on AP and lateral fluoroscopic imaging.  I then made a incision percutaneously along the lateral aspect of the femur and directed a threaded guidewire into the head neck segment.  I advanced it until it was an appropriate position with a adequate tip apex distance.  I then measured the length and proceeded to drill the compression screw path and place a antirotation bar.  The head neck segment did kick into a slight amount of valgus but I felt that I could reduce that with the compression of the nail device.  I then  drilled and placed the lag screw at an appropriate tip apex distance.  I then used my compression screw to compress approximately 1 cm.  I was able to get some improved alignment but the head neck segment was still in valgus.  I felt that I could accept that as it was in appropriate position overall with adequate length and the lateral was is in appropriate  alignment.  The setscrew was then tightened.  I made sure that it was statically locked.  I then used the targeting arm to place a distal interlocking screw.   The targeting arm was removed.  Final fluoroscopic imaging was obtained.  The incisions were copiously irrigated.  They were closed with 0 Vicryl, 2-0 Vicryl 3-0 Monocryl and Dermabond.  Sterile dressings were placed.  The K wire in the distal femur was removed.  The patient was then transferred to a regular bed and taken up to the ICU in stable condition.  Post Op Plan/Instructions: Patient will be weightbearing as tolerated to his left lower extremity.  He will receive postoperative Ancef.  He will be started on Lovenox for DVT prophylaxis once cleared by the medical team.  We will have him mobilized with physical and occupational therapy once he is able to.  I was present and performed the entire surgery.  Patrecia Pace, PA-C did assist me throughout the case. An assistant was necessary given the difficulty in approach, maintenance of reduction and ability to instrument the fracture.   Katha Hamming, MD [facility]

## 2020-05-25 NOTE — Procedures (Signed)
Patient Name:[name] MMH:[id number] Epilepsy Attending:Miliyah Luper Barbra Sarks Referring Physician/Provider:Dr Roland Rack Duration:05/24/2020 1594 to 05/25/2020 0730  Patient history:39 year old male with focal seizures with recurrent seizures without return to baseline, consistent with status epilepticus.EEG to evaluate for seizure  Level of alertness:awake asleep  AEDs during EEG study:keppra, Propofol  Technical aspects: This EEG study was done with scalp electrodes positioned according to the 10-20 International system of electrode placement. Electrical activity was acquired at a sampling rate of 500Hz  and reviewed with a high frequency filter of 70Hz  and a low frequency filter of 1Hz . EEG data were recorded continuously and digitally stored.   Description: No clear posterior dominant rhythm was seen. Sleep was characterized by vertex waves, sleep spindles (12-14Hz ), maximal frontocentral region. EEG showed continuous generalized 3 to 6 Hz theta-delta slowingadmixed with15 to 18 Hz beta activity with irregular morphology distributed symmetrically and diffusely. Hyperventilation and photic stimulation were not performed.   ABNORMALITY -Excessive beta, generalized -Continuousslow, generalized  IMPRESSION: This study is suggestive of moderate to severe diffuse encephalopathy, nonspecific etiology but likely related to sedation.The excessive beta activity seen in the background is most likely due to the effect of benzodiazepine and is a benign EEG pattern. No seizures or epileptiform discharges were seen throughout the recording.  [name] Barbra Sarks

## 2020-05-26 ENCOUNTER — Inpatient Hospital Stay (HOSPITAL_COMMUNITY): Payer: Medicaid Other

## 2020-05-26 DIAGNOSIS — J9600 Acute respiratory failure, unspecified whether with hypoxia or hypercapnia: Secondary | ICD-10-CM

## 2020-05-26 DIAGNOSIS — Z419 Encounter for procedure for purposes other than remedying health state, unspecified: Secondary | ICD-10-CM

## 2020-05-26 DIAGNOSIS — J9690 Respiratory failure, unspecified, unspecified whether with hypoxia or hypercapnia: Secondary | ICD-10-CM | POA: Diagnosis present

## 2020-05-26 DIAGNOSIS — Z8781 Personal history of (healed) traumatic fracture: Secondary | ICD-10-CM

## 2020-05-26 LAB — CBC
HCT: 24.1 % — ABNORMAL LOW (ref 39.0–52.0)
Hemoglobin: 7.2 g/dL — ABNORMAL LOW (ref 13.0–17.0)
MCH: 27 pg (ref 26.0–34.0)
MCHC: 29.9 g/dL — ABNORMAL LOW (ref 30.0–36.0)
MCV: 90.3 fL (ref 80.0–100.0)
Platelets: 83 10*3/uL — ABNORMAL LOW (ref 150–400)
RBC: 2.67 MIL/uL — ABNORMAL LOW (ref 4.22–5.81)
RDW: 20.3 % — ABNORMAL HIGH (ref 11.5–15.5)
WBC: 9.7 10*3/uL (ref 4.0–10.5)
nRBC: 2.4 % — ABNORMAL HIGH (ref 0.0–0.2)

## 2020-05-26 LAB — PHOSPHORUS
Phosphorus: 1.9 mg/dL — ABNORMAL LOW (ref 2.5–4.6)
Phosphorus: 1.5 mg/dL — ABNORMAL LOW (ref 2.5–4.6)

## 2020-05-26 LAB — BASIC METABOLIC PANEL
Anion gap: 7 (ref 5–15)
BUN: 9 mg/dL (ref 6–20)
CO2: 23 mmol/L (ref 22–32)
Calcium: 7.5 mg/dL — ABNORMAL LOW (ref 8.9–10.3)
Chloride: 104 mmol/L (ref 98–111)
Creatinine, Ser: 1.04 mg/dL (ref 0.61–1.24)
GFR calc Af Amer: >60 mL/min (ref >60)
GFR calc non Af Amer: >60 mL/min (ref >60)
Glucose, Bld: 173 mg/dL — ABNORMAL HIGH (ref 70–99)
Potassium: 3.2 mmol/L — ABNORMAL LOW (ref 3.5–5.1)
Sodium: 134 mmol/L — ABNORMAL LOW (ref 135–145)

## 2020-05-26 LAB — GLUCOSE, CAPILLARY
Glucose-Capillary: 39 mg/dL — CL (ref 70–99)
Glucose-Capillary: 114 mg/dL — ABNORMAL HIGH (ref 70–99)
Glucose-Capillary: 114 mg/dL — ABNORMAL HIGH (ref 70–99)
Glucose-Capillary: 115 mg/dL — ABNORMAL HIGH (ref 70–99)
Glucose-Capillary: 105 mg/dL — ABNORMAL HIGH (ref 70–99)
Glucose-Capillary: 114 mg/dL — ABNORMAL HIGH (ref 70–99)
Glucose-Capillary: 124 mg/dL — ABNORMAL HIGH (ref 70–99)

## 2020-05-26 LAB — IRON AND TIBC
Iron: 9 ug/dL — ABNORMAL LOW (ref 45–182)
Saturation Ratios: 3 % — ABNORMAL LOW (ref 17.9–39.5)
TIBC: 273 ug/dL (ref 250–450)
UIBC: 264 ug/dL

## 2020-05-26 LAB — TRIGLYCERIDES: Triglycerides: 210 mg/dL — ABNORMAL HIGH (ref <150)

## 2020-05-26 LAB — FERRITIN: Ferritin: 44 ng/mL (ref 24–336)

## 2020-05-26 LAB — MAGNESIUM
Magnesium: 1.6 mg/dL — ABNORMAL LOW (ref 1.7–2.4)
Magnesium: 1.6 mg/dL — ABNORMAL LOW (ref 1.7–2.4)

## 2020-05-26 LAB — VANCOMYCIN, TROUGH: Vancomycin Tr: 16 ug/mL (ref 15–20)

## 2020-05-26 LAB — TROPONIN I (HIGH SENSITIVITY): Troponin I (High Sensitivity): 129 ng/L (ref <18)

## 2020-05-26 LAB — VITAMIN D 25 HYDROXY (VIT D DEFICIENCY, FRACTURES): Vit D, 25-Hydroxy: 7.85 ng/mL — ABNORMAL LOW (ref 30–100)

## 2020-05-26 MED ORDER — PRO-STAT SUGAR FREE PO LIQD
30.0000 mL | Freq: Three times a day (TID) | ORAL | Status: DC
  Administered 2020-05-26 – 2020-05-29 (×9): 30 mL
  Filled 2020-05-26 (×9): qty 30

## 2020-05-26 MED ORDER — DEXMEDETOMIDINE HCL IN NACL 400 MCG/100ML IV SOLN
0.0000 ug/kg/h | INTRAVENOUS | Status: DC
  Administered 2020-05-26: 0.4 ug/kg/h via INTRAVENOUS
  Filled 2020-05-26: qty 100

## 2020-05-26 MED ORDER — SODIUM CHLORIDE 0.9 % IV SOLN
INTRAVENOUS | Status: DC | PRN
  Administered 2020-05-26: 250 mL via INTRAVENOUS

## 2020-05-26 MED ORDER — CEFAZOLIN SODIUM-DEXTROSE 2-4 GM/100ML-% IV SOLN
2.0000 g | Freq: Three times a day (TID) | INTRAVENOUS | Status: AC
  Administered 2020-05-26 (×3): 2 g via INTRAVENOUS
  Filled 2020-05-26 (×3): qty 100

## 2020-05-26 MED ORDER — VITAL AF 1.2 CAL PO LIQD
1000.0000 mL | ORAL | Status: DC
  Administered 2020-05-26 – 2020-05-28 (×2): 1000 mL

## 2020-05-26 MED ORDER — FUROSEMIDE 10 MG/ML IJ SOLN
20.0000 mg | Freq: Once | INTRAMUSCULAR | Status: AC
  Administered 2020-05-26: 20 mg via INTRAVENOUS
  Filled 2020-05-26: qty 2

## 2020-05-26 MED ORDER — PROPOFOL 1000 MG/100ML IV EMUL
0.0000 ug/kg/min | INTRAVENOUS | Status: DC
  Administered 2020-05-26 – 2020-05-27 (×4): 50 ug/kg/min via INTRAVENOUS
  Administered 2020-05-27: 45 ug/kg/min via INTRAVENOUS
  Administered 2020-05-27: 40 ug/kg/min via INTRAVENOUS
  Administered 2020-05-27 – 2020-05-29 (×9): 50 ug/kg/min via INTRAVENOUS
  Filled 2020-05-26 (×14): qty 100
  Filled 2020-05-26 (×2): qty 200
  Filled 2020-05-26 (×2): qty 100

## 2020-05-26 MED ORDER — PROPOFOL 1000 MG/100ML IV EMUL
INTRAVENOUS | Status: AC
  Administered 2020-05-26: 20 ug/kg/min via INTRAVENOUS
  Filled 2020-05-26: qty 100

## 2020-05-26 MED ORDER — POTASSIUM PHOSPHATES 15 MMOLE/5ML IV SOLN
20.0000 mmol | Freq: Once | INTRAVENOUS | Status: AC
  Administered 2020-05-27: 20 mmol via INTRAVENOUS
  Filled 2020-05-26: qty 6.67

## 2020-05-26 MED ORDER — MAGNESIUM SULFATE 4 GM/100ML IV SOLN
4.0000 g | Freq: Once | INTRAVENOUS | Status: AC
  Administered 2020-05-26: 4 g via INTRAVENOUS
  Filled 2020-05-26: qty 100

## 2020-05-26 MED ORDER — POTASSIUM CHLORIDE 20 MEQ/15ML (10%) PO SOLN
40.0000 meq | Freq: Once | ORAL | Status: AC
  Administered 2020-05-26: 40 meq
  Filled 2020-05-26: qty 30

## 2020-05-26 NOTE — Progress Notes (Signed)
Pharmacy Antibiotic Note  [name] is a 39 y.o. male admitted on 05/23/2020 with sepsis.  Pharmacy has been consulted for vancomycin dosing.  Of note, patient is growing 2 different strains of coag negative staph in each set of blood cultures which could represent a contaminant. WBC has normalized. Afebrile. A vancomycin trough drawn today was therapeutic at 16.    Plan: Continue vancomycin 1 gm IV Q 12 hours  Monitor clinical course and will need to establish LOT    Temp (24hrs), Avg:98.7 F (37.1 C), Min:97.5 F (36.4 C), Max:100 F (37.8 C)  Recent Labs  Lab 05/23/20 1513 05/23/20 1736 05/24/20 0702 05/24/20 2224 05/25/20 0925 05/26/20 0704  WBC 13.3*  --  11.8* 12.2* 9.7 9.7  CREATININE 1.49*  --  1.31*  --  1.18 1.04  LATICACIDVEN 8.3* 9.5*  --  1.8  --   --   VANCOTROUGH  --   --   --   --   --  16    Estimated Creatinine Clearance: 100.7 mL/min (by C-G formula based on SCr of 1.04 mg/dL).    No Known Allergies  Antimicrobials this admission: Cefepime 6/11 >> 6/12 vanco 6/11 >>   Dose adjustments this admission: N/A  Microbiology results: 6/11 BCx: 2/2 staph hominis and 2/2 staph hemolyticus   6/11 UCx: (in/out cath) sent    Thank you for allowing pharmacy to be a part of this patient's care.   Albertina Parr, PharmD., BCPS, BCCCP Clinical Pharmacist Please refer to [facility] for unit-specific pharmacist

## 2020-05-26 NOTE — Progress Notes (Signed)
Bedside Physician Progress Note and Electrolyte Replacement  Patient Name: [name] DOB: 09-27-1981 MRN: [id number]  Date of Service  05/26/2020   HPI/Events of Note   Recent Labs  Lab 05/23/20 1513 05/23/20 1513 05/23/20 2009 05/23/20 2009 05/24/20 0349 05/24/20 0349 05/24/20 0702 05/24/20 0702 05/25/20 0925 05/26/20 0704 05/26/20 1048 05/26/20 1702  NA 131*   < > 130*  --  133*  --  131*  --  132* 134*  --   --   K 4.5   < > 3.4*   < > 3.8   < > 3.9   < > 3.0* 3.2*  --   --   CL 97*  --   --   --   --   --  98  --  99 104  --   --   CO2 14*  --   --   --   --   --  21*  --  25 23  --   --   GLUCOSE 94  --   --   --   --   --  99  --  110* 173*  --   --   BUN 8  --   --   --   --   --  15  --  14 9  --   --   CREATININE 1.49*  --   --   --   --   --  1.31*  --  1.18 1.04  --   --   CALCIUM 8.5*  --   --   --   --   --  7.8*  --  7.8* 7.5*  --   --   MG  --   --   --   --   --   --  1.9  --   --   --  1.6* 1.6*  PHOS  --   --   --   --   --   --  2.8  --   --   --  1.9* 1.5*   < > = values in this interval not displayed.    Estimated Creatinine Clearance: 100.7 mL/min (by C-G formula based on SCr of 1.04 mg/dL).  Intake/Output      06/13 0701 - 06/14 0700 06/14 0701 - 06/15 0700   I.V. (mL/kg) 3124.6 (36.3) 525.5 (6.1)   Blood     NG/GT  0   IV Piggyback 1493.7 507.9   Total Intake(mL/kg) 4618.2 (53.7) 1033.5 (12)   Urine (mL/kg/hr) 1300 (0.6) 1375 (1.4)   Blood 150    Total Output 1450 1375   Net +3168.2 -341.5         - I/O DETAILED x 24h    Total I/O In: 1033.5 [I.V.:525.5; IV Piggyback:507.9] Out: 1375 [Urine:1375] - I/O THIS SHIFT    ASSESSMENT Low mag Low phos  eICURN Interventions  replet both   ASSESSMENT: MAJOR ELECTROLYTE      Dr. Brand Males, M.D., [facility].C.P Pulmonary and Critical Care Medicine Staff Physician [facility] Pager: [phone], If no answer or between  15:00h -  7:00h: call [phone]  05/26/2020 6:21 PM

## 2020-05-26 NOTE — Progress Notes (Signed)
Orthopaedic Trauma Progress Note  S: No orthopaedic issues. Weaning sedation to try and extubate later today  O:  Vitals:   05/26/20 0600 05/26/20 0718  BP: 99/82   Pulse: (!) 101 93  Resp: (!) 28 (!) 24  Temp:    SpO2: 100%     Open eyes to voice Dressings clean and dry Swelling appropriate  Imaging: Stable postop imaging  Labs:  Results for orders placed or performed during the hospital encounter of 05/23/20 (from the past 24 hour(s))  Surgical PCR screen     Status: None   Collection Time: 05/25/20  9:06 AM   Specimen: Nasal Mucosa; Nasal Swab  Result Value Ref Range   MRSA, PCR NEGATIVE NEGATIVE   Staphylococcus aureus NEGATIVE NEGATIVE  CBC     Status: Abnormal   Collection Time: 05/25/20  9:25 AM  Result Value Ref Range   WBC 9.7 4.0 - 10.5 K/uL   RBC 2.81 (L) 4.22 - 5.81 MIL/uL   Hemoglobin 7.7 (L) 13.0 - 17.0 g/dL   HCT 24.6 (L) 39 - 52 %   MCV 87.5 80.0 - 100.0 fL   MCH 27.4 26.0 - 34.0 pg   MCHC 31.3 30.0 - 36.0 g/dL   RDW 19.4 (H) 11.5 - 15.5 %   Platelets 88 (L) 150 - 400 K/uL   nRBC 1.1 (H) 0.0 - 0.2 %  Basic metabolic panel     Status: Abnormal   Collection Time: 05/25/20  9:25 AM  Result Value Ref Range   Sodium 132 (L) 135 - 145 mmol/L   Potassium 3.0 (L) 3.5 - 5.1 mmol/L   Chloride 99 98 - 111 mmol/L   CO2 25 22 - 32 mmol/L   Glucose, Bld 110 (H) 70 - 99 mg/dL   BUN 14 6 - 20 mg/dL   Creatinine, Ser 1.18 0.61 - 1.24 mg/dL   Calcium 7.8 (L) 8.9 - 10.3 mg/dL   GFR calc non Af Amer >60 >60 mL/min   GFR calc Af Amer >60 >60 mL/min   Anion gap 8 5 - 15  Glucose, capillary     Status: Abnormal   Collection Time: 05/25/20 11:22 AM  Result Value Ref Range   Glucose-Capillary 104 (H) 70 - 99 mg/dL  Glucose, capillary     Status: Abnormal   Collection Time: 05/25/20  3:29 PM  Result Value Ref Range   Glucose-Capillary 127 (H) 70 - 99 mg/dL  Glucose, capillary     Status: None   Collection Time: 05/25/20  7:51 PM  Result Value Ref Range    Glucose-Capillary 94 70 - 99 mg/dL  Glucose, capillary     Status: Abnormal   Collection Time: 05/26/20  3:30 AM  Result Value Ref Range   Glucose-Capillary 114 (H) 70 - 99 mg/dL    Assessment: 39 yo male w/ L intertrochanteric femur fracture  Injuries: Left intertrochanteric femur fracture s/p cephalomedullary nailing  Weightbearing: WBAT  Insicional and dressing care: Dressing to remain for now  Orthopedic device(s):None  CV/Blood loss:Hgb 7.7, continue to monitor  Pain management: Per critical care team  VTE prophylaxis: Would recommend starting Lovenox today if okay from critical care team  ID: Ancef for 24 hrs  Foley/Lines: Foley  Medical co-morbidities: Alcohol abuse/withdrawal per primary team  Dispo: PT/OT once extubated  Follow - up plan: TBD   Shona Needles, MD [facility] [phone] (office) [email or web address]

## 2020-05-26 NOTE — Progress Notes (Signed)
NAME:  [name], MRN:  [id number], DOB:  [birth year], LOS: 3 ADMISSION DATE:  05/23/2020, CONSULTATION DATE: 6/11 REFERRING MD: Dr. Maryan Rued, CHIEF COMPLAINT: Altered mental status  Brief History   39 yo male with hx of ETOH and seizure disordered presented after drinking binge and then alcohol withdrawal seizures.  Required intubation for airway protection.  Found to also have Lt hip fracture.  He has been non-compliant with his medications.  UDS positive for amphetamines.  Past Medical History  CVA, Seizure  Significant Hospital Events   6/11 Admit  6/12 PRBC transfusion 6/13 off LTM, to OR for repair of left hip fx  Consults:  Neurology Ortho  Procedures:  ETT 6/11 >  Significant Diagnostic Tests:   Xray L hip 6/11  > Comminuted intertrochanteric femur fracture on the left.  CT head / Cspine 6/11 > atrophy, C4-5 and C5-6 disc space narrowing and osteophyte formation  Echo 6/13 >> EF 45 - 50%, no obvious vegetations but poor windows  EEG 6/13 > mod to diffuse severe encephalopathy  Micro Data:  Blood 6/11 > GPC >>  Urine 6/11 > Staph haemolyticus  Blood 6/13 >>  Antimicrobials:  Cefepime 6/11 > 6/12 Vancomycin 6/11 >>  Interim history/subjective:  Slightly agitated on vent despite 50 propofol. Weaning on PSV 10/5 but mental status not to the point of extubation yet. Had surgical repair of left hip fx 6/13 and tolerated well  Objective   Blood pressure 99/82, pulse 93, temperature (!) 97.5 F (36.4 C), temperature source Axillary, resp. rate (!) 24, height 5\' 8"  (1.727 m), weight 86 kg, SpO2 100 %.    Vent Mode: CPAP;PSV FiO2 (%):  [30 %] 30 % Set Rate:  [18 bmp] 18 bmp Vt Set:  [540 mL] 540 mL PEEP:  [5 cmH20] 5 cmH20 Pressure Support:  [10 cmH20] 10 cmH20 Plateau Pressure:  [20 cmH20-23 cmH20] 23 cmH20   Intake/Output Summary (Last 24 hours) at 05/26/2020 0736 Last data filed at 05/26/2020 0600 Gross per 24 hour  Intake 4618.24 ml  Output 1450 ml    Net 3168.24 ml   Filed Weights   05/23/20 2000 05/25/20 0417 05/26/20 0500  Weight: 79.3 kg 86 kg 86 kg    Examination: General: Adult male, resting in bed, in NAD. Neuro: Sedated but agitated. HEENT: [place]/AT. Sclerae anicteric. ETT in place. Cardiovascular: RRR, no M/R/G.  Lungs: Respirations even and unlabored.  CTA bilaterally, No W/R/R. Abdomen: BS x 4, soft, NT/ND.  Musculoskeletal: Right BKA, no edema.  Skin: Intact, warm, no rashes.  Assessment & Plan:   Acute metabolic encephalopathy from alcohol withdrawal with alcohol withdrawal seizures. Methamphetamine intoxication. - continue keppra - thiamine, folic acid, MVI  Acute respiratory failure with hypoxia and compromised airway. - continue PSV wean and hopefully move towards extubation later this AM - f/u CXR intermittently  GPC in blood cultures - TTE neg for vegetations but did have poor windows.  No obvious murmur on exam, will defer TEE for now. - continue vancomycin - follow cultures  Left femur fracture - s/p surgical repair 6/13 (cephalomedullary nailing). - post op care per ortho  Anemia. Thrombocytopenia. - bid protonix - f/u CBC, iron levels - transfuse for Hb < 7 or significant bleeding  Best practice:  Diet: NPO DVT prophylaxis: SCDs GI prophylaxis: PPI Glucose control: SSI Mobility: BR Code Status: FULL Family Communication: None available.  Will attempt to call Disposition: ICU  Critical care time: 30 minutes    Montey Hora, [place] -  [facility] 05/26/2020, 7:44 AM

## 2020-05-26 NOTE — Progress Notes (Signed)
Troponin 129 - Dr. Chase Caller aware. EKG ordered.

## 2020-05-26 NOTE — Progress Notes (Signed)
Initial Nutrition Assessment  DOCUMENTATION CODES:   Not applicable  INTERVENTION:   Initiate tube feeding via OG tube: Vital AF 1.2 at 40 ml/h (960 ml per day) Pro-stat 30 ml TID  Provides 1452 kcal, 117 gm protein, 728 ml free water daily TF regimen and propofol at current rate providing 2112 total kcal/day   Monitor magnesium and phosphorus daily every 12 hours x 4 occurances, MD to replete as needed, as pt is at risk for refeeding syndrome given unknown nutrition hx/ETOH PTA.  NUTRITION DIAGNOSIS:   Increased nutrient needs related to post-op healing as evidenced by estimated needs.  GOAL:   Patient will meet greater than or equal to 90% of their needs  MONITOR:   TF tolerance, Vent status, Labs  REASON FOR ASSESSMENT:   Consult, Ventilator Enteral/tube feeding initiation and management  ASSESSMENT:   Pt with PMH of ETOH and sz disorder who is noncompliant with medications admitted after drinking binge then alcohol withdrawal seizures and fall at home.   Pt discussed during ICU rounds and with RN.  Per RN pt very agitated; no plans for extubation.   6/11 admit; xray showed L hip fx; positive for methaphetamines  6/13 s/p L hip fx repair  Patient is currently intubated on ventilator support MV: 14.8 L/min Temp (24hrs), Avg:98.7 F (37.1 C), Min:97.5 F (36.4 C), Max:100 F (37.8 C)  Propofol: 25 ml/hr provides: 660 kcal  Medications reviewed and include: folic acid, MVI, thiamine  Labs reviewed: troponin: 129 (H), K= 3.2 (L), TG: 210 (H), vitamin D 7    NUTRITION - FOCUSED PHYSICAL EXAM:    Most Recent Value  Orbital Region No depletion  Upper Arm Region No depletion  Thoracic and Lumbar Region No depletion  Buccal Region Unable to assess  Temple Region No depletion  Clavicle Bone Region No depletion  Clavicle and Acromion Bone Region No depletion  Scapular Bone Region Unable to assess  Dorsal Hand No depletion  Patellar Region No depletion   Anterior Thigh Region No depletion  Posterior Calf Region No depletion  Edema (RD Assessment) None  Hair Reviewed  Eyes Unable to assess  Mouth Unable to assess  Skin Reviewed  Nails Reviewed       Diet Order:   Diet Order            Diet NPO time specified  Diet effective now                 EDUCATION NEEDS:   No education needs have been identified at this time  Skin:  Skin Assessment: Reviewed RN Assessment  Last BM:  unknown  Height:   Ht Readings from Last 1 Encounters:  05/24/20 5\' 8"  (1.727 m)    Weight:   Wt Readings from Last 1 Encounters:  05/26/20 86 kg    Ideal Body Weight:  65.4 kg  BMI:  28.3  Estimated Nutritional Needs:   Kcal:  2172  Protein:  115-130 grams  Fluid:  2 L/day  Lockie Pares., RD, LDN, CNSC See AMiON for contact information

## 2020-05-27 ENCOUNTER — Encounter (HOSPITAL_COMMUNITY): Payer: Self-pay | Admitting: Student

## 2020-05-27 ENCOUNTER — Inpatient Hospital Stay (HOSPITAL_COMMUNITY): Payer: Medicaid Other

## 2020-05-27 LAB — GLUCOSE, CAPILLARY
Glucose-Capillary: 123 mg/dL — ABNORMAL HIGH (ref 70–99)
Glucose-Capillary: 135 mg/dL — ABNORMAL HIGH (ref 70–99)
Glucose-Capillary: 129 mg/dL — ABNORMAL HIGH (ref 70–99)
Glucose-Capillary: 127 mg/dL — ABNORMAL HIGH (ref 70–99)
Glucose-Capillary: 113 mg/dL — ABNORMAL HIGH (ref 70–99)

## 2020-05-27 LAB — CULTURE, BLOOD (ROUTINE X 2)
Special Requests: ADEQUATE
Special Requests: ADEQUATE

## 2020-05-27 LAB — BASIC METABOLIC PANEL
Anion gap: 8 (ref 5–15)
BUN: 10 mg/dL (ref 6–20)
CO2: 25 mmol/L (ref 22–32)
Calcium: 7.7 mg/dL — ABNORMAL LOW (ref 8.9–10.3)
Chloride: 101 mmol/L (ref 98–111)
Creatinine, Ser: 0.98 mg/dL (ref 0.61–1.24)
GFR calc Af Amer: >60 mL/min (ref >60)
GFR calc non Af Amer: >60 mL/min (ref >60)
Glucose, Bld: 129 mg/dL — ABNORMAL HIGH (ref 70–99)
Potassium: 3.2 mmol/L — ABNORMAL LOW (ref 3.5–5.1)
Sodium: 134 mmol/L — ABNORMAL LOW (ref 135–145)

## 2020-05-27 LAB — CBC
HCT: 24.3 % — ABNORMAL LOW (ref 39.0–52.0)
Hemoglobin: 7.4 g/dL — ABNORMAL LOW (ref 13.0–17.0)
MCH: 26.8 pg (ref 26.0–34.0)
MCHC: 30.5 g/dL (ref 30.0–36.0)
MCV: 88 fL (ref 80.0–100.0)
Platelets: 106 10*3/uL — ABNORMAL LOW (ref 150–400)
RBC: 2.76 MIL/uL — ABNORMAL LOW (ref 4.22–5.81)
RDW: 20.4 % — ABNORMAL HIGH (ref 11.5–15.5)
WBC: 10.8 10*3/uL — ABNORMAL HIGH (ref 4.0–10.5)
nRBC: 1.8 % — ABNORMAL HIGH (ref 0.0–0.2)

## 2020-05-27 LAB — MAGNESIUM
Magnesium: 2.2 mg/dL (ref 1.7–2.4)
Magnesium: 2.3 mg/dL (ref 1.7–2.4)

## 2020-05-27 LAB — PHOSPHORUS
Phosphorus: 2.3 mg/dL — ABNORMAL LOW (ref 2.5–4.6)
Phosphorus: 1.5 mg/dL — ABNORMAL LOW (ref 2.5–4.6)

## 2020-05-27 LAB — TROPONIN I (HIGH SENSITIVITY): Troponin I (High Sensitivity): 73 ng/L — ABNORMAL HIGH (ref <18)

## 2020-05-27 MED ORDER — VITAMIN D 25 MCG (1000 UNIT) PO TABS: 2000.0000 [IU] | ORAL_TABLET | Freq: Two times a day (BID) | ORAL | Status: DC

## 2020-05-27 MED ORDER — VITAMIN D 25 MCG (1000 UNIT) PO TABS
2000.0000 [IU] | ORAL_TABLET | Freq: Every day | ORAL | Status: DC
  Administered 2020-05-27 – 2020-06-07 (×9): 2000 [IU]
  Filled 2020-05-27 (×11): qty 2

## 2020-05-27 MED ORDER — ERGOCALCIFEROL 200 MCG/ML PO SOLN
50000.0000 [IU] | ORAL | Status: DC
  Administered 2020-05-27: 50000 [IU]
  Filled 2020-05-27 (×3): qty 6.25

## 2020-05-27 MED ORDER — POTASSIUM CHLORIDE 20 MEQ/15ML (10%) PO SOLN
40.0000 meq | Freq: Two times a day (BID) | ORAL | Status: AC
  Administered 2020-05-27 (×2): 40 meq
  Filled 2020-05-27: qty 30

## 2020-05-27 MED ORDER — VITAMIN D (ERGOCALCIFEROL) 1.25 MG (50000 UNIT) PO CAPS: 50000.0000 [IU] | ORAL_CAPSULE | ORAL | Status: DC

## 2020-05-27 MED ORDER — POTASSIUM CHLORIDE 20 MEQ/15ML (10%) PO SOLN
40.0000 meq | Freq: Two times a day (BID) | ORAL | Status: DC
  Filled 2020-05-27: qty 30

## 2020-05-27 NOTE — Progress Notes (Signed)
[facility] Progress Note Patient Name: [name] DOB: 1981-01-05 MRN: [id number]   Date of Service  05/27/2020  HPI/Events of Note  Patient has SCD's, however, no order for SCD's.  eICU Interventions  Plan: 1. Will order SCD's.     Intervention Category Major Interventions: Other:  Lysle Dingwall 05/27/2020, 5:35 AM

## 2020-05-27 NOTE — Progress Notes (Signed)
Orthopaedic Trauma Progress Note  S: No orthopedic issues.  Still intubated but following commands  O:  Vitals:   05/27/20 0729 05/27/20 0800  BP: 97/70   Pulse:    Resp: 17   Temp:  98.1 F (36.7 C)  SpO2:      General: Opens eyes to voice, follows commands, answers questions with nodding/shaking of head Left lower extremity: Dressings clean, dry, intact.  Swelling appropriate.  Denies any significant tenderness with palpation of the hip.  Endorses sensation to light touch distally.  Imaging: Stable post op imaging.   Labs:  Results for orders placed or performed during the hospital encounter of 05/23/20 (from the past 24 hour(s))  Troponin I (High Sensitivity)     Status: Abnormal   Collection Time: 05/26/20 10:48 AM  Result Value Ref Range   Troponin I (High Sensitivity) 129 (HH) <18 ng/L  Magnesium     Status: Abnormal   Collection Time: 05/26/20 10:48 AM  Result Value Ref Range   Magnesium 1.6 (L) 1.7 - 2.4 mg/dL  Phosphorus     Status: Abnormal   Collection Time: 05/26/20 10:48 AM  Result Value Ref Range   Phosphorus 1.9 (L) 2.5 - 4.6 mg/dL  Culture, respiratory (non-expectorated)     Status: None (Preliminary result)   Collection Time: 05/26/20 11:01 AM   Specimen: Tracheal Aspirate; Respiratory  Result Value Ref Range   Specimen Description TRACHEAL ASPIRATE    Special Requests NONE    Gram Stain      RARE WBC PRESENT, PREDOMINANTLY PMN RARE GRAM POSITIVE COCCI RARE GRAM POSITIVE RODS Performed at [facility], [place] [address], [place], [place] [address]    Culture PENDING    Report Status PENDING   Glucose, capillary     Status: Abnormal   Collection Time: 05/26/20 11:23 AM  Result Value Ref Range   Glucose-Capillary 115 (H) 70 - 99 mg/dL   Comment 1 Notify RN    Comment 2 Document in Chart   Glucose, capillary     Status: Abnormal   Collection Time: 05/26/20  3:50 PM  Result Value Ref Range   Glucose-Capillary 105 (H) 70 - 99 mg/dL   Comment 1  Notify RN    Comment 2 Document in Chart   Magnesium     Status: Abnormal   Collection Time: 05/26/20  5:02 PM  Result Value Ref Range   Magnesium 1.6 (L) 1.7 - 2.4 mg/dL  Phosphorus     Status: Abnormal   Collection Time: 05/26/20  5:02 PM  Result Value Ref Range   Phosphorus 1.5 (L) 2.5 - 4.6 mg/dL  Glucose, capillary     Status: Abnormal   Collection Time: 05/26/20  7:09 PM  Result Value Ref Range   Glucose-Capillary 114 (H) 70 - 99 mg/dL  Glucose, capillary     Status: Abnormal   Collection Time: 05/26/20 11:03 PM  Result Value Ref Range   Glucose-Capillary 124 (H) 70 - 99 mg/dL  Glucose, capillary     Status: Abnormal   Collection Time: 05/27/20  3:13 AM  Result Value Ref Range   Glucose-Capillary 123 (H) 70 - 99 mg/dL  Glucose, capillary     Status: Abnormal   Collection Time: 05/27/20  8:01 AM  Result Value Ref Range   Glucose-Capillary 135 (H) 70 - 99 mg/dL   Comment 1 Notify RN    Comment 2 Document in Chart     Assessment: 39 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  Continue to monitor  Pain management: Per trauma team  VTE prophylaxis: Lovenox once cleared by critical care team SCDs: Ordered, in place on RLE  ID:  Ancef 2gm x24 hours post op completed.  Remains on vancomycin for sepsis  Foley/Lines: Foley in place.  Continue IV fluids per critical care  Medical co-morbidities: alcohol abuse, seizures, thrombocytopenia, asthma, hypokalemia, history of mental retardation, history of left BKA  Impediments to Fracture Healing: Vitamin D level 7, start vitamin D2 and D3 supplementation once extubated and tolerating PO  meds  Dispo: PT/OT evaluation once extubated and able  Follow - up plan: TBD  Contact information:  Katha Hamming MD, Patrecia Pace PA-C   [facility]t A. [facility] [phone] (office) [email or web address]

## 2020-05-27 NOTE — Progress Notes (Addendum)
NAME:  [name], MRN:  [id number], DOB:  Apr 06, 1981, LOS: 4 ADMISSION DATE:  05/23/2020, CONSULTATION DATE: 6/11 REFERRING MD: Dr. Maryan Rued, CHIEF COMPLAINT: Altered mental status  Brief History   39 yo male with hx of ETOH and seizure disordered presented after drinking binge and then alcohol withdrawal seizures.  Required intubation for airway protection.  Found to also have Lt hip fracture.  He has been non-compliant with his medications.  UDS positive for amphetamines.  Past Medical History  CVA, Seizure  Significant Hospital Events   6/11 Admit  6/12 PRBC transfusion 6/13 off LTM, to OR for repair of left hip fx  Consults:  Neurology Ortho  Procedures:  ETT 6/11 >  Significant Diagnostic Tests:   Xray L hip 6/11  > Comminuted intertrochanteric femur fracture on the left.  CT head / Cspine 6/11 > atrophy, C4-5 and C5-6 disc space narrowing and osteophyte formation  Echo 6/13 >> EF 45 - 50%, no obvious vegetations but poor windows, consider TEE  EEG 6/13 > mod to diffuse severe encephalopathy  Micro Data:  Blood 6/11 > GPC >>  Urine 6/11 > Staph haemolyticus  Blood 6/13 >>  Antimicrobials:  Cefepime 6/11 > 6/12 Vancomycin 6/11 >>  Interim history/subjective:  Remains  agitated on vent despite 40 propofol. Weaning on PSV 10/5, follows some commands but mental status is questionable for  extubation. Had surgical repair of left hip fx 6/13 and tolerated well + 5.3 L T Max 100.3 CXR with R atelectasis 6/14 Labs are pending this am Trial to convert from propofol to precedex was a fail. Back on Propofol 6/15 HGB 7.4 Platelets 106 WBC is 10.8  Objective   Blood pressure 97/70, pulse 76, temperature 98.1 F (36.7 C), resp. rate 17, height 5\' 8"  (1.727 m), weight 86 kg, SpO2 100 %.    Vent Mode: CPAP;PSV FiO2 (%):  [30 %] 30 % Set Rate:  [18 bmp] 18 bmp Vt Set:  [540 mL] 540 mL PEEP:  [5 cmH20] 5 cmH20 Pressure Support:  [10 cmH20] 10 cmH20 Plateau  Pressure:  [21 cmH20] 21 cmH20   Intake/Output Summary (Last 24 hours) at 05/27/2020 0814 Last data filed at 05/27/2020 0600 Gross per 24 hour  Intake 2493.18 ml  Output 1975 ml  Net 518.18 ml   Filed Weights   05/25/20 0417 05/26/20 0500 05/27/20 0500  Weight: 86 kg 86 kg 86 kg    Examination: General: Adult male, resting in bed,agitated, in NAD Neuro: Sedated but agitated.MAE x 4, follows commands HEENT: [place]/AT. Sclerae anicteric. ETT in place and secure, OG tube. Cardiovascular: S1, S2, RRR, no M/R/G, NSR per tele.  Lungs: Bi;ateral chest excursion, Respirations even and unlabored.  CTA bilaterally, No W/R/R. Abdomen: BS x 4, soft, NT/ND.  Musculoskeletal: Right BKA, no edema.  Skin: Intact, warm, no rashes.  Assessment & Plan:   Acute metabolic encephalopathy from alcohol withdrawal with alcohol withdrawal seizures. Methamphetamine intoxication. - continue keppra - thiamine, folic acid, MVI - Monitor for seizures  Acute respiratory failure with hypoxia and compromised airway. - continue PSV wean and hopefully move towards extubation later once mental status and secretions allow - f/u CXR intermittently  GPC in blood cultures - TTE neg for vegetations but did have poor windows.  No obvious murmur on exam, will defer TEE for now. Low grade temp - continue vancomycin, plan is for 7 days of treatment - follow cultures , WBC and fever curve  Left femur fracture - s/p surgical repair  6/13 (cephalomedullary nailing). - post op care per ortho  Anemia - baseline normal  hgb 2018. New onset at admission 8.3gm%. Likely due to trauma v marrow suppression  Thrombocytopenia  - ? Chronic, ? Cause  ? Marrow supression  Recent Labs  Lab 05/25/20 0925 05/26/20 0704 05/27/20 0741  HGB 7.7* 7.2* 7.4*  HCT 24.6* 24.1* 24.3*  WBC 9.7 9.7 10.8*  PLT 88* 83* 106*    Plan - bid protonix - f/u CBC, iron levels - transfuse for Hb < 7 or significant bleeding - No heparin - get  RUQ [place] to eval for cirrhosis  Hypokalemia Plan Replete K  Best practice:  Diet: NPO, TF DVT prophylaxis: SCDs GI prophylaxis: PPI Glucose control: SSI Mobility: BR Code Status: FULL Family Communication: None available.  Will attempt to call Disposition: ICU  Critical care time: 25 minutes    Magdalen Spatz, MSN, AGACNP-BC [place] for personal pager and assignments PCCM on call pager [phone] 05/27/2020, 8:14 AM    Dr Chase Caller updaed sister [name] - 6:45 PM 05/27/2020     SIGNATURE    Dr. Brand Males, M.D., F.C.C.P,  Pulmonary and Critical Care Medicine Staff Physician, [place] Director - Interstitial Lung Disease  Program  Pulmonary [place] [facility] [place], [place], [address]  Pager: [phone], If no answer or between  15:00h - 7:00h: call [phone] Telephone: [phone]  6:45 PM 05/27/2020

## 2020-05-28 ENCOUNTER — Inpatient Hospital Stay (HOSPITAL_COMMUNITY): Payer: Medicaid Other

## 2020-05-28 ENCOUNTER — Inpatient Hospital Stay: Payer: Self-pay

## 2020-05-28 LAB — URINALYSIS, ROUTINE W REFLEX MICROSCOPIC
Bilirubin Urine: NEGATIVE
Glucose, UA: NEGATIVE mg/dL
Hgb urine dipstick: NEGATIVE
Ketones, ur: NEGATIVE mg/dL
Leukocytes,Ua: NEGATIVE
Nitrite: NEGATIVE
Protein, ur: NEGATIVE mg/dL
Specific Gravity, Urine: <1.005 — ABNORMAL LOW (ref 1.005–1.030)
pH: 6 (ref 5.0–8.0)

## 2020-05-28 LAB — COMPREHENSIVE METABOLIC PANEL
ALT: 30 U/L (ref 0–44)
ALT: 32 U/L (ref 0–44)
AST: 103 U/L — ABNORMAL HIGH (ref 15–41)
AST: 101 U/L — ABNORMAL HIGH (ref 15–41)
Albumin: 1.9 g/dL — ABNORMAL LOW (ref 3.5–5.0)
Albumin: 2 g/dL — ABNORMAL LOW (ref 3.5–5.0)
Alkaline Phosphatase: 130 U/L — ABNORMAL HIGH (ref 38–126)
Alkaline Phosphatase: 137 U/L — ABNORMAL HIGH (ref 38–126)
Anion gap: 7 (ref 5–15)
Anion gap: 8 (ref 5–15)
BUN: 13 mg/dL (ref 6–20)
BUN: 12 mg/dL (ref 6–20)
CO2: 24 mmol/L (ref 22–32)
CO2: 26 mmol/L (ref 22–32)
Calcium: 7.8 mg/dL — ABNORMAL LOW (ref 8.9–10.3)
Calcium: 7.9 mg/dL — ABNORMAL LOW (ref 8.9–10.3)
Chloride: 103 mmol/L (ref 98–111)
Chloride: 101 mmol/L (ref 98–111)
Creatinine, Ser: 0.93 mg/dL (ref 0.61–1.24)
Creatinine, Ser: 0.85 mg/dL (ref 0.61–1.24)
GFR calc Af Amer: >60 mL/min (ref >60)
GFR calc Af Amer: >60 mL/min (ref >60)
GFR calc non Af Amer: >60 mL/min (ref >60)
GFR calc non Af Amer: >60 mL/min (ref >60)
Glucose, Bld: 101 mg/dL — ABNORMAL HIGH (ref 70–99)
Glucose, Bld: 130 mg/dL — ABNORMAL HIGH (ref 70–99)
Potassium: 3.2 mmol/L — ABNORMAL LOW (ref 3.5–5.1)
Potassium: 3.5 mmol/L (ref 3.5–5.1)
Sodium: 134 mmol/L — ABNORMAL LOW (ref 135–145)
Sodium: 135 mmol/L (ref 135–145)
Total Bilirubin: 2.4 mg/dL — ABNORMAL HIGH (ref 0.3–1.2)
Total Bilirubin: 2.1 mg/dL — ABNORMAL HIGH (ref 0.3–1.2)
Total Protein: 5.7 g/dL — ABNORMAL LOW (ref 6.5–8.1)
Total Protein: 5.9 g/dL — ABNORMAL LOW (ref 6.5–8.1)

## 2020-05-28 LAB — CBC
HCT: 25.6 % — ABNORMAL LOW (ref 39.0–52.0)
HCT: 24.6 % — ABNORMAL LOW (ref 39.0–52.0)
Hemoglobin: 8 g/dL — ABNORMAL LOW (ref 13.0–17.0)
Hemoglobin: 7.6 g/dL — ABNORMAL LOW (ref 13.0–17.0)
MCH: 27.8 pg (ref 26.0–34.0)
MCH: 26.8 pg (ref 26.0–34.0)
MCHC: 31.3 g/dL (ref 30.0–36.0)
MCHC: 30.9 g/dL (ref 30.0–36.0)
MCV: 88.9 fL (ref 80.0–100.0)
MCV: 86.6 fL (ref 80.0–100.0)
Platelets: 127 10*3/uL — ABNORMAL LOW (ref 150–400)
Platelets: 149 10*3/uL — ABNORMAL LOW (ref 150–400)
RBC: 2.88 MIL/uL — ABNORMAL LOW (ref 4.22–5.81)
RBC: 2.84 MIL/uL — ABNORMAL LOW (ref 4.22–5.81)
RDW: 21 % — ABNORMAL HIGH (ref 11.5–15.5)
RDW: 20.3 % — ABNORMAL HIGH (ref 11.5–15.5)
WBC: 10.4 10*3/uL (ref 4.0–10.5)
WBC: 10.2 10*3/uL (ref 4.0–10.5)
nRBC: 1.2 % — ABNORMAL HIGH (ref 0.0–0.2)
nRBC: 1 % — ABNORMAL HIGH (ref 0.0–0.2)

## 2020-05-28 LAB — GLUCOSE, CAPILLARY
Glucose-Capillary: 91 mg/dL (ref 70–99)
Glucose-Capillary: 113 mg/dL — ABNORMAL HIGH (ref 70–99)
Glucose-Capillary: 134 mg/dL — ABNORMAL HIGH (ref 70–99)
Glucose-Capillary: 122 mg/dL — ABNORMAL HIGH (ref 70–99)
Glucose-Capillary: 133 mg/dL — ABNORMAL HIGH (ref 70–99)
Glucose-Capillary: 115 mg/dL — ABNORMAL HIGH (ref 70–99)

## 2020-05-28 LAB — BRAIN NATRIURETIC PEPTIDE: B Natriuretic Peptide: 208.6 pg/mL — ABNORMAL HIGH (ref 0.0–100.0)

## 2020-05-28 LAB — CULTURE, RESPIRATORY W GRAM STAIN: Culture: NORMAL

## 2020-05-28 LAB — PHOSPHORUS
Phosphorus: 1.4 mg/dL — ABNORMAL LOW (ref 2.5–4.6)
Phosphorus: 1.4 mg/dL — ABNORMAL LOW (ref 2.5–4.6)

## 2020-05-28 LAB — TROPONIN I (HIGH SENSITIVITY)
Troponin I (High Sensitivity): 50 ng/L — ABNORMAL HIGH (ref <18)
Troponin I (High Sensitivity): 49 ng/L — ABNORMAL HIGH (ref <18)

## 2020-05-28 LAB — PROCALCITONIN
Procalcitonin: UNDETERMINED ng/mL
Procalcitonin: 0.91 ng/mL

## 2020-05-28 LAB — HEPARIN INDUCED PLATELET AB (HIT ANTIBODY): Heparin Induced Plt Ab: UNDETERMINED

## 2020-05-28 LAB — MAGNESIUM
Magnesium: 2 mg/dL (ref 1.7–2.4)
Magnesium: 1.7 mg/dL (ref 1.7–2.4)

## 2020-05-28 MED ORDER — POTASSIUM PHOSPHATES 15 MMOLE/5ML IV SOLN
45.0000 mmol | Freq: Once | INTRAVENOUS | Status: AC
  Administered 2020-05-28: 45 mmol via INTRAVENOUS
  Filled 2020-05-28: qty 15

## 2020-05-28 MED ORDER — SODIUM CHLORIDE 0.9% FLUSH: 10.0000 mL | INTRAVENOUS | Status: DC | PRN

## 2020-05-28 MED ORDER — SODIUM CHLORIDE 0.9% FLUSH
10.0000 mL | Freq: Two times a day (BID) | INTRAVENOUS | Status: DC
  Administered 2020-05-28: 10 mL
  Administered 2020-05-28 – 2020-05-29 (×2): 20 mL
  Administered 2020-05-29: 40 mL
  Administered 2020-05-30: 10 mL
  Administered 2020-05-30 – 2020-05-31 (×2): 20 mL
  Administered 2020-06-01 – 2020-06-07 (×10): 10 mL

## 2020-05-28 MED ORDER — ACETAMINOPHEN 160 MG/5ML PO SOLN
650.0000 mg | Freq: Four times a day (QID) | ORAL | Status: DC | PRN
  Administered 2020-05-28 – 2020-06-04 (×3): 650 mg
  Filled 2020-05-28 (×4): qty 20.3

## 2020-05-28 MED ORDER — POTASSIUM CHLORIDE 20 MEQ/15ML (10%) PO SOLN
40.0000 meq | Freq: Once | ORAL | Status: AC
  Administered 2020-05-28: 40 meq
  Filled 2020-05-28: qty 30

## 2020-05-28 MED ORDER — MAGNESIUM SULFATE 2 GM/50ML IV SOLN
2.0000 g | Freq: Once | INTRAVENOUS | Status: AC
  Administered 2020-05-28: 2 g via INTRAVENOUS
  Filled 2020-05-28: qty 50

## 2020-05-28 MED ORDER — POTASSIUM PHOSPHATES 15 MMOLE/5ML IV SOLN
24.0000 mmol | Freq: Once | INTRAVENOUS | Status: DC
  Filled 2020-05-28: qty 8

## 2020-05-28 MED ORDER — SODIUM CHLORIDE 0.9 % IV SOLN
2.0000 g | Freq: Three times a day (TID) | INTRAVENOUS | Status: DC
  Administered 2020-05-28 – 2020-06-02 (×17): 2 g via INTRAVENOUS
  Filled 2020-05-28 (×21): qty 2

## 2020-05-28 MED ORDER — FUROSEMIDE 10 MG/ML IJ SOLN
40.0000 mg | Freq: Once | INTRAMUSCULAR | Status: AC
  Administered 2020-05-28: 40 mg via INTRAVENOUS
  Filled 2020-05-28: qty 4

## 2020-05-28 NOTE — Progress Notes (Signed)
Peripherally Inserted Central Catheter Placement  The IV Nurse has discussed with the patient and/or persons authorized to consent for the patient, the purpose of this procedure and the potential benefits and risks involved with this procedure.  The benefits include less needle sticks, lab draws from the catheter, and the patient may be discharged home with the catheter. Risks include, but not limited to, infection, bleeding, blood clot (thrombus formation), and puncture of an artery; nerve damage and irregular heartbeat and possibility to perform a PICC exchange if needed/ordered by physician.  Alternatives to this procedure were also discussed.  Bard Power PICC patient education guide, fact sheet on infection prevention and patient information card has been provided to patient /or left at bedside.   Consent obtained with sister via telephone   PICC Placement Documentation  PICC Double Lumen 32/99/24 PICC Right Basilic 41 cm 0 cm (Active)  Indication for Insertion or Continuance of Line Poor Vasculature-patient has had multiple peripheral attempts or PIVs lasting less than 24 hours 05/28/20 1200  Exposed Catheter (cm) 0 cm 05/28/20 1200  Site Assessment Clean;Dry;Intact 05/28/20 1200  Lumen #1 Status Flushed;Saline locked;Blood return noted 05/28/20 1200  Lumen #2 Status Flushed;Saline locked;Blood return noted 05/28/20 1200  Dressing Type Transparent;Securing device 05/28/20 1200  Dressing Status Clean;Dry;Intact;Antimicrobial disc in place 05/28/20 1200  Dressing Change Due 06/04/20 05/28/20 1200       Holley Bouche Renee 05/28/2020, 12:47 PM

## 2020-05-28 NOTE — Progress Notes (Signed)
Pharmacy Antibiotic Note  [name] is a 39 y.o. male admitted on 05/23/2020 with sepsis.  Pharmacy has been consulted for vancomycin and cefepime dosing.  Of note, patient is growing 2 different strains of coag negative staph in each set of blood cultures which could represent a contaminant. WBC has normalized. Afebrile. A vancomycin trough drawn on 6/14 was therapeutic at 16.    With continued elevated temp and WBC 10.4, antibiotics have been escalated to add cefepime. Cultures have been resent.   Plan: Continue vancomycin 1 gm IV Q 12 hours Add cefepime 2 g IV q8h  Monitor clinical course, repeat cultures, LOT   Temp (24hrs), Avg:100.2 F (37.9 C), Min:99.7 F (37.6 C), Max:100.8 F (38.2 C)  Recent Labs  Lab 05/23/20 1513 05/23/20 1513 05/23/20 1736 05/24/20 0702 05/24/20 0702 05/24/20 2224 05/25/20 0925 05/26/20 0704 05/27/20 0741 05/28/20 0643  WBC 13.3*   < >  --  11.8*   < > 12.2* 9.7 9.7 10.8* 10.4  CREATININE 1.49*   < >  --  1.31*  --   --  1.18 1.04 0.98 0.93  LATICACIDVEN 8.3*  --  9.5*  --   --  1.8  --   --   --   --   VANCOTROUGH  --   --   --   --   --   --   --  16  --   --    < > = values in this interval not displayed.    Estimated Creatinine Clearance: 112.6 mL/min (by C-G formula based on SCr of 0.93 mg/dL).    No Known Allergies  Antimicrobials this admission: Cefepime 6/11 >> 6/12, 6/16 >> vanco 6/11 >>   Dose adjustments this admission: N/A  Microbiology results: 6/16 TA: sent 6/16 BCx: sent 6/16 UCx: sent 6/14 TA: normal flora 6/11 BCx: 1 set staph hominis; 1 set staph haemolyticus 6/11 UCx: (in/out cath): 5k col staph haemolyticus 6/11 mrsa pcr - neg   Thank you for allowing pharmacy to be a part of this patient's care.   Vertis Kelch, PharmD, [facility] PGY2 Cardiology Pharmacy Resident Phone [phone] 05/28/2020       8:45 AM  Please check [email or web address] for unit-specific pharmacist phone numbers

## 2020-05-28 NOTE — Progress Notes (Signed)
This note also relates to the following rows which could not be included: SpO2 - Cannot attach notes to unvalidated device data  RT NOTE: ETT advanced 2cm to 27 at the lips per MD order. Vitals are stable. RT will continue to monitor.

## 2020-05-28 NOTE — Progress Notes (Addendum)
[facility] Progress Note Patient Name: [name] DOB: 21-Jan-1981 MRN: [id number]   Date of Service  05/28/2020  HPI/Events of Note  CT Scan of sinuses: Severe mucosal thickening is noted in right maxillary sinus consistent with probable chronic sinusitis. Air-fluid level is noted in left sphenoid sinus consistent with acute sinusitis. Patient is already on Vancomycin and Cefepime which should cover sinus infection.  eICU Interventions  Plan:  1. Continue present management.      Intervention Category Major Interventions: Other:  Lysle Dingwall 05/28/2020, 11:13 PM

## 2020-05-28 NOTE — Progress Notes (Signed)
Pt's sister Dermot Gremillion at bedside, expresses concern that pt gown has frequently been wet from saliva, and concerned that his vent has "popped off". Pt cleaned up. I assured the visitor that we were trying to keep him clean, and when we realize he is wet we change him, and that his agitation is contributing to his vent "popping off", IV falling out, coughing, oral secretions etc. She demanded to speak with the charge nurse. Charge RN Sarah and Department Director Shanon Brow came to speak with her, as well as Dr Annie Sable.  Different RN assigned to pt.

## 2020-05-28 NOTE — TOC CAGE-AID Note (Signed)
Transition of Care [facility]) - CAGE-AID Screening   Patient Details  Name: [name] MRN: [id number] Date of Birth: 1981-09-04  Transition of Care [facility]) CM/SW Contact:    Emeterio Reeve, [place] Phone Number: 05/28/2020, 9:07 AM   Clinical Narrative:  Pt unable to participate in assessment due to being on vent.  CAGE-AID Screening: Substance Abuse Screening unable to be completed due to: : Patient unable to participate                  Providence [place] Clinical Social Worker [phone]

## 2020-05-28 NOTE — Progress Notes (Signed)
Afternoon lab review  -Liver function test improving.:  Can give Tylenol because of fever -Anemia appears stable -Potassium, magnesium and phosphorus are low   Plan -Await CT scan sinus results -Replete potassium magnesium and phosphorus    LABS    PULMONARY Recent Labs  Lab 05/23/20 2009 05/24/20 0349  PHART 7.382 7.490*  PCO2ART 39.6 32.7  PO2ART 354* 156*  HCO3 23.5 25.0  TCO2 25 26  O2SAT 100.0 100.0    CBC Recent Labs  Lab 05/27/20 0741 05/28/20 0643 05/28/20 1337  HGB 7.4* 8.0* 7.6*  HCT 24.3* 25.6* 24.6*  WBC 10.8* 10.4 10.2  PLT 106* 127* 149*    COAGULATION Recent Labs  Lab 05/23/20 1513  INR 1.4*    CARDIAC  No results for input(s): TROPONINI in the last 168 hours. No results for input(s): PROBNP in the last 168 hours.   CHEMISTRY Recent Labs  Lab 05/25/20 0925 05/25/20 0925 05/26/20 0704 05/26/20 0704 05/26/20 1048 05/26/20 1702 05/27/20 0741 05/27/20 0741 05/27/20 1626 05/28/20 0643 05/28/20 1337  NA 132*  --  134*  --   --   --  134*  --   --  134* 135  K 3.0*   < > 3.2*   < >  --   --  3.2*   < >  --  3.2* 3.5  CL 99  --  104  --   --   --  101  --   --  103 101  CO2 25  --  23  --   --   --  25  --   --  24 26  GLUCOSE 110*  --  173*  --   --   --  129*  --   --  101* 130*  BUN 14  --  9  --   --   --  10  --   --  13 12  CREATININE 1.18  --  1.04  --   --   --  0.98  --   --  0.93 0.85  CALCIUM 7.8*  --  7.5*  --   --   --  7.7*  --   --  7.8* 7.9*  MG  --   --   --   --    < > 1.6* 2.2  --  2.3 2.0 1.7  PHOS  --   --   --   --    < > 1.5* 2.3*  --  1.5* 1.4* 1.4*   < > = values in this interval not displayed.   Estimated Creatinine Clearance: 123.2 mL/min (by C-G formula based on SCr of 0.85 mg/dL).   LIVER Recent Labs  Lab 05/23/20 1513 05/28/20 0643 05/28/20 1337  AST 141* 103* 101*  ALT 34 30 32  ALKPHOS 178* 130* 137*  BILITOT 3.4* 2.4* 2.1*  PROT 7.0 5.7* 5.9*  ALBUMIN 2.6* 1.9* 2.0*  INR 1.4*  --    --      INFECTIOUS Recent Labs  Lab 05/23/20 1513 05/23/20 1736 05/24/20 2224 05/28/20 0906 05/28/20 1337  LATICACIDVEN 8.3* 9.5* 1.8  --   --   PROCALCITON  --   --   --  SPECIMEN CONTAMINATED, UNABLE TO PERFORM TEST(S). 0.91     ENDOCRINE CBG (last 3)  Recent Labs    05/28/20 0732 05/28/20 1258 05/28/20 1508  GLUCAP 113* 134* 122*         IMAGING x48h  - image(s) personally  visualized  -   highlighted in bold DG CHEST PORT 1 VIEW  Result Date: 05/28/2020 CLINICAL DATA:  Respiratory failure. EXAM: PORTABLE CHEST 1 VIEW COMPARISON:  05/26/2020 FINDINGS: ET tube tip is above the carina. Nasogastric tube tip projects over the fundus of the stomach. The side port is at the level of the GE junction. Stable cardiac enlargement and pulmonary venous congestion. No pleural effusion or airspace consolidation. IMPRESSION: 1. Stable cardiac enlargement and pulmonary venous congestion. Electronically Signed   By: Kerby Moors M.D.   On: 05/28/2020 08:29   [place] EKG SITE RITE  Result Date: 05/28/2020 If Site Rite image not attached, placement could not be confirmed due to current cardiac rhythm.  [place] EKG SITE RITE  Result Date: 05/28/2020 If Site Rite image not attached, placement could not be confirmed due to current cardiac rhythm.  US Abdomen Limited RUQ  Result Date: 05/27/2020 CLINICAL DATA:  Cirrhosis EXAM: ULTRASOUND ABDOMEN LIMITED RIGHT UPPER QUADRANT COMPARISON:  June 05, 2016 FINDINGS: Gallbladder: Small amount of possible layering sludge is seen. No gallbladder wall thickening. No sonographic Murphy sign noted by sonographer. Common bile duct: Diameter: 5 mm Liver: Increased echotexture seen throughout. No focal abnormality or biliary ductal dilatation. Portal vein is patent on color Doppler imaging with normal direction of blood flow towards the liver. Other: None. IMPRESSION: Gallbladder sludge, no evidence of acute cholecystitis. Hepatic steatosis. Electronically Signed    By: Prudencio Pair M.D.   On: 05/27/2020 22:07

## 2020-05-28 NOTE — Progress Notes (Signed)
RT NOTE: RT transported patient on ventilator from room 4N16 to CT and back to room 1U40 with no complications. Vitals are stable. RT will continue to monitor.

## 2020-05-28 NOTE — Progress Notes (Signed)
NAME:  [name], MRN:  [id number], DOB:  [birth year], LOS: 5 ADMISSION DATE:  05/23/2020, CONSULTATION DATE: 6/11 REFERRING MD: Dr. Maryan Rued, CHIEF COMPLAINT: Altered mental status  Brief History   39 yo male with hx of ETOH and seizure disordered presented after drinking binge and then alcohol withdrawal seizures.  Required intubation for airway protection.  Found to also have Lt hip fracture.  He has been non-compliant with his medications.  UDS positive for amphetamines.  Past Medical History  CVA, Seizure  Significant Hospital Events   6/11 Admit  6/12 PRBC transfusion 6/13 off LTM, to OR for repair of left hip fx   6/16 - Remains  agitated on vent despite 40 propofol. Weaning on PSV 10/5, follows some commands but mental status is questionable for  extubation. Had surgical repair of left hip fx 6/13 and tolerated well + 5.3 L T Max 100.3 CXR with R atelectasis 6/14 Labs are pending this am Trial to convert from propofol to precedex was a fail. Back on Propofol 6/15 HGB 7.4 Platelets 106 WBC is 10.8  Consults:  Neurology Ortho  Procedures:  ETT 6/11 >  Significant Diagnostic Tests:   Xray L hip 6/11  > Comminuted intertrochanteric femur fracture on the left.  CT head / Cspine 6/11 > atrophy, C4-5 and C5-6 disc space narrowing and osteophyte formation  Echo 6/13 >> EF 45 - 50%, no obvious vegetations but poor windows, consider TEE  EEG 6/13 > mod to diffuse severe encephalopathy  Micro Data:  Blood 6/11 > GPC >>  MRSA PRCR and MSSA PCR - 6/13 - neg Urine 6/11 > Staph haemolyticus and hominis Blood 6/13 >>  xxxxxxxxxxxxx Trach aspirate 6/14 >>normal flora [id number] Trach Blood culture urine  Antimicrobials:  Cefepime 6/11 > 6/12 Ceftriaxone 6/11 >6/11 Vancomycin 6/11 >> (6/18) Cefazolin 6/14 -614   Inpatient Med Profile   Scheduled Current Meds   Current Facility-Administered Medications:  .  0.9 %  sodium chloride infusion (Manually  program via Guardrails IV Fluids), , Intravenous, Once, Patrecia Pace A, PA-C .  0.9 %  sodium chloride infusion, , Intravenous, PRN, Brand Males, MD, Stopped at 05/27/20 0021 .  chlorhexidine gluconate (MEDLINE KIT) (PERIDEX) 0.12 % solution 15 mL, 15 mL, Mouth Rinse, BID, Patrecia Pace A, PA-C, 15 mL at 05/28/20 0741 .  Chlorhexidine Gluconate Cloth 2 % PADS 6 each, 6 each, Topical, Daily, Delray Alt, PA-C, 6 each at 05/25/20 2230 .  cholecalciferol (VITAMIN D3) tablet 2,000 Units, 2,000 Units, Per Tube, Daily, Delray Alt, PA-C, 2,000 Units at 05/27/20 0943 .  docusate (COLACE) 50 MG/5ML liquid 100 mg, 100 mg, Per Tube, BID PRN, Patrecia Pace A, PA-C .  ergocalciferol (DRISDOL) 200 MCG/ML drops 50,000 Units, 50,000 Units, Per Tube, Q7 days, Delray Alt, PA-C, 50,000 Units at 05/27/20 1653 .  feeding supplement (PRO-STAT SUGAR FREE 64) liquid 30 mL, 30 mL, Per Tube, TID, Brand Males, MD, 30 mL at 05/27/20 2300 .  feeding supplement (VITAL AF 1.2 CAL) liquid 1,000 mL, 1,000 mL, Per Tube, Continuous, Johnnie Goynes, MD, Last Rate: 40 mL/hr at 05/28/20 0730, 1,000 mL at 05/28/20 0730 .  fentaNYL (SUBLIMAZE) injection 50-200 mcg, 50-200 mcg, Intravenous, Q30 min PRN, Delray Alt, PA-C, 100 mcg at 05/28/20 0318 .  folic acid (FOLVITE) tablet 1 mg, 1 mg, Per Tube, Daily, Delray Alt, PA-C, 1 mg at 05/27/20 0943 .  levETIRAcetam (KEPPRA) 750 mg in sodium chloride 0.9 % 100 mL IVPB, 750 mg,  Intravenous, Q12H, Delray Alt, PA-C, Stopped at 05/27/20 2026 .  MEDLINE mouth rinse, 15 mL, Mouth Rinse, 10 times per day, Delray Alt, PA-C, 15 mL at 05/28/20 0547 .  midazolam (VERSED) injection 2 mg, 2 mg, Intravenous, Q2H PRN, Delray Alt, PA-C, 2 mg at 05/27/20 1343 .  multivitamin with minerals tablet 1 tablet, 1 tablet, Per Tube, Daily, Delray Alt, PA-C, 1 tablet at 05/27/20 0944 .  mupirocin ointment (BACTROBAN) 2 % 1 application, 1 application, Nasal, BID,  Delray Alt, PA-C, 1 application at 88/41/66 2300 .  pantoprazole (PROTONIX) injection 40 mg, 40 mg, Intravenous, Q12H, Patrecia Pace A, PA-C, 40 mg at 05/27/20 2300 .  polyethylene glycol (MIRALAX / GLYCOLAX) packet 17 g, 17 g, Oral, Daily PRN, Patrecia Pace A, PA-C .  propofol (DIPRIVAN) 1000 MG/100ML infusion, 0-50 mcg/kg/min, Intravenous, Continuous, Kieanna Rollo, MD, Last Rate: 25.8 mL/hr at 05/28/20 0746, 50 mcg/kg/min at 05/28/20 0746 .  thiamine tablet 100 mg, 100 mg, Per Tube, Daily, Delray Alt, PA-C, 100 mg at 05/27/20 0944 .  vancomycin (VANCOCIN) IVPB 1000 mg/200 mL premix, 1,000 mg, Intravenous, Q12H, Magdalen Spatz, NP, Stopped at 05/27/20 2107   Infusions  . sodium chloride Stopped (05/27/20 0021)  . feeding supplement (VITAL AF 1.2 CAL) 1,000 mL (05/28/20 0730)  . levETIRAcetam Stopped (05/27/20 2026)  . propofol (DIPRIVAN) infusion 50 mcg/kg/min (05/28/20 0746)  . vancomycin Stopped (05/27/20 2107)     PRN Meds  sodium chloride, docusate, fentaNYL (SUBLIMAZE) injection, midazolam, polyethylene glycol  Discontinued Meds  Medications Discontinued During This Encounter  Medication Reason  . LORazepam (ATIVAN) tablet 0.5 mg   . piperacillin-tazobactam (ZOSYN) IVPB 3.375 g   . cefTRIAXone (ROCEPHIN) 2 g in sodium chloride 0.9 % 100 mL IVPB   . dextrose 50 % solution 50 mL   . dextrose 50 % solution Returned to ADS  . dexmedetomidine (PRECEDEX) 400 MCG/100ML (4 mcg/mL) infusion   . ceFEPIme (MAXIPIME) 2 g in sodium chloride 0.9 % 100 mL IVPB   . docusate (COLACE) 50 MG/5ML liquid 100 mg   . polyethylene glycol (MIRALAX / GLYCOLAX) packet 17 g   . ceFEPIme (MAXIPIME) 2 g in sodium chloride 0.9 % 100 mL IVPB   . fentaNYL (SUBLIMAZE) injection 50 mcg   . docusate sodium (COLACE) capsule 100 mg   . heparin injection 5,000 Units   . polyethylene glycol (MIRALAX / GLYCOLAX) packet 17 g   . pantoprazole (PROTONIX) injection 40 mg   . docusate (COLACE) 50  MG/5ML liquid 100 mg   . levETIRAcetam (KEPPRA) IVPB 1000 mg/100 mL premix   . 0.9 % irrigation (POUR BTL) Patient Transfer  . vancomycin (VANCOCIN) powder Patient Transfer  . propofol (DIPRIVAN) 1000 MG/100ML infusion   . dextrose 5 %-0.9 % sodium chloride infusion   . dexmedetomidine (PRECEDEX) 400 MCG/100ML (4 mcg/mL) infusion   . cholecalciferol (VITAMIN D3) tablet 2,000 Units   . Vitamin D (Ergocalciferol) (DRISDOL) capsule 50,000 Units   . potassium chloride 20 MEQ/15ML (10%) solution 40 mEq       Interim history/subjective:    05/28/2020 = still febrile . Tmax 101F. WBC 10.8K. Plat better and > 100. Hgb stable at 7.4g%. AKI resolved 05/25/20. Marland Kitchen ECHO 6/13 with low EF 45% and global hypokinessi. Phois low at 1.5 . [place] without cirrhosis but some gb sludge   Sister says - runny nose dirty looking from nostrils. Sister feels patient not being cleaned up frequently  Objective   Blood  pressure 97/67, pulse 96, temperature 100.3 F (37.9 C), temperature source Axillary, resp. rate 14, height 5' 8"  (1.727 m), weight 86 kg, SpO2 100 %.    Vent Mode: PRVC FiO2 (%):  [30 %] 30 % Set Rate:  [18 bmp] 18 bmp Vt Set:  [540 mL] 540 mL PEEP:  [5 cmH20] 5 cmH20 Pressure Support:  [10 cmH20] 10 cmH20 Plateau Pressure:  [16 cmH20-17 cmH20] 16 cmH20   Intake/Output Summary (Last 24 hours) at 05/28/2020 0744 Last data filed at 05/28/2020 0600 Gross per 24 hour  Intake 1471.82 ml  Output 1650 ml  Net -178.18 ml   Filed Weights   05/25/20 0417 05/26/20 0500 05/27/20 0500  Weight: 86 kg 86 kg 86 kg  General Appearance:  Looks criticall ill OBESE - no Head:  Normocephalic, without obvious abnormality, atraumatic Eyes:  PERRL - yes, conjunctiva/corneas - muddy     Ears:  Normal external ear canals, both ears Nose:  G tube - no Throat:  ETT TUBE - yes , OG tube - yes with TF Neck:  Supple,  No enlargement/tenderness/nodules Lungs: Clear to auscultation bilaterally, Ventilator   Synchrony - yes  - RASS - 3 with diprivan Heart:  S1 and S2 normal, no murmur, CVP - x.  Pressors - no Abdomen:  Soft, no masses, no organomegaly Genitalia / Rectal:  Not done Extremities:  Extremities- Left sided BKA Skin:  ntact in exposed areas . Sacral area - not examined Neurologic:  Sedation - diprivan gtt -> RASS - -4   Assessment & Plan:   Acute metabolic encephalopathy from alcohol withdrawal with alcohol withdrawal seizures. Methamphetamine intoxication.  05/28/2020 - still agitated on [place] - do WUA again and reassess - continue keppra - thiamine, folic acid, MVI - Monitor for seizures - continue diprivan gtt with versed prn and fent prn  Acute respiratory failure with hypoxia and compromised airway.   05/28/2020 - > does not meet criteria for SBT/Extubation in setting of Acute Respiratory Failure due to pending wake up asessment. Per RN stil with lot of secretions  Plan  - SBT and if fails no extubation  - PRVC otherwise  - Diurese (see cards)   Acute systolic CHF - new 3/41/96 (last echo 2013 - no results) - global hypokinesis suggests non-ischemic  Plan   - diuerese - check trop   GPC in blood cultures - TTE neg for vegetations but did have poor windows.  No obvious murmur on exam,   05/28/2020 - stil febrile with rising WBC  plan - continue vancomycin, plan is for 7 days of treatment - pan culture - check PCT  - expand abx coverage - get ct scan sinus  Left femur fracture - s/p surgical repair 6/13 (cephalomedullary nailing). - post op care per ortho  Anemia - baseline normal  hgb 2018. New onset at admission 8.3gm%. Likely due to trauma v marrow suppression   05/28/2020 - stable/improving as of  Yesterday  Plan  - recheck cbc 6/16 - - PRBC for hgb </= 6.9gm%    - exceptions are   -  if ACS susepcted/confirmed then transfuse for hgb </= 8.0gm%,  or    -  active bleeding with hemodynamic instability, then transfuse regardless of hemoglobin value   At at  all times try to transfuse 1 unit prbc as possible with exception of active hemorrhage    Thrombocytopenia  - ? Chronic, ? Cause  ? Marrow supression ? Sepsis related  05/28/2020 -  improved as of yesterday. No evidence of cirrhosis on [place] Plan - bid protonix - No heparin till platelet count > 125 at least but check HITT panel to be on safe side  Electrolyte imbalance   plan  - recheck lytes 05/28/20  Best practice:  Diet: NPO, TF DVT prophylaxis: SCDs GI prophylaxis: PPI Glucose control: SSI Mobility: BR Code Status: FULL Family Communication ->  Tangela Glaus updated -> 8:21 AM 05/28/2020   Disposition: ICU     ATTESTATION & SIGNATURE   The patient Thaddeus Trela is critically ill with multiple organ systems failure and requires high complexity decision making for assessment and support, frequent evaluation and titration of therapies, application of advanced monitoring technologies and extensive interpretation of multiple databases.   Critical Care Time devoted to patient care services described in this note is  45  Minutes. This time reflects time of care of this signee Dr Brand Males. This critical care time does not reflect procedure time, or teaching time or supervisory time of PA/NP/Med student/Med Resident etc but could involve care discussion time     Dr. Brand Males, M.D., [facility].C.P Pulmonary and Critical Care Medicine Staff Physician [place] [facility] Pager: [phone], If no answer or between  15:00h - 7:00h: call [phone]  05/28/2020 8:08 AM     LABS    PULMONARY Recent Labs  Lab 05/23/20 2009 05/24/20 0349  PHART 7.382 7.490*  PCO2ART 39.6 32.7  PO2ART 354* 156*  HCO3 23.5 25.0  TCO2 25 26  O2SAT 100.0 100.0    CBC Recent Labs  Lab 05/25/20 0925 05/26/20 0704 05/27/20 0741  HGB 7.7* 7.2* 7.4*  HCT 24.6* 24.1* 24.3*  WBC 9.7 9.7 10.8*  PLT 88* 83* 106*    COAGULATION Recent  Labs  Lab 05/23/20 1513  INR 1.4*    CARDIAC  No results for input(s): TROPONINI in the last 168 hours. No results for input(s): PROBNP in the last 168 hours.   CHEMISTRY Recent Labs  Lab 05/23/20 1513 05/23/20 2009 05/24/20 0349 05/24/20 0349 05/24/20 0702 05/24/20 0702 05/25/20 0925 05/25/20 0925 05/26/20 0704 05/26/20 1048 05/26/20 1702 05/27/20 0741 05/27/20 1626  NA 131*   < > 133*  --  131*  --  132*  --  134*  --   --  134*  --   K 4.5   < > 3.8   < > 3.9   < > 3.0*   < > 3.2*  --   --  3.2*  --   CL 97*  --   --   --  98  --  99  --  104  --   --  101  --   CO2 14*  --   --   --  21*  --  25  --  23  --   --  25  --   GLUCOSE 94  --   --   --  99  --  110*  --  173*  --   --  129*  --   BUN 8  --   --   --  15  --  14  --  9  --   --  10  --   CREATININE 1.49*  --   --   --  1.31*  --  1.18  --  1.04  --   --  0.98  --   CALCIUM 8.5*  --   --   --  7.8*  --  7.8*  --  7.5*  --   --  7.7*  --   MG  --   --   --   --  1.9  --   --   --   --  1.6* 1.6* 2.2 2.3  PHOS  --   --   --   --  2.8  --   --   --   --  1.9* 1.5* 2.3* 1.5*   < > = values in this interval not displayed.   Estimated Creatinine Clearance: 106.9 mL/min (by C-G formula based on SCr of 0.98 mg/dL).   LIVER Recent Labs  Lab 05/23/20 1513  AST 141*  ALT 34  ALKPHOS 178*  BILITOT 3.4*  PROT 7.0  ALBUMIN 2.6*  INR 1.4*     INFECTIOUS Recent Labs  Lab 05/23/20 1513 05/23/20 1736 05/24/20 2224  LATICACIDVEN 8.3* 9.5* 1.8     ENDOCRINE CBG (last 3)  Recent Labs    05/27/20 1943 05/28/20 0435 05/28/20 0732  GLUCAP 113* 91 113*         IMAGING x48h  - image(s) personally visualized  -   highlighted in bold DG CHEST PORT 1 VIEW  Result Date: 05/26/2020 CLINICAL DATA:  Follow-up endotracheal intubation. EXAM: PORTABLE CHEST 1 VIEW COMPARISON:  05/25/2020 FINDINGS: Endotracheal tube is high, at the thoracic inlet region, 10 cm above the carina. Orogastric or nasogastric  tube enters the stomach. Cardiomegaly as seen previously. The patient is rotated towards the right. There is mild atelectasis in the right lower lung. IMPRESSION: Endotracheal tube high, 10 cm above the carina. Some atelectasis at the right lower lung. Electronically Signed   By: Nelson Chimes M.D.   On: 05/26/2020 13:20   US Abdomen Limited RUQ  Result Date: 05/27/2020 CLINICAL DATA:  Cirrhosis EXAM: ULTRASOUND ABDOMEN LIMITED RIGHT UPPER QUADRANT COMPARISON:  June 05, 2016 FINDINGS: Gallbladder: Small amount of possible layering sludge is seen. No gallbladder wall thickening. No sonographic Murphy sign noted by sonographer. Common bile duct: Diameter: 5 mm Liver: Increased echotexture seen throughout. No focal abnormality or biliary ductal dilatation. Portal vein is patent on color Doppler imaging with normal direction of blood flow towards the liver. Other: None. IMPRESSION: Gallbladder sludge, no evidence of acute cholecystitis. Hepatic steatosis. Electronically Signed   By: Prudencio Pair M.D.   On: 05/27/2020 22:07

## 2020-05-29 ENCOUNTER — Inpatient Hospital Stay (HOSPITAL_COMMUNITY): Payer: Medicaid Other

## 2020-05-29 DIAGNOSIS — J9601 Acute respiratory failure with hypoxia: Secondary | ICD-10-CM

## 2020-05-29 LAB — URINE CULTURE: Culture: NO GROWTH

## 2020-05-29 LAB — COMPREHENSIVE METABOLIC PANEL
ALT: 32 U/L (ref 0–44)
AST: 100 U/L — ABNORMAL HIGH (ref 15–41)
Albumin: 1.7 g/dL — ABNORMAL LOW (ref 3.5–5.0)
Alkaline Phosphatase: 140 U/L — ABNORMAL HIGH (ref 38–126)
Anion gap: 9 (ref 5–15)
BUN: 12 mg/dL (ref 6–20)
CO2: 24 mmol/L (ref 22–32)
Calcium: 7.4 mg/dL — ABNORMAL LOW (ref 8.9–10.3)
Chloride: 100 mmol/L (ref 98–111)
Creatinine, Ser: 0.84 mg/dL (ref 0.61–1.24)
GFR calc Af Amer: >60 mL/min (ref >60)
GFR calc non Af Amer: >60 mL/min (ref >60)
Glucose, Bld: 142 mg/dL — ABNORMAL HIGH (ref 70–99)
Potassium: 3.2 mmol/L — ABNORMAL LOW (ref 3.5–5.1)
Sodium: 133 mmol/L — ABNORMAL LOW (ref 135–145)
Total Bilirubin: 2.4 mg/dL — ABNORMAL HIGH (ref 0.3–1.2)
Total Protein: 5.6 g/dL — ABNORMAL LOW (ref 6.5–8.1)

## 2020-05-29 LAB — TRIGLYCERIDES: Triglycerides: 363 mg/dL — ABNORMAL HIGH (ref <150)

## 2020-05-29 LAB — CBC
HCT: 23.7 % — ABNORMAL LOW (ref 39.0–52.0)
Hemoglobin: 7.4 g/dL — ABNORMAL LOW (ref 13.0–17.0)
MCH: 27.6 pg (ref 26.0–34.0)
MCHC: 31.2 g/dL (ref 30.0–36.0)
MCV: 88.4 fL (ref 80.0–100.0)
Platelets: 159 10*3/uL (ref 150–400)
RBC: 2.68 MIL/uL — ABNORMAL LOW (ref 4.22–5.81)
RDW: 20.8 % — ABNORMAL HIGH (ref 11.5–15.5)
WBC: 12.8 10*3/uL — ABNORMAL HIGH (ref 4.0–10.5)
nRBC: 0.9 % — ABNORMAL HIGH (ref 0.0–0.2)

## 2020-05-29 LAB — GLUCOSE, CAPILLARY
Glucose-Capillary: 141 mg/dL — ABNORMAL HIGH (ref 70–99)
Glucose-Capillary: 134 mg/dL — ABNORMAL HIGH (ref 70–99)
Glucose-Capillary: 147 mg/dL — ABNORMAL HIGH (ref 70–99)
Glucose-Capillary: 128 mg/dL — ABNORMAL HIGH (ref 70–99)
Glucose-Capillary: 119 mg/dL — ABNORMAL HIGH (ref 70–99)
Glucose-Capillary: 122 mg/dL — ABNORMAL HIGH (ref 70–99)

## 2020-05-29 LAB — CK TOTAL AND CKMB (NOT AT ARMC)
CK, MB: 0.5 ng/mL (ref 0.5–5.0)
Relative Index: 0.3 (ref 0.0–2.5)
Total CK: 159 U/L (ref 49–397)

## 2020-05-29 LAB — LACTIC ACID, PLASMA: Lactic Acid, Venous: 1.2 mmol/L (ref 0.5–1.9)

## 2020-05-29 LAB — BRAIN NATRIURETIC PEPTIDE: B Natriuretic Peptide: 202.3 pg/mL — ABNORMAL HIGH (ref 0.0–100.0)

## 2020-05-29 LAB — VANCOMYCIN, TROUGH: Vancomycin Tr: 13 ug/mL — ABNORMAL LOW (ref 15–20)

## 2020-05-29 LAB — PHOSPHORUS: Phosphorus: 2 mg/dL — ABNORMAL LOW (ref 2.5–4.6)

## 2020-05-29 LAB — PROTIME-INR
INR: 1.2 (ref 0.8–1.2)
Prothrombin Time: 14.7 seconds (ref 11.4–15.2)

## 2020-05-29 LAB — TROPONIN I (HIGH SENSITIVITY): Troponin I (High Sensitivity): 33 ng/L — ABNORMAL HIGH (ref <18)

## 2020-05-29 LAB — HEPARIN INDUCED PLATELET AB (HIT ANTIBODY): Heparin Induced Plt Ab: 0.223 OD (ref 0.000–0.400)

## 2020-05-29 LAB — PROCALCITONIN: Procalcitonin: 0.92 ng/mL

## 2020-05-29 LAB — MAGNESIUM: Magnesium: 1.8 mg/dL (ref 1.7–2.4)

## 2020-05-29 MED ORDER — FUROSEMIDE 10 MG/ML IJ SOLN
40.0000 mg | Freq: Three times a day (TID) | INTRAMUSCULAR | Status: DC
  Administered 2020-05-29 – 2020-05-31 (×6): 40 mg via INTRAVENOUS
  Filled 2020-05-29 (×6): qty 4

## 2020-05-29 MED ORDER — VANCOMYCIN HCL 1250 MG/250ML IV SOLN
1250.0000 mg | Freq: Two times a day (BID) | INTRAVENOUS | Status: DC
  Administered 2020-05-30 – 2020-06-03 (×9): 1250 mg via INTRAVENOUS
  Filled 2020-05-29 (×10): qty 250

## 2020-05-29 MED ORDER — HALOPERIDOL LACTATE 5 MG/ML IJ SOLN
5.0000 mg | Freq: Four times a day (QID) | INTRAMUSCULAR | Status: DC | PRN
  Administered 2020-05-29 – 2020-06-02 (×3): 5 mg via INTRAVENOUS
  Filled 2020-05-29 (×3): qty 1

## 2020-05-29 MED ORDER — FENTANYL CITRATE (PF) 100 MCG/2ML IJ SOLN
50.0000 ug | INTRAMUSCULAR | Status: DC | PRN
  Filled 2020-05-29: qty 2

## 2020-05-29 MED ORDER — PIVOT 1.5 CAL PO LIQD
1000.0000 mL | ORAL | Status: DC
  Administered 2020-05-29: 1000 mL
  Filled 2020-05-29 (×3): qty 1000

## 2020-05-29 MED ORDER — POTASSIUM PHOSPHATES 15 MMOLE/5ML IV SOLN
30.0000 mmol | Freq: Once | INTRAVENOUS | Status: AC
  Administered 2020-05-29: 30 mmol via INTRAVENOUS
  Filled 2020-05-29: qty 10

## 2020-05-29 MED ORDER — MIDAZOLAM HCL 2 MG/2ML IJ SOLN
1.0000 mg | INTRAMUSCULAR | Status: DC | PRN
  Administered 2020-05-29: 2 mg via INTRAVENOUS

## 2020-05-29 MED ORDER — MAGNESIUM SULFATE 2 GM/50ML IV SOLN
2.0000 g | Freq: Once | INTRAVENOUS | Status: AC
  Administered 2020-05-29: 2 g via INTRAVENOUS
  Filled 2020-05-29: qty 50

## 2020-05-29 MED ORDER — LORAZEPAM 1 MG PO TABS
1.0000 mg | ORAL_TABLET | ORAL | Status: AC | PRN
  Administered 2020-05-29: 2 mg via ORAL
  Filled 2020-05-29 (×3): qty 2

## 2020-05-29 MED ORDER — LORAZEPAM 2 MG/ML IJ SOLN
1.0000 mg | INTRAMUSCULAR | Status: AC | PRN
  Administered 2020-05-30: 4 mg via INTRAVENOUS
  Administered 2020-05-30: 2 mg via INTRAVENOUS
  Administered 2020-05-31: 3 mg via INTRAVENOUS
  Administered 2020-05-31 (×2): 2 mg via INTRAVENOUS
  Administered 2020-05-31: 3 mg via INTRAVENOUS
  Administered 2020-05-31 – 2020-06-01 (×3): 2 mg via INTRAVENOUS
  Administered 2020-06-01: 3 mg via INTRAVENOUS
  Administered 2020-06-01: 2 mg via INTRAVENOUS
  Filled 2020-05-29: qty 1
  Filled 2020-05-29: qty 2
  Filled 2020-05-29 (×3): qty 1
  Filled 2020-05-29: qty 2
  Filled 2020-05-29 (×2): qty 1
  Filled 2020-05-29: qty 2
  Filled 2020-05-29 (×3): qty 1
  Filled 2020-05-29: qty 2

## 2020-05-29 MED ORDER — POTASSIUM CHLORIDE 20 MEQ/15ML (10%) PO SOLN
20.0000 meq | Freq: Once | ORAL | Status: AC
  Administered 2020-05-29: 20 meq
  Filled 2020-05-29: qty 15

## 2020-05-29 MED ORDER — DEXMEDETOMIDINE HCL IN NACL 400 MCG/100ML IV SOLN
0.4000 ug/kg/h | INTRAVENOUS | Status: DC
  Administered 2020-05-29: 0.4 ug/kg/h via INTRAVENOUS
  Administered 2020-05-29: 1.2 ug/kg/h via INTRAVENOUS
  Administered 2020-05-29: 0.8 ug/kg/h via INTRAVENOUS
  Administered 2020-05-30 (×2): 1.1 ug/kg/h via INTRAVENOUS
  Administered 2020-05-30: 0.7 ug/kg/h via INTRAVENOUS
  Administered 2020-05-30: 0.6 ug/kg/h via INTRAVENOUS
  Administered 2020-05-31: 0.8 ug/kg/h via INTRAVENOUS
  Filled 2020-05-29 (×8): qty 100

## 2020-05-29 MED ORDER — HALOPERIDOL LACTATE 5 MG/ML IJ SOLN
1.0000 mg | INTRAMUSCULAR | Status: DC | PRN
  Administered 2020-05-29: 2 mg via INTRAVENOUS
  Filled 2020-05-29: qty 1

## 2020-05-29 MED ORDER — PRO-STAT SUGAR FREE PO LIQD
30.0000 mL | Freq: Two times a day (BID) | ORAL | Status: DC
  Administered 2020-06-01: 30 mL
  Filled 2020-05-29 (×4): qty 30

## 2020-05-29 NOTE — Progress Notes (Signed)
NAME:  [name], MRN:  [id number], DOB:  08-Mar-1981, LOS: 6 ADMISSION DATE:  05/23/2020, CONSULTATION DATE: 6/11 REFERRING MD: Dr. Maryan Rued, CHIEF COMPLAINT: Altered mental status  Brief History   39 yo male with hx of ETOH and seizure disordered presented after drinking binge and then alcohol withdrawal seizures.  Required intubation for airway protection.  Found to also have Lt hip fracture.  He has been non-compliant with his medications.  UDS positive for amphetamines.  Past Medical History  CVA, Seizure  Significant Hospital Events   6/11 Admit  6/12 PRBC transfusion 6/13 off LTM, to OR for repair of left hip fx  Consults:  Neurology Ortho  Procedures:  ETT 6/11 >  Significant Diagnostic Tests:   Xray L hip 6/11  > Comminuted intertrochanteric femur fracture on the left.  CT head / Cspine 6/11 > atrophy, C4-5 and C5-6 disc space narrowing and osteophyte formation  Echo 6/13 >> EF 45 - 50%, no obvious vegetations but poor windows, consider TEE  EEG 6/13 > mod to diffuse severe encephalopathy  CT Maxillofacial noncon 6/17> severe mucosal thickening of R maxillary sinus, probable chronic sinusitis. L sided acute sinusitis   Micro Data:  Blood 6/11 > GPC >> Staph hominis, staph haemolyticus  Urine 6/11 > Staph haemolyticus  Blood 6/16 >> GPC  Antimicrobials:  Cefepime 6/11 > 6/12 Vancomycin 6/11 >>  Interim history/subjective:  Weaning vent 6/17 AM Has attempted to self extubate twice this morning  CT maxillofacial with chronic sinusitis   Objective   Blood pressure 90/62, pulse 85, temperature 99.2 F (37.3 C), temperature source Axillary, resp. rate (!) 29, height 5\' 8"  (1.727 m), weight 86 kg, SpO2 100 %.    Vent Mode: PSV;CPAP FiO2 (%):  [30 %] 30 % Set Rate:  [18 bmp] 18 bmp Vt Set:  [540 mL] 540 mL PEEP:  [5 cmH20] 5 cmH20 Pressure Support:  [5 cmH20] 5 cmH20 Plateau Pressure:  [5 cmH20-16 cmH20] 5 cmH20   Intake/Output Summary (Last 24  hours) at 05/29/2020 0838 Last data filed at 05/29/2020 0700 Gross per 24 hour  Intake 3338.84 ml  Output 2800 ml  Net 538.84 ml   Filed Weights   05/25/20 0417 05/26/20 0500 05/27/20 0500  Weight: 86 kg 86 kg 86 kg    Examination: General: Middle aged M, intubated lightly sedated, agitated appearing  Neuro: Lightly sedated. Awakens to voice, following commands. Moving BUE BLE spontaneously  HEENT:  R periorbital edema. ETT OGT secure. Pink mmm. Moderate clear oral and nasal secretions  Cardiovascular: RRR s1s2 no rgm cap refill < 3seconds. 2+ radial pulses   Lungs: Symmetrical chest expansion, no accessory muscle use on PSV/CPAP. CTA bilaterally  Abdomen: soft round ndnt + bowel sounds  Musculoskeletal: BUE soft restraints. R BKA. RUE PICC Skin: Intact, warm, no rashes.  Assessment & Plan:   Acute metabolic encephalopathy from alcohol withdrawal with alcohol withdrawal seizures. Methamphetamine intoxication. - continue keppra - thiamine, folic acid, MVI - Monitor for seizures - Will need TOC for substance abuse cessation resources when medically appropriate   Acute respiratory failure with hypoxia and compromised airway. - continue PSV wean and hopefully move towards extubation later once mental status and secretions allow Sinusitis, chronic R sided and acute L sided P -continue efforts at weaning vent, WUA/SBT qAM -hope to extubate 6/17 -- if unable, change propofol to precedex due to increasing trigs, no further seizure activity  -Aggressive pulm hygiene   GPC in blood cultures -  -prior  BCx with staph hominus and staph haemolyticus   -TTE neg for vegetations but did have poor windows.  No obvious murmur on exam, will defer TEE for now. Low grade temp P - continue vancomycin, plan is for 7 days of treatment - trend WBC, Fever curve - Cont to f/u cx data  Left femur fracture - s/p surgical repair 6/13 (cephalomedullary nailing). - post op care per ortho  Anemia    Thrombocytopenia, improving   Plan - bid protonix - f/u CBC, iron levels - transfuse for Hb < 7 or significant bleeding  Hypokalemia Hyponatremia Plan -Replacing K -Monitor Na, if able to extubate will eval for PO diet   Best practice:  Diet: NPO, TF DVT prophylaxis: SCDs GI prophylaxis: PPI Glucose control: SSI Mobility: BR Code Status: FULL Family Communication: Pending 6/27 Disposition: ICU  CRITICAL CARE Performed by: Cristal Generous   Total critical care time: 42 minutes  Critical care time was exclusive of separately billable procedures and treating other patients. Critical care was necessary to treat or prevent imminent or life-threatening deterioration.  Critical care was time spent personally by me on the following activities: development of treatment plan with patient and/or surrogate as well as nursing, discussions with consultants, evaluation of patient's response to treatment, examination of patient, obtaining history from patient or surrogate, ordering and performing treatments and interventions, ordering and review of laboratory studies, ordering and review of radiographic studies, pulse oximetry and re-evaluation of patient's condition.   Eliseo Gum MSN, AGACNP-BC [place] [phone] If no answer, [phone] 05/29/2020, 8:38 AM

## 2020-05-29 NOTE — Progress Notes (Addendum)
Pharmacy Antibiotic Note  [name] is a 39 y.o. male admitted on 05/23/2020 with AMS.  Pharmacy has been consulted for vancomycin and cefepime dosing.  Of note, patient is growing 2 different strains of CoNS in each set of blood cultures, which could represent a contaminant.  However, repeat blood cultures are growing GPC.    Renal function is improving and vancomycin trough is slightly sub-therapeutic at 13 mcg/mL.  Afebrile, WBC up to 12.8.  Plan: Increase vanc to 1250mg  IV Q12H for goal 15-20 mcg/mL Continue cefepime 2gm IV Q8H Monitor renal fxn, micro data, weekly vanc trough   Temp (24hrs), Avg:99.2 F (37.3 C), Min:98.5 F (36.9 C), Max:100 F (37.8 C)  Recent Labs  Lab 05/23/20 1513 05/23/20 1736 05/24/20 0702 05/24/20 2224 05/25/20 0925 05/26/20 0704 05/27/20 0741 05/28/20 0643 05/28/20 1337 05/29/20 0526 05/29/20 1930  WBC 13.3*  --    < > 12.2*   < > 9.7 10.8* 10.4 10.2 12.8*  --   CREATININE 1.49*  --    < >  --    < > 1.04 0.98 0.93 0.85 0.84  --   LATICACIDVEN 8.3* 9.5*  --  1.8  --   --   --   --   --  1.2  --   VANCOTROUGH  --   --   --   --   --  16  --   --   --   --  13*   < > = values in this interval not displayed.    Estimated Creatinine Clearance: 124.7 mL/min (by C-G formula based on SCr of 0.84 mg/dL).    No Known Allergies  Cefepime 6/11 >> 6/12, 6/16 >> Vanc 6/11 >>   6/14 VT = 16  6/17 VT = 13 mcg/mL on 1g q12 >> 1250mg  q12  6/16 BCx: GPC clusters, pending 6/16 TA: ngtd 6/14 TA: normal flora 6/11 BCx: 1 set staph hominis; 1 set staph haemolyticus - ?possible contaminant 6/11 UCx: (in/out cath): 5k col staph haemolyticus 6/11 mrsa pcr - neg  Caressa Scearce D. Mina Marble, PharmD, BCPS, [place] 05/29/2020, 9:04 PM

## 2020-05-29 NOTE — Progress Notes (Signed)
Requested to retract PICC line per MD due to CXR results.  Recommend leaving PICC in current position per policy due to placed by ECG technology.  Dr Chase Caller notified via securechat, states to leave in current position.  Also states no dysrhythmias that he is aware of.

## 2020-05-29 NOTE — Progress Notes (Signed)
6 days LOS   Call from Dr Redmond Baseman  - reviewed CT images - he is concerned there might be a maxillary sinus fracture.   He will do a consult     SIGNATURE    Dr. Brand Males, M.D., F.C.C.P,  Pulmonary and Critical Care Medicine Staff Physician, [place] Director - Interstitial Lung Disease  Program  [facility] [place] [facility] [place], [place], [address]  Pager: [phone], If no answer or between  15:00h - 7:00h: call [phone] Telephone: [phone]  10:13 AM 05/29/2020

## 2020-05-29 NOTE — Progress Notes (Signed)
Nutrition Follow-up  DOCUMENTATION CODES:   Not applicable  INTERVENTION:   Tube feeding via OG tube: Pivot 1.5 @ 45 ml/h (1080 ml per day) Pro-stat 30 ml BID  Provides 1820 kcal, 131 gm protein, 819 ml free water daily TF regimen and propofol at current rate providing 2348 total kcal/day    NUTRITION DIAGNOSIS:   Increased nutrient needs related to post-op healing as evidenced by estimated needs. Ongoing.   GOAL:   Patient will meet greater than or equal to 90% of their needs Meeting with TF  MONITOR:   TF tolerance, Vent status, Labs  REASON FOR ASSESSMENT:   Consult, Ventilator Enteral/tube feeding initiation and management  ASSESSMENT:   Pt with PMH of ETOH and sz disorder who is noncompliant with medications admitted after drinking binge then alcohol withdrawal seizures and fall at home.   Pt remains agitated, attempting self-extubation requiring sedation. Noted fevers. ENT consulted for possible maxillary sinus fx.   6/11 admit; xray showed L hip fx; positive for methaphetamines  6/13 s/p L hip fx repair  Patient is currently intubated on ventilator support MV: 15.6 L/min Temp (24hrs), Avg:100.1 F (37.8 C), Min:99 F (37.2 C), Max:101.7 F (38.7 C)  Propofol: 20 ml/hr provides: 528 kcal  Medications reviewed and include: vitamin D3, folic acid, lasix, MVI, thiamine  K-phos x 1  Labs reviewed:Na 133 (L), K= 3.2 (L), PO4: 2 (L), TG: 363 (H), vitamin D 7   TF: Vital AF 1.2 at 40 ml/h with Pro-stat 30 ml TID Provides 1452 kcal, 117 gm protein    Diet Order:   Diet Order            Diet NPO time specified  Diet effective now                 EDUCATION NEEDS:   No education needs have been identified at this time  Skin:  Skin Assessment: Reviewed RN Assessment  Last BM:  6/17  Height:   Ht Readings from Last 1 Encounters:  05/24/20 5\' 8"  (1.727 m)    Weight:   Wt Readings from Last 1 Encounters:  05/27/20 86 kg    Ideal Body  Weight:  65.4 kg  BMI:  28.3  Estimated Nutritional Needs:   Kcal:  2347  Protein:  115-130 grams  Fluid:  2 L/day  Lockie Pares., RD, LDN, CNSC See AMiON for contact information

## 2020-05-29 NOTE — Progress Notes (Signed)
RT NOTES: Called to room by RN. Patient has self-extubated. Placed on 4lpm nasal cannula. CCM aware.

## 2020-05-29 NOTE — Progress Notes (Signed)
Called to bedside for assessment after patient self-extubated.  Pt SpO2 100% on 4LNC, no distress. PCCM Physician notified.   Acute respiratory failure due to EtOH withdrawal related seizure, no further seizure activity Remained intubated due to agitated delirium P Will dc PAD and Vent orders Maintain NPO  Monitor for intubation risk  Will start CIWA -- ok to keep precedex available for now. Not sure if agitation is truly EtOH dts at this juncture however, closely evaluate  Check qtc    Eliseo Gum MSN, AGACNP-BC [place] [phone] If no answer, [phone] 05/29/2020, 3:04 PM

## 2020-05-30 DIAGNOSIS — F1590 Other stimulant use, unspecified, uncomplicated: Secondary | ICD-10-CM | POA: Diagnosis present

## 2020-05-30 DIAGNOSIS — R509 Fever, unspecified: Secondary | ICD-10-CM | POA: Diagnosis present

## 2020-05-30 DIAGNOSIS — G9340 Encephalopathy, unspecified: Secondary | ICD-10-CM | POA: Diagnosis present

## 2020-05-30 DIAGNOSIS — R410 Disorientation, unspecified: Secondary | ICD-10-CM | POA: Diagnosis present

## 2020-05-30 DIAGNOSIS — D6489 Other specified anemias: Secondary | ICD-10-CM | POA: Diagnosis present

## 2020-05-30 DIAGNOSIS — G40909 Epilepsy, unspecified, not intractable, without status epilepticus: Secondary | ICD-10-CM | POA: Diagnosis present

## 2020-05-30 DIAGNOSIS — J329 Chronic sinusitis, unspecified: Secondary | ICD-10-CM | POA: Diagnosis present

## 2020-05-30 DIAGNOSIS — R7881 Bacteremia: Secondary | ICD-10-CM | POA: Diagnosis not present

## 2020-05-30 LAB — DIFFERENTIAL
Abs Immature Granulocytes: 0 10*3/uL (ref 0.00–0.07)
Basophils Absolute: 0.2 10*3/uL — ABNORMAL HIGH (ref 0.0–0.1)
Basophils Relative: 1 %
Eosinophils Absolute: 0 10*3/uL (ref 0.0–0.5)
Eosinophils Relative: 0 %
Lymphocytes Relative: 10 %
Lymphs Abs: 1.6 10*3/uL (ref 0.7–4.0)
Monocytes Absolute: 1.6 10*3/uL — ABNORMAL HIGH (ref 0.1–1.0)
Monocytes Relative: 10 %
Neutro Abs: 12.3 10*3/uL — ABNORMAL HIGH (ref 1.7–7.7)
Neutrophils Relative %: 79 %
nRBC: 1 /100 WBC — ABNORMAL HIGH

## 2020-05-30 LAB — PROCALCITONIN: Procalcitonin: 0.94 ng/mL

## 2020-05-30 LAB — COMPREHENSIVE METABOLIC PANEL
ALT: 38 U/L (ref 0–44)
AST: 118 U/L — ABNORMAL HIGH (ref 15–41)
Albumin: 2 g/dL — ABNORMAL LOW (ref 3.5–5.0)
Alkaline Phosphatase: 143 U/L — ABNORMAL HIGH (ref 38–126)
Anion gap: 8 (ref 5–15)
BUN: 12 mg/dL (ref 6–20)
CO2: 28 mmol/L (ref 22–32)
Calcium: 8.4 mg/dL — ABNORMAL LOW (ref 8.9–10.3)
Chloride: 101 mmol/L (ref 98–111)
Creatinine, Ser: 0.94 mg/dL (ref 0.61–1.24)
GFR calc Af Amer: >60 mL/min (ref >60)
GFR calc non Af Amer: >60 mL/min (ref >60)
Glucose, Bld: 118 mg/dL — ABNORMAL HIGH (ref 70–99)
Potassium: 3.3 mmol/L — ABNORMAL LOW (ref 3.5–5.1)
Sodium: 137 mmol/L (ref 135–145)
Total Bilirubin: 2.3 mg/dL — ABNORMAL HIGH (ref 0.3–1.2)
Total Protein: 6.5 g/dL (ref 6.5–8.1)

## 2020-05-30 LAB — GLUCOSE, CAPILLARY
Glucose-Capillary: 129 mg/dL — ABNORMAL HIGH (ref 70–99)
Glucose-Capillary: 123 mg/dL — ABNORMAL HIGH (ref 70–99)
Glucose-Capillary: 110 mg/dL — ABNORMAL HIGH (ref 70–99)
Glucose-Capillary: 109 mg/dL — ABNORMAL HIGH (ref 70–99)
Glucose-Capillary: 125 mg/dL — ABNORMAL HIGH (ref 70–99)
Glucose-Capillary: 110 mg/dL — ABNORMAL HIGH (ref 70–99)

## 2020-05-30 LAB — HEPATITIS PANEL, ACUTE
HCV Ab: NONREACTIVE
Hep A IgM: NONREACTIVE
Hep B C IgM: NONREACTIVE
Hepatitis B Surface Ag: NONREACTIVE

## 2020-05-30 LAB — CBC
HCT: 26.2 % — ABNORMAL LOW (ref 39.0–52.0)
HCT: 27.1 % — ABNORMAL LOW (ref 39.0–52.0)
Hemoglobin: 8 g/dL — ABNORMAL LOW (ref 13.0–17.0)
Hemoglobin: 8.4 g/dL — ABNORMAL LOW (ref 13.0–17.0)
MCH: 26.8 pg (ref 26.0–34.0)
MCH: 26.9 pg (ref 26.0–34.0)
MCHC: 30.5 g/dL (ref 30.0–36.0)
MCHC: 31 g/dL (ref 30.0–36.0)
MCV: 86.9 fL (ref 80.0–100.0)
MCV: 87.9 fL (ref 80.0–100.0)
Platelets: 194 10*3/uL (ref 150–400)
Platelets: 221 10*3/uL (ref 150–400)
RBC: 2.98 MIL/uL — ABNORMAL LOW (ref 4.22–5.81)
RBC: 3.12 MIL/uL — ABNORMAL LOW (ref 4.22–5.81)
RDW: 20.2 % — ABNORMAL HIGH (ref 11.5–15.5)
RDW: 20.3 % — ABNORMAL HIGH (ref 11.5–15.5)
WBC: 15.4 10*3/uL — ABNORMAL HIGH (ref 4.0–10.5)
WBC: 15.6 10*3/uL — ABNORMAL HIGH (ref 4.0–10.5)
nRBC: 0.5 % — ABNORMAL HIGH (ref 0.0–0.2)
nRBC: 0.5 % — ABNORMAL HIGH (ref 0.0–0.2)

## 2020-05-30 LAB — CULTURE, RESPIRATORY W GRAM STAIN: Culture: NORMAL

## 2020-05-30 LAB — PHOSPHORUS: Phosphorus: 3.6 mg/dL (ref 2.5–4.6)

## 2020-05-30 MED ORDER — ENOXAPARIN SODIUM 40 MG/0.4ML ~~LOC~~ SOLN
40.0000 mg | SUBCUTANEOUS | Status: DC
  Administered 2020-05-30 – 2020-06-06 (×4): 40 mg via SUBCUTANEOUS
  Filled 2020-05-30 (×3): qty 0.4

## 2020-05-30 MED ORDER — POTASSIUM CHLORIDE 10 MEQ/50ML IV SOLN
10.0000 meq | INTRAVENOUS | Status: AC
  Administered 2020-05-30 (×6): 10 meq via INTRAVENOUS
  Filled 2020-05-30 (×5): qty 50

## 2020-05-30 MED ORDER — ORAL CARE MOUTH RINSE
15.0000 mL | Freq: Every morning | OROMUCOSAL | Status: DC
  Administered 2020-05-31 – 2020-06-07 (×5): 15 mL via OROMUCOSAL

## 2020-05-30 MED ORDER — POTASSIUM CHLORIDE 20 MEQ/15ML (10%) PO SOLN: 40.0000 meq | Freq: Once | ORAL | Status: DC

## 2020-05-30 NOTE — Progress Notes (Signed)
Orthopaedic Trauma Progress Note  S: Extubated yesterday. Patient currently sleeping comfortably. No issues of note  O:  Vitals:   05/30/20 0700 05/30/20 0800  BP: 101/80 97/75  Pulse:  63  Resp: 12 16  Temp:  98.6 F (37 C)  SpO2:  99%    General: Resting comfortably, NAD Left lower extremity: Dressings clean, dry, intact.  Swelling appropriate. Extremity warm  Imaging: Stable post op imaging.   Labs:  Results for orders placed or performed during the hospital encounter of 05/23/20 (from the past 24 hour(s))  Glucose, capillary     Status: Abnormal   Collection Time: 05/29/20 11:11 AM  Result Value Ref Range   Glucose-Capillary 147 (H) 70 - 99 mg/dL  Glucose, capillary     Status: Abnormal   Collection Time: 05/29/20  3:32 PM  Result Value Ref Range   Glucose-Capillary 128 (H) 70 - 99 mg/dL  Glucose, capillary     Status: Abnormal   Collection Time: 05/29/20  7:11 PM  Result Value Ref Range   Glucose-Capillary 119 (H) 70 - 99 mg/dL  Vancomycin, trough     Status: Abnormal   Collection Time: 05/29/20  7:30 PM  Result Value Ref Range   Vancomycin Tr 13 (L) 15 - 20 ug/mL  Glucose, capillary     Status: Abnormal   Collection Time: 05/29/20 11:05 PM  Result Value Ref Range   Glucose-Capillary 122 (H) 70 - 99 mg/dL  CBC     Status: Abnormal   Collection Time: 05/29/20 11:39 PM  Result Value Ref Range   WBC 15.4 (H) 4.0 - 10.5 K/uL   RBC 2.98 (L) 4.22 - 5.81 MIL/uL   Hemoglobin 8.0 (L) 13.0 - 17.0 g/dL   HCT 26.2 (L) 39 - 52 %   MCV 87.9 80.0 - 100.0 fL   MCH 26.8 26.0 - 34.0 pg   MCHC 30.5 30.0 - 36.0 g/dL   RDW 20.2 (H) 11.5 - 15.5 %   Platelets 194 150 - 400 K/uL   nRBC 0.5 (H) 0.0 - 0.2 %  Glucose, capillary     Status: Abnormal   Collection Time: 05/30/20  3:17 AM  Result Value Ref Range   Glucose-Capillary 129 (H) 70 - 99 mg/dL  Procalcitonin     Status: None   Collection Time: 05/30/20  5:22 AM  Result Value Ref Range   Procalcitonin 0.94 ng/mL   Phosphorus     Status: None   Collection Time: 05/30/20  5:22 AM  Result Value Ref Range   Phosphorus 3.6 2.5 - 4.6 mg/dL  Comprehensive metabolic panel     Status: Abnormal   Collection Time: 05/30/20  5:22 AM  Result Value Ref Range   Sodium 137 135 - 145 mmol/L   Potassium 3.3 (L) 3.5 - 5.1 mmol/L   Chloride 101 98 - 111 mmol/L   CO2 28 22 - 32 mmol/L   Glucose, Bld 118 (H) 70 - 99 mg/dL   BUN 12 6 - 20 mg/dL   Creatinine, Ser 0.94 0.61 - 1.24 mg/dL   Calcium 8.4 (L) 8.9 - 10.3 mg/dL   Total Protein 6.5 6.5 - 8.1 g/dL   Albumin 2.0 (L) 3.5 - 5.0 g/dL   AST 118 (H) 15 - 41 U/L   ALT 38 0 - 44 U/L   Alkaline Phosphatase 143 (H) 38 - 126 U/L   Total Bilirubin 2.3 (H) 0.3 - 1.2 mg/dL   GFR calc non Af Amer >60 >60 mL/min  GFR calc Af Amer >60 >60 mL/min   Anion gap 8 5 - 15  Glucose, capillary     Status: Abnormal   Collection Time: 05/30/20  7:40 AM  Result Value Ref Range   Glucose-Capillary 123 (H) 70 - 99 mg/dL    Assessment: 39 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    Pain management: Per primary team  VTE prophylaxis: Lovenox once cleared by critical care team SCDs: Ordered, in place on RLE  ID:  Ancef 2gm x24 hours post op completed.  Remains on vancomycin for sepsis  Foley/Lines: Foley in place.  Continue IV fluids per critical care  Medical co-morbidities: alcohol abuse, seizures, thrombocytopenia, asthma, hypokalemia, history of mental retardation, history of left BKA  Impediments to Fracture Healing: Vitamin D level 7, continue vitamin D2 and D3 supplementation   Dispo: PT/OT evaluation once able  Follow - up plan: TBD  Contact information:  Katha Hamming  MD, Patrecia Pace PA-C   [facility]en [facility] [phone] (office) [email or web address]

## 2020-05-30 NOTE — Progress Notes (Addendum)
[place] Progress Note Patient Name: [name] DOB: March 11, 1981 MRN: [id number]   Date of Service  05/30/2020  HPI/Events of Note  Pt needs a foley for accurate urine output monitoring, a condom catheter has been trialed unsuccessfully.  eICU Interventions  Order for foley entered.        Kerry Kass Danish Ruffins 05/30/2020, 1:14 AM

## 2020-05-30 NOTE — Progress Notes (Signed)
K 3.3 Electrolytes replaced per protocol 

## 2020-05-30 NOTE — Progress Notes (Signed)
Nutrition Follow-up  DOCUMENTATION CODES:   Not applicable  INTERVENTION:   RD to monitor for adequacy of oral intake as diet is advanced and add PO supplements as needed.   If unable to safely advance PO diet, consider placing enteral feeding tube for nutrition support. Pivot 1.5 at 65 ml/h would provide 2340 kcal, 146 gm protein, 1170 ml free water daily.   NUTRITION DIAGNOSIS:   Increased nutrient needs related to post-op healing as evidenced by estimated needs.  Ongoing   GOAL:   Patient will meet greater than or equal to 90% of their needs  Unmet  MONITOR:   TF tolerance, Vent status, Labs   ASSESSMENT:   Pt with PMH of ETOH and sz disorder who is noncompliant with medications admitted after drinking binge then alcohol withdrawal seizures and fall at home.  Patient self-extubated 6/17. OG tube was removed and TF has been off since yesterday. Potential for procedure with ENT today per discussion with RN, so patient will remain NPO for now.   Labs reviewed. K 3.3 (L) CBG: 129-123  Medications reviewed and include vitamin D, folic acid, lasix, MVI with minerals, thiamine, KCl.  Diet Order:   Diet Order            Diet NPO time specified  Diet effective now                 EDUCATION NEEDS:   No education needs have been identified at this time  Skin:  Skin Assessment: Reviewed RN Assessment  Last BM:  6/17  Height:   Ht Readings from Last 1 Encounters:  05/24/20 5\' 8"  (1.727 m)    Weight:   Wt Readings from Last 1 Encounters:  05/30/20 82.5 kg    Ideal Body Weight:  65.4 kg  BMI:  Body mass index is 27.65 kg/m.  Estimated Nutritional Needs:   Kcal:  2400-2600  Protein:  115-130 grams  Fluid:  2.4 L    Lucas Mallow, RD, LDN, CNSC Please refer to Amion for contact information.

## 2020-05-30 NOTE — Consult Note (Signed)
[facility] for Infectious Disease    Date of Admission:  05/23/2020           Day 8 vancomycin        Day 3 cefepime       Reason for Consult: Persistent fever and possible coagulase-negative staph bacteremia    Referring Provider: Eliseo Gum, NP  Assessment: He has multiple potential sources of fever.  His positive blood cultures may represent true bacteremia.  I will asked the laboratory to do susceptibility testing on both isolate to see if they are the same (probable bacteremia) or different (probable contaminants).  He has acute left sphenoid sinusitis which puts him at risk for meningitis.  Hepatitis, withdrawal and drug fever are also possibilities.  It appears that his fevers are trending down since cefepime was added.  We will repeat blood cultures today.  Ideally it would be best to remove his PICC in place a temporary peripheral IV since the PICC was placed on the day that he had positive blood cultures again.  Plan: 1. Continue vancomycin and cefepime 2. Consider removing PICC if other access is available 3. Repeat blood cultures 4. Acute hepatitis panel 5. Add differential to today's CBC   Principal Problem:   Fever Active Problems:   Positive blood culture   Sinusitis   Delirium   Alcohol abuse   Hx of BKA, left (HCC)   Steatosis of liver   Thoracic ascending aortic aneurysm (HCC)   Tobacco abuse   Seizure ([place])   Elective surgery   S/p left hip fracture   Respiratory failure ([place])   Acute hypoxemic respiratory failure (HCC)   Amphetamine user (HCC)   Normocytic anemia   Scheduled Meds: . sodium chloride   Intravenous Once  . chlorhexidine gluconate (MEDLINE KIT)  15 mL Mouth Rinse BID  . Chlorhexidine Gluconate Cloth  6 each Topical Daily  . cholecalciferol  2,000 Units Per Tube Daily  . enoxaparin (LOVENOX) injection  40 mg Subcutaneous Q24H  . ergocalciferol  50,000 Units Per Tube Q7 days  . feeding supplement (PRO-STAT SUGAR FREE  64)  30 mL Per Tube BID  . folic acid  1 mg Per Tube Daily  . furosemide  40 mg Intravenous Q8H  . mouth rinse  15 mL Mouth Rinse 10 times per day  . multivitamin with minerals  1 tablet Per Tube Daily  . pantoprazole (PROTONIX) IV  40 mg Intravenous Q12H  . sodium chloride flush  10-40 mL Intracatheter Q12H  . thiamine  100 mg Per Tube Daily   Continuous Infusions: . sodium chloride Stopped (05/27/20 0021)  . ceFEPime (MAXIPIME) IV Stopped (05/30/20 0701)  . dexmedetomidine (PRECEDEX) IV infusion 0.9 mcg/kg/hr (05/30/20 0939)  . feeding supplement (PIVOT 1.5 CAL) Stopped (05/30/20 0800)  . levETIRAcetam 750 mg (05/30/20 2263)  . potassium chloride 10 mEq (05/30/20 1109)  . vancomycin 1,250 mg (05/30/20 0417)   PRN Meds:.sodium chloride, acetaminophen (TYLENOL) oral liquid 160 mg/5 mL, docusate, haloperidol lactate, LORazepam **OR** LORazepam, polyethylene glycol, sodium chloride flush  HPI: [name] is a 39 y.o. male with a history of heavy alcohol use and seizures who was admitted on 05/23/2020 after suffering seizures and a fall at home.  He was febrile, confused and agitated.  He was intubated.  He was found to have an acute left hip fracture that required intramedullary nailing on 05/25/2020.  Admission blood cultures grew staph hemolyticus and 1 set and staph hominis  in the other.  This was interpreted as probable contaminant but urine culture grew staph hemolyticus suggesting that he may have actually been bacteremic.  He was started on vancomycin but continued to have fevers.  Repeat UA was negative.  There is no evidence of endocarditis transthoracic echocardiogram.  CT scan of the head showed what appeared to be chronic right maxillary sinusitis and acute left sphenoid sinusitis.  He has continued to have intermittent fevers.  Cefepime was added to vancomycin 3 days ago.  Blood cultures at that time are growing staph hominis in both sets with susceptibilities pending.  He remains  delirious.  HIV antibody is negative.  His liver enzymes are elevated.  Ultrasound shows fatty liver with no acute changes.  There is no evidence of pneumonia on chest x-ray.   Review of Systems: Review of Systems  Unable to perform ROS: Mental acuity    Past Medical History:  Diagnosis Date  . Alcohol abuse   . Seizures ([place])   . Stroke [facility])     Social History   Tobacco Use  . Smoking status: Current Every Day Smoker    Packs/day: 0.33    Years: 20.00    Pack years: 6.60    Types: Cigarettes  . Smokeless tobacco: Never Used  Substance Use Topics  . Alcohol use: Yes    Alcohol/week: 2.0 standard drinks    Types: 2 Cans of beer per week    Comment: not every day  . Drug use: No    Family History  Problem Relation Age of Onset  . Heart attack Mother        Negative Hx   No Known Allergies  OBJECTIVE: Blood pressure 118/79, pulse 60, temperature 98.6 F (37 C), temperature source Axillary, resp. rate 16, height 5' 8"  (1.727 m), weight 82.5 kg, SpO2 98 %.  Physical Exam Constitutional:      Comments: He mumbles unintelligibly  Cardiovascular:     Rate and Rhythm: Regular rhythm. Tachycardia present.     Heart sounds: No murmur heard.   Pulmonary:     Effort: Pulmonary effort is normal.     Breath sounds: Normal breath sounds.  Abdominal:     General: There is no distension.     Palpations: Abdomen is soft. There is no mass.     Tenderness: There is no abdominal tenderness.  Musculoskeletal:        General: No swelling or tenderness.     Cervical back: Neck supple.     Comments: His left hip incision looks good  Skin:    Findings: No rash.     Comments: Scars on both arms.  Neurological:     General: No focal deficit present.     Lab Results Lab Results  Component Value Date   WBC 15.4 (H) 05/29/2020   HGB 8.0 (L) 05/29/2020   HCT 26.2 (L) 05/29/2020   MCV 87.9 05/29/2020   PLT 194 05/29/2020    Lab Results  Component Value Date   CREATININE  0.94 05/30/2020   BUN 12 05/30/2020   NA 137 05/30/2020   K 3.3 (L) 05/30/2020   CL 101 05/30/2020   CO2 28 05/30/2020    Lab Results  Component Value Date   ALT 38 05/30/2020   AST 118 (H) 05/30/2020   ALKPHOS 143 (H) 05/30/2020   BILITOT 2.3 (H) 05/30/2020     Microbiology: Recent Results (from the past 240 hour(s))  Urine culture     Status:  Abnormal   Collection Time: 05/23/20  3:13 PM   Specimen: In/Out Cath Urine  Result Value Ref Range Status   Specimen Description IN/OUT CATH URINE  Final   Special Requests   Final    NONE Performed at [facility], [address], [place], [place] [address]    Culture 5,000 COLONIES/mL STAPHYLOCOCCUS HAEMOLYTICUS (A)  Final   Report Status 05/25/2020 FINAL  Final   Organism ID, Bacteria STAPHYLOCOCCUS HAEMOLYTICUS (A)  Final      Susceptibility   Staphylococcus haemolyticus - MIC*    CIPROFLOXACIN <=0.5 SENSITIVE Sensitive     GENTAMICIN <=0.5 SENSITIVE Sensitive     NITROFURANTOIN <=16 SENSITIVE Sensitive     OXACILLIN >=4 RESISTANT Resistant     TETRACYCLINE >=16 RESISTANT Resistant     VANCOMYCIN <=0.5 SENSITIVE Sensitive     TRIMETH/SULFA <=10 SENSITIVE Sensitive     CLINDAMYCIN RESISTANT Resistant     RIFAMPIN <=0.5 SENSITIVE Sensitive     Inducible Clindamycin POSITIVE Resistant     * 5,000 COLONIES/mL STAPHYLOCOCCUS HAEMOLYTICUS  Blood Culture (routine x 2)     Status: Abnormal   Collection Time: 05/23/20  4:55 PM   Specimen: BLOOD LEFT FOREARM  Result Value Ref Range Status   Specimen Description BLOOD LEFT FOREARM  Final   Special Requests   Final    BOTTLES DRAWN AEROBIC AND ANAEROBIC Blood Culture adequate volume   Culture  Setup Time   Final    GRAM POSITIVE COCCI IN CLUSTERS IN BOTH AEROBIC AND ANAEROBIC BOTTLES CRITICAL RESULT CALLED TO, READ BACK BY AND VERIFIED WITH: RN MIKE M 24 [id number] FCP    Culture (A)  Final    STAPHYLOCOCCUS HAEMOLYTICUS THE SIGNIFICANCE OF ISOLATING THIS ORGANISM FROM A  SINGLE SET OF BLOOD CULTURES WHEN MULTIPLE SETS ARE DRAWN IS UNCERTAIN. PLEASE NOTIFY THE MICROBIOLOGY DEPARTMENT WITHIN ONE WEEK IF SPECIATION AND SENSITIVITIES ARE REQUIRED. Performed at [facility], [place] [address], [place], [place] [address]    Report Status 05/27/2020 FINAL  Final  Blood Culture ID Panel (Reflexed)     Status: Abnormal   Collection Time: 05/23/20  4:55 PM  Result Value Ref Range Status   Enterococcus species NOT DETECTED NOT DETECTED Final   Listeria monocytogenes NOT DETECTED NOT DETECTED Final   Staphylococcus species DETECTED (A) NOT DETECTED Final    Comment: Methicillin (oxacillin) resistant coagulase negative staphylococcus. Possible blood culture contaminant (unless isolated from more than one blood culture draw or clinical case suggests pathogenicity). No antibiotic treatment is indicated for blood  culture contaminants. CRITICAL RESULT CALLED TO, READ BACK BY AND VERIFIED WITH: RN MIKE M 1012 [id number] FCP    Staphylococcus aureus (BCID) NOT DETECTED NOT DETECTED Final   Methicillin resistance DETECTED (A) NOT DETECTED Final    Comment: CRITICAL RESULT CALLED TO, READ BACK BY AND VERIFIED WITH: RN MIKE M 1012 [id number] FCP    Streptococcus species NOT DETECTED NOT DETECTED Final   Streptococcus agalactiae NOT DETECTED NOT DETECTED Final   Streptococcus pneumoniae NOT DETECTED NOT DETECTED Final   Streptococcus pyogenes NOT DETECTED NOT DETECTED Final   Acinetobacter baumannii NOT DETECTED NOT DETECTED Final   Enterobacteriaceae species NOT DETECTED NOT DETECTED Final   Enterobacter cloacae complex NOT DETECTED NOT DETECTED Final   Escherichia coli NOT DETECTED NOT DETECTED Final   Klebsiella oxytoca NOT DETECTED NOT DETECTED Final   Klebsiella pneumoniae NOT DETECTED NOT DETECTED Final   Proteus species NOT DETECTED NOT DETECTED Final  Serratia marcescens NOT DETECTED NOT DETECTED Final   Haemophilus influenzae NOT DETECTED NOT DETECTED Final    Neisseria meningitidis NOT DETECTED NOT DETECTED Final   Pseudomonas aeruginosa NOT DETECTED NOT DETECTED Final   Candida albicans NOT DETECTED NOT DETECTED Final   Candida glabrata NOT DETECTED NOT DETECTED Final   Candida krusei NOT DETECTED NOT DETECTED Final   Candida parapsilosis NOT DETECTED NOT DETECTED Final   Candida tropicalis NOT DETECTED NOT DETECTED Final    Comment: Performed at [facility], [place] [address] [place], [place] [address]  Blood Culture (routine x 2)     Status: Abnormal   Collection Time: 05/23/20  5:10 PM   Specimen: BLOOD  Result Value Ref Range Status   Specimen Description BLOOD RIGHT ANTECUBITAL  Final   Special Requests   Final    BOTTLES DRAWN AEROBIC AND ANAEROBIC Blood Culture adequate volume   Culture  Setup Time   Final    GRAM POSITIVE COCCI IN BOTH AEROBIC AND ANAEROBIC BOTTLES    Culture (A)  Final    STAPHYLOCOCCUS HOMINIS THE SIGNIFICANCE OF ISOLATING THIS ORGANISM FROM A SINGLE SET OF BLOOD CULTURES WHEN MULTIPLE SETS ARE DRAWN IS UNCERTAIN. PLEASE NOTIFY THE MICROBIOLOGY DEPARTMENT WITHIN ONE WEEK IF SPECIATION AND SENSITIVITIES ARE REQUIRED. Performed at [facility], [place] [address] [place], [place] [address]    Report Status 05/27/2020 FINAL  Final  SARS Coronavirus 2 by RT PCR (hospital order, performed in [facility] hospital lab) Nasopharyngeal Nasopharyngeal Swab     Status: None   Collection Time: 05/23/20  7:15 PM   Specimen: Nasopharyngeal Swab  Result Value Ref Range Status   SARS Coronavirus 2 NEGATIVE NEGATIVE Final    Comment: (NOTE) SARS-CoV-2 target nucleic acids are NOT DETECTED.  The SARS-CoV-2 RNA is generally detectable in upper and lower respiratory specimens during the acute phase of infection. The lowest concentration of SARS-CoV-2 viral copies this assay can detect is 250 copies / mL. A negative result does not preclude SARS-CoV-2 infection and should not be used as the sole basis for treatment or  other patient management decisions.  A negative result may occur with improper specimen collection / handling, submission of specimen other than nasopharyngeal swab, presence of viral mutation(s) within the areas targeted by this assay, and inadequate number of viral copies (<250 copies / mL). A negative result must be combined with clinical observations, patient history, and epidemiological information.  Fact Sheet for Patients:   StrictlyIdeas.no  Fact Sheet for Healthcare Providers: BankingDealers.co.za  This test is not yet approved or  cleared by the [place] FDA and has been authorized for detection and/or diagnosis of SARS-CoV-2 by FDA under an Emergency Use Authorization (EUA).  This EUA will remain in effect (meaning this test can be used) for the duration of the COVID-19 declaration under Section 564(b)(1) of the Act, 21 U.S.C. section 360bbb-3(b)(1), unless the authorization is terminated or revoked sooner.  Performed at [facility], [place] [address], [place], [place] [address]   MRSA PCR Screening     Status: None   Collection Time: 05/23/20  9:15 PM   Specimen: Nasal Mucosa; Nasopharyngeal  Result Value Ref Range Status   MRSA by PCR NEGATIVE NEGATIVE Final    Comment:        The GeneXpert MRSA Assay (FDA approved for NASAL specimens only), is one component of a comprehensive MRSA colonization surveillance program. It is not intended to diagnose MRSA infection nor  to guide or monitor treatment for MRSA infections. Performed at [facility], [place] [address] [place], [place] [address]   Surgical PCR screen     Status: None   Collection Time: 05/25/20  9:06 AM   Specimen: Nasal Mucosa; Nasal Swab  Result Value Ref Range Status   MRSA, PCR NEGATIVE NEGATIVE Final   Staphylococcus aureus NEGATIVE NEGATIVE Final    Comment: (NOTE) The Xpert SA Assay (FDA approved for NASAL specimens in patients  42 years of age and older), is one component of a comprehensive surveillance program. It is not intended to diagnose infection nor to guide or monitor treatment. Performed at [facility], [place] [address] [place], [place] [address]   Culture, respiratory (non-expectorated)     Status: None   Collection Time: 05/26/20 11:01 AM   Specimen: Tracheal Aspirate; Respiratory  Result Value Ref Range Status   Specimen Description TRACHEAL ASPIRATE  Final   Special Requests NONE  Final   Gram Stain   Final    RARE WBC PRESENT, PREDOMINANTLY PMN RARE GRAM POSITIVE COCCI RARE GRAM POSITIVE RODS    Culture   Final    Consistent with normal respiratory flora. Performed at [facility], [place] [address], [place], [place] [address]    Report Status 05/28/2020 FINAL  Final  Culture, respiratory (non-expectorated)     Status: None   Collection Time: 05/28/20  8:07 AM   Specimen: Tracheal Aspirate; Respiratory  Result Value Ref Range Status   Specimen Description TRACHEAL ASPIRATE  Final   Special Requests NONE  Final   Gram Stain   Final    RARE WBC PRESENT,BOTH PMN AND MONONUCLEAR NO ORGANISMS SEEN    Culture   Final    RARE Consistent with normal respiratory flora. Performed at [facility], [place] [address], [place], [place] [address]    Report Status 05/30/2020 FINAL  Final  Culture, blood (Routine X 2) w Reflex to ID Panel     Status: Abnormal (Preliminary result)   Collection Time: 05/28/20  9:06 AM   Specimen: BLOOD LEFT ARM  Result Value Ref Range Status   Specimen Description BLOOD LEFT ARM  Final   Special Requests   Final    BOTTLES DRAWN AEROBIC ONLY Blood Culture adequate volume   Culture  Setup Time   Final    AEROBIC BOTTLE ONLY GRAM POSITIVE COCCI IN CLUSTERS CRITICAL VALUE NOTED.  VALUE IS CONSISTENT WITH PREVIOUSLY REPORTED AND CALLED VALUE. Performed at [facility], [place] [address] [place], [place] [address]    Culture STAPHYLOCOCCUS HOMINIS  (A)  Final   Report Status PENDING  Incomplete  Culture, blood (Routine X 2) w Reflex to ID Panel     Status: Abnormal (Preliminary result)   Collection Time: 05/28/20  9:06 AM   Specimen: BLOOD LEFT ARM  Result Value Ref Range Status   Specimen Description BLOOD LEFT ARM  Final   Special Requests   Final    BOTTLES DRAWN AEROBIC ONLY Blood Culture adequate volume   Culture  Setup Time   Final    AEROBIC BOTTLE ONLY GRAM POSITIVE COCCI CRITICAL VALUE NOTED.  VALUE IS CONSISTENT WITH PREVIOUSLY REPORTED AND CALLED VALUE. Performed at [facility], [place] [address], [place], [place] [address]    Culture STAPHYLOCOCCUS HOMINIS (A)  Final   Report Status PENDING  Incomplete  Culture, Urine     Status: None   Collection Time: 05/28/20  10:57 AM   Specimen: Urine, Random  Result Value Ref Range Status   Specimen Description URINE, RANDOM  Final   Special Requests NONE  Final   Culture   Final    NO GROWTH Performed at [facility], [address], [place], [place] [address]    Report Status 05/29/2020 FINAL  Final    Michel Bickers, MD [place] [facility] [phone] pager   [phone] cell 05/30/2020, 11:54 AM

## 2020-05-30 NOTE — Evaluation (Signed)
Clinical/Bedside Swallow Evaluation Patient Details  Name: [name] MRN: [id number] Date of Birth: 1981-07-01  Today's Date: 05/30/2020 Time: SLP Start Time (ACUTE ONLY): 5366 SLP Stop Time (ACUTE ONLY): 1335 SLP Time Calculation (min) (ACUTE ONLY): 22 min  Past Medical History:  Past Medical History:  Diagnosis Date  . Alcohol abuse   . Seizures ([place])   . Stroke [facility])    Past Surgical History:  Past Surgical History:  Procedure Laterality Date  . Above knee amputation of left leg    . BELOW KNEE LEG AMPUTATION     LEFT  . below the knee amputation    . INTRAMEDULLARY (IM) NAIL INTERTROCHANTERIC Left 05/25/2020   Procedure: INTRAMEDULLARY (IM) NAIL INTERTROCHANTRIC;  Surgeon: Shona Needles, MD;  Location: [facility];  Service: Orthopedics;  Laterality: Left;   HPI:  Pt is a 39 yo male presenting after drinking binge and then withdrawal seizures. He required intubation for airway protection 6/11; self-extuabted on 6/17. He was also found to have L hip fx s/p surgical repair 6/13. UDS positive for amphetamines. PMH includes stroke, ETOH abose, seizure disorder, BKA.    Assessment / Plan / Recommendation Clinical Impression  Pt's voice is mildly hoarse but relatively strong considering length of intubation and self-extubation on previous date. He has fluctuating cooperation during testing, improving overall as trials continued despite persistent confusion. He drank three ounces of water without stopping x2 without coughing, but he also has wet vocal quality, throat clearing, and expectoration of small amounts of puree. Given his risk factors and fluctuating mentation, would start with offering ice chips and water when fully alert and cooperative. Could also provide meds crushed in puree and small snacks of puree as mentation allows. SLP will f/u for potential to start PO diet as his mentation stabilizes.  SLP Visit Diagnosis: Dysphagia, unspecified (R13.10)    Aspiration Risk   Moderate aspiration risk    Diet Recommendation Free water protocol after oral care;Ice chips PRN after oral care   Medication Administration: Crushed with puree Supervision: Staff to assist with self feeding;Full supervision/cueing for compensatory strategies Compensations: Slow rate;Small sips/bites Postural Changes: Seated upright at 90 degrees;Remain upright for at least 30 minutes after po intake    Other  Recommendations Oral Care Recommendations: Oral care QID Other Recommendations: Have oral suction available   Follow up Recommendations  (tba)      Frequency and Duration min 2x/week  2 weeks       Prognosis Prognosis for Safe Diet Advancement: Good Barriers to Reach Goals: Cognitive deficits      Swallow Study   General HPI: Pt is a 39 yo male presenting after drinking binge and then withdrawal seizures. He required intubation for airway protection 6/11; self-extuabted on 6/17. He was also found to have L hip fx s/p surgical repair 6/13. UDS positive for amphetamines. PMH includes stroke, ETOH abose, seizure disorder, BKA.  Type of Study: Bedside Swallow Evaluation Previous Swallow Assessment: none in chart Diet Prior to this Study: NPO Temperature Spikes Noted: No Respiratory Status: Room air History of Recent Intubation: Yes Length of Intubations (days): 6 days Date extubated: 05/29/20 Behavior/Cognition: Alert;Requires cueing Oral Care Completed by SLP: No (pt refused, clenched his lips) Oral Cavity - Dentition: Adequate natural dentition Patient Positioning: Upright in bed Baseline Vocal Quality: Hoarse Volitional Cough: Strong Volitional Swallow: Able to elicit    Oral/Motor/Sensory Function Overall Oral Motor/Sensory Function: Within functional limits   Ice Chips Ice chips: Within functional limits Presentation: Spoon  Thin Liquid Thin Liquid: Impaired Presentation: Spoon Pharyngeal  Phase Impairments: Throat Clearing - Delayed;Wet Vocal Quality     Nectar Thick Nectar Thick Liquid: Not tested   Honey Thick Honey Thick Liquid: Not tested   Puree Puree: Impaired Presentation: Spoon Pharyngeal Phase Impairments:  (minimal amounts of applesauce expectorated)   Solid     Solid: Impaired Oral Phase Functional Implications: Oral residue      Osie Bond., M.A. [facility] Pager [phone] Office [phone]  05/30/2020,3:10 PM

## 2020-05-30 NOTE — Progress Notes (Addendum)
NAME:  [name], MRN:  [id number], DOB:  Sep 17, 1981, LOS: 7 ADMISSION DATE:  05/23/2020, CONSULTATION DATE: 6/11 REFERRING MD: Dr. Maryan Rued, CHIEF COMPLAINT: Altered mental status  Brief History   39 yo male with hx of ETOH and seizure disordered presented after drinking binge and then alcohol withdrawal seizures.  Required intubation for airway protection.  Found to also have Lt hip fracture.  He has been non-compliant with his medications.  UDS positive for amphetamines.  Past Medical History  CVA, Seizure  Significant Hospital Events   6/11 Admit  6/12 PRBC transfusion 6/13 off LTM, to OR for repair of left hip fx  Consults:  Neurology Ortho  Procedures:  ETT 6/11 > 6/17  Significant Diagnostic Tests:   Xray L hip 6/11  > Comminuted intertrochanteric femur fracture on the left.  CT head / Cspine 6/11 > atrophy, C4-5 and C5-6 disc space narrowing and osteophyte formation  Echo 6/13 >> EF 45 - 50%, no obvious vegetations but poor windows, consider TEE  EEG 6/13 > mod to diffuse severe encephalopathy  CT Maxillofacial noncon 6/17> severe mucosal thickening of R maxillary sinus, probable chronic sinusitis. L sided acute sinusitis   Micro Data:  Blood 6/11 > GPC >> Staph hominis, staph haemolyticus  Urine 6/11 > Staph haemolyticus  Blood 6/16 >> staph hominis  Antimicrobials:  Cefepime 6/11 >6/12, 6/16>> Vancomycin 6/11 >>   Interim history/subjective:  Remains extubated 6/18  Agitated delirium ongoing   Objective   Blood pressure 97/75, pulse 63, temperature 98.6 F (37 C), temperature source Axillary, resp. rate 16, height 5\' 8"  (1.727 m), weight 82.5 kg, SpO2 99 %.    Vent Mode: PRVC FiO2 (%):  [30 %] 30 % Set Rate:  [18 bmp] 18 bmp Vt Set:  [540 mL] 540 mL PEEP:  [5 cmH20] 5 cmH20 Plateau Pressure:  [17 cmH20] 17 cmH20   Intake/Output Summary (Last 24 hours) at 05/30/2020 0850 Last data filed at 05/30/2020 0700 Gross per 24 hour  Intake 2023.27  ml  Output 5100 ml  Net -3076.73 ml   Filed Weights   05/26/20 0500 05/27/20 0500 05/30/20 0409  Weight: 86 kg 86 kg 82.5 kg    Examination: General: Middle aged chronically ill appearing M, intermittently agitated  Neuro: Awake, agitated. Following commands.  HEENT:  R periorbital edema. Tacky pink mm. Clear secretions.  Cardiovascular RRR s1s2. Cap refill < 3 seconds  Lungs: Symmetrical chest expansion, no accessory muscle use on RA.  Abdomen: soft, round ndnt. + bowel sounds Musculoskeletal: L BKA. BUE soft restraints. RUE PICC  Skin: c/d/w without rash GU: condom cath   Assessment & Plan:   Acute respiratory failure due to airway compromise requiring intubation - resolved. Extubated 7/56   Acute metabolic encephalopathy -initial seizure due to EtOH withdrawal, no further seizure  Methamphetamine intoxication, polysubstance use disorder Agitated delirium -in setting of above + possible ICU delirium  P - CIWA, PRN Haldol.  - qtc q shift - thiamine, folic acid, MVI - TOC for substance abuse cessation - wean precedex as able   Sinusitis, chronic R sided and acute L sided P -ENT to consult  Staph hominus bacteremia P - continue vancomycin - I suspect we may need a TEE given most recent BCx data despite ongoing vanc  - ID consult  - trend WBC, Fever curve  Anemia  Thrombocytopenia, resolved  P - bid protonix - transfuse for Hb < 7 or significant bleeding. No indication at present -resuming chemical vte  ppx  Hypokalemia Hyponatremia, improved  P - K being replaced 6/18 - continue to trend  Left femur fracture - s/p surgical repair 6/13 (cephalomedullary nailing). - post op care per ortho  Inadequate PO intake -Will consult SLP in setting of self extubation  Best practice:  Diet: NPO DVT prophylaxis: SCDs, SQH GI prophylaxis: PPI Glucose control: SSI Mobility: BR Code Status: FULL Family Communication: Pending 6/18 Disposition: ICU  CRITICAL  CARE Performed by: Cristal Generous  Total critical care time: 48 minutes  Critical care time was exclusive of separately billable procedures and treating other patients. Critical care was necessary to treat or prevent imminent or life-threatening deterioration.  Critical care was time spent personally by me on the following activities: development of treatment plan with patient and/or surrogate as well as nursing, discussions with consultants, evaluation of patient's response to treatment, examination of patient, obtaining history from patient or surrogate, ordering and performing treatments and interventions, ordering and review of laboratory studies, ordering and review of radiographic studies, pulse oximetry and re-evaluation of patient's condition.   Eliseo Gum MSN, AGACNP-BC [place] [phone] If no answer, [phone] 05/30/2020, 8:50 AM

## 2020-05-31 DIAGNOSIS — B958 Unspecified staphylococcus as the cause of diseases classified elsewhere: Secondary | ICD-10-CM

## 2020-05-31 DIAGNOSIS — R7881 Bacteremia: Secondary | ICD-10-CM

## 2020-05-31 DIAGNOSIS — R5081 Fever presenting with conditions classified elsewhere: Secondary | ICD-10-CM

## 2020-05-31 LAB — AMYLASE: Amylase: 79 U/L (ref 28–100)

## 2020-05-31 LAB — HEPATIC FUNCTION PANEL
ALT: 42 U/L (ref 0–44)
AST: 130 U/L — ABNORMAL HIGH (ref 15–41)
Albumin: 2 g/dL — ABNORMAL LOW (ref 3.5–5.0)
Alkaline Phosphatase: 157 U/L — ABNORMAL HIGH (ref 38–126)
Bilirubin, Direct: 1 mg/dL — ABNORMAL HIGH (ref 0.0–0.2)
Indirect Bilirubin: 0.8 mg/dL (ref 0.3–0.9)
Total Bilirubin: 1.8 mg/dL — ABNORMAL HIGH (ref 0.3–1.2)
Total Protein: 7 g/dL (ref 6.5–8.1)

## 2020-05-31 LAB — CBC WITH DIFFERENTIAL/PLATELET
Abs Immature Granulocytes: 0.2 10*3/uL — ABNORMAL HIGH (ref 0.00–0.07)
Basophils Absolute: 0.4 10*3/uL — ABNORMAL HIGH (ref 0.0–0.1)
Basophils Relative: 2 %
Eosinophils Absolute: 0 10*3/uL (ref 0.0–0.5)
Eosinophils Relative: 0 %
HCT: 27.5 % — ABNORMAL LOW (ref 39.0–52.0)
Hemoglobin: 8.3 g/dL — ABNORMAL LOW (ref 13.0–17.0)
Lymphocytes Relative: 16 %
Lymphs Abs: 3 10*3/uL (ref 0.7–4.0)
MCH: 26.6 pg (ref 26.0–34.0)
MCHC: 30.2 g/dL (ref 30.0–36.0)
MCV: 88.1 fL (ref 80.0–100.0)
Metamyelocytes Relative: 1 %
Monocytes Absolute: 1.1 10*3/uL — ABNORMAL HIGH (ref 0.1–1.0)
Monocytes Relative: 6 %
Neutro Abs: 14.1 10*3/uL — ABNORMAL HIGH (ref 1.7–7.7)
Neutrophils Relative %: 75 %
Platelets: 231 10*3/uL (ref 150–400)
RBC: 3.12 MIL/uL — ABNORMAL LOW (ref 4.22–5.81)
RDW: 20.5 % — ABNORMAL HIGH (ref 11.5–15.5)
WBC: 18.8 10*3/uL — ABNORMAL HIGH (ref 4.0–10.5)
nRBC: 0.5 % — ABNORMAL HIGH (ref 0.0–0.2)
nRBC: 1 /100 WBC — ABNORMAL HIGH

## 2020-05-31 LAB — CULTURE, BLOOD (ROUTINE X 2): Special Requests: ADEQUATE

## 2020-05-31 LAB — GLUCOSE, CAPILLARY
Glucose-Capillary: 105 mg/dL — ABNORMAL HIGH (ref 70–99)
Glucose-Capillary: 99 mg/dL (ref 70–99)
Glucose-Capillary: 90 mg/dL (ref 70–99)
Glucose-Capillary: 112 mg/dL — ABNORMAL HIGH (ref 70–99)
Glucose-Capillary: 104 mg/dL — ABNORMAL HIGH (ref 70–99)
Glucose-Capillary: 94 mg/dL (ref 70–99)

## 2020-05-31 LAB — BASIC METABOLIC PANEL
Anion gap: 10 (ref 5–15)
BUN: 18 mg/dL (ref 6–20)
CO2: 25 mmol/L (ref 22–32)
Calcium: 8.3 mg/dL — ABNORMAL LOW (ref 8.9–10.3)
Chloride: 103 mmol/L (ref 98–111)
Creatinine, Ser: 1.17 mg/dL (ref 0.61–1.24)
GFR calc Af Amer: >60 mL/min (ref >60)
GFR calc non Af Amer: >60 mL/min (ref >60)
Glucose, Bld: 113 mg/dL — ABNORMAL HIGH (ref 70–99)
Potassium: 3.3 mmol/L — ABNORMAL LOW (ref 3.5–5.1)
Sodium: 138 mmol/L (ref 135–145)

## 2020-05-31 LAB — PHOSPHORUS: Phosphorus: 3.1 mg/dL (ref 2.5–4.6)

## 2020-05-31 LAB — LIPASE, BLOOD: Lipase: 52 U/L — ABNORMAL HIGH (ref 11–51)

## 2020-05-31 MED ORDER — FUROSEMIDE 10 MG/ML IJ SOLN
40.0000 mg | Freq: Every day | INTRAMUSCULAR | Status: DC
  Administered 2020-06-01 – 2020-06-07 (×7): 40 mg via INTRAVENOUS
  Filled 2020-05-31 (×7): qty 4

## 2020-05-31 MED ORDER — POTASSIUM CHLORIDE 10 MEQ/50ML IV SOLN
10.0000 meq | INTRAVENOUS | Status: AC
  Administered 2020-05-31 (×6): 10 meq via INTRAVENOUS
  Filled 2020-05-31 (×6): qty 50

## 2020-05-31 MED ORDER — PANTOPRAZOLE SODIUM 40 MG IV SOLR
40.0000 mg | INTRAVENOUS | Status: DC
  Administered 2020-06-01 – 2020-06-05 (×5): 40 mg via INTRAVENOUS
  Filled 2020-05-31 (×6): qty 40

## 2020-05-31 NOTE — Progress Notes (Signed)
PICC to be removed per order.  IV team consult placed to confirm this at 1419.  Passed to oncoming RN on 2W.  Pt transferred to 2W with PICC in place.

## 2020-05-31 NOTE — Progress Notes (Signed)
Patient came with alcohol withdrawal seizure, polysubstance abuse, staph hominus bacteremia extubated and downgraded to telemetry.  On IV ABX, Infectious disease following.

## 2020-05-31 NOTE — Progress Notes (Signed)
NAME:  [name], MRN:  [id number], DOB:  July 06, 1981, LOS: 8 ADMISSION DATE:  05/23/2020, CONSULTATION DATE: 6/11 REFERRING MD: Dr. Maryan Rued, CHIEF COMPLAINT: Altered mental status  Brief History   39 yo male with hx of ETOH and seizure disordered presented after drinking binge and then alcohol withdrawal seizures.  Required intubation for airway protection.  Found to also have Lt hip fracture.  He has been non-compliant with his medications.  UDS positive for amphetamines.  Past Medical History  CVA, Seizure  Significant Hospital Events   6/11 Admit  6/12 PRBC transfusion 6/13 off LTM, to OR for repair of left hip fx  Consults:  Neurology Ortho  Procedures:  ETT 6/11 > 6/17  Significant Diagnostic Tests:   Xray L hip 6/11  > Comminuted intertrochanteric femur fracture on the left.  CT head / Cspine 6/11 > atrophy, C4-5 and C5-6 disc space narrowing and osteophyte formation  Echo 6/13 >> EF 45 - 50%, no obvious vegetations but poor windows, consider TEE  EEG 6/13 > mod to diffuse severe encephalopathy  CT Maxillofacial noncon 6/17> severe mucosal thickening of R maxillary sinus, probable chronic sinusitis. L sided acute sinusitis   Micro Data:  Blood 6/11 > GPC >> Staph hominis, staph haemolyticus  Urine 6/11 > Staph haemolyticus  Blood 6/16 >> staph hominis  Antimicrobials:  Cefepime 6/11 >6/12, 6/16>> Vancomycin 6/11 >>   Interim history/subjective:   Low-grade febrile. Precedex turned off this morning. Confused wants to "go home" "do not like this hospital"  Objective   Blood pressure 101/74, pulse 78, temperature 99.4 F (37.4 C), temperature source Oral, resp. rate 18, height 5\' 8"  (1.727 m), weight 77.8 kg, SpO2 100 %.        Intake/Output Summary (Last 24 hours) at 05/31/2020 1122 Last data filed at 05/31/2020 0900 Gross per 24 hour  Intake 2057.53 ml  Output 2275 ml  Net -217.47 ml   Filed Weights   05/27/20 0500 05/30/20 0409 05/31/20 0500   Weight: 86 kg 82.5 kg 77.8 kg    Examination: General: Middle aged chronically ill appearing M, intermittently agitated  Neuro: Vest restraints and mittens, able to tell me he is in the hospital, name and address, confused, grossly nonfocal HEENT:  R periorbital edema.  Cardiovascular RRR s1s2. Cap refill < 3 seconds  Lungs: Clear breath sounds bilateral, no accessory muscle use on RA.  Abdomen: soft, round ndnt. + bowel sounds Musculoskeletal: L BKA.  RUE PICC  Skin: c/d/w without rash GU: condom cath   Labs show hypokalemia, leukocytosis  Assessment & Plan:   Acute respiratory failure due to airway compromise requiring intubation - resolved. Extubated 5/37   Acute metabolic encephalopathy -initial seizure due to EtOH withdrawal, no further seizure  Methamphetamine intoxication, polysubstance use disorder Agitated delirium -in setting of above + possible ICU delirium  P -Ativan per CIWA, PRN Haldol.  Precedex has been weaned to off - qtc q shift - thiamine, folic acid, MVI - TOC for substance abuse cessation   Sinusitis, chronic R sided and acute L sided P -ENT to consult  Staph hominus bacteremia P - continue cefepime/vancomycin - ID consulted -May need TEE eventually, DC PICC  Anemia  Thrombocytopenia, resolved  P - protonix daily - transfuse for Hb < 7 or significant bleeding. No indication at present -resuming subcu heparin  Hypokalemia Hyponatremia, improved  P -Hypokalemia being repleted  Left femur fracture - s/p surgical repair 6/13 (cephalomedullary nailing). - post op care per ortho  Inadequate PO intake -Advance per swallow evaluation  PT eval   He can transfer to progressive care and to triad 6/20  Kara Mead MD. [facility]. [facility]  If no response to pager , please call [phone]   05/31/2020

## 2020-05-31 NOTE — Progress Notes (Signed)
Pt offered assistance to eat or drink with each encounter. Pt  declined offer each time.

## 2020-05-31 NOTE — Progress Notes (Signed)
[facility] ADULT ICU REPLACEMENT PROTOCOL FOR AM LAB REPLACEMENT ONLY  The patient does apply for the [facility] Adult ICU Electrolyte Replacment Protocol based on the criteria listed below:   1. Is GFR >/= 40 ml/min? Yes.    Patient's GFR today is >60 2. Is urine output >/= 0.5 ml/kg/hr for the last 6 hours? Yes.   Patient's UOP is 1.6 ml/kg/hr 3. Is BUN < 60 mg/dL? Yes.    Patient's BUN today is 18 4. Abnormal electrolyte(s): K (3.3) 5. Ordered repletion with: Patient refusing oral replacement. Ordered KCl 69mEq IV x6.  6. If a panic level lab has been reported, has the CCM MD in charge been notified? No. - not panic. Physician:  N/a  Brain Hilts 05/31/2020 9:49 AM

## 2020-05-31 NOTE — Progress Notes (Signed)
°  Speech Language Pathology Treatment: Dysphagia  Patient Details Name: [name] MRN: [id number] DOB: 22-Nov-1981 Today's Date: 05/31/2020 Time: 6468-0321 SLP Time Calculation (min) (ACUTE ONLY): 22 min  Assessment / Plan / Recommendation Clinical Impression  Pt presents similarly as yesterday. At baseline his voice is only mildly hoarse, he is delirious but capable of following commands and participating. Pt consumed 5 oz of water consecutively x2 with slightly wet quality to voice. Occasional spontaneous throat clear resolved. Pt consumed pudding cup without incident. After eating a graham cracker appropriately and then having another 5 oz of water pt had a slight cough x2. Overall, pt appears to be protecting his airway with minimal ongoing clinical concern of silent aspiration and significant dysphagia. Pt is noted in CT  Head to have cervical osteophytes at C 5/6. 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 Though this is all conjecture, it could explain pts signs of mild dysphagia. Pt would benefit from oral intake at this point for nutrition and recovery from delirium with minimal risk of negative impact. Will initiate a regular diet and thin liquids with f/u to observe tolerance.     HPI HPI: Pt is a 39 yo male presenting after drinking binge and then withdrawal seizures. He required intubation for airway protection 6/11; self-extuabted on 6/17. He was also found to have L hip fx s/p surgical repair 6/13. UDS positive for amphetamines. PMH includes stroke, ETOH abose, seizure disorder, BKA.       SLP Plan          Recommendations  Diet recommendations: Regular;Thin liquid Liquids provided via: Cup;Straw Medication Administration: Whole meds with liquid Supervision: Staff to assist with self feeding Compensations: Slow rate;Small sips/bites Postural Changes and/or Swallow  Maneuvers: Seated upright 90 degrees                Oral Care Recommendations: Oral care QID Follow up Recommendations: 24 hour supervision/assistance SLP Visit Diagnosis: Dysphagia, unspecified (R13.10)       GO                [name], [name] 05/31/2020, 9:25 AM

## 2020-05-31 NOTE — Progress Notes (Signed)
INFECTIOUS DISEASE PROGRESS NOTE  ID: [name] is a 39 y.o. 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   Abtx:  Anti-infectives (From admission, onward)   Start     Dose/Rate Route Frequency Ordered Stop   05/30/20 0500  vancomycin (VANCOREADY) IVPB 1250 mg/250 mL     Discontinue     1,250 mg 166.7 mL/hr over 90 Minutes Intravenous Every 12 hours 05/29/20 2106     05/28/20 0930  ceFEPIme (MAXIPIME) 2 g in sodium chloride 0.9 % 100 mL IVPB     Discontinue     2 g 200 mL/hr over 30 Minutes Intravenous Every 8 hours 05/28/20 0842     05/26/20 0930  ceFAZolin (ANCEF) IVPB 2g/100 mL premix        2 g 200 mL/hr over 30 Minutes Intravenous Every 8 hours 05/26/20 0852 05/26/20 2346   05/25/20 1406  vancomycin (VANCOCIN) powder  Status:  Discontinued          As needed 05/25/20 1407 05/25/20 1508   05/24/20 0800  vancomycin (VANCOCIN) IVPB 1000 mg/200 mL premix  Status:  Discontinued        1,000 mg 200 mL/hr over 60 Minutes Intravenous Every 12 hours 05/23/20 2126 05/29/20 2106   05/23/20 2200  ceFEPIme (MAXIPIME) 2 g in sodium chloride 0.9 % 100 mL IVPB  Status:  Discontinued        2 g 200 mL/hr over 30 Minutes Intravenous Every 8 hours 05/23/20 2126 05/24/20 1047   05/23/20 1745  ceFEPIme (MAXIPIME) 2 g in sodium chloride 0.9 % 100 mL IVPB  Status:  Discontinued        2 g 200 mL/hr over 30 Minutes Intravenous  Once 05/23/20 1741 05/23/20 2125   05/23/20 1745  vancomycin (VANCOREADY) IVPB 1500 mg/300 mL        1,500 mg 150 mL/hr over 120 Minutes  Intravenous  Once 05/23/20 1741 05/23/20 2206   05/23/20 1715  cefTRIAXone (ROCEPHIN) 2 g in sodium chloride 0.9 % 100 mL IVPB  Status:  Discontinued        2 g 200 mL/hr over 30 Minutes Intravenous  Once 05/23/20 1709 05/23/20 1741   05/23/20 1615  piperacillin-tazobactam (ZOSYN) IVPB 3.375 g  Status:  Discontinued        3.375 g 100 mL/hr over 30 Minutes Intravenous  Once 05/23/20 1601 05/23/20 1741      Medications:  Scheduled: . sodium chloride   Intravenous Once  . Chlorhexidine Gluconate Cloth  6 each Topical Daily  . cholecalciferol  2,000 Units Per Tube Daily  . enoxaparin (LOVENOX) injection  40 mg Subcutaneous Q24H  . ergocalciferol  50,000 Units Per Tube Q7 days  . feeding supplement (PRO-STAT SUGAR FREE 64)  30 mL Per Tube BID  . folic acid  1 mg Per Tube Daily  . [START ON 06/01/2020] furosemide  40 mg Intravenous Daily  . mouth rinse  15 mL Mouth Rinse q morning - 10a  .  multivitamin with minerals  1 tablet Per Tube Daily  . [START ON 06/01/2020] pantoprazole (PROTONIX) IV  40 mg Intravenous Q24H  . sodium chloride flush  10-40 mL Intracatheter Q12H  . thiamine  100 mg Per Tube Daily    Objective: Vital signs in last 24 hours: Temp:  [98.4 F (36.9 C)-101.5 F (38.6 C)] 99.4 F (37.4 C) (06/19 0800) Pulse Rate:  [64-85] 78 (06/19 1100) Resp:  [14-31] 18 (06/19 1030) BP: (79-120)/(60-83) 101/74 (06/19 1100) SpO2:  [90 %-100 %] 100 % (06/19 0700) Weight:  [77.8 kg] 77.8 kg (06/19 0500)   General appearance: alert, cooperative, no distress and confused. not oriented to place Resp: clear to auscultation bilaterally Cardio: regular rate and rhythm GI: normal findings: bowel sounds normal and soft, non-tender Extremities: no corids RLE. amputation site LLE clean. RUE PIC is clean, non-tender, no cordis.   Lab Results Recent Labs    05/29/20 0526 05/29/20 2339 05/30/20 0522 05/30/20 1252 05/31/20 0621  WBC  --  15.4*  --  15.6*  --   HGB  --  8.0*  --  8.4*   --   HCT  --  26.2*  --  27.1*  --   NA   < >  --  137  --  138  K   < >  --  3.3*  --  3.3*  CL   < >  --  101  --  103  CO2   < >  --  28  --  25  BUN   < >  --  12  --  18  CREATININE   < >  --  0.94  --  1.17   < > = values in this interval not displayed.   Liver Panel Recent Labs    05/29/20 0526 05/30/20 0522  PROT 5.6* 6.5  ALBUMIN 1.7* 2.0*  AST 100* 118*  ALT 32 38  ALKPHOS 140* 143*  BILITOT 2.4* 2.3*   Sedimentation Rate No results for input(s): ESRSEDRATE in the last 72 hours. C-Reactive Protein No results for input(s): CRP in the last 72 hours.  Microbiology: Recent Results (from the past 240 hour(s))  Urine culture     Status: Abnormal   Collection Time: 05/23/20  3:13 PM   Specimen: In/Out Cath Urine  Result Value Ref Range Status   Specimen Description IN/OUT CATH URINE  Final   Special Requests   Final    NONE Performed at [facility], [address], [place], [place] [address]    Culture 5,000 COLONIES/mL STAPHYLOCOCCUS HAEMOLYTICUS (A)  Final   Report Status 05/25/2020 FINAL  Final   Organism ID, Bacteria STAPHYLOCOCCUS HAEMOLYTICUS (A)  Final      Susceptibility   Staphylococcus haemolyticus - MIC*    CIPROFLOXACIN <=0.5 SENSITIVE Sensitive     GENTAMICIN <=0.5 SENSITIVE Sensitive     NITROFURANTOIN <=16 SENSITIVE Sensitive     OXACILLIN >=4 RESISTANT Resistant     TETRACYCLINE >=16 RESISTANT Resistant     VANCOMYCIN <=0.5 SENSITIVE Sensitive     TRIMETH/SULFA <=10 SENSITIVE Sensitive     CLINDAMYCIN RESISTANT Resistant     RIFAMPIN <=0.5 SENSITIVE Sensitive     Inducible Clindamycin POSITIVE Resistant     * 5,000 COLONIES/mL STAPHYLOCOCCUS HAEMOLYTICUS  Blood Culture (routine x 2)     Status: Abnormal   Collection Time: 05/23/20  4:55 PM   Specimen: BLOOD LEFT FOREARM  Result Value Ref Range Status   Specimen  Description BLOOD LEFT FOREARM  Final   Special Requests   Final    BOTTLES DRAWN AEROBIC AND ANAEROBIC Blood Culture  adequate volume   Culture  Setup Time   Final    GRAM POSITIVE COCCI IN CLUSTERS IN BOTH AEROBIC AND ANAEROBIC BOTTLES CRITICAL RESULT CALLED TO, READ BACK BY AND VERIFIED WITH: RN MIKE M [id number] FCP    Culture (A)  Final    STAPHYLOCOCCUS HAEMOLYTICUS THE SIGNIFICANCE OF ISOLATING THIS ORGANISM FROM A SINGLE SET OF BLOOD CULTURES WHEN MULTIPLE SETS ARE DRAWN IS UNCERTAIN. PLEASE NOTIFY THE MICROBIOLOGY DEPARTMENT WITHIN ONE WEEK IF SPECIATION AND SENSITIVITIES ARE REQUIRED. Performed at [facility], [place] [address] [place], [place] [address]    Report Status 05/27/2020 FINAL  Final  Blood Culture ID Panel (Reflexed)     Status: Abnormal   Collection Time: 05/23/20  4:55 PM  Result Value Ref Range Status   Enterococcus species NOT DETECTED NOT DETECTED Final   Listeria monocytogenes NOT DETECTED NOT DETECTED Final   Staphylococcus species DETECTED (A) NOT DETECTED Final    Comment: Methicillin (oxacillin) resistant coagulase negative staphylococcus. Possible blood culture contaminant (unless isolated from more than one blood culture draw or clinical case suggests pathogenicity). No antibiotic treatment is indicated for blood  culture contaminants. CRITICAL RESULT CALLED TO, READ BACK BY AND VERIFIED WITH: RN MIKE M [id number] FCP    Staphylococcus aureus (BCID) NOT DETECTED NOT DETECTED Final   Methicillin resistance DETECTED (A) NOT DETECTED Final    Comment: CRITICAL RESULT CALLED TO, READ BACK BY AND VERIFIED WITH: RN MIKE M [id number] FCP    Streptococcus species NOT DETECTED NOT DETECTED Final   Streptococcus agalactiae NOT DETECTED NOT DETECTED Final   Streptococcus pneumoniae NOT DETECTED NOT DETECTED Final   Streptococcus pyogenes NOT DETECTED NOT DETECTED Final   Acinetobacter baumannii NOT DETECTED NOT DETECTED Final   Enterobacteriaceae species NOT DETECTED NOT DETECTED Final   Enterobacter cloacae complex NOT DETECTED NOT DETECTED Final   Escherichia coli  NOT DETECTED NOT DETECTED Final   Klebsiella oxytoca NOT DETECTED NOT DETECTED Final   Klebsiella pneumoniae NOT DETECTED NOT DETECTED Final   Proteus species NOT DETECTED NOT DETECTED Final   Serratia marcescens NOT DETECTED NOT DETECTED Final   Haemophilus influenzae NOT DETECTED NOT DETECTED Final   Neisseria meningitidis NOT DETECTED NOT DETECTED Final   Pseudomonas aeruginosa NOT DETECTED NOT DETECTED Final   Candida albicans NOT DETECTED NOT DETECTED Final   Candida glabrata NOT DETECTED NOT DETECTED Final   Candida krusei NOT DETECTED NOT DETECTED Final   Candida parapsilosis NOT DETECTED NOT DETECTED Final   Candida tropicalis NOT DETECTED NOT DETECTED Final    Comment: Performed at [facility], [address], [place], [place] [address]  Blood Culture (routine x 2)     Status: Abnormal   Collection Time: 05/23/20  5:10 PM   Specimen: BLOOD  Result Value Ref Range Status   Specimen Description BLOOD RIGHT ANTECUBITAL  Final   Special Requests   Final    BOTTLES DRAWN AEROBIC AND ANAEROBIC Blood Culture adequate volume   Culture  Setup Time   Final    GRAM POSITIVE COCCI IN BOTH AEROBIC AND ANAEROBIC BOTTLES    Culture (A)  Final    STAPHYLOCOCCUS HOMINIS THE SIGNIFICANCE OF ISOLATING THIS ORGANISM FROM A SINGLE SET OF BLOOD CULTURES WHEN MULTIPLE SETS ARE DRAWN IS UNCERTAIN. PLEASE NOTIFY THE MICROBIOLOGY DEPARTMENT WITHIN ONE WEEK IF  SPECIATION AND SENSITIVITIES ARE REQUIRED. Performed at [facility], [place] [address] [place], [place] [address]    Report Status 05/27/2020 FINAL  Final  SARS Coronavirus 2 by RT PCR (hospital order, performed in [facility] hospital lab) Nasopharyngeal Nasopharyngeal Swab     Status: None   Collection Time: 05/23/20  7:15 PM   Specimen: Nasopharyngeal Swab  Result Value Ref Range Status   SARS Coronavirus 2 NEGATIVE NEGATIVE Final    Comment: (NOTE) SARS-CoV-2 target nucleic acids are NOT DETECTED.  The SARS-CoV-2 RNA is  generally detectable in upper and lower respiratory specimens during the acute phase of infection. The lowest concentration of SARS-CoV-2 viral copies this assay can detect is 250 copies / mL. A negative result does not preclude SARS-CoV-2 infection and should not be used as the sole basis for treatment or other patient management decisions.  A negative result may occur with improper specimen collection / handling, submission of specimen other than nasopharyngeal swab, presence of viral mutation(s) within the areas targeted by this assay, and inadequate number of viral copies (<250 copies / mL). A negative result must be combined with clinical observations, patient history, and epidemiological information.  Fact Sheet for Patients:   StrictlyIdeas.no  Fact Sheet for Healthcare Providers: BankingDealers.co.za  This test is not yet approved or  cleared by the [place] FDA and has been authorized for detection and/or diagnosis of SARS-CoV-2 by FDA under an Emergency Use Authorization (EUA).  This EUA will remain in effect (meaning this test can be used) for the duration of the COVID-19 declaration under Section 564(b)(1) of the Act, 21 U.S.C. section 360bbb-3(b)(1), unless the authorization is terminated or revoked sooner.  Performed at [facility], [place] [address] [place], [place] [address]   MRSA PCR Screening     Status: None   Collection Time: 05/23/20  9:15 PM   Specimen: Nasal Mucosa; Nasopharyngeal  Result Value Ref Range Status   MRSA by PCR NEGATIVE NEGATIVE Final    Comment:        The GeneXpert MRSA Assay (FDA approved for NASAL specimens only), is one component of a comprehensive MRSA colonization surveillance program. It is not intended to diagnose MRSA infection nor to guide or monitor treatment for MRSA infections. Performed at [facility], [place] [address] [place], [place] [address]   Surgical  PCR screen     Status: None   Collection Time: 05/25/20  9:06 AM   Specimen: Nasal Mucosa; Nasal Swab  Result Value Ref Range Status   MRSA, PCR NEGATIVE NEGATIVE Final   Staphylococcus aureus NEGATIVE NEGATIVE Final    Comment: (NOTE) The Xpert SA Assay (FDA approved for NASAL specimens in patients 77 years of age and older), is one component of a comprehensive surveillance program. It is not intended to diagnose infection nor to guide or monitor treatment. Performed at [facility], [place] [address], [place], [place] [address]   Culture, respiratory (non-expectorated)     Status: None   Collection Time: 05/26/20 11:01 AM   Specimen: Tracheal Aspirate; Respiratory  Result Value Ref Range Status   Specimen Description TRACHEAL ASPIRATE  Final   Special Requests NONE  Final   Gram Stain   Final    RARE WBC PRESENT, PREDOMINANTLY PMN RARE GRAM POSITIVE COCCI RARE GRAM POSITIVE RODS    Culture   Final    Consistent with normal respiratory flora. Performed at [facility], [place] [address]  [place], [place] [address]    Report Status 05/28/2020 FINAL  Final  Culture, respiratory (non-expectorated)     Status: None   Collection Time: 05/28/20  8:07 AM   Specimen: Tracheal Aspirate; Respiratory  Result Value Ref Range Status   Specimen Description TRACHEAL ASPIRATE  Final   Special Requests NONE  Final   Gram Stain   Final    RARE WBC PRESENT,BOTH PMN AND MONONUCLEAR NO ORGANISMS SEEN    Culture   Final    RARE Consistent with normal respiratory flora. Performed at [facility], [place] [address], [place], [place] [address]    Report Status 05/30/2020 FINAL  Final  Culture, blood (Routine X 2) w Reflex to ID Panel     Status: Abnormal   Collection Time: 05/28/20  9:06 AM   Specimen: BLOOD LEFT ARM  Result Value Ref Range Status   Specimen Description BLOOD LEFT ARM  Final   Special Requests   Final    BOTTLES DRAWN AEROBIC ONLY Blood Culture adequate volume    Culture  Setup Time   Final    AEROBIC BOTTLE ONLY GRAM POSITIVE COCCI IN CLUSTERS CRITICAL VALUE NOTED.  VALUE IS CONSISTENT WITH PREVIOUSLY REPORTED AND CALLED VALUE. Performed at [facility], [place] [address] [place], [place] [address]    Culture STAPHYLOCOCCUS HOMINIS (A)  Final   Report Status 05/31/2020 FINAL  Final   Organism ID, Bacteria STAPHYLOCOCCUS HOMINIS  Final      Susceptibility   Staphylococcus hominis - MIC*    CIPROFLOXACIN <=0.5 SENSITIVE Sensitive     ERYTHROMYCIN >=8 RESISTANT Resistant     GENTAMICIN <=0.5 SENSITIVE Sensitive     OXACILLIN >=4 RESISTANT Resistant     TETRACYCLINE >=16 RESISTANT Resistant     VANCOMYCIN <=0.5 SENSITIVE Sensitive     TRIMETH/SULFA 80 RESISTANT Resistant     CLINDAMYCIN 0.5 SENSITIVE Sensitive     RIFAMPIN <=0.5 SENSITIVE Sensitive     Inducible Clindamycin NEGATIVE Sensitive     * STAPHYLOCOCCUS HOMINIS  Culture, blood (Routine X 2) w Reflex to ID Panel     Status: Abnormal   Collection Time: 05/28/20  9:06 AM   Specimen: BLOOD LEFT ARM  Result Value Ref Range Status   Specimen Description BLOOD LEFT ARM  Final   Special Requests   Final    BOTTLES DRAWN AEROBIC ONLY Blood Culture adequate volume   Culture  Setup Time   Final    AEROBIC BOTTLE ONLY GRAM POSITIVE COCCI CRITICAL VALUE NOTED.  VALUE IS CONSISTENT WITH PREVIOUSLY REPORTED AND CALLED VALUE.    Culture (A)  Final    STAPHYLOCOCCUS HOMINIS SUSCEPTIBILITIES PERFORMED ON PREVIOUS CULTURE WITHIN THE LAST 5 DAYS. Performed at [facility], [place] [address], [place], [place] [address]    Report Status 05/31/2020 FINAL  Final  Culture, Urine     Status: None   Collection Time: 05/28/20 10:57 AM   Specimen: Urine, Random  Result Value Ref Range Status   Specimen Description URINE, RANDOM  Final   Special Requests NONE  Final   Culture   Final    NO GROWTH Performed at [facility], [place] [address], [place], [place] [address]    Report Status  05/29/2020 FINAL  Final  Culture, blood (routine x 2)     Status: None (Preliminary result)   Collection Time: 05/30/20 12:52 PM   Specimen: BLOOD LEFT ARM  Result Value Ref Range Status   Specimen  Description BLOOD LEFT ARM  Final   Special Requests   Final    BOTTLES DRAWN AEROBIC AND ANAEROBIC Blood Culture adequate volume   Culture   Final    NO GROWTH < 24 HOURS Performed at [facility], [address] [place], [place] [address]    Report Status PENDING  Incomplete  Culture, blood (routine x 2)     Status: None (Preliminary result)   Collection Time: 05/30/20 12:55 PM   Specimen: BLOOD LEFT ARM  Result Value Ref Range Status   Specimen Description BLOOD LEFT ARM  Final   Special Requests   Final    BOTTLES DRAWN AEROBIC AND ANAEROBIC Blood Culture adequate volume   Culture   Final    NO GROWTH < 24 HOURS Performed at [facility], [place] [address] [place], [place] [address]    Report Status PENDING  Incomplete    Studies/Results: No results found.   Assessment/Plan: ETOH abuse Seizures L hip IM nail post fall (6-11) R maxiallry sinusitis L sphenoid sinusitis 6-16 (staph hominis R-ox) 2/4 6-11 (staph hominis) 2/2 6-11 (staph haemolyticus) 2/2 6-11 UCx (staph haemolyticus)  Total days of antibiotics:  9 vanco 4 cefepime  Continue his current anbx Check amylase/lipase , LFTs He has multiple possible etiologies for his fevers- bacteremia, sinusitis, seizures, w/d (although now 8 days out). His temps had improved until this AM.   will continue to watch him on his current anbx, aim for 14 days of vanco.  If fevers persist, consider line holiday?   appreciate pharm supplementing K+      Bobby Rumpf MD, FACP Infectious Diseases (pager) [phone] [email or web address] 05/31/2020, 11:24 AM  LOS: 8 days

## 2020-05-31 NOTE — Progress Notes (Signed)
PT Cancellation Note  Patient Details Name: [name] MRN: [id number] DOB: 1981/10/04   Cancelled Treatment:    Reason Eval/Treat Not Completed: Patient at procedure or test/unavailable. Upon PT arrival pt leaving room to transfer units. PT will follow up as time allows.   Zenaida Niece 05/31/2020, 4:02 PM

## 2020-06-01 ENCOUNTER — Other Ambulatory Visit: Payer: Self-pay

## 2020-06-01 DIAGNOSIS — J0130 Acute sphenoidal sinusitis, unspecified: Secondary | ICD-10-CM

## 2020-06-01 DIAGNOSIS — F1010 Alcohol abuse, uncomplicated: Secondary | ICD-10-CM

## 2020-06-01 DIAGNOSIS — R410 Disorientation, unspecified: Secondary | ICD-10-CM

## 2020-06-01 DIAGNOSIS — Z8781 Personal history of (healed) traumatic fracture: Secondary | ICD-10-CM

## 2020-06-01 LAB — MAGNESIUM: Magnesium: 1.7 mg/dL (ref 1.7–2.4)

## 2020-06-01 LAB — BASIC METABOLIC PANEL
Anion gap: 9 (ref 5–15)
BUN: 18 mg/dL (ref 6–20)
CO2: 24 mmol/L (ref 22–32)
Calcium: 8.3 mg/dL — ABNORMAL LOW (ref 8.9–10.3)
Chloride: 105 mmol/L (ref 98–111)
Creatinine, Ser: 1.06 mg/dL (ref 0.61–1.24)
GFR calc Af Amer: >60 mL/min (ref >60)
GFR calc non Af Amer: >60 mL/min (ref >60)
Glucose, Bld: 101 mg/dL — ABNORMAL HIGH (ref 70–99)
Potassium: 3.6 mmol/L (ref 3.5–5.1)
Sodium: 138 mmol/L (ref 135–145)

## 2020-06-01 LAB — GLUCOSE, CAPILLARY
Glucose-Capillary: 102 mg/dL — ABNORMAL HIGH (ref 70–99)
Glucose-Capillary: 105 mg/dL — ABNORMAL HIGH (ref 70–99)
Glucose-Capillary: 106 mg/dL — ABNORMAL HIGH (ref 70–99)
Glucose-Capillary: 111 mg/dL — ABNORMAL HIGH (ref 70–99)
Glucose-Capillary: 123 mg/dL — ABNORMAL HIGH (ref 70–99)
Glucose-Capillary: 96 mg/dL (ref 70–99)
Glucose-Capillary: 96 mg/dL (ref 70–99)

## 2020-06-01 LAB — PHOSPHORUS: Phosphorus: 2.6 mg/dL (ref 2.5–4.6)

## 2020-06-01 MED ORDER — MAGNESIUM SULFATE 2 GM/50ML IV SOLN
2.0000 g | Freq: Once | INTRAVENOUS | Status: AC
  Administered 2020-06-01: 2 g via INTRAVENOUS
  Filled 2020-06-01: qty 50

## 2020-06-01 MED ORDER — LORAZEPAM 2 MG/ML IJ SOLN
1.0000 mg | INTRAMUSCULAR | Status: AC | PRN
  Administered 2020-06-01: 3 mg via INTRAVENOUS
  Administered 2020-06-01 – 2020-06-02 (×6): 2 mg via INTRAVENOUS
  Administered 2020-06-02: 3 mg via INTRAVENOUS
  Filled 2020-06-01: qty 1
  Filled 2020-06-01: qty 2
  Filled 2020-06-01: qty 1
  Filled 2020-06-01: qty 2
  Filled 2020-06-01 (×4): qty 1

## 2020-06-01 MED ORDER — PNEUMOCOCCAL VAC POLYVALENT 25 MCG/0.5ML IJ INJ: 0.5000 mL | INJECTION | INTRAMUSCULAR | Status: DC

## 2020-06-01 MED ORDER — LORAZEPAM 1 MG PO TABS
1.0000 mg | ORAL_TABLET | ORAL | Status: AC | PRN
  Administered 2020-06-03 (×2): 2 mg via ORAL
  Filled 2020-06-01 (×2): qty 2
  Filled 2020-06-01: qty 1

## 2020-06-01 NOTE — Progress Notes (Signed)
Pharmacy Antibiotic Note  [name] is a 39 y.o. male admitted on 05/23/2020 with AMS.  Pharmacy has been consulted for vancomycin and cefepime dosing. Blood cultures on admission grew 2/4 staph hominis and 2/4 staph haemolyticus. Urine culture also positive for staph haemolyticus/ Patient has been receiving vancomycin 1250mg  IV q12h and cefepime 2g IV q8h. tmax 100.2. Wbc 18.8 on 6/19. Scr 1.06 with BL Scr ~ 0.8 - 0.9. UOP 0.5 ml/kg/hr.   Plan: Continue vancomycin 1250mg  IV q12h  Repeat vancomycin trough weekly as appropriate based on length of therapy  Continue cefepime 2g IV q8h     Temp (24hrs), Avg:99.4 F (37.4 C), Min:98.4 F (36.9 C), Max:100.2 F (37.9 C)  Recent Labs  Lab 05/26/20 0704 05/27/20 0741 05/28/20 1337 05/29/20 0526 05/29/20 1930 05/29/20 2339 05/30/20 0522 05/30/20 1252 05/31/20 0621 05/31/20 1200 06/01/20 0539  WBC 9.7   < > 10.2 12.8*  --  15.4*  --  15.6*  --  18.8*  --   CREATININE 1.04   < > 0.85 0.84  --   --  0.94  --  1.17  --  1.06  LATICACIDVEN  --   --   --  1.2  --   --   --   --   --   --   --   VANCOTROUGH 16  --   --   --  13*  --   --   --   --   --   --    < > = values in this interval not displayed.    Estimated Creatinine Clearance: 98 mL/min (by C-G formula based on SCr of 1.06 mg/dL).    No Known Allergies  Antimicrobials this admission: Cefepime 6/11 >> 6/12, 6/16 >> Vanc 6/11 >>   Vancomycin levels this admission: 6/14 VT = 16  6/17 VT = 13 mcg/mL on 1g q12 >> 1250mg  q12  Cultures/Sensitivities this admission: 6/18 bcx: ngtd  6/16 BCx: staph homonis 6/16 TA: ngtd 6/14 TA: normal flora 6/11 BCx: 1 set staph hominis; 1 set staph haemolyticus  6/11 UCx: (in/out cath): 5k col staph haemolyticus 6/11 mrsa pcr - neg  Cristela Felt, PharmD PGY1 Pharmacy Resident [employer]: [phone]  06/01/2020, 9:15 AM

## 2020-06-01 NOTE — Evaluation (Signed)
Physical Therapy Evaluation Patient Details Name: [name] MRN: [id number] DOB: 17-Jan-1981 Today's Date: 06/01/2020   History of Present Illness  Pt 39 y.o. male admitted on 05/23/20 for recent fall resulting in L femur fx and seizures.  CT head 05/23/20 atrophy. Intubated 05/23/20 in ED-extubated on 05/29/20. s/p L hip IM nail surgery 05/25/20. EEG 05/25/20 mod to diffuse severe encephalopathy. CT Maxillofacial 05/29/20  severe mucosal thickening of R maxillary sinus, probable chronic sinusitis, L sided acute sinusitis. PMH L BKA, ETOH use, seizures.  Clinical Impression  Pt presents with an overall decrease in functional mobility, decrease in cognition, decrease in balance and generalized weakness secondary to above. PTA, pt lives with girlfriend and is currently on disability. Pt required verbal and tactile cues and consistent redirection to complete mobility.Today, pt able to sit EOB with max(A)+2 and was unable to maintain sitting balance at EOB. Discussed d/c recommendations for CIR with sister, who agreed. Pt would benefit from continued acute PT services to maximize functional mobility and independence prior to d/c to next venue of care.     Follow Up Recommendations CIR    Equipment Recommendations  Other (comment) (tbd)    Recommendations for Other Services OT consult;Rehab consult     Precautions / Restrictions Precautions Precautions: Fall Restrictions Weight Bearing Restrictions: Yes LLE Weight Bearing: Weight bearing as tolerated      Mobility  Bed Mobility Overal bed mobility: Needs Assistance Bed Mobility: Supine to Sit;Sit to Supine     Supine to sit: +2 for physical assistance;Max assist Sit to supine: +2 for physical assistance;Max assist   General bed mobility comments: Pt required +2 max(A) for bed mobility and to sit EOB, Pt was unable to hold himself up EOB and required mod(A)-Max(A) for trunkal support.  Transfers                 General  transfer comment: unable  Ambulation/Gait             General Gait Details: unable  Stairs            Wheelchair Mobility    Modified Rankin (Stroke Patients Only)       Balance Overall balance assessment: Needs assistance   Sitting balance-Leahy Scale: Poor Sitting balance - Comments: Pt required consistent support to sit EOB, pt unable to hold himself up with verbal and tactile cues prompting Postural control: Posterior lean;Right lateral lean;Left lateral lean                                   Pertinent Vitals/Pain Pain Assessment: Faces Faces Pain Scale: Hurts little more Pain Location: LLE Pain Descriptors / Indicators: Grimacing;Discomfort;Guarding Pain Intervention(s): Limited activity within patient's tolerance;Monitored during session    Home Living Family/patient expects to be discharged to:: Private residence Living Arrangements: Spouse/significant other   Type of Home: House Home Access: Level entry              Prior Function           Comments: unable to glean home information and PLOF from pt due to AMS, pt sister reports he lives with girlfriend, he is on disability and likes to play spades.     Hand Dominance        Extremity/Trunk Assessment   Upper Extremity Assessment Upper Extremity Assessment: Defer to OT evaluation    Lower Extremity Assessment Lower Extremity Assessment: RLE deficits/detail;LLE deficits/detail RLE Deficits /  Details: Bilateral LE bilaterally strong, pt unable to follow commands to formally test, observed strength with pt moving around in bed and mobility with sitting EOB. RLE:  (unable to fully assess due to cognition) LLE Deficits / Details: Bilateral LE bilaterally strong, pt unable to follow commands to formally test, observed strength with pt moving around in bed and mobility with sitting EOB. LLE: Unable to fully assess due to pain (unable to fully assess due to cognition)     Cervical / Trunk Assessment Cervical / Trunk Assessment: Kyphotic  Communication   Communication: Other (comment) (not oriented, confused)  Cognition Arousal/Alertness: Awake/alert Behavior During Therapy: WFL for tasks assessed/performed Overall Cognitive Status: History of cognitive impairments - at baseline Area of Impairment: Orientation;Attention;Memory;Following commands;Safety/judgement;Awareness;Problem solving                 Orientation Level: Disoriented to;Place;Time;Situation Current Attention Level: Focused Memory: Decreased short-term memory;Decreased recall of precautions Following Commands: Follows one step commands with increased time;Follows one step commands inconsistently Safety/Judgement: Decreased awareness of safety;Decreased awareness of deficits Awareness: Intellectual Problem Solving: Slow processing;Decreased initiation;Requires verbal cues;Difficulty sequencing;Requires tactile cues General Comments: Pt presents with a history of cogntive deficits at baseline. Pt required consistent verbal and tactile cuing for mobility and task following. Pt thought he was in "grocery store", "dining room", "living room" during session. Pt sister present and reports he started having AMS after L BKA.      General Comments General comments (skin integrity, edema, etc.): Skin integ appeared WNL, VSS, pt sister in room during session.    Exercises     Assessment/Plan    PT Assessment Patient needs continued PT services  PT Problem List Decreased strength;Decreased mobility;Decreased safety awareness;Decreased coordination;Decreased range of motion;Decreased knowledge of precautions;Decreased activity tolerance;Decreased cognition;Decreased balance;Decreased knowledge of use of DME;Pain       PT Treatment Interventions DME instruction;Therapeutic activities;Cognitive remediation;Gait training;Patient/family education;Therapeutic exercise;Stair training;Balance  training;Functional mobility training;Neuromuscular re-education    PT Goals (Current goals can be found in the Care Plan section)  Acute Rehab PT Goals PT Goal Formulation: Patient unable to participate in goal setting Time For Goal Achievement: 06/15/20    Frequency Min 5X/week   Barriers to discharge   unsure what pt home set up is like or what caregiver support is avaible based on eval due to pt cognition    Co-evaluation               AM-PAC PT "6 Clicks" Mobility  Outcome Measure Help needed turning from your back to your side while in a flat bed without using bedrails?: A Lot Help needed moving from lying on your back to sitting on the side of a flat bed without using bedrails?: Total Help needed moving to and from a bed to a chair (including a wheelchair)?: Total Help needed standing up from a chair using your arms (e.g., wheelchair or bedside chair)?: Total Help needed to walk in hospital room?: Total Help needed climbing 3-5 steps with a railing? : Total 6 Click Score: 7    End of Session   Activity Tolerance: Other (comment) (limited by cognition) Patient left: in bed;with call bell/phone within reach;with chair alarm set Nurse Communication: Mobility status PT Visit Diagnosis: Unsteadiness on feet (R26.81);Other abnormalities of gait and mobility (R26.89);Muscle weakness (generalized) (M62.81);Difficulty in walking, not elsewhere classified (R26.2)    Time: 9628-3662 PT Time Calculation (min) (ACUTE ONLY): 25 min   Charges:   PT Evaluation $PT Eval Moderate Complexity: 1 Mod PT Treatments $  Therapeutic Activity: 8-22 mins        Rolland Porter SPT 06/01/2020   Rolland Porter 06/01/2020, 12:50 PM

## 2020-06-01 NOTE — Progress Notes (Signed)
Inpatient Rehab Admissions Coordinator Note:   Per PT recommendation, pt was screened for CIR candidacy by Gayland Curry, MS, CCC-SLP.  At this time we are recommending an Inpatient Rehab consult.  AC will place consult order per protocol.  Please contact me with questions.   Gayland Curry, MS, CCC-SLP Admissions Coordinator [phone] 06/01/20 1:28 PM

## 2020-06-01 NOTE — Evaluation (Signed)
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   Fever 05/30/2020   Positive blood culture 05/30/2020   Sinusitis 05/30/2020   Delirium 05/30/2020   Normocytic anemia 05/30/2020   Encephalopathy acute    Acute hypoxemic respiratory failure ([place])    Elective surgery    S/p left hip fracture    Respiratory failure ([place])    Seizure ([place]) 05/23/2020   Sepsis ([place]) 06/05/2016   Thrombocytopenia ([place]) 06/05/2016   Alcohol abuse with intoxication ([place]) 06/05/2016   Fatty liver 06/05/2016   Asthma 06/05/2016   Fall at home 05/03/2012   postural orthostatic tachycardia 05/03/2012   Hypokalemia 05/03/2012   Steatosis of liver 05/03/2012   Gallstones 05/03/2012   Thoracic ascending aortic aneurysm ([place]) 05/03/2012   History of mental retardation 05/03/2012   Tobacco abuse 05/03/2012   Syncope 04/27/2012   Alcohol abuse 04/27/2012   Hx of BKA, left ([place]) 04/27/2012   Past Medical History:  Past Medical History:  Diagnosis Date   Alcohol abuse    Seizures ([place])    Stroke ([place])    Past Surgical History:  Past Surgical History:  Procedure Laterality Date   Above knee amputation of left leg     BELOW KNEE LEG AMPUTATION     LEFT   below the knee amputation     INTRAMEDULLARY (IM) NAIL INTERTROCHANTERIC Left 05/25/2020   Procedure: INTRAMEDULLARY (IM) NAIL INTERTROCHANTRIC;  Surgeon: Shona Needles, MD;  Location: [facility];  Service: Orthopedics;  Laterality: Left;   HPI:  Pt is a 39 yo male presenting after drinking binge and then withdrawal seizures. He required intubation for airway protection 6/11; self-extuabted on 6/17. He was also found to have L hip fx s/p surgical repair 6/13. UDS positive  for amphetamines. PMH includes stroke, ETOH abose, seizure disorder, BKA.    Assessment / Plan / Recommendation Clinical Impression   Pt presents with severe global cognitive deficits.  He is oriented to self only, although he was able to say one time that he was in the hospital for seizures before becoming disoriented to place and situation again.  Pt was hallucinating throughout evaluation and thought that there was another person in his room besides SLP.  He required max assist/choice of three to recall 1 out of 4 words on a basic assessment of delayed recall.  His memory deficits are likely at least partially attributable to delirium and fleeting attention to tasks.  As a result, pt would benefit from [place] while inpatient in order to maximize functional independence and reduce burden of care prior to discharge.  Anticipate that pt will need 24/7 supervision at discharge in addition to [place] follow up at next level of care.      SLP Assessment  SLP Recommendation/Assessment: Patient needs continued Speech Lanaguage Pathology Services SLP Visit Diagnosis: Cognitive communication deficit (R41.841)    Follow Up Recommendations  24 hour supervision/assistance    Frequency and Duration min 2x/week         SLP Evaluation Cognition  Overall Cognitive Status: Impaired/Different from baseline Arousal/Alertness: Awake/alert Orientation Level: Oriented to person Attention: Sustained Sustained Attention: Impaired Sustained Attention Impairment: Functional basic;Verbal basic Memory: Impaired Memory Impairment: Storage deficit Awareness: Impaired Awareness Impairment: Intellectual impairment       Comprehension  Auditory Comprehension  Overall Auditory Comprehension: Impaired    Expression Expression Primary Mode of Expression: Verbal Verbal Expression Overall Verbal Expression: Appears within functional limits for tasks assessed   Oral / Motor  Oral Motor/Sensory Function Overall Oral  Motor/Sensory Function: Within functional limits Motor Speech Overall Motor Speech: Appears within functional limits for tasks assessed   GO                    Epifania Littrell, Selinda Orion 06/01/2020, 4:08 PM

## 2020-06-01 NOTE — Progress Notes (Addendum)
PROGRESS NOTE  [name] OVZ:[id number] DOB: 01-13-1981 DOA: 05/23/2020 PCP: [facility], [facility]  Brief History   The patient is a 39 yr old man who was admitted from the ED on 05/23/2020 after several days of seizures and a fall at home. While in the ED the patient became confused and agitated requiring 6 mg of Ativan.  This was due to ETOH withdrawal and methamphetamine intoxication. Neurology was consulted and an EEG performed. 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 There were no seizures on EEG. He was started on Keppra 750 bid.  He was also found to have suffered a left femur fracture, and rhabdomyolysis. He was admitted to [facility] and underwent surgery on 05/24/2020. The patient required intubation for airway protection on admission. He self-extubated on 05/30/2020. Sedation while on vent was difficult. Infectious disease has been consulted. The patient has received 10 days of vancomycin and 5 days of cefepime. Maxillofacial CT was performed and the patient was evaluated by ENT for possibly orbitasl fracture. The patient was also found to have left sided acute sinusitis. ENT recommended antibiotic treatment. The patient was transferred out of the ICU yesterday. He continues to suffer from delirium. SLP has been consulted to perform a cognitive eval. He has been cleared for a regular diet with thin liquids. He has been non-compliant with medications. He remains on a CIWA protocol.   2/2 Blood cultures obtained on 05/23/2020 have grown out staph heamolyticus. Repeat cultures drawn on 05/28/2020 grew out  Staph hominus. Repeat cultures drawn on 05/30/2020 have had no growth as of yet.  Infectious disease has been consulted. The patient has received 10 days of vancomycin and 5 days of cefepime.   Since the patient has come out of the ICU he has remained delirious. ID and PT have both recommended SLP  eval for cognition and language. The patient is WBAT on his left lower extremity. PT/OT has arrived to work with the patient.   Consultants  . Infectious disease . PCCM . Neurology  Procedures  . Mechanical ventilation . EEG . Intermedullary nailing of femur fracture  Antibiotics   Anti-infectives (From admission, onward)   Start     Dose/Rate Route Frequency Ordered Stop   05/30/20 0500  vancomycin (VANCOREADY) IVPB 1250 mg/250 mL     Discontinue     1,250 mg 166.7 mL/hr over 90 Minutes Intravenous Every 12 hours 05/29/20 2106     05/28/20 0930  ceFEPIme (MAXIPIME) 2 g in sodium chloride 0.9 % 100 mL IVPB     Discontinue     2 g 200 mL/hr over 30 Minutes Intravenous Every 8 hours 05/28/20 0842     05/26/20 0930  ceFAZolin (ANCEF) IVPB 2g/100 mL premix        2 g 200 mL/hr over 30 Minutes Intravenous Every 8 hours 05/26/20 0852 05/26/20 2346   05/25/20 1406  vancomycin (VANCOCIN) powder  Status:  Discontinued          As needed 05/25/20 1407 05/25/20 1508   05/24/20 0800  vancomycin (VANCOCIN) IVPB 1000 mg/200 mL premix  Status:  Discontinued        1,000 mg 200 mL/hr over 60 Minutes Intravenous Every 12 hours 05/23/20 2126 05/29/20 2106   05/23/20 2200  ceFEPIme (MAXIPIME) 2 g in sodium chloride 0.9 % 100 mL IVPB  Status:  Discontinued        2 g 200 mL/hr  over 30 Minutes Intravenous Every 8 hours 05/23/20 2126 05/24/20 1047   05/23/20 1745  ceFEPIme (MAXIPIME) 2 g in sodium chloride 0.9 % 100 mL IVPB  Status:  Discontinued        2 g 200 mL/hr over 30 Minutes Intravenous  Once 05/23/20 1741 05/23/20 2125   05/23/20 1745  vancomycin (VANCOREADY) IVPB 1500 mg/300 mL        1,500 mg 150 mL/hr over 120 Minutes Intravenous  Once 05/23/20 1741 05/23/20 2206   05/23/20 1715  cefTRIAXone (ROCEPHIN) 2 g in sodium chloride 0.9 % 100 mL IVPB  Status:  Discontinued        2 g 200 mL/hr over 30 Minutes Intravenous  Once 05/23/20 1709 05/23/20 1741   05/23/20 1615   piperacillin-tazobactam (ZOSYN) IVPB 3.375 g  Status:  Discontinued        3.375 g 100 mL/hr over 30 Minutes Intravenous  Once 05/23/20 1601 05/23/20 1741    .  Subjective  The patient is resting comfortably. He is mumbling gibberish and is wearing restraints. He is oppositional.  Objective   Vitals:  Vitals:   06/01/20 0801 06/01/20 1252  BP: (!) 120/103 103/71  Pulse: 95 (!) 102  Resp: (!) 23 20  Temp: 98.9 F (37.2 C) 98.8 F (37.1 C)  SpO2: 100% 100%   Exam:  Constitutional:  . The patient is awake, alert, but confused. No acute distress. Respiratory:  . No increased work of breathing. . No wheezes, rales, or rhonchi . No tactile fremitus Cardiovascular:  . Regular rate and rhythm . No murmurs, ectopy, or gallups. . No lateral PMI. No thrills. Abdomen:  . Abdomen is soft, non-tender, non-distended . No hernias, masses, or organomegaly . Normoactive bowel sounds.  Musculoskeletal:  . No cyanosis, clubbing, or edema Skin:  . No rashes, lesions, ulcers . palpation of skin: no induration or nodules Neurologic:  Unable to evaluate as the patient is unable to cooperate with exam. Psychiatric:  . Unable to evaluate as the patient is unable to cooperate with exam.  I have personally reviewed the following:   Today's Data  . Vitals, BMP  Micro Data  . Blood cultures 6/11, 16, and 18.  Imaging  . Maxillofacial CT  Other Data  . EEG  Scheduled Meds: . sodium chloride   Intravenous Once  . Chlorhexidine Gluconate Cloth  6 each Topical Daily  . cholecalciferol  2,000 Units Per Tube Daily  . enoxaparin (LOVENOX) injection  40 mg Subcutaneous Q24H  . ergocalciferol  50,000 Units Per Tube Q7 days  . feeding supplement (PRO-STAT SUGAR FREE 64)  30 mL Per Tube BID  . folic acid  1 mg Per Tube Daily  . furosemide  40 mg Intravenous Daily  . mouth rinse  15 mL Mouth Rinse q morning - 10a  . multivitamin with minerals  1 tablet Per Tube Daily  . pantoprazole  (PROTONIX) IV  40 mg Intravenous Q24H  . [START ON 06/02/2020] pneumococcal 23 valent vaccine  0.5 mL Intramuscular Tomorrow-1000  . sodium chloride flush  10-40 mL Intracatheter Q12H  . thiamine  100 mg Per Tube Daily   Continuous Infusions: . sodium chloride Stopped (05/27/20 0021)  . ceFEPime (MAXIPIME) IV 2 g (06/01/20 1318)  . feeding supplement (PIVOT 1.5 CAL) Stopped (05/30/20 0800)  . levETIRAcetam 750 mg (06/01/20 0958)  . 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 Self-extubated 6/17. The patient is currently saturating 100% on room air.   Status epilepticus: EEG demonstrated no seizure activity. However, the patient does have a known seizure disorder and had been noncompliant with his seizure medications. He also had been abusing methamphetamines. Seizure potential has been further complicated by ETOH withdrawal. Neurology ahs recommended continuation of Keppra. He has had no further seizures.  Acute metabolic encephalopathy:Methamphetamine intoxication, polysubstance use disorder, Delirium tremens due to alcohol withdrawal. There is also likely a backdrop of ICU delirium. Continue CIWA protocol with as needed haldol. QTc will be checked Q shift. He is being supplemented with thiamine and folate as well as a multivitamin. TOC has been consulted to assist the patient with polysubstance abuse rehab upon discharge.  Sinusitis, chronic R sided and acute L sided:  The patient is receiving IV cefepime and vancomycin which should be adequate to treat infection as per ENT. I appreciate their help.  Staph hominus bacteremia: The patient has received 10 days of vancomycin and 5 days of cefepime. Continue to follow surveillance cultures. No vegetations were appreciated on 05/25/2020 echocardiogram. PICC has been discontinued. The patient may require TEE.  Anemia: Monitor and transfuse for hemoglobin less than 7.0. Will check iron studies. Continue Thrombocytopenia, resolved   Hypokalemia: Resolved. Monitor.   Hypomagnesemia: Supplement and monitor  Hyponatremia: Resolved. Monitor.  Left femur fracture - s/p surgical repair 6/13 (cephalomedullary nailing): Post op care per ortho. Pt is WBAT on Lt.  Inadequate PO intake: Nutrition consult. Pt has been refusing help with meals.  I have seen and examined this patient myself. I have spent 46 minutes in his evaluation and care. More than 50% of this was spent in counseling with the patient's sister who is at bedside.  DVT Prophylaxis: Lovenox CODE STATUS: Full Code Family Communication: Discussed patient with sister who was at bedside. Disposition: The patient is from home. Anticipate discharge to SNF vs CIR. Pending evaluation.  Barrier to discharge includes continued delirium/withdrawal, pending evaluation for CIR.  Status is: Inpatient  Remains inpatient appropriate because:Altered mental status and Inpatient level of care appropriate due to severity of illness   Dispo: The patient is from: Home              Anticipated d/c is to: TBD              Anticipated d/c date is: 3 days              Patient currently is not medically stable to d/c.  Jabre Heo, DO [facility] Direct contact: see [email or web address]  7PM-7AM contact night coverage as above 06/01/2020, 2:23 PM  LOS: 9 days   ADDENDUM: Nursing advises me that the patient has been swinging at nursing, abusing staff verbally, and making  verbal threats against staff. Restraints have been ordered for this patient.

## 2020-06-01 NOTE — Progress Notes (Addendum)
INFECTIOUS DISEASE PROGRESS NOTE  ID: [name] is a 39 y.o. 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   Abtx:  Anti-infectives (From admission, onward)   Start     Dose/Rate Route Frequency Ordered Stop   05/30/20 0500  vancomycin (VANCOREADY) IVPB 1250 mg/250 mL     Discontinue     1,250 mg 166.7 mL/hr over 90 Minutes Intravenous Every 12 hours 05/29/20 2106     05/28/20 0930  ceFEPIme (MAXIPIME) 2 g in sodium chloride 0.9 % 100 mL IVPB     Discontinue     2 g 200 mL/hr over 30 Minutes Intravenous Every 8 hours 05/28/20 0842     05/26/20 0930  ceFAZolin (ANCEF) IVPB 2g/100 mL premix        2 g 200 mL/hr over 30 Minutes Intravenous Every 8 hours 05/26/20 0852 05/26/20 2346   05/25/20 1406  vancomycin (VANCOCIN) powder  Status:  Discontinued          As needed 05/25/20 1407 05/25/20 1508   05/24/20 0800  vancomycin (VANCOCIN) IVPB 1000 mg/200 mL premix  Status:  Discontinued        1,000 mg 200 mL/hr over 60 Minutes Intravenous Every 12 hours 05/23/20 2126 05/29/20 2106   05/23/20 2200  ceFEPIme (MAXIPIME) 2 g in sodium chloride 0.9 % 100 mL IVPB  Status:  Discontinued        2 g 200 mL/hr over 30 Minutes Intravenous Every 8 hours 05/23/20 2126 05/24/20 1047   05/23/20 1745  ceFEPIme (MAXIPIME) 2 g in sodium chloride 0.9 % 100 mL IVPB  Status:  Discontinued        2 g 200 mL/hr over 30 Minutes Intravenous  Once 05/23/20 1741 05/23/20 2125   05/23/20 1745  vancomycin (VANCOREADY) IVPB 1500 mg/300 mL        1,500 mg 150 mL/hr over 120 Minutes Intravenous   Once 05/23/20 1741 05/23/20 2206   05/23/20 1715  cefTRIAXone (ROCEPHIN) 2 g in sodium chloride 0.9 % 100 mL IVPB  Status:  Discontinued        2 g 200 mL/hr over 30 Minutes Intravenous  Once 05/23/20 1709 05/23/20 1741   05/23/20 1615  piperacillin-tazobactam (ZOSYN) IVPB 3.375 g  Status:  Discontinued        3.375 g 100 mL/hr over 30 Minutes Intravenous  Once 05/23/20 1601 05/23/20 1741      Medications:  Scheduled: . sodium chloride   Intravenous Once  . Chlorhexidine Gluconate Cloth  6 each Topical Daily  . cholecalciferol  2,000 Units Per Tube Daily  . enoxaparin (LOVENOX) injection  40 mg Subcutaneous Q24H  . ergocalciferol  50,000 Units Per Tube Q7 days  . feeding supplement (PRO-STAT SUGAR FREE 64)  30 mL Per Tube BID  . folic acid  1 mg Per Tube Daily  . furosemide  40 mg Intravenous Daily  . mouth rinse  15 mL Mouth Rinse q morning - 10a  . multivitamin with minerals  1 tablet  Per Tube Daily  . pantoprazole (PROTONIX) IV  40 mg Intravenous Q24H  . sodium chloride flush  10-40 mL Intracatheter Q12H  . thiamine  100 mg Per Tube Daily    Objective: Vital signs in last 24 hours: Temp:  [98.4 F (36.9 C)-100.2 F (37.9 C)] 98.9 F (37.2 C) (06/20 0801) Pulse Rate:  [78-109] 95 (06/20 0801) Resp:  [18-32] 23 (06/20 0801) BP: (81-120)/(60-103) 120/103 (06/20 0801) SpO2:  [92 %-100 %] 100 % (06/20 0801) Weight:  [84.3 kg] 84.3 kg (06/20 0339)   General appearance: alert, delirious and no distress Resp: clear to auscultation bilaterally Cardio: regular rate and rhythm GI: normal findings: bowel sounds normal and soft, non-tender  Lab Results Recent Labs    05/30/20 0522 05/30/20 1252 05/31/20 0621 05/31/20 1200 06/01/20 0539  WBC  --  15.6*  --  18.8*  --   HGB  --  8.4*  --  8.3*  --   HCT  --  27.1*  --  27.5*  --   NA   < >  --  138  --  138  K   < >  --  3.3*  --  3.6  CL   < >  --  103  --  105  CO2   < >  --  25  --  24  BUN   < >  --  18  --  18    CREATININE   < >  --  1.17  --  1.06   < > = values in this interval not displayed.   Liver Panel Recent Labs    05/30/20 0522 05/31/20 1200  PROT 6.5 7.0  ALBUMIN 2.0* 2.0*  AST 118* 130*  ALT 38 42  ALKPHOS 143* 157*  BILITOT 2.3* 1.8*  BILIDIR  --  1.0*  IBILI  --  0.8   Sedimentation Rate No results for input(s): ESRSEDRATE in the last 72 hours. C-Reactive Protein No results for input(s): CRP in the last 72 hours.  Microbiology: Recent Results (from the past 240 hour(s))  Urine culture     Status: Abnormal   Collection Time: 05/23/20  3:13 PM   Specimen: In/Out Cath Urine  Result Value Ref Range Status   Specimen Description IN/OUT CATH URINE  Final   Special Requests   Final    NONE Performed at [facility], [address] [place], [place] [address]    Culture 5,000 COLONIES/mL STAPHYLOCOCCUS HAEMOLYTICUS (A)  Final   Report Status 05/25/2020 FINAL  Final   Organism ID, Bacteria STAPHYLOCOCCUS HAEMOLYTICUS (A)  Final      Susceptibility   Staphylococcus haemolyticus - MIC*    CIPROFLOXACIN <=0.5 SENSITIVE Sensitive     GENTAMICIN <=0.5 SENSITIVE Sensitive     NITROFURANTOIN <=16 SENSITIVE Sensitive     OXACILLIN >=4 RESISTANT Resistant     TETRACYCLINE >=16 RESISTANT Resistant     VANCOMYCIN <=0.5 SENSITIVE Sensitive     TRIMETH/SULFA <=10 SENSITIVE Sensitive     CLINDAMYCIN RESISTANT Resistant     RIFAMPIN <=0.5 SENSITIVE Sensitive     Inducible Clindamycin POSITIVE Resistant     * 5,000 COLONIES/mL STAPHYLOCOCCUS HAEMOLYTICUS  Blood Culture (routine x 2)     Status: Abnormal   Collection Time: 05/23/20  4:55 PM   Specimen: BLOOD LEFT FOREARM  Result Value Ref Range Status   Specimen Description BLOOD LEFT FOREARM  Final   Special Requests   Final    BOTTLES  DRAWN AEROBIC AND ANAEROBIC Blood Culture adequate volume   Culture  Setup Time   Final    GRAM POSITIVE COCCI IN CLUSTERS IN BOTH AEROBIC AND ANAEROBIC BOTTLES CRITICAL RESULT CALLED TO,  READ BACK BY AND VERIFIED WITH: RN MIKE M [id number] FCP    Culture (A)  Final    STAPHYLOCOCCUS HAEMOLYTICUS THE SIGNIFICANCE OF ISOLATING THIS ORGANISM FROM A SINGLE SET OF BLOOD CULTURES WHEN MULTIPLE SETS ARE DRAWN IS UNCERTAIN. PLEASE NOTIFY THE MICROBIOLOGY DEPARTMENT WITHIN ONE WEEK IF SPECIATION AND SENSITIVITIES ARE REQUIRED. Performed at [facility], [place] [address] [place], [place] [address]    Report Status 05/27/2020 FINAL  Final  Blood Culture ID Panel (Reflexed)     Status: Abnormal   Collection Time: 05/23/20  4:55 PM  Result Value Ref Range Status   Enterococcus species NOT DETECTED NOT DETECTED Final   Listeria monocytogenes NOT DETECTED NOT DETECTED Final   Staphylococcus species DETECTED (A) NOT DETECTED Final    Comment: Methicillin (oxacillin) resistant coagulase negative staphylococcus. Possible blood culture contaminant (unless isolated from more than one blood culture draw or clinical case suggests pathogenicity). No antibiotic treatment is indicated for blood  culture contaminants. CRITICAL RESULT CALLED TO, READ BACK BY AND VERIFIED WITH: RN MIKE M [id number] FCP    Staphylococcus aureus (BCID) NOT DETECTED NOT DETECTED Final   Methicillin resistance DETECTED (A) NOT DETECTED Final    Comment: CRITICAL RESULT CALLED TO, READ BACK BY AND VERIFIED WITH: RN MIKE M 1012 [id number] FCP    Streptococcus species NOT DETECTED NOT DETECTED Final   Streptococcus agalactiae NOT DETECTED NOT DETECTED Final   Streptococcus pneumoniae NOT DETECTED NOT DETECTED Final   Streptococcus pyogenes NOT DETECTED NOT DETECTED Final   Acinetobacter baumannii NOT DETECTED NOT DETECTED Final   Enterobacteriaceae species NOT DETECTED NOT DETECTED Final   Enterobacter cloacae complex NOT DETECTED NOT DETECTED Final   Escherichia coli NOT DETECTED NOT DETECTED Final   Klebsiella oxytoca NOT DETECTED NOT DETECTED Final   Klebsiella pneumoniae NOT DETECTED NOT DETECTED Final    Proteus species NOT DETECTED NOT DETECTED Final   Serratia marcescens NOT DETECTED NOT DETECTED Final   Haemophilus influenzae NOT DETECTED NOT DETECTED Final   Neisseria meningitidis NOT DETECTED NOT DETECTED Final   Pseudomonas aeruginosa NOT DETECTED NOT DETECTED Final   Candida albicans NOT DETECTED NOT DETECTED Final   Candida glabrata NOT DETECTED NOT DETECTED Final   Candida krusei NOT DETECTED NOT DETECTED Final   Candida parapsilosis NOT DETECTED NOT DETECTED Final   Candida tropicalis NOT DETECTED NOT DETECTED Final    Comment: Performed at [facility], [address] [place], [place] [address]  Blood Culture (routine x 2)     Status: Abnormal   Collection Time: 05/23/20  5:10 PM   Specimen: BLOOD  Result Value Ref Range Status   Specimen Description BLOOD RIGHT ANTECUBITAL  Final   Special Requests   Final    BOTTLES DRAWN AEROBIC AND ANAEROBIC Blood Culture adequate volume   Culture  Setup Time   Final    GRAM POSITIVE COCCI IN BOTH AEROBIC AND ANAEROBIC BOTTLES    Culture (A)  Final    STAPHYLOCOCCUS HOMINIS THE SIGNIFICANCE OF ISOLATING THIS ORGANISM FROM A SINGLE SET OF BLOOD CULTURES WHEN MULTIPLE SETS ARE DRAWN IS UNCERTAIN. PLEASE NOTIFY THE MICROBIOLOGY DEPARTMENT WITHIN ONE WEEK IF SPECIATION AND SENSITIVITIES ARE REQUIRED. Performed at [facility], [place] [address] [place], [place]  [address]    Report Status 05/27/2020 FINAL  Final  SARS Coronavirus 2 by RT PCR (hospital order, performed in [facility] hospital lab) Nasopharyngeal Nasopharyngeal Swab     Status: None   Collection Time: 05/23/20  7:15 PM   Specimen: Nasopharyngeal Swab  Result Value Ref Range Status   SARS Coronavirus 2 NEGATIVE NEGATIVE Final    Comment: (NOTE) SARS-CoV-2 target nucleic acids are NOT DETECTED.  The SARS-CoV-2 RNA is generally detectable in upper and lower respiratory specimens during the acute phase of infection. The lowest concentration of SARS-CoV-2 viral  copies this assay can detect is 250 copies / mL. A negative result does not preclude SARS-CoV-2 infection and should not be used as the sole basis for treatment or other patient management decisions.  A negative result may occur with improper specimen collection / handling, submission of specimen other than nasopharyngeal swab, presence of viral mutation(s) within the areas targeted by this assay, and inadequate number of viral copies (<250 copies / mL). A negative result must be combined with clinical observations, patient history, and epidemiological information.  Fact Sheet for Patients:   StrictlyIdeas.no  Fact Sheet for Healthcare Providers: BankingDealers.co.za  This test is not yet approved or  cleared by the [place] FDA and has been authorized for detection and/or diagnosis of SARS-CoV-2 by FDA under an Emergency Use Authorization (EUA).  This EUA will remain in effect (meaning this test can be used) for the duration of the COVID-19 declaration under Section 564(b)(1) of the Act, 21 U.S.C. section 360bbb-3(b)(1), unless the authorization is terminated or revoked sooner.  Performed at [facility], [place] [address] [place], [place] [address]   MRSA PCR Screening     Status: None   Collection Time: 05/23/20  9:15 PM   Specimen: Nasal Mucosa; Nasopharyngeal  Result Value Ref Range Status   MRSA by PCR NEGATIVE NEGATIVE Final    Comment:        The GeneXpert MRSA Assay (FDA approved for NASAL specimens only), is one component of a comprehensive MRSA colonization surveillance program. It is not intended to diagnose MRSA infection nor to guide or monitor treatment for MRSA infections. Performed at [facility], [place] [address], [place], [place] [address]   Surgical PCR screen     Status: None   Collection Time: 05/25/20  9:06 AM   Specimen: Nasal Mucosa; Nasal Swab  Result Value Ref Range Status   MRSA,  PCR NEGATIVE NEGATIVE Final   Staphylococcus aureus NEGATIVE NEGATIVE Final    Comment: (NOTE) The Xpert SA Assay (FDA approved for NASAL specimens in patients 40 years of age and older), is one component of a comprehensive surveillance program. It is not intended to diagnose infection nor to guide or monitor treatment. Performed at [facility], [place] [address] [place], [place] [address]   Culture, respiratory (non-expectorated)     Status: None   Collection Time: 05/26/20 11:01 AM   Specimen: Tracheal Aspirate; Respiratory  Result Value Ref Range Status   Specimen Description TRACHEAL ASPIRATE  Final   Special Requests NONE  Final   Gram Stain   Final    RARE WBC PRESENT, PREDOMINANTLY PMN RARE GRAM POSITIVE COCCI RARE GRAM POSITIVE RODS    Culture   Final    Consistent with normal respiratory flora. Performed at [facility], [place] [address] [place], [place] [address]    Report Status 05/28/2020 FINAL  Final  Culture, respiratory (non-expectorated)  Status: None   Collection Time: 05/28/20  8:07 AM   Specimen: Tracheal Aspirate; Respiratory  Result Value Ref Range Status   Specimen Description TRACHEAL ASPIRATE  Final   Special Requests NONE  Final   Gram Stain   Final    RARE WBC PRESENT,BOTH PMN AND MONONUCLEAR NO ORGANISMS SEEN    Culture   Final    RARE Consistent with normal respiratory flora. Performed at [facility], [place] [address] [place], [place] [address]    Report Status 05/30/2020 FINAL  Final  Culture, blood (Routine X 2) w Reflex to ID Panel     Status: Abnormal   Collection Time: 05/28/20  9:06 AM   Specimen: BLOOD LEFT ARM  Result Value Ref Range Status   Specimen Description BLOOD LEFT ARM  Final   Special Requests   Final    BOTTLES DRAWN AEROBIC ONLY Blood Culture adequate volume   Culture  Setup Time   Final    AEROBIC BOTTLE ONLY GRAM POSITIVE COCCI IN CLUSTERS CRITICAL VALUE NOTED.  VALUE IS CONSISTENT WITH PREVIOUSLY  REPORTED AND CALLED VALUE. Performed at [facility], [place] [address] [place], [place] [address]    Culture STAPHYLOCOCCUS HOMINIS (A)  Final   Report Status 05/31/2020 FINAL  Final   Organism ID, Bacteria STAPHYLOCOCCUS HOMINIS  Final      Susceptibility   Staphylococcus hominis - MIC*    CIPROFLOXACIN <=0.5 SENSITIVE Sensitive     ERYTHROMYCIN >=8 RESISTANT Resistant     GENTAMICIN <=0.5 SENSITIVE Sensitive     OXACILLIN >=4 RESISTANT Resistant     TETRACYCLINE >=16 RESISTANT Resistant     VANCOMYCIN <=0.5 SENSITIVE Sensitive     TRIMETH/SULFA 80 RESISTANT Resistant     CLINDAMYCIN 0.5 SENSITIVE Sensitive     RIFAMPIN <=0.5 SENSITIVE Sensitive     Inducible Clindamycin NEGATIVE Sensitive     * STAPHYLOCOCCUS HOMINIS  Culture, blood (Routine X 2) w Reflex to ID Panel     Status: Abnormal   Collection Time: 05/28/20  9:06 AM   Specimen: BLOOD LEFT ARM  Result Value Ref Range Status   Specimen Description BLOOD LEFT ARM  Final   Special Requests   Final    BOTTLES DRAWN AEROBIC ONLY Blood Culture adequate volume   Culture  Setup Time   Final    AEROBIC BOTTLE ONLY GRAM POSITIVE COCCI CRITICAL VALUE NOTED.  VALUE IS CONSISTENT WITH PREVIOUSLY REPORTED AND CALLED VALUE.    Culture (A)  Final    STAPHYLOCOCCUS HOMINIS SUSCEPTIBILITIES PERFORMED ON PREVIOUS CULTURE WITHIN THE LAST 5 DAYS. Performed at [facility], [place] [address], [place], [place] [address]    Report Status 05/31/2020 FINAL  Final  Culture, Urine     Status: None   Collection Time: 05/28/20 10:57 AM   Specimen: Urine, Random  Result Value Ref Range Status   Specimen Description URINE, RANDOM  Final   Special Requests NONE  Final   Culture   Final    NO GROWTH Performed at [facility], [place] [address], [place], [place] [address]    Report Status 05/29/2020 FINAL  Final  Culture, blood (routine x 2)     Status: None (Preliminary result)   Collection Time: 05/30/20 12:52 PM   Specimen:  BLOOD LEFT ARM  Result Value Ref Range Status   Specimen Description BLOOD LEFT ARM  Final   Special Requests   Final    BOTTLES DRAWN AEROBIC AND  ANAEROBIC Blood Culture adequate volume   Culture   Final    NO GROWTH 2 DAYS Performed at [facility], [place] [address], [place], [place] [address]    Report Status PENDING  Incomplete  Culture, blood (routine x 2)     Status: None (Preliminary result)   Collection Time: 05/30/20 12:55 PM   Specimen: BLOOD LEFT ARM  Result Value Ref Range Status   Specimen Description BLOOD LEFT ARM  Final   Special Requests   Final    BOTTLES DRAWN AEROBIC AND ANAEROBIC Blood Culture adequate volume   Culture   Final    NO GROWTH 2 DAYS Performed at [facility], [place] [address] [place], [place] [address]    Report Status PENDING  Incomplete    Studies/Results: No results found.   Assessment/Plan: ETOH abuse Delirium, encephalopathy Seizures L hip IM nail post fall (6-11) R maxiallry sinusitis L sphenoid sinusitis 6-16 (staph hominis R-ox) 2/4 6-11 (staph hominis) 2/2 6-11 (staph haemolyticus) 2/2 6-11 UCx (staph haemolyticus)  Total days of antibiotics:  10 vanco 5 cefepime  Repeat BCx 6-18 are ngtd x 2 days He has not had fever since 4am yesterday Could consider neuro eval if his behavior/cognition do not improve.  Amylase/lipase not consistent with pancreatitis.  No change in anbx for now          Bobby Rumpf MD, FACP Infectious Diseases (pager) [phone] [email or web address] 06/01/2020, 9:16 AM  LOS: 9 days

## 2020-06-02 LAB — CBC WITH DIFFERENTIAL/PLATELET
Abs Immature Granulocytes: 0.58 10*3/uL — ABNORMAL HIGH (ref 0.00–0.07)
Basophils Absolute: 0.2 10*3/uL — ABNORMAL HIGH (ref 0.0–0.1)
Basophils Relative: 1 %
Eosinophils Absolute: 0.3 10*3/uL (ref 0.0–0.5)
Eosinophils Relative: 2 %
HCT: 25.8 % — ABNORMAL LOW (ref 39.0–52.0)
Hemoglobin: 7.7 g/dL — ABNORMAL LOW (ref 13.0–17.0)
Immature Granulocytes: 3 %
Lymphocytes Relative: 24 %
Lymphs Abs: 4.4 10*3/uL — ABNORMAL HIGH (ref 0.7–4.0)
MCH: 26.6 pg (ref 26.0–34.0)
MCHC: 29.8 g/dL — ABNORMAL LOW (ref 30.0–36.0)
MCV: 89 fL (ref 80.0–100.0)
Monocytes Absolute: 1.3 10*3/uL — ABNORMAL HIGH (ref 0.1–1.0)
Monocytes Relative: 7 %
Neutro Abs: 11.9 10*3/uL — ABNORMAL HIGH (ref 1.7–7.7)
Neutrophils Relative %: 63 %
Platelets: 236 10*3/uL (ref 150–400)
RBC: 2.9 MIL/uL — ABNORMAL LOW (ref 4.22–5.81)
RDW: 20.8 % — ABNORMAL HIGH (ref 11.5–15.5)
WBC: 18.5 10*3/uL — ABNORMAL HIGH (ref 4.0–10.5)
nRBC: 0.6 % — ABNORMAL HIGH (ref 0.0–0.2)

## 2020-06-02 LAB — BASIC METABOLIC PANEL
Anion gap: 9 (ref 5–15)
BUN: 15 mg/dL (ref 6–20)
CO2: 22 mmol/L (ref 22–32)
Calcium: 8.2 mg/dL — ABNORMAL LOW (ref 8.9–10.3)
Chloride: 109 mmol/L (ref 98–111)
Creatinine, Ser: 1.05 mg/dL (ref 0.61–1.24)
GFR calc Af Amer: >60 mL/min (ref >60)
GFR calc non Af Amer: >60 mL/min (ref >60)
Glucose, Bld: 112 mg/dL — ABNORMAL HIGH (ref 70–99)
Potassium: 3.1 mmol/L — ABNORMAL LOW (ref 3.5–5.1)
Sodium: 140 mmol/L (ref 135–145)

## 2020-06-02 LAB — GLUCOSE, CAPILLARY
Glucose-Capillary: 116 mg/dL — ABNORMAL HIGH (ref 70–99)
Glucose-Capillary: 92 mg/dL (ref 70–99)
Glucose-Capillary: 104 mg/dL — ABNORMAL HIGH (ref 70–99)
Glucose-Capillary: 90 mg/dL (ref 70–99)
Glucose-Capillary: 101 mg/dL — ABNORMAL HIGH (ref 70–99)
Glucose-Capillary: 84 mg/dL (ref 70–99)

## 2020-06-02 LAB — IRON AND TIBC
Iron: 31 ug/dL — ABNORMAL LOW (ref 45–182)
Saturation Ratios: 10 % — ABNORMAL LOW (ref 17.9–39.5)
TIBC: 315 ug/dL (ref 250–450)
UIBC: 284 ug/dL

## 2020-06-02 LAB — FERRITIN: Ferritin: 130 ng/mL (ref 24–336)

## 2020-06-02 LAB — FOLATE: Folate: 17.5 ng/mL (ref >5.9)

## 2020-06-02 MED ORDER — ENSURE ENLIVE PO LIQD
237.0000 mL | Freq: Two times a day (BID) | ORAL | Status: DC
  Administered 2020-06-02 – 2020-06-06 (×5): 237 mL via ORAL

## 2020-06-02 NOTE — Progress Notes (Signed)
Nutrition Follow-up  DOCUMENTATION CODES:   Not applicable  INTERVENTION:  Ensure Enlive po BID, each supplement provides 350 kcal and 20 grams of protein  D/c 78ml Pro-stat BID  D/c Pivot 1.5 cal @ 41ml/hr  NUTRITION DIAGNOSIS:   Increased nutrient needs related to post-op healing as evidenced by estimated needs.  Ongoing.  GOAL:   Patient will meet greater than or equal to 90% of their needs  Progressing.   MONITOR:   TF tolerance, Vent status, Labs  REASON FOR ASSESSMENT:   Consult, Ventilator Enteral/tube feeding initiation and management  ASSESSMENT:   Pt with PMH of ETOH and sz disorder who is noncompliant with medications admitted after drinking binge then alcohol withdrawal seizures and fall at home.  6/11 intubated 6/12 OG placed 6/17 extubated, OG removed  6/19 regular diet  Pt unable to coherently answer questions at this time.  Discussed pt with RN.  Of note, pt with orders for tube feeding and pro-stat per tube but has no access since 6/17 and has had a diet order since 6/19. RD will discontinue tube feeding and order oral nutrition supplements.   No PO intake documented.   Labs: K+ 3.1 (L) CBGs [id number] Medications: Vitamin D3, Drisdol, folvite, Lasix, MVI, Thiamine  UOP: 2,093ml x24 hours I/O: +102.42ml since admit   Diet Order:   Diet Order            Diet regular Room service appropriate? No; Fluid consistency: Thin  Diet effective now                 EDUCATION NEEDS:   No education needs have been identified at this time  Skin:  Skin Assessment: Skin Integrity Issues: Skin Integrity Issues:: Incisions Incisions: L Leg  Last BM:  6/20 type 6  Height:   Ht Readings from Last 1 Encounters:  05/24/20 5\' 8"  (1.727 m)    Weight:   Wt Readings from Last 1 Encounters:  06/02/20 83.5 kg    Ideal Body Weight:  65.4 kg  BMI:  Body mass index is 27.99 kg/m.  Estimated Nutritional Needs:   Kcal:   2400-2600  Protein:  115-130 grams  Fluid:  2.4 L    Larkin Ina, MS, RD, LDN RD pager number and weekend/on-call pager number located in [place].

## 2020-06-02 NOTE — Progress Notes (Signed)
Physical Therapy Treatment Patient Details Name: [name] MRN: [id number] DOB: 1981-12-13 Today's Date: 06/02/2020    History of Present Illness Pt 39 y.o. male admitted on 05/23/20 for recent fall resulting in L femur fx and seizures.  CT head 05/23/20 atrophy. Intubated 05/23/20 in ED-extubated on 05/29/20. s/p L hip IM nail surgery 05/25/20. EEG 05/25/20 mod to diffuse severe encephalopathy. CT Maxillofacial 05/29/20  severe mucosal thickening of R maxillary sinus, probable chronic sinusitis, L sided acute sinusitis. PMH L BKA, ETOH use, seizures.    PT Comments    Pt confused, hallucinating, and getting agitated.  Unsafe to participate with OOB activity.  He did participate with some supine exercises with max encouragement and assist to initiate and control correct form.  Cont POC as able.     Follow Up Recommendations  CIR     Equipment Recommendations  Other (comment) (TBD)    Recommendations for Other Services       Precautions / Restrictions Precautions Precautions: Fall;Other (comment) Restrictions Weight Bearing Restrictions: Yes LLE Weight Bearing: Weight bearing as tolerated    Mobility  Bed Mobility               General bed mobility comments: defered due to unsafe (hallucinating, not follow commands, agitated)  Transfers                    Ambulation/Gait                 Stairs             Wheelchair Mobility    Modified Rankin (Stroke Patients Only)       Balance                                            Cognition Arousal/Alertness: Awake/alert Behavior During Therapy: Restless;Impulsive;Agitated Overall Cognitive Status: Impaired/Different from baseline Area of Impairment: Orientation;Attention;Memory;Following commands;Safety/judgement;Awareness;Problem solving                 Orientation Level: Person;Place;Time;Situation Current Attention Level: Focused Memory: Decreased short-term  memory;Decreased recall of precautions Following Commands: Follows one step commands inconsistently;Follows one step commands with increased time Safety/Judgement: Decreased awareness of safety;Decreased awareness of deficits Awareness: Intellectual Problem Solving: Slow processing;Decreased initiation;Requires verbal cues;Difficulty sequencing;Requires tactile cues General Comments: Pt confused and hallucinating at this time. Pt in soft waist restraint and RN in to give Ativan. Unable to follow any commands.      Exercises General Exercises - Lower Extremity Short Arc QuadSinclair [name];Both;5 reps;Supine Heel Slides: AAROM;Both;10 reps;Supine Hip ABduction/ADduction: AAROM;Both;10 reps;Supine Straight Leg Raises: AAROM;Both;10 reps;Supine    General Comments General comments (skin integrity, edema, etc.): Pt unable to participate with transfers safely due to hallucinations, not following commands, and agitated.  Did perform bed exercises with max cues.      Pertinent Vitals/Pain Pain Assessment: No/denies pain Faces Pain Scale: No hurt Pain Intervention(s): Monitored during session    Home Living   Prior Function    PT Goals (current goals can now be found in the care plan section) Acute Rehab PT Goals PT Goal Formulation: Patient unable to participate in goal setting Time For Goal Achievement: 06/15/20 Progress towards PT goals: Not progressing toward goals - comment (still limited due to cognition)    Frequency    Min 5X/week      PT Plan Current plan remains appropriate  Co-evaluation              AM-PAC PT "6 Clicks" Mobility   Outcome Measure  Help needed turning from your back to your side while in a flat bed without using bedrails?: A Lot Help needed moving from lying on your back to sitting on the side of a flat bed without using bedrails?: Total Help needed moving to and from a bed to a chair (including a wheelchair)?: Total Help needed standing up from  a chair using your arms (e.g., wheelchair or bedside chair)?: Total Help needed to walk in hospital room?: Total Help needed climbing 3-5 steps with a railing? : Total 6 Click Score: 7    End of Session Equipment Utilized During Treatment: Gait belt Activity Tolerance: Other (comment) (limited by cognition) Patient left: in bed;with call bell/phone within reach;with bed alarm set;with restraints reapplied Nurse Communication: Mobility status PT Visit Diagnosis: Unsteadiness on feet (R26.81);Other abnormalities of gait and mobility (R26.89);Muscle weakness (generalized) (M62.81);Difficulty in walking, not elsewhere classified (R26.2)     Time: 1310-1320 PT Time Calculation (min) (ACUTE ONLY): 10 min  Charges:  $Therapeutic Exercise: 8-22 mins                     [name], PT Acute Rehab Services Pager [phone] [name] [place] 06/02/2020, 1:25 PM

## 2020-06-02 NOTE — Progress Notes (Signed)
Patient ID: [name], male   DOB: 1981/03/25, 39 y.o.   MRN: [id number]         [facility] for Infectious Disease  Date of Admission:  05/23/2020           Day 11 vancomycin        Day 6 cefepime ASSESSMENT: He is now afebrile.  Unfortunately antibiotic susceptibilities were not done on both staph hominis isolates as requested last week so I cannot determine if he had true bacteremia or insignificant contaminants.  I have requested them again.  I will continue vancomycin for now cefepime.  PLAN: 1. Continue vancomycin 2. Discontinue cefepime  Principal Problem:   Fever Active Problems:   Positive blood culture   Sinusitis   Delirium   Alcohol abuse   Hx of BKA, left (HCC)   Steatosis of liver   Thoracic ascending aortic aneurysm (HCC)   Tobacco abuse   Seizure (HCC)   Elective surgery   S/p left hip fracture   Respiratory failure (HCC)   Acute hypoxemic respiratory failure (HCC)   Amphetamine user (HCC)   Normocytic anemia   Encephalopathy acute   Scheduled Meds: . sodium chloride   Intravenous Once  . Chlorhexidine Gluconate Cloth  6 each Topical Daily  . cholecalciferol  2,000 Units Per Tube Daily  . enoxaparin (LOVENOX) injection  40 mg Subcutaneous Q24H  . ergocalciferol  50,000 Units Per Tube Q7 days  . feeding supplement (PRO-STAT SUGAR FREE 64)  30 mL Per Tube BID  . folic acid  1 mg Per Tube Daily  . furosemide  40 mg Intravenous Daily  . mouth rinse  15 mL Mouth Rinse q morning - 10a  . multivitamin with minerals  1 tablet Per Tube Daily  . pantoprazole (PROTONIX) IV  40 mg Intravenous Q24H  . pneumococcal 23 valent vaccine  0.5 mL Intramuscular Tomorrow-1000  . sodium chloride flush  10-40 mL Intracatheter Q12H  . thiamine  100 mg Per Tube Daily   Continuous Infusions: . sodium chloride Stopped (05/27/20 0021)  . ceFEPime (MAXIPIME) IV 2 g (06/02/20 3154)  . feeding supplement (PIVOT 1.5 CAL) Stopped (05/30/20 0800)  . levETIRAcetam 750  mg (06/02/20 0735)  . vancomycin 1,250 mg (06/02/20 0453)   PRN Meds:.sodium chloride, acetaminophen (TYLENOL) oral liquid 160 mg/5 mL, docusate, haloperidol lactate, LORazepam **OR** LORazepam, polyethylene glycol, sodium chloride flush  Review of Systems: Review of Systems  Unable to perform ROS: Mental acuity    No Known Allergies  OBJECTIVE: Vitals:   06/02/20 0333 06/02/20 0428 06/02/20 0500 06/02/20 0810  BP: 128/81   124/77  Pulse: 91 71    Resp: 17   20  Temp: 99.4 F (37.4 C)   98.9 F (37.2 C)  TempSrc: Oral   Oral  SpO2: 100%     Weight:   83.5 kg   Height:       Body mass index is 27.99 kg/m.  Physical Exam Constitutional:      Comments: He is not alert and talking more clearly but remains delirious.  Cardiovascular:     Rate and Rhythm: Regular rhythm. Tachycardia present.     Heart sounds: No murmur heard.   Pulmonary:     Effort: Pulmonary effort is normal.     Breath sounds: Normal breath sounds.  Abdominal:     Palpations: Abdomen is soft.     Tenderness: There is no abdominal tenderness.  Musculoskeletal:        General:  No swelling or tenderness.     Cervical back: Neck supple.  Skin:    Findings: No rash.  Neurological:     General: No focal deficit present.     Lab Results Lab Results  Component Value Date   WBC 18.5 (H) 06/02/2020   HGB 7.7 (L) 06/02/2020   HCT 25.8 (L) 06/02/2020   MCV 89.0 06/02/2020   PLT 236 06/02/2020    Lab Results  Component Value Date   CREATININE 1.05 06/02/2020   BUN 15 06/02/2020   NA 140 06/02/2020   K 3.1 (L) 06/02/2020   CL 109 06/02/2020   CO2 22 06/02/2020    Lab Results  Component Value Date   ALT 42 05/31/2020   AST 130 (H) 05/31/2020   ALKPHOS 157 (H) 05/31/2020   BILITOT 1.8 (H) 05/31/2020     Microbiology: Recent Results (from the past 240 hour(s))  Urine culture     Status: Abnormal   Collection Time: 05/23/20  3:13 PM   Specimen: In/Out Cath Urine  Result Value Ref Range  Status   Specimen Description IN/OUT CATH URINE  Final   Special Requests   Final    NONE Performed at [facility], [address], [place], [place] [address]    Culture 5,000 COLONIES/mL STAPHYLOCOCCUS HAEMOLYTICUS (A)  Final   Report Status 05/25/2020 FINAL  Final   Organism ID, Bacteria STAPHYLOCOCCUS HAEMOLYTICUS (A)  Final      Susceptibility   Staphylococcus haemolyticus - MIC*    CIPROFLOXACIN <=0.5 SENSITIVE Sensitive     GENTAMICIN <=0.5 SENSITIVE Sensitive     NITROFURANTOIN <=16 SENSITIVE Sensitive     OXACILLIN >=4 RESISTANT Resistant     TETRACYCLINE >=16 RESISTANT Resistant     VANCOMYCIN <=0.5 SENSITIVE Sensitive     TRIMETH/SULFA <=10 SENSITIVE Sensitive     CLINDAMYCIN RESISTANT Resistant     RIFAMPIN <=0.5 SENSITIVE Sensitive     Inducible Clindamycin POSITIVE Resistant     * 5,000 COLONIES/mL STAPHYLOCOCCUS HAEMOLYTICUS  Blood Culture (routine x 2)     Status: Abnormal   Collection Time: 05/23/20  4:55 PM   Specimen: BLOOD LEFT FOREARM  Result Value Ref Range Status   Specimen Description BLOOD LEFT FOREARM  Final   Special Requests   Final    BOTTLES DRAWN AEROBIC AND ANAEROBIC Blood Culture adequate volume   Culture  Setup Time   Final    GRAM POSITIVE COCCI IN CLUSTERS IN BOTH AEROBIC AND ANAEROBIC BOTTLES CRITICAL RESULT CALLED TO, READ BACK BY AND VERIFIED WITH: RN MIKE M 42 [id number] FCP    Culture (A)  Final    STAPHYLOCOCCUS HAEMOLYTICUS THE SIGNIFICANCE OF ISOLATING THIS ORGANISM FROM A SINGLE SET OF BLOOD CULTURES WHEN MULTIPLE SETS ARE DRAWN IS UNCERTAIN. PLEASE NOTIFY THE MICROBIOLOGY DEPARTMENT WITHIN ONE WEEK IF SPECIATION AND SENSITIVITIES ARE REQUIRED. Performed at [facility], [place] [address] [place], [place] [address]    Report Status 05/27/2020 FINAL  Final  Blood Culture ID Panel (Reflexed)     Status: Abnormal   Collection Time: 05/23/20  4:55 PM  Result Value Ref Range Status   Enterococcus species NOT DETECTED NOT  DETECTED Final   Listeria monocytogenes NOT DETECTED NOT DETECTED Final   Staphylococcus species DETECTED (A) NOT DETECTED Final    Comment: Methicillin (oxacillin) resistant coagulase negative staphylococcus. Possible blood culture contaminant (unless isolated from more than one blood culture draw or clinical case suggests pathogenicity). No antibiotic treatment is indicated  for blood  culture contaminants. CRITICAL RESULT CALLED TO, READ BACK BY AND VERIFIED WITH: RN MIKE M [id number] FCP    Staphylococcus aureus (BCID) NOT DETECTED NOT DETECTED Final   Methicillin resistance DETECTED (A) NOT DETECTED Final    Comment: CRITICAL RESULT CALLED TO, READ BACK BY AND VERIFIED WITH: RN MIKE M 1012 [id number] FCP    Streptococcus species NOT DETECTED NOT DETECTED Final   Streptococcus agalactiae NOT DETECTED NOT DETECTED Final   Streptococcus pneumoniae NOT DETECTED NOT DETECTED Final   Streptococcus pyogenes NOT DETECTED NOT DETECTED Final   Acinetobacter baumannii NOT DETECTED NOT DETECTED Final   Enterobacteriaceae species NOT DETECTED NOT DETECTED Final   Enterobacter cloacae complex NOT DETECTED NOT DETECTED Final   Escherichia coli NOT DETECTED NOT DETECTED Final   Klebsiella oxytoca NOT DETECTED NOT DETECTED Final   Klebsiella pneumoniae NOT DETECTED NOT DETECTED Final   Proteus species NOT DETECTED NOT DETECTED Final   Serratia marcescens NOT DETECTED NOT DETECTED Final   Haemophilus influenzae NOT DETECTED NOT DETECTED Final   Neisseria meningitidis NOT DETECTED NOT DETECTED Final   Pseudomonas aeruginosa NOT DETECTED NOT DETECTED Final   Candida albicans NOT DETECTED NOT DETECTED Final   Candida glabrata NOT DETECTED NOT DETECTED Final   Candida krusei NOT DETECTED NOT DETECTED Final   Candida parapsilosis NOT DETECTED NOT DETECTED Final   Candida tropicalis NOT DETECTED NOT DETECTED Final    Comment: Performed at [facility], [address], [place], [place] [address]    Blood Culture (routine x 2)     Status: Abnormal   Collection Time: 05/23/20  5:10 PM   Specimen: BLOOD  Result Value Ref Range Status   Specimen Description BLOOD RIGHT ANTECUBITAL  Final   Special Requests   Final    BOTTLES DRAWN AEROBIC AND ANAEROBIC Blood Culture adequate volume   Culture  Setup Time   Final    GRAM POSITIVE COCCI IN BOTH AEROBIC AND ANAEROBIC BOTTLES    Culture (A)  Final    STAPHYLOCOCCUS HOMINIS THE SIGNIFICANCE OF ISOLATING THIS ORGANISM FROM A SINGLE SET OF BLOOD CULTURES WHEN MULTIPLE SETS ARE DRAWN IS UNCERTAIN. PLEASE NOTIFY THE MICROBIOLOGY DEPARTMENT WITHIN ONE WEEK IF SPECIATION AND SENSITIVITIES ARE REQUIRED. Performed at [facility], [place] [address], [place],  [address]    Report Status 05/27/2020 FINAL  Final  SARS Coronavirus 2 by RT PCR (hospital order, performed in [facility] hospital lab) Nasopharyngeal Nasopharyngeal Swab     Status: None   Collection Time: 05/23/20  7:15 PM   Specimen: Nasopharyngeal Swab  Result Value Ref Range Status   SARS Coronavirus 2 NEGATIVE NEGATIVE Final    Comment: (NOTE) SARS-CoV-2 target nucleic acids are NOT DETECTED.  The SARS-CoV-2 RNA is generally detectable in upper and lower respiratory specimens during the acute phase of infection. The lowest concentration of SARS-CoV-2 viral copies this assay can detect is 250 copies / mL. A negative result does not preclude SARS-CoV-2 infection and should not be used as the sole basis for treatment or other patient management decisions.  A negative result may occur with improper specimen collection / handling, submission of specimen other than nasopharyngeal swab, presence of viral mutation(s) within the areas targeted by this assay, and inadequate number of viral copies (<250 copies / mL). A negative result must be combined with clinical observations, patient history, and epidemiological information.  Fact Sheet for Patients:    StrictlyIdeas.no  Fact Sheet for Healthcare Providers:  BankingDealers.co.za  This test is not yet approved or  cleared by the [place] and has been authorized for detection and/or diagnosis of SARS-CoV-2 by FDA under an Emergency Use Authorization (EUA).  This EUA will remain in effect (meaning this test can be used) for the duration of the COVID-19 declaration under Section 564(b)(1) of the Act, 21 U.S.C. section 360bbb-3(b)(1), unless the authorization is terminated or revoked sooner.  Performed at [facility], [place] [address], [place], [place] [address]   MRSA PCR Screening     Status: None   Collection Time: 05/23/20  9:15 PM   Specimen: Nasal Mucosa; Nasopharyngeal  Result Value Ref Range Status   MRSA by PCR NEGATIVE NEGATIVE Final    Comment:        The GeneXpert MRSA Assay (FDA approved for NASAL specimens only), is one component of a comprehensive MRSA colonization surveillance program. It is not intended to diagnose MRSA infection nor to guide or monitor treatment for MRSA infections. Performed at [facility], [place] [address] [place], [place] [address]   Surgical PCR screen     Status: None   Collection Time: 05/25/20  9:06 AM   Specimen: Nasal Mucosa; Nasal Swab  Result Value Ref Range Status   MRSA, PCR NEGATIVE NEGATIVE Final   Staphylococcus aureus NEGATIVE NEGATIVE Final    Comment: (NOTE) The Xpert SA Assay (FDA approved for NASAL specimens in patients 49 years of age and older), is one component of a comprehensive surveillance program. It is not intended to diagnose infection nor to guide or monitor treatment. Performed at [facility], [place] [address], [place], [place] [address]   Culture, respiratory (non-expectorated)     Status: None   Collection Time: 05/26/20 11:01 AM   Specimen: Tracheal Aspirate; Respiratory  Result Value Ref Range Status   Specimen Description  TRACHEAL ASPIRATE  Final   Special Requests NONE  Final   Gram Stain   Final    RARE WBC PRESENT, PREDOMINANTLY PMN RARE GRAM POSITIVE COCCI RARE GRAM POSITIVE RODS    Culture   Final    Consistent with normal respiratory flora. Performed at [facility], [place] [address], [place], [place] [address]    Report Status 05/28/2020 FINAL  Final  Culture, respiratory (non-expectorated)     Status: None   Collection Time: 05/28/20  8:07 AM   Specimen: Tracheal Aspirate; Respiratory  Result Value Ref Range Status   Specimen Description TRACHEAL ASPIRATE  Final   Special Requests NONE  Final   Gram Stain   Final    RARE WBC PRESENT,BOTH PMN AND MONONUCLEAR NO ORGANISMS SEEN    Culture   Final    RARE Consistent with normal respiratory flora. Performed at [facility], [place] [address], [place], [place] [address]    Report Status 05/30/2020 FINAL  Final  Culture, blood (Routine X 2) w Reflex to ID Panel     Status: Abnormal   Collection Time: 05/28/20  9:06 AM   Specimen: BLOOD LEFT ARM  Result Value Ref Range Status   Specimen Description BLOOD LEFT ARM  Final   Special Requests   Final    BOTTLES DRAWN AEROBIC ONLY Blood Culture adequate volume   Culture  Setup Time   Final    AEROBIC BOTTLE ONLY GRAM POSITIVE COCCI IN CLUSTERS CRITICAL VALUE NOTED.  VALUE IS CONSISTENT WITH PREVIOUSLY REPORTED AND CALLED VALUE. Performed at [facility], [address] [place], [place] [address]    Culture STAPHYLOCOCCUS HOMINIS (A)  Final   Report Status 05/31/2020 FINAL  Final   Organism ID, Bacteria STAPHYLOCOCCUS HOMINIS  Final      Susceptibility   Staphylococcus hominis - MIC*    CIPROFLOXACIN <=0.5 SENSITIVE Sensitive     ERYTHROMYCIN >=8 RESISTANT Resistant     GENTAMICIN <=0.5 SENSITIVE Sensitive     OXACILLIN >=4 RESISTANT Resistant     TETRACYCLINE >=16 RESISTANT Resistant     VANCOMYCIN <=0.5 SENSITIVE Sensitive     TRIMETH/SULFA 80 RESISTANT Resistant      CLINDAMYCIN 0.5 SENSITIVE Sensitive     RIFAMPIN <=0.5 SENSITIVE Sensitive     Inducible Clindamycin NEGATIVE Sensitive     * STAPHYLOCOCCUS HOMINIS  Culture, blood (Routine X 2) w Reflex to ID Panel     Status: Abnormal (Preliminary result)   Collection Time: 05/28/20  9:06 AM   Specimen: BLOOD LEFT ARM  Result Value Ref Range Status   Specimen Description BLOOD LEFT ARM  Final   Special Requests   Final    BOTTLES DRAWN AEROBIC ONLY Blood Culture adequate volume   Culture  Setup Time   Final    AEROBIC BOTTLE ONLY GRAM POSITIVE COCCI CRITICAL VALUE NOTED.  VALUE IS CONSISTENT WITH PREVIOUSLY REPORTED AND CALLED VALUE.    Culture (A)  Final    STAPHYLOCOCCUS HOMINIS SUSCEPTIBILITIES TO FOLLOW Performed at [facility], [place] [address] [place], [place] [address]    Report Status PENDING  Incomplete  Culture, Urine     Status: None   Collection Time: 05/28/20 10:57 AM   Specimen: Urine, Random  Result Value Ref Range Status   Specimen Description URINE, RANDOM  Final   Special Requests NONE  Final   Culture   Final    NO GROWTH Performed at [facility], [place] [address], [place], [place] [address]    Report Status 05/29/2020 FINAL  Final  Culture, blood (routine x 2)     Status: None (Preliminary result)   Collection Time: 05/30/20 12:52 PM   Specimen: BLOOD LEFT ARM  Result Value Ref Range Status   Specimen Description BLOOD LEFT ARM  Final   Special Requests   Final    BOTTLES DRAWN AEROBIC AND ANAEROBIC Blood Culture adequate volume   Culture   Final    NO GROWTH 3 DAYS Performed at [facility], [place] [address], [place], [place] [address]    Report Status PENDING  Incomplete  Culture, blood (routine x 2)     Status: None (Preliminary result)   Collection Time: 05/30/20 12:55 PM   Specimen: BLOOD LEFT ARM  Result Value Ref Range Status   Specimen Description BLOOD LEFT ARM  Final   Special Requests   Final    BOTTLES DRAWN AEROBIC AND ANAEROBIC  Blood Culture adequate volume   Culture   Final    NO GROWTH 3 DAYS Performed at [facility], [place] [address] [place], [place] [address]    Report Status PENDING  Incomplete    Michel Bickers, MD [facility] [place] [facility] [phone] pager   [phone] cell 06/02/2020, 9:24 AM

## 2020-06-02 NOTE — Progress Notes (Signed)
Inpatient Rehabilitation Admissions Coordinator  Inpatient rehab consult received. I await further progress with therapy participation before determining most appropriate rehab venue. I will follow.  Danne Baxter, RN, MSN Rehab Admissions Coordinator [phone] 06/02/2020 12:47 PM

## 2020-06-02 NOTE — Progress Notes (Signed)
  Speech Language Pathology Treatment: Dysphagia;Cognitive-Linquistic  Patient Details Name: [name] MRN: [id number] DOB: 05-Jul-1981 Today's Date: 06/02/2020 Time: 4388-8757 SLP Time Calculation (min) (ACUTE ONLY): 11 min  Assessment / Plan / Recommendation Clinical Impression  Therapy for dysphagia and cognition with right arm restraint released. He continues with delirium/confusion, hallucinating once. He thought he was in [place] but wen asked why in hospital he did state "drank too much." He recalled independently that he came in on "the 11th" but wasn't sure the month. Frequent verbal redirection to task and reorientation needed. No indications of aspiration or oral impairments with multiple straw sips water and graham cracker. Will sign off for swallow and continue treatment for cognition. Pt has been extubated for 4 days. Will be able to better determine prognosis for cognition given time (how much is due to medication since 6/11 versus impairments from seizures).     HPI HPI: Pt is a 39 yo male presenting after drinking binge and then withdrawal seizures. He required intubation for airway protection 6/11; self-extuabted on 6/17. He was also found to have L hip fx s/p surgical repair 6/13. UDS positive for amphetamines. PMH includes stroke, ETOH abose, seizure disorder, BKA.       SLP Plan  Continue with current plan of care       Recommendations  Diet recommendations: Regular;Thin liquid Liquids provided via: Cup;Straw Supervision: Patient able to self feed;Staff to assist with self feeding Compensations: Slow rate;Small sips/bites Postural Changes and/or Swallow Maneuvers: Seated upright 90 degrees                Oral Care Recommendations: Oral care BID Follow up Recommendations: 24 hour supervision/assistance SLP Visit Diagnosis: Dysphagia, unspecified (R13.10);Cognitive communication deficit (V72.820) Plan: Continue with current plan of care                       Houston Siren 06/02/2020, 11:29 AM  Orbie Pyo Colvin Caroli.Ed Risk analyst [phone] Office [phone]

## 2020-06-02 NOTE — Progress Notes (Signed)
PROGRESS NOTE  [name] EQA:[id number] DOB: [birth year] DOA: 05/23/2020 PCP: [facility], [facility]  Brief History   The patient is a 39 yr old man who was admitted from the ED on 05/23/2020 after several days of seizures and a fall at home. While in the ED the patient became confused and agitated requiring 6 mg of Ativan.  This was due to ETOH withdrawal and methamphetamine intoxication. Neurology was consulted and an EEG performed. 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 There were no seizures on EEG. He was started on Keppra 750 bid.  He was also found to have suffered a left femur fracture, and rhabdomyolysis. He was admitted to [facility] and underwent surgery on 05/24/2020. The patient required intubation for airway protection on admission. He self-extubated on 05/30/2020. Sedation while on vent was difficult. Infectious disease has been consulted. The patient has received 10 days of vancomycin and 5 days of cefepime. Maxillofacial CT was performed and the patient was evaluated by ENT for possibly orbitasl fracture. The patient was also found to have left sided acute sinusitis. ENT recommended antibiotic treatment. The patient was transferred out of the ICU yesterday. He continues to suffer from delirium. SLP has been consulted to perform a cognitive eval. He has been cleared for a regular diet with thin liquids. He has been non-compliant with medications. He remains on a CIWA protocol.   2/2 Blood cultures obtained on 05/23/2020 have grown out staph heamolyticus. Repeat cultures drawn on 05/28/2020 grew out  Staph hominus. Repeat cultures drawn on 05/30/2020 have had no growth as of yet.  Infectious disease has been consulted. The patient has received 10 days of vancomycin and 5 days of cefepime.   Since the patient has come out of the ICU he has remained delirious. ID and PT have both recommended SLP  eval for cognition and language. The patient is WBAT on his left lower extremity. PT/OT has arrived to work with the patient.   Consultants  . Infectious disease . PCCM . Neurology  Procedures  . Mechanical ventilation . EEG . Intermedullary nailing of femur fracture  Antibiotics   Anti-infectives (From admission, onward)   Start     Dose/Rate Route Frequency Ordered Stop   05/30/20 0500  vancomycin (VANCOREADY) IVPB 1250 mg/250 mL     Discontinue     1,250 mg 166.7 mL/hr over 90 Minutes Intravenous Every 12 hours 05/29/20 2106     05/28/20 0930  ceFEPIme (MAXIPIME) 2 g in sodium chloride 0.9 % 100 mL IVPB  Status:  Discontinued        2 g 200 mL/hr over 30 Minutes Intravenous Every 8 hours 05/28/20 0842 06/02/20 0930   05/26/20 0930  ceFAZolin (ANCEF) IVPB 2g/100 mL premix        2 g 200 mL/hr over 30 Minutes Intravenous Every 8 hours 05/26/20 0852 05/26/20 2346   05/25/20 1406  vancomycin (VANCOCIN) powder  Status:  Discontinued          As needed 05/25/20 1407 05/25/20 1508   05/24/20 0800  vancomycin (VANCOCIN) IVPB 1000 mg/200 mL premix  Status:  Discontinued        1,000 mg 200 mL/hr over 60 Minutes Intravenous Every 12 hours 05/23/20 2126 05/29/20 2106   05/23/20 2200  ceFEPIme (MAXIPIME) 2 g in sodium chloride 0.9 % 100 mL IVPB  Status:  Discontinued        2 g  200 mL/hr over 30 Minutes Intravenous Every 8 hours 05/23/20 2126 05/24/20 1047   05/23/20 1745  ceFEPIme (MAXIPIME) 2 g in sodium chloride 0.9 % 100 mL IVPB  Status:  Discontinued        2 g 200 mL/hr over 30 Minutes Intravenous  Once 05/23/20 1741 05/23/20 2125   05/23/20 1745  vancomycin (VANCOREADY) IVPB 1500 mg/300 mL        1,500 mg 150 mL/hr over 120 Minutes Intravenous  Once 05/23/20 1741 05/23/20 2206   05/23/20 1715  cefTRIAXone (ROCEPHIN) 2 g in sodium chloride 0.9 % 100 mL IVPB  Status:  Discontinued        2 g 200 mL/hr over 30 Minutes Intravenous  Once 05/23/20 1709 05/23/20 1741   05/23/20 1615   piperacillin-tazobactam (ZOSYN) IVPB 3.375 g  Status:  Discontinued        3.375 g 100 mL/hr over 30 Minutes Intravenous  Once 05/23/20 1601 05/23/20 1741     Subjective  The patient is awake and alert resting in bed. He speaks to me when I enter the room. Some of what he says makes no sense. The rest seems to be a direct threat made to me. He is restrained. Objective   Vitals:  Vitals:   06/02/20 0428 06/02/20 0810  BP:  124/77  Pulse: 71   Resp:  20  Temp:  98.9 F (37.2 C)  SpO2:     Exam:  Constitutional:  . The patient is awake, alert, but confused. No acute distress. Respiratory:  . No increased work of breathing. . No wheezes, rales, or rhonchi . No tactile fremitus Cardiovascular:  . Regular rate and rhythm . No murmurs, ectopy, or gallups. . No lateral PMI. No thrills. Abdomen:  . Abdomen is soft, non-tender, non-distended . No hernias, masses, or organomegaly . Normoactive bowel sounds.  Musculoskeletal:  . No cyanosis, clubbing, or edema Skin:  . No rashes, lesions, ulcers . palpation of skin: no induration or nodules Neurologic:  Unable to evaluate as the patient is unable to cooperate with exam. Psychiatric:  . Unable to evaluate as the patient is unable to cooperate with exam.  I have personally reviewed the following:   Today's Data  . Vitals, BMP  Micro Data  . Blood cultures 6/11, 16, and 18.  Imaging  . Maxillofacial CT  Other Data  . EEG  Scheduled Meds: . sodium chloride   Intravenous Once  . Chlorhexidine Gluconate Cloth  6 each Topical Daily  . cholecalciferol  2,000 Units Per Tube Daily  . enoxaparin (LOVENOX) injection  40 mg Subcutaneous Q24H  . ergocalciferol  50,000 Units Per Tube Q7 days  . feeding supplement (ENSURE ENLIVE)  237 mL Oral BID BM  . folic acid  1 mg Per Tube Daily  . furosemide  40 mg Intravenous Daily  . mouth rinse  15 mL Mouth Rinse q morning - 10a  . multivitamin with minerals  1 tablet Per Tube Daily    . pantoprazole (PROTONIX) IV  40 mg Intravenous Q24H  . pneumococcal 23 valent vaccine  0.5 mL Intramuscular Tomorrow-1000  . sodium chloride flush  10-40 mL Intracatheter Q12H  . thiamine  100 mg Per Tube Daily   Continuous Infusions: . sodium chloride Stopped (05/27/20 0021)  . levETIRAcetam 750 mg (06/02/20 0735)  . 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 Self-extubated 6/17. The patient is currently saturating 100% on room air.   Status epilepticus: EEG demonstrated no seizure activity. However, the patient does have a known seizure disorder and had been noncompliant with his seizure medications. He also had been abusing methamphetamines. Seizure potential has been further complicated by ETOH withdrawal. Neurology ahs recommended continuation of Keppra. He has had no further seizures.  Acute metabolic encephalopathy:Methamphetamine intoxication, polysubstance use disorder, Delirium tremens due to alcohol withdrawal. There is also likely a backdrop of ICU delirium. Continue CIWA protocol with as needed haldol. QTc will be checked Q shift. He is being supplemented with thiamine and folate as well as a multivitamin. TOC has been consulted to assist the patient with polysubstance abuse rehab upon discharge. Pt continues to hallucinate, behave in a threatening way, and to talk nonsense. He is refusing medications.  Sinusitis, chronic R sided and acute L sided: The  patient is receiving IV vancomycin which should be adequate to treat infection as per ENT. I appreciate their help.Cefepime has been discontinued by infectious disease.  Staph hominus bacteremia: The patient has received 10 days of vancomycin and 5 days of cefepime. Continue to follow surveillance cultures. No vegetations were appreciated on 05/25/2020 echocardiogram. PICC has been discontinued. The patient may require TEE.  Anemia: Monitor and transfuse for hemoglobin less than 7.0. Will check iron studies. Thrombocytopenia, resolved Continue to monitor.  Hypokalemia: Resolved. Monitor.   Hypomagnesemia: Supplement and monitor  Hyponatremia: Resolved. Monitor.  Left femur fracture - s/p surgical repair 6/13 (cephalomedullary nailing): Post op care per ortho. Pt is WBAT on Lt.  Inadequate PO intake: Nutrition consult. Pt has been refusing help with meals.  I have seen and examined this patient myself. I have spent 36 minutes in his evaluation and care.  DVT Prophylaxis: Lovenox CODE STATUS: Full Code Family Communication: None available. Disposition: The patient is from home. Anticipate discharge to SNF vs CIR. Pending evaluation.  Barrier to discharge includes continued delirium/withdrawal, pending evaluation for CIR.  Status is: Inpatient  Remains inpatient appropriate because:Altered mental status and Inpatient level of care appropriate due to severity of illness   Dispo: The patient is from: Home              Anticipated d/c is to: TBD              Anticipated d/c date is: 3 days              Patient currently is not medically stable to d/c.  Thara Searing, DO [facility] Direct contact: see [email or web address]  7PM-7AM contact night coverage as above 06/02/2020, 2:32 PM  LOS: 9 days

## 2020-06-02 NOTE — Progress Notes (Addendum)
Occupational Therapy Evaluation Patient Details Name: [name] MRN: [id number] DOB: 1981-03-05 Today's Date: 06/02/2020    History of Present Illness Pt 39 y.o. male admitted on 05/23/20 for recent fall resulting in L femur fx and seizures.  CT head 05/23/20 atrophy. Intubated 05/23/20 in ED-extubated on 05/29/20. s/p L hip IM nail surgery 05/25/20. EEG 05/25/20 mod to diffuse severe encephalopathy. CT Maxillofacial 05/29/20  severe mucosal thickening of R maxillary sinus, probable chronic sinusitis, L sided acute sinusitis. PMH L BKA, ETOH use, seizures.   Clinical Impression   Pt presents with extensive assist required for ADLs due to deficits (see below). Guided pt in bed mobility at Max A + 2 with consistent multimodal cues needed throughout due to cognitive deficits. Pt required grossly Mod A to maintain sitting balance EOB during ADL tasks, as well as tactile and verbal cues for sequencing ADL tasks properly. Per conversation with pt's sister, pt was Modified Independent with ADLs and mobility using prosthetic LE. Per sister, pt was also cognitively intact and lives with girlfriend who is home 24/7 with pt. As pt is below baseline and unable to safely return home without continued rehab, recommend CIR. Will continue to follow acutely.     Follow Up Recommendations  CIR;Supervision/Assistance - 24 hour    Equipment Recommendations  Other (comment) (TBD)    Recommendations for Other Services Rehab consult     Precautions / Restrictions Precautions Precautions: Fall;Other (comment) Restrictions Weight Bearing Restrictions: Yes LLE Weight Bearing: Weight bearing as tolerated      Mobility Bed Mobility Overal bed mobility: Needs Assistance Bed Mobility: Supine to Sit;Sit to Supine     Supine to sit: Max assist;+2 for physical assistance Sit to supine: Max assist;+2 for physical assistance   General bed mobility comments: defered due to unsafe (hallucinating, not follow  commands, agitated)  Transfers                 General transfer comment: unable    Balance Overall balance assessment: Needs assistance Sitting-balance support: Bilateral upper extremity supported;Feet supported Sitting balance-Leahy Scale: Poor Sitting balance - Comments: Pt required consistent support to sit EOB, pt unable to hold himself up with verbal and tactile cues prompting Postural control: Posterior lean;Right lateral lean;Left lateral lean                                 ADL either performed or assessed with clinical judgement   ADL Overall ADL's : Needs assistance/impaired Eating/Feeding: Moderate assistance;Bed level   Grooming: Maximal assistance;Sitting Grooming Details (indicate cue type and reason): Pt overall Min A for washing face sitting EOB, unable to brush teeth despite multimodal cues. Pt required physical assistance to sit EOB and maintain balance throughout task Upper Body Bathing: Sitting;Moderate assistance   Lower Body Bathing: Maximal assistance;Bed level   Upper Body Dressing : Maximal assistance;Sitting   Lower Body Dressing: Total assistance;Bed level   Toilet Transfer: +2 for physical assistance Toilet Transfer Details (indicate cue type and reason): unsafe to attempt at this time due to assistance needed to maintain sitting balance  Toileting- Clothing Manipulation and Hygiene: Total assistance;Bed level       Functional mobility during ADLs: Total assistance General ADL Comments: Pt requires extensive assist for ADLs at this time due to cognitive impairments impacting overall safety, as well as decreased strength, balance, and endurance.      Vision Patient Visual Report: No change from baseline  Perception     Praxis      Pertinent Vitals/Pain Pain Assessment: No/denies pain Faces Pain Scale: No hurt Pain Intervention(s): Monitored during session     Hand Dominance Right   Extremity/Trunk Assessment  Upper Extremity Assessment Upper Extremity Assessment: Generalized weakness   Lower Extremity Assessment Lower Extremity Assessment: Defer to PT evaluation       Communication Communication Communication: Other (comment) (communication impacted by severity of confusion)   Cognition Arousal/Alertness: Awake/alert Behavior During Therapy: Restless;Impulsive;Agitated Overall Cognitive Status: Impaired/Different from baseline Area of Impairment: Orientation;Attention;Memory;Following commands;Safety/judgement;Awareness;Problem solving                 Orientation Level: Person;Place;Time;Situation Current Attention Level: Focused Memory: Decreased short-term memory;Decreased recall of precautions Following Commands: Follows one step commands inconsistently;Follows one step commands with increased time Safety/Judgement: Decreased awareness of safety;Decreased awareness of deficits Awareness: Intellectual Problem Solving: Slow processing;Decreased initiation;Requires verbal cues;Difficulty sequencing;Requires tactile cues   General Comments  Pt unable to participate with transfers safely due to hallucinations, not following commands, and agitated.    Exercises    Shoulder Instructions      Home Living Family/patient expects to be discharged to:: Private residence Living Arrangements: Spouse/significant other Available Help at Discharge: Family;Available 24 hours/day Type of Home: House Home Access: Level entry                         Additional Comments: Per sister, pt's girlfriend is at home with pt 24/7, as she is on disability as wellq      Prior Functioning/Environment Level of Independence: Independent with assistive device(s)        Comments: Per sister, pt was MOD I for ADLs/mobility with prosthetic LE, no use of RW        OT Problem List: Decreased strength;Decreased activity tolerance;Impaired balance (sitting and/or standing);Decreased  coordination;Decreased cognition;Decreased safety awareness;Decreased knowledge of use of DME or AE;Decreased knowledge of precautions      OT Treatment/Interventions: Self-care/ADL training;Therapeutic exercise;Energy conservation;DME and/or AE instruction;Therapeutic activities;Patient/family education    OT Goals(Current goals can be found in the care plan section) Acute Rehab OT Goals Patient Stated Goal: get out of this room  OT Goal Formulation: Patient unable to participate in goal setting Time For Goal Achievement: 06/16/20 Potential to Achieve Goals: Good ADL Goals Pt Will Perform Eating: with set-up;sitting Pt Will Perform Grooming: with min guard assist;sitting Pt Will Perform Upper Body Bathing: with mod assist;sitting Pt Will Perform Lower Body Bathing: with mod assist;bed level Additional ADL Goal #1: Pt to demonstrate ability to maintain sitting balance with no more than Min A during ADL tasks for 5 minutes. Additional ADL Goal #2: Pt to demonstrate ability to follow one step commands 75% of the time in order to maximize independence with daily tasks.  OT Frequency: Min 2X/week   Barriers to D/C:            Co-evaluation              AM-PAC OT "6 Clicks" Daily Activity     Outcome Measure Help from another person eating meals?: A Lot Help from another person taking care of personal grooming?: A Lot Help from another person toileting, which includes using toliet, bedpan, or urinal?: Total Help from another person bathing (including washing, rinsing, drying)?: A Lot Help from another person to put on and taking off regular upper body clothing?: A Lot Help from another person to put on and taking off regular  lower body clothing?: Total 6 Click Score: 10   End of Session Nurse Communication: Mobility status  Activity Tolerance: Other (comment) (limited by cognition) Patient left: in bed;with call bell/phone within reach;with bed alarm set;with restraints  reapplied  OT Visit Diagnosis: Unsteadiness on feet (R26.81);Muscle weakness (generalized) (M62.81);History of falling (Z91.81);Other symptoms and signs involving cognitive function                Time: 1035-1101 OT Time Calculation (min): 26 min Charges:  OT General Charges $OT Visit: 1 Visit OT Evaluation $OT Eval Moderate Complexity: 1 Mod OT Treatments $Self Care/Home Management : 8-22 mins  Layla Maw, OTR/L  Acute OT Clinical Specialist Acute Rehabilitation Services Pager [phone] Office [phone]   Layla Maw 06/02/2020, 2:30 PM

## 2020-06-03 LAB — CULTURE, BLOOD (ROUTINE X 2): Special Requests: ADEQUATE

## 2020-06-03 LAB — GLUCOSE, CAPILLARY
Glucose-Capillary: 100 mg/dL — ABNORMAL HIGH (ref 70–99)
Glucose-Capillary: 88 mg/dL (ref 70–99)
Glucose-Capillary: 107 mg/dL — ABNORMAL HIGH (ref 70–99)
Glucose-Capillary: 89 mg/dL (ref 70–99)
Glucose-Capillary: 106 mg/dL — ABNORMAL HIGH (ref 70–99)
Glucose-Capillary: 113 mg/dL — ABNORMAL HIGH (ref 70–99)

## 2020-06-03 MED ORDER — ERGOCALCIFEROL 200 MCG/ML PO SOLN: 50000.0000 [IU] | ORAL | Status: DC

## 2020-06-03 MED ORDER — VITAMIN D (ERGOCALCIFEROL) 1.25 MG (50000 UNIT) PO CAPS
50000.0000 [IU] | ORAL_CAPSULE | ORAL | Status: DC
  Administered 2020-06-03: 50000 [IU] via ORAL
  Filled 2020-06-03: qty 1

## 2020-06-03 MED ORDER — SODIUM CHLORIDE 0.9 % IV SOLN
510.0000 mg | Freq: Once | INTRAVENOUS | Status: AC
  Administered 2020-06-03: 510 mg via INTRAVENOUS
  Filled 2020-06-03: qty 17

## 2020-06-03 MED ORDER — CHLORHEXIDINE GLUCONATE CLOTH 2 % EX PADS
6.0000 | MEDICATED_PAD | Freq: Every day | CUTANEOUS | Status: DC
  Administered 2020-06-03: 6 via TOPICAL

## 2020-06-03 NOTE — Plan of Care (Signed)
  Problem: Education: Goal: Knowledge of General Education information will improve Description: Including pain rating scale, medication(s)/side effects and non-pharmacologic comfort measures Outcome: Progressing   Problem: Health Behavior/Discharge Planning: Goal: Ability to manage health-related needs will improve Outcome: Progressing   Problem: Nutrition: Goal: Adequate nutrition will be maintained Outcome: Progressing   Problem: Coping: Goal: Level of anxiety will decrease Outcome: Progressing  Patient able to feed self and participate in physical therapy after ativan administration.

## 2020-06-03 NOTE — Progress Notes (Signed)
Inpatient Rehabilitation Admissions Coordinator  I spoke with pt's sister, [name] by phone. We discussed goals and expectations of a possible CIR admit prior to return home with his girlfriend, [place]. She prefers CIR rather than SNF. I await his ability to participate more with therapy before possible CIR admit. We will follow.  Danne Baxter, RN, MSN Rehab Admissions Coordinator [phone] 06/03/2020 5:40 PM

## 2020-06-03 NOTE — Progress Notes (Signed)
Patient ID: [name], male   DOB: 08-11-1981, 39 y.o.   MRN: [id number]         [facility]  Date of Admission:  05/23/2020           Day 12 vancomycin         ASSESSMENT: Identical staph hominis isolates grew in recent blood cultures but I now note that they were from the same blood stick.  I suspect that these represent contaminants.  Repeat blood cultures are negative.  I will stop vancomycin now  PLAN: 1. Discontinue vancomycin 2. I will sign off now  Principal Problem:   Fever Active Problems:   Positive blood culture   Sinusitis   Delirium   Alcohol abuse   Hx of BKA, left (HCC)   Steatosis of liver   Thoracic ascending aortic aneurysm (HCC)   Tobacco abuse   Seizure ([place])   Elective surgery   S/p left hip fracture   Respiratory failure (HCC)   Acute hypoxemic respiratory failure (HCC)   Amphetamine user (HCC)   Normocytic anemia   Encephalopathy acute   Scheduled Meds: . sodium chloride   Intravenous Once  . Chlorhexidine Gluconate Cloth  6 each Topical Daily  . cholecalciferol  2,000 Units Per Tube Daily  . enoxaparin (LOVENOX) injection  40 mg Subcutaneous Q24H  . ergocalciferol  50,000 Units Per Tube Q7 days  . feeding supplement (ENSURE ENLIVE)  237 mL Oral BID BM  . folic acid  1 mg Per Tube Daily  . furosemide  40 mg Intravenous Daily  . mouth rinse  15 mL Mouth Rinse q morning - 10a  . multivitamin with minerals  1 tablet Per Tube Daily  . pantoprazole (PROTONIX) IV  40 mg Intravenous Q24H  . pneumococcal 23 valent vaccine  0.5 mL Intramuscular Tomorrow-1000  . sodium chloride flush  10-40 mL Intracatheter Q12H  . thiamine  100 mg Per Tube Daily   Continuous Infusions: . sodium chloride 10 mL/hr at 06/02/20 2026  . ferumoxytol    . levETIRAcetam 750 mg (06/03/20 1033)  . vancomycin 1,250 mg (06/03/20 1040)   PRN Meds:.sodium chloride, acetaminophen (TYLENOL) oral liquid 160 mg/5 mL, docusate, haloperidol  lactate, LORazepam **OR** LORazepam, polyethylene glycol, sodium chloride flush  Review of Systems: Review of Systems  Unable to perform ROS: Mental acuity    No Known Allergies  OBJECTIVE: Vitals:   06/02/20 2306 06/03/20 0328 06/03/20 0700 06/03/20 1100  BP: 133/89 134/78 103/70 109/73  Pulse:   (!) 115 95  Resp:   (!) 23 20  Temp: 98.6 F (37 C) 98.5 F (36.9 C) 98.7 F (37.1 C) 98.6 F (37 C)  TempSrc: Oral Oral Oral Oral  SpO2:   100% 100%  Weight:      Height:       Body mass index is 27.99 kg/m.  Physical Exam Constitutional:      Comments: He is more alert but remains confused.  Cardiovascular:     Rate and Rhythm: Regular rhythm. Tachycardia present.     Heart sounds: No murmur heard.   Pulmonary:     Effort: Pulmonary effort is normal.     Breath sounds: Normal breath sounds.  Abdominal:     Palpations: Abdomen is soft.     Tenderness: There is no abdominal tenderness.  Musculoskeletal:        General: No swelling or tenderness.     Cervical back: Neck supple.  Skin:  Findings: No rash.  Neurological:     General: No focal deficit present.     Lab Results Lab Results  Component Value Date   WBC 18.5 (H) 06/02/2020   HGB 7.7 (L) 06/02/2020   HCT 25.8 (L) 06/02/2020   MCV 89.0 06/02/2020   PLT 236 06/02/2020    Lab Results  Component Value Date   CREATININE 1.05 06/02/2020   BUN 15 06/02/2020   NA 140 06/02/2020   K 3.1 (L) 06/02/2020   CL 109 06/02/2020   CO2 22 06/02/2020    Lab Results  Component Value Date   ALT 42 05/31/2020   AST 130 (H) 05/31/2020   ALKPHOS 157 (H) 05/31/2020   BILITOT 1.8 (H) 05/31/2020     Microbiology: Recent Results (from the past 240 hour(s))  Surgical PCR screen     Status: None   Collection Time: 05/25/20  9:06 AM   Specimen: Nasal Mucosa; Nasal Swab  Result Value Ref Range Status   MRSA, PCR NEGATIVE NEGATIVE Final   Staphylococcus aureus NEGATIVE NEGATIVE Final    Comment: (NOTE) The Xpert  SA Assay (FDA approved for NASAL specimens in patients 54 years of age and older), is one component of a comprehensive surveillance program. It is not intended to diagnose infection nor to guide or monitor treatment. Performed at [facility], [place] [address], [place], [place] [address]   Culture, respiratory (non-expectorated)     Status: None   Collection Time: 05/26/20 11:01 AM   Specimen: Tracheal Aspirate; Respiratory  Result Value Ref Range Status   Specimen Description TRACHEAL ASPIRATE  Final   Special Requests NONE  Final   Gram Stain   Final    RARE WBC PRESENT, PREDOMINANTLY PMN RARE GRAM POSITIVE COCCI RARE GRAM POSITIVE RODS    Culture   Final    Consistent with normal respiratory flora. Performed at [facility], [place] [address], [place], [place] [address]    Report Status 05/28/2020 FINAL  Final  Culture, respiratory (non-expectorated)     Status: None   Collection Time: 05/28/20  8:07 AM   Specimen: Tracheal Aspirate; Respiratory  Result Value Ref Range Status   Specimen Description TRACHEAL ASPIRATE  Final   Special Requests NONE  Final   Gram Stain   Final    RARE WBC PRESENT,BOTH PMN AND MONONUCLEAR NO ORGANISMS SEEN    Culture   Final    RARE Consistent with normal respiratory flora. Performed at [facility], [place] [address] [place], [place] [address]    Report Status 05/30/2020 FINAL  Final  Culture, blood (Routine X 2) w Reflex to ID Panel     Status: Abnormal   Collection Time: 05/28/20  9:06 AM   Specimen: BLOOD LEFT ARM  Result Value Ref Range Status   Specimen Description BLOOD LEFT ARM  Final   Special Requests   Final    BOTTLES DRAWN AEROBIC ONLY Blood Culture adequate volume   Culture  Setup Time   Final    AEROBIC BOTTLE ONLY GRAM POSITIVE COCCI IN CLUSTERS CRITICAL VALUE NOTED.  VALUE IS CONSISTENT WITH PREVIOUSLY REPORTED AND CALLED VALUE. Performed at [facility], [place] [address] [place], [place] [address]     Culture STAPHYLOCOCCUS HOMINIS (A)  Final   Report Status 05/31/2020 FINAL  Final   Organism ID, Bacteria STAPHYLOCOCCUS HOMINIS  Final      Susceptibility   Staphylococcus hominis - MIC*    CIPROFLOXACIN <=  0.5 SENSITIVE Sensitive     ERYTHROMYCIN >=8 RESISTANT Resistant     GENTAMICIN <=0.5 SENSITIVE Sensitive     OXACILLIN >=4 RESISTANT Resistant     TETRACYCLINE >=16 RESISTANT Resistant     VANCOMYCIN <=0.5 SENSITIVE Sensitive     TRIMETH/SULFA 80 RESISTANT Resistant     CLINDAMYCIN 0.5 SENSITIVE Sensitive     RIFAMPIN <=0.5 SENSITIVE Sensitive     Inducible Clindamycin NEGATIVE Sensitive     * STAPHYLOCOCCUS HOMINIS  Culture, blood (Routine X 2) w Reflex to ID Panel     Status: Abnormal   Collection Time: 05/28/20  9:06 AM   Specimen: BLOOD LEFT ARM  Result Value Ref Range Status   Specimen Description BLOOD LEFT ARM  Final   Special Requests   Final    BOTTLES DRAWN AEROBIC ONLY Blood Culture adequate volume   Culture  Setup Time   Final    AEROBIC BOTTLE ONLY GRAM POSITIVE COCCI CRITICAL VALUE NOTED.  VALUE IS CONSISTENT WITH PREVIOUSLY REPORTED AND CALLED VALUE. Performed at [facility], [place] [address], [place], [place] [address]    Culture STAPHYLOCOCCUS HOMINIS (A)  Final   Report Status 06/03/2020 FINAL  Final   Organism ID, Bacteria STAPHYLOCOCCUS HOMINIS  Final      Susceptibility   Staphylococcus hominis - MIC*    CIPROFLOXACIN <=0.5 SENSITIVE Sensitive     ERYTHROMYCIN >=8 RESISTANT Resistant     GENTAMICIN <=0.5 SENSITIVE Sensitive     OXACILLIN 1 RESISTANT Resistant     TETRACYCLINE >=16 RESISTANT Resistant     VANCOMYCIN <=0.5 SENSITIVE Sensitive     TRIMETH/SULFA 80 RESISTANT Resistant     CLINDAMYCIN 0.5 SENSITIVE Sensitive     RIFAMPIN <=0.5 SENSITIVE Sensitive     Inducible Clindamycin NEGATIVE Sensitive     * STAPHYLOCOCCUS HOMINIS  Culture, Urine     Status: None   Collection Time: 05/28/20 10:57 AM   Specimen: Urine, Random  Result Value  Ref Range Status   Specimen Description URINE, RANDOM  Final   Special Requests NONE  Final   Culture   Final    NO GROWTH Performed at [facility], [place] [address], [place], [place] [address]    Report Status 05/29/2020 FINAL  Final  Culture, blood (routine x 2)     Status: None (Preliminary result)   Collection Time: 05/30/20 12:52 PM   Specimen: BLOOD LEFT ARM  Result Value Ref Range Status   Specimen Description BLOOD LEFT ARM  Final   Special Requests   Final    BOTTLES DRAWN AEROBIC AND ANAEROBIC Blood Culture adequate volume   Culture   Final    NO GROWTH 4 DAYS Performed at [facility], [place] [address] [place], [place] [address]    Report Status PENDING  Incomplete  Culture, blood (routine x 2)     Status: None (Preliminary result)   Collection Time: 05/30/20 12:55 PM   Specimen: BLOOD LEFT ARM  Result Value Ref Range Status   Specimen Description BLOOD LEFT ARM  Final   Special Requests   Final    BOTTLES DRAWN AEROBIC AND ANAEROBIC Blood Culture adequate volume   Culture   Final    NO GROWTH 4 DAYS Performed at [facility], [place] [address], [place], [place] [address]    Report Status PENDING  Incomplete    Michel Bickers, MD [facility] [place] [facility] [phone] pager  [phone] cell 06/03/2020, 11:15 AM

## 2020-06-03 NOTE — Progress Notes (Signed)
PROGRESS NOTE  [name] ZDG:[id number] DOB: May 06, 1981 DOA: 05/23/2020 PCP: [facility]  Brief History   The patient is a 39 yr old man who was admitted from the ED on 05/23/2020 after several days of seizures and a fall at home. While in the ED the patient became confused and agitated requiring 6 mg of Ativan.  This was due to ETOH withdrawal and methamphetamine intoxication. Neurology was consulted and an EEG performed. 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 There were no seizures on EEG. He was started on Keppra 750 bid.  He was also found to have suffered a left femur fracture, and rhabdomyolysis. He was admitted to [facility] and underwent surgery on 05/24/2020. The patient required intubation for airway protection on admission. He self-extubated on 05/30/2020. Sedation while on vent was difficult. Infectious disease has been consulted. The patient has received 10 days of vancomycin and 5 days of cefepime. Maxillofacial CT was performed and the patient was evaluated by ENT for possibly orbitasl fracture. The patient was also found to have left sided acute sinusitis. ENT recommended antibiotic treatment. The patient was transferred out of the ICU yesterday. He continues to suffer from delirium. SLP has been consulted to perform a cognitive eval. He has been cleared for a regular diet with thin liquids. He has been non-compliant with medications. He remains on a CIWA protocol.   2/2 Blood cultures obtained on 05/23/2020 have grown out staph heamolyticus. Repeat cultures drawn on 05/28/2020 grew out  Staph hominus. Repeat cultures drawn on 05/30/2020 have had no growth as of yet.  Infectious disease has been consulted. The patient has received 10 days of vancomycin and 5 days of cefepime. On 06/03/2020 ID has stated that they believe that the staph hominus is a contaminant. Vancomycin has been  discontinued.  Since the patient has come out of the ICU he has remained delirious. ID and PT have both recommended SLP eval for cognition and language. The patient is WBAT on his left lower extremity. PT/OT has evaluated the patient. CIR has been consulted.  Consultants  . Infectious disease . PCCM . Neurology  Procedures  . Mechanical ventilation . EEG . Intermedullary nailing of femur fracture  Antibiotics   Anti-infectives (From admission, onward)   Start     Dose/Rate Route Frequency Ordered Stop   05/30/20 0500  vancomycin (VANCOREADY) IVPB 1250 mg/250 mL  Status:  Discontinued        1,250 mg 166.7 mL/hr over 90 Minutes Intravenous Every 12 hours 05/29/20 2106 06/03/20 1118   05/28/20 0930  ceFEPIme (MAXIPIME) 2 g in sodium chloride 0.9 % 100 mL IVPB  Status:  Discontinued        2 g 200 mL/hr over 30 Minutes Intravenous Every 8 hours 05/28/20 0842 06/02/20 0930   05/26/20 0930  ceFAZolin (ANCEF) IVPB 2g/100 mL premix        2 g 200 mL/hr over 30 Minutes Intravenous Every 8 hours 05/26/20 0852 05/26/20 2346   05/25/20 1406  vancomycin (VANCOCIN) powder  Status:  Discontinued          As needed 05/25/20 1407 05/25/20 1508   05/24/20 0800  vancomycin (VANCOCIN) IVPB 1000 mg/200 mL premix  Status:  Discontinued        1,000 mg 200 mL/hr over 60 Minutes Intravenous Every 12 hours 05/23/20 2126 05/29/20 2106   05/23/20 2200  ceFEPIme (MAXIPIME) 2 g in  sodium chloride 0.9 % 100 mL IVPB  Status:  Discontinued        2 g 200 mL/hr over 30 Minutes Intravenous Every 8 hours 05/23/20 2126 05/24/20 1047   05/23/20 1745  ceFEPIme (MAXIPIME) 2 g in sodium chloride 0.9 % 100 mL IVPB  Status:  Discontinued        2 g 200 mL/hr over 30 Minutes Intravenous  Once 05/23/20 1741 05/23/20 2125   05/23/20 1745  vancomycin (VANCOREADY) IVPB 1500 mg/300 mL        1,500 mg 150 mL/hr over 120 Minutes Intravenous  Once 05/23/20 1741 05/23/20 2206   05/23/20 1715  cefTRIAXone (ROCEPHIN) 2 g in  sodium chloride 0.9 % 100 mL IVPB  Status:  Discontinued        2 g 200 mL/hr over 30 Minutes Intravenous  Once 05/23/20 1709 05/23/20 1741   05/23/20 1615  piperacillin-tazobactam (ZOSYN) IVPB 3.375 g  Status:  Discontinued        3.375 g 100 mL/hr over 30 Minutes Intravenous  Once 05/23/20 1601 05/23/20 1741     Subjective  The patient is awake and alert resting in bed. No new complaints.  Objective   Vitals:  Vitals:   06/03/20 0700 06/03/20 1100  BP: 103/70 109/73  Pulse: (!) 115 95  Resp: (!) 23 20  Temp: 98.7 F (37.1 C) 98.6 F (37 C)  SpO2: 100% 100%   Exam:  Constitutional:  . The patient is awake, alert, but confused. No acute distress. Respiratory:  . No increased work of breathing. . No wheezes, rales, or rhonchi . No tactile fremitus Cardiovascular:  . Regular rate and rhythm . No murmurs, ectopy, or gallups. . No lateral PMI. No thrills. Abdomen:  . Abdomen is soft, non-tender, non-distended . No hernias, masses, or organomegaly . Normoactive bowel sounds.  Musculoskeletal:  . No cyanosis, clubbing, or edema Skin:  . No rashes, lesions, ulcers . palpation of skin: no induration or nodules Neurologic:  Unable to evaluate as the patient is unable to cooperate with exam. Psychiatric:  . Unable to evaluate as the patient is unable to cooperate with exam.  I have personally reviewed the following:   Today's Data  . Vitals, BMP is pending  Doctor, general practice  . Blood cultures 6/11, 16, and 18.  Imaging  . Maxillofacial CT  Other Data  . EEG  Scheduled Meds: . sodium chloride   Intravenous Once  . Chlorhexidine Gluconate Cloth  6 each Topical Daily  . cholecalciferol  2,000 Units Per Tube Daily  . enoxaparin (LOVENOX) injection  40 mg Subcutaneous Q24H  . ergocalciferol  50,000 Units Per Tube Q7 days  . feeding supplement (ENSURE ENLIVE)  237 mL Oral BID BM  . folic acid  1 mg Per Tube Daily  . furosemide  40 mg Intravenous Daily  . mouth rinse   15 mL Mouth Rinse q morning - 10a  . multivitamin with minerals  1 tablet Per Tube Daily  . pantoprazole (PROTONIX) IV  40 mg Intravenous Q24H  . pneumococcal 23 valent vaccine  0.5 mL Intramuscular Tomorrow-1000  . sodium chloride flush  10-40 mL Intracatheter Q12H  . thiamine  100 mg Per Tube Daily   Continuous Infusions: . sodium chloride 10 mL/hr at 06/02/20 2026  . levETIRAcetam 750 mg (06/03/20 1033)    Principal Problem:   Fever Active Problems:   Alcohol abuse   Hx of BKA, left (HCC)   Steatosis of liver  Thoracic ascending aortic aneurysm (HCC)   Tobacco abuse   Seizure ([place])   Elective surgery   S/p left hip fracture   Respiratory failure ([place])   Acute hypoxemic respiratory failure (HCC)   Amphetamine user ([place])   Positive blood culture   Sinusitis   Delirium   Normocytic anemia   Encephalopathy acute   LOS: 11 days   A & P  Acute respiratory failure due to airway compromise requiring intubation:  Resolved. Self-extubated 6/17. The patient is currently saturating 100% on room air.   Status epilepticus: EEG demonstrated no seizure activity. However, the patient does have a known seizure disorder and had been noncompliant with his seizure medications. He also had been abusing methamphetamines. Seizure potential has been further complicated by ETOH withdrawal. Neurology ahs recommended continuation of Keppra. He has had no further seizures.  Acute metabolic encephalopathy:Methamphetamine intoxication, polysubstance use disorder, Delirium tremens due to alcohol withdrawal. There is also likely a backdrop of ICU delirium. Continue CIWA protocol with as needed haldol. QTc will be checked Q shift. He is being supplemented with thiamine and folate as well as a multivitamin. TOC has been consulted to assist the patient with polysubstance abuse rehab upon discharge. Pt continues to hallucinate, behave in a threatening way, and to talk nonsense. He is refusing medications. He  is requring soft restraints.  Sinusitis, chronic R sided and acute L sided: The patient is receiving IV vancomycin which should be adequate to treat infection as per ENT. I appreciate their help.Cefepime has been discontinued by infectious disease.  Staph hominus bacteremia: The patient has received 10 days of vancomycin and 5 days of cefepime. Continue to follow surveillance cultures. No vegetations were appreciated on 05/25/2020 echocardiogram. ID has determined that staph hominus is a contamnant. Vancomycin has been discontinued.  Anemia: Monitor and transfuse for hemoglobin less than 7.0. Iron studies demonstrate severe iron deficiencies. Nathen May has been orderd.  Thrombocytopenia, resolved Continue to monitor.  Hypokalemia: Resolved. Monitor.   Hypomagnesemia: Supplement and monitor  Hyponatremia: Resolved. Monitor.  Left femur fracture - s/p surgical repair 6/13 (cephalomedullary nailing): Post op care per ortho. Pt is WBAT on Lt.  Inadequate PO intake: Nutrition consult. Pt has been refusing help with meals.  I have seen and examined this patient myself. I have spent 32 minutes in his evaluation and care.  DVT Prophylaxis: Lovenox CODE STATUS: Full Code Family Communication: None available. Disposition: The patient is from home. Anticipate discharge to SNF vs CIR. Pending evaluation.  Barrier to discharge includes continued delirium/withdrawal, pending evaluation for CIR.  Status is: Inpatient  Remains inpatient appropriate because:Altered mental status and Inpatient level of care appropriate due to severity of illness   Dispo: The patient is from: Home              Anticipated d/c is to: TBD              Anticipated d/c date is: 3 days              Patient currently is not medically stable to d/c.  Pape Parson, DO [facility] Direct contact: see [email or web address]  7PM-7AM contact night coverage as above 06/03/2020, 2:56 PM  LOS: 9 days

## 2020-06-03 NOTE — Plan of Care (Signed)
  Problem: Education: Goal: Knowledge of General Education information will improve Description: Including pain rating scale, medication(s)/side effects and non-pharmacologic comfort measures Outcome: Progressing   Problem: Health Behavior/Discharge Planning: Goal: Ability to manage health-related needs will improve Outcome: Progressing   Problem: Clinical Measurements: Goal: Ability to maintain clinical measurements within normal limits will improve Outcome: Progressing Goal: Will remain free from infection Outcome: Progressing Goal: Diagnostic test results will improve Outcome: Progressing Goal: Respiratory complications will improve Outcome: Progressing Goal: Cardiovascular complication will be avoided Outcome: Progressing   Problem: Activity: Goal: Risk for activity intolerance will decrease Outcome: Progressing   Problem: Nutrition: Goal: Adequate nutrition will be maintained Outcome: Progressing   Problem: Coping: Goal: Level of anxiety will decrease Outcome: Progressing   Problem: Elimination: Goal: Will not experience complications related to bowel motility Outcome: Progressing Goal: Will not experience complications related to urinary retention Outcome: Progressing   Problem: Pain Managment: Goal: General experience of comfort will improve Outcome: Progressing   Problem: Safety: Goal: Ability to remain free from injury will improve Outcome: Progressing   Problem: Skin Integrity: Goal: Risk for impaired skin integrity will decrease Outcome: Progressing   Problem: Activity: Goal: Ability to tolerate increased activity will improve Outcome: Progressing   Problem: Respiratory: Goal: Ability to maintain a clear airway and adequate ventilation will improve Outcome: Progressing   Problem: Role Relationship: Goal: Method of communication will improve Outcome: Progressing   Problem: Education: Goal: Expressions of having a comfortable level of knowledge  regarding the disease process will increase Outcome: Progressing   Problem: Coping: Goal: Ability to adjust to condition or change in health will improve Outcome: Progressing Goal: Ability to identify appropriate support needs will improve Outcome: Progressing   Problem: Health Behavior/Discharge Planning: Goal: Compliance with prescribed medication regimen will improve Outcome: Progressing   Problem: Medication: Goal: Risk for medication side effects will decrease Outcome: Progressing   Problem: Clinical Measurements: Goal: Complications related to the disease process, condition or treatment will be avoided or minimized Outcome: Progressing Goal: Diagnostic test results will improve Outcome: Progressing   Problem: Safety: Goal: Verbalization of understanding the information provided will improve Outcome: Progressing   Problem: Self-Concept: Goal: Level of anxiety will decrease Outcome: Progressing Goal: Ability to verbalize feelings about condition will improve Outcome: Progressing   

## 2020-06-03 NOTE — Progress Notes (Signed)
   06/03/20 1612 06/03/20 1614  Assess: MEWS Score  Temp 99.5 F (37.5 C)  --   BP 103/71  --   Pulse Rate (!) 113  --   ECG Heart Rate (!) 117  --   Resp (!) 27 (!) 22  SpO2 99 %  --   O2 Device Room Air  --   Assess: MEWS Score  MEWS Temp 0 0  MEWS Systolic 0 0  MEWS Pulse 2 2  MEWS RR 2 1  MEWS LOC 0 0  MEWS Score 4 3  MEWS Score Color Red Yellow  Assess: if the MEWS score is Yellow or Red  Were vital signs taken at a resting state?  --  Yes  Focused Assessment  --  Documented focused assessment  Early Detection of Sepsis Score *See Row Information*  --  Low  MEWS guidelines implemented *See Row Information*  --  No, vital signs rechecked  Treat  MEWS Interventions  --  Other (Comment) (rest, patient just completed physical therapy)  Take Vital Signs  Increase Vital Sign Frequency   --  Yellow: Q 2hr X 2 then Q 4hr X 2, if remains yellow, continue Q 4hrs  Escalate  MEWS: Escalate  --  Yellow: discuss with charge nurse/RN and consider discussing with provider and RRT  Notify: Charge Nurse/RN  Name of Charge Nurse/RN Notified  --  Katie, RN  Date Charge Nurse/RN Notified  --  06/03/20  Time Charge Nurse/RN Notified  --  [id number]  Document  Patient Outcome  --  Stabilized after interventions  Patient with elevated RR after completing physical therapy. Denies pain or SOB.

## 2020-06-03 NOTE — Progress Notes (Signed)
Physical Therapy Treatment Patient Details Name: [name] MRN: [id number] DOB: [birth year] Today's Date: 06/03/2020    History of Present Illness Pt 39 y.o. male admitted on 05/23/20 for recent fall resulting in L femur fx and seizures.  CT head 05/23/20 atrophy. Intubated 05/23/20 in ED-extubated on 05/29/20. s/p L hip IM nail surgery 05/25/20. EEG 05/25/20 mod to diffuse severe encephalopathy. CT Maxillofacial 05/29/20  severe mucosal thickening of R maxillary sinus, probable chronic sinusitis, L sided acute sinusitis. PMH L BKA, ETOH use, seizures.    PT Comments    Pt demonstrating gradual progress today.  He was still significantly limited by confusion, but was able to participate with exercises and sitting EOB.  Pt unsafe to attempt standing with level of confusion and L BKA.  Required mod A for transfers and max multimodal cues for exercises.  Cont to progress as able.     Follow Up Recommendations  CIR     Equipment Recommendations  Other (comment) (TBD)    Recommendations for Other Services       Precautions / Restrictions Precautions Precautions: Fall Precaution Comments: Can be agitated at times Restrictions LLE Weight Bearing: Weight bearing as tolerated    Mobility  Bed Mobility Overal bed mobility: Needs Assistance Bed Mobility: Supine to Sit;Sit to Supine     Supine to sit: Mod assist Sit to supine: Mod assist   General bed mobility comments: Pt required assist for legs and to elevate trunk to sit at EOB; Used bed pad to facilitate scooting  Transfers                 General transfer comment: deferred due to safety: pt not following commands well enough to attempt standing on 1 leg or to apply prosthesis  Ambulation/Gait                 Stairs             Wheelchair Mobility    Modified Rankin (Stroke Patients Only)       Balance Overall balance assessment: Needs assistance Sitting-balance support: Bilateral upper extremity  supported;Feet supported Sitting balance-Leahy Scale: Fair Sitting balance - Comments: Pt sat EOB but with close supervision due to decreased safety awareness.  Only sat EOB for 3-4 minutes before insisting on laying down       Standing balance comment: deferred                            Cognition Arousal/Alertness: Awake/alert Behavior During Therapy: Restless;Impulsive Overall Cognitive Status: Impaired/Different from baseline Area of Impairment: Orientation;Attention;Memory;Following commands;Safety/judgement;Awareness;Problem solving                 Orientation Level: Person;Place;Time;Situation Current Attention Level: Focused Memory: Decreased short-term memory;Decreased recall of precautions Following Commands: Follows one step commands inconsistently;Follows one step commands with increased time Safety/Judgement: Decreased awareness of safety;Decreased awareness of deficits Awareness: Intellectual Problem Solving: Slow processing;Decreased initiation;Requires verbal cues;Difficulty sequencing;Requires tactile cues General Comments: Pt more cooperative today.  He was able to follow some commands for exercises and transfers with multimodal cues.  Pt is still very confused and carrying on conversation like he is talking to family (who are not present)      Exercises General Exercises - Lower Extremity Ankle Circles/Pumps: AROM;Right;10 reps;Supine Quad Sets: AROM;Both;10 reps;Supine Short Arc Quad: AROM;Left;10 reps;Supine Long Arc Quad: AROM;Right;10 reps;Seated Heel Slides: AROM;Both;10 reps;Supine Hip ABduction/ADduction: AROM;Both;10 reps;Supine Straight Leg Raises: Left;10 reps;Supine;AAROM Other Exercises Other  Exercises: partial bridge with R leg    General Comments General comments (skin integrity, edema, etc.): Pt's HR rest 104 bpm and up to 140 bpm sitting EOB; BP and O2 sats stable; HR back to 110 with return to supine.      Pertinent  Vitals/Pain Pain Assessment: No/denies pain    Home Living                      Prior Function            PT Goals (current goals can now be found in the care plan section) Progress towards PT goals: Progressing toward goals    Frequency    Min 5X/week      PT Plan Current plan remains appropriate    Co-evaluation              AM-PAC PT "6 Clicks" Mobility   Outcome Measure  Help needed turning from your back to your side while in a flat bed without using bedrails?: A Lot Help needed moving from lying on your back to sitting on the side of a flat bed without using bedrails?: A Lot Help needed moving to and from a bed to a chair (including a wheelchair)?: Total Help needed standing up from a chair using your arms (e.g., wheelchair or bedside chair)?: Total Help needed to walk in hospital room?: Total Help needed climbing 3-5 steps with a railing? : Total 6 Click Score: 8    End of Session   Activity Tolerance: Other (comment) (limited by cognition) Patient left: in bed;with call bell/phone within reach;with bed alarm set;with restraints reapplied Nurse Communication: Mobility status PT Visit Diagnosis: Unsteadiness on feet (R26.81);Other abnormalities of gait and mobility (R26.89);Muscle weakness (generalized) (M62.81);Difficulty in walking, not elsewhere classified (R26.2)     Time: 3428-7681 PT Time Calculation (min) (ACUTE ONLY): 25 min  Charges:  $Therapeutic Exercise: 8-22 mins $Therapeutic Activity: 8-22 mins                     [name], PT Acute Rehab Services Pager [phone] [facility] [phone]     Karlton Lemon 06/03/2020, 4:33 PM

## 2020-06-04 LAB — CBC WITH DIFFERENTIAL/PLATELET
Abs Immature Granulocytes: 0.23 10*3/uL — ABNORMAL HIGH (ref 0.00–0.07)
Basophils Absolute: 0.2 10*3/uL — ABNORMAL HIGH (ref 0.0–0.1)
Basophils Relative: 1 %
Eosinophils Absolute: 0.2 10*3/uL (ref 0.0–0.5)
Eosinophils Relative: 1 %
HCT: 26.4 % — ABNORMAL LOW (ref 39.0–52.0)
Hemoglobin: 8.2 g/dL — ABNORMAL LOW (ref 13.0–17.0)
Immature Granulocytes: 1 %
Lymphocytes Relative: 22 %
Lymphs Abs: 3.9 10*3/uL (ref 0.7–4.0)
MCH: 27.2 pg (ref 26.0–34.0)
MCHC: 31.1 g/dL (ref 30.0–36.0)
MCV: 87.7 fL (ref 80.0–100.0)
Monocytes Absolute: 1.4 10*3/uL — ABNORMAL HIGH (ref 0.1–1.0)
Monocytes Relative: 8 %
Neutro Abs: 11.9 10*3/uL — ABNORMAL HIGH (ref 1.7–7.7)
Neutrophils Relative %: 67 %
Platelets: 237 10*3/uL (ref 150–400)
RBC: 3.01 MIL/uL — ABNORMAL LOW (ref 4.22–5.81)
RDW: 20.4 % — ABNORMAL HIGH (ref 11.5–15.5)
WBC: 17.7 10*3/uL — ABNORMAL HIGH (ref 4.0–10.5)
nRBC: 0.2 % (ref 0.0–0.2)

## 2020-06-04 LAB — CULTURE, BLOOD (ROUTINE X 2)
Culture: NO GROWTH
Culture: NO GROWTH
Special Requests: ADEQUATE
Special Requests: ADEQUATE

## 2020-06-04 LAB — GLUCOSE, CAPILLARY
Glucose-Capillary: 122 mg/dL — ABNORMAL HIGH (ref 70–99)
Glucose-Capillary: 103 mg/dL — ABNORMAL HIGH (ref 70–99)

## 2020-06-04 NOTE — Progress Notes (Signed)
PROGRESS NOTE  [name] ZOX:[id number] DOB: 06-10-1981 DOA: 05/23/2020 PCP: [facility]  HPI/Recap of past 47 hours: 39 year old male admitted from the emergency room on 6/11 after presenting with fall and reportedly having several days of seizures. The emergency room, he became confused and agitated due to alcohol withdrawal and methamphetamine intoxication.  Neurology was consulted and EEG performed as patient has a history of focal seizures and look to be presenting in status epilepticus.  No seizures noted on EEG.  Started on Keppra 750.  He was also found to have a left femur fracture as well as rhabdomyolysis on admission.  He was intubated on admission for airway protection.  Patient was initially admitted to the critical care service and underwent surgery on 6/12.  Patient self extubated on 6/18.Infectious disease has been consulted. The patient has received 10 days of vancomycin and 5 days of cefepime. Maxillofacial CT was performed and the patient was evaluated by ENT for possibly orbitasl fracture. The patient was also found to have left sided acute sinusitis. ENT recommended antibiotic treatment. The patient was transferred out of the ICU yesterday. He continues to suffer from delirium. SLP has been consulted to perform a cognitive eval. He has been cleared for a regular diet with thin liquids. He has been non-compliant with medications. He remains on a CIWA protocol.   2/2 Blood cultures obtained on 05/23/2020 have grown out staph heamolyticus. Repeat cultures drawn on 05/28/2020 grew out  Staph hominus. Repeat cultures drawn on 05/30/2020 have had no growth as of yet.  Infectious disease has been consulted. The patient has received 10 days of vancomycin and 5 days of cefepime. On 06/03/2020 ID has stated that they believe that the staph hominus is a contaminant. Vancomycin has been discontinued.  Since the patient has come out of the ICU he has remained delirious.  ID and PT have both recommended SLP eval for cognition and language. The patient is WBAT on his left lower extremity. PT/OT saw patient and recommending CIR.  Inpatient rehab may possibly take this patient if his cognition improves and he is able to participate more and more physical therapy.  Assessment/Plan: Acute respiratory failure due to airway compromise requiring intubation: Resolved. Self-extubated 6/17. The patient is currently saturating 100% on room air.   Status epilepticus: EEG demonstrated no seizure activity. However, the patient does have a known seizure disorder and had been noncompliant with his seizure medications. He also had been abusing methamphetamines. Seizure potential has been further complicated by ETOH withdrawal. Neurology ahs recommended continuation of Keppra. He has had no further seizures.  Acute metabolic encephalopathy:Methamphetamine intoxication, polysubstance use disorder, Delirium tremens due to alcohol withdrawal. There is also likely a component of his prolonged hospitalization and ICU delirium.  Continue CIWA protocol with as needed haldol. QTc will be checked Q shift. He is being supplemented with thiamine and folate as well as a multivitamin. TOC has been consulted to assist the patient with polysubstance abuse rehab upon discharge. Pt continues to hallucinate.  He did not know he was at [facility]  He thinks that he is okay.  On soft restraints.  Sinusitis, chronic R sided and acute L sided: The patient was receiving IV vancomycin which should be adequate to treat infection as per ENT. I appreciate their help.Cefepime has been discontinued by infectious disease.  Antibiotics now discontinued.  Staph hominus bacteremia: The patient has received 10 days of vancomycin and 5 days of cefepime. Continue to follow surveillance  cultures. No vegetations were appreciated on 05/25/2020 echocardiogram. ID has determined that staph hominus is a contamnant. Vancomycin  has been discontinued.  ID has since signed off.  Anemia: Monitor and transfuse for hemoglobin less than 7.0. Iron studies demonstrate severe iron deficiencies. Nathen May has been orderd.  Thrombocytopenia, resolved Continue to monitor.  Overweight: Patient meets criteria BMI greater than 25  Hypokalemia: Replacing as needed.  Recheck labs in the morning.  Hypomagnesemia: Supplement and monitor  Hyponatremia: Resolved.  Recheck labs in the morning.  Left femur fracture - s/p surgical repair 6/13 (cephalomedullary nailing): Post op care per ortho. Pt is WBAT on Lt.  Inadequate PO intake: Being followed by nutrition.  On Ensure twice daily.  Starting to eat more   Code Status: Full code  Family Communication: Left message with sister  Disposition Plan: Potential discharge to inpatient rehab   Consultants:  Critical care  Infectious disease  Neurology  Physical medicine rehab  Orthopedic surgery  Procedures:  On the ventilator 6/11-6/18  EEG  Status post IM nail of left femur  Antimicrobials:  IV cefepime and vancomycin-course complete  DVT prophylaxis: SCDs   Objective: Vitals:   06/04/20 0737 06/04/20 1225  BP: 104/62 98/73  Pulse: 98 (!) 104  Resp: (!) 23 (!) 21  Temp: 98.1 F (36.7 C) 98 F (36.7 C)  SpO2: 97% 97%    Intake/Output Summary (Last 24 hours) at 06/04/2020 1553 Last data filed at 06/04/2020 1135 Gross per 24 hour  Intake --  Output 3400 ml  Net -3400 ml   Filed Weights   05/31/20 0500 06/01/20 0339 06/02/20 0500  Weight: 77.8 kg 84.3 kg 83.5 kg   Body mass index is 27.99 kg/m.  Exam:   General: Oriented x1, no acute distress  Cardiovascular: Regular rate and rhythm, S1-S2  Respiratory: Clear to auscultation bilaterally  Abdomen: Soft, nontender, nondistended, positive bowel sounds  Musculoskeletal: No clubbing or cyanosis or edema  Psychiatry: Acutely delirious, agitated   Data Reviewed: CBC: Recent Labs   Lab 05/29/20 2339 05/30/20 1252 05/31/20 1200 06/02/20 0347 06/04/20 0234  WBC 15.4* 15.6* 18.8* 18.5* 17.7*  NEUTROABS  --  12.3* 14.1* 11.9* 11.9*  HGB 8.0* 8.4* 8.3* 7.7* 8.2*  HCT 26.2* 27.1* 27.5* 25.8* 26.4*  MCV 87.9 86.9 88.1 89.0 87.7  PLT 194 221 231 236 536   Basic Metabolic Panel: Recent Labs  Lab 05/29/20 0526 05/30/20 0522 05/31/20 0621 06/01/20 0539 06/02/20 0347  NA 133* 137 138 138 140  K 3.2* 3.3* 3.3* 3.6 3.1*  CL 100 101 103 105 109  CO2 24 28 25 24 22   GLUCOSE 142* 118* 113* 101* 112*  BUN 12 12 18 18 15   CREATININE 0.84 0.94 1.17 1.06 1.05  CALCIUM 7.4* 8.4* 8.3* 8.3* 8.2*  MG 1.8  --   --  1.7  --   PHOS 2.0* 3.6 3.1 2.6  --    GFR: Estimated Creatinine Clearance: 98.4 mL/min (by C-G formula based on SCr of 1.05 mg/dL). Liver Function Tests: Recent Labs  Lab 05/29/20 0526 05/30/20 0522 05/31/20 1200  AST 100* 118* 130*  ALT 32 38 42  ALKPHOS 140* 143* 157*  BILITOT 2.4* 2.3* 1.8*  PROT 5.6* 6.5 7.0  ALBUMIN 1.7* 2.0* 2.0*   Recent Labs  Lab 05/31/20 1200  LIPASE 52*  AMYLASE 79   No results for input(s): AMMONIA in the last 168 hours. Coagulation Profile: Recent Labs  Lab 05/29/20 0526  INR 1.2   Cardiac Enzymes:  Recent Labs  Lab 05/29/20 0526  CKTOTAL 159  CKMB 0.5   BNP (last 3 results) No results for input(s): PROBNP in the last 8760 hours. HbA1C: No results for input(s): HGBA1C in the last 72 hours. CBG: Recent Labs  Lab 06/03/20 1611 06/03/20 1916 06/03/20 2327 06/04/20 0307 06/04/20 0734  GLUCAP 89 106* 113* 122* 103*   Lipid Profile: No results for input(s): CHOL, HDL, LDLCALC, TRIG, CHOLHDL, LDLDIRECT in the last 72 hours. Thyroid Function Tests: No results for input(s): TSH, T4TOTAL, FREET4, T3FREE, THYROIDAB in the last 72 hours. Anemia Panel: Recent Labs    06/02/20 1524  FOLATE 17.5  FERRITIN 130  TIBC 315  IRON 31*   Urine analysis:    Component Value Date/Time   COLORURINE STRAW (A)  05/28/2020 1057   APPEARANCEUR CLEAR 05/28/2020 1057   LABSPEC <1.005 (L) 05/28/2020 1057   PHURINE 6.0 05/28/2020 1057   GLUCOSEU NEGATIVE 05/28/2020 1057   HGBUR NEGATIVE 05/28/2020 1057   BILIRUBINUR NEGATIVE 05/28/2020 1057   KETONESUR NEGATIVE 05/28/2020 1057   PROTEINUR NEGATIVE 05/28/2020 1057   UROBILINOGEN 0.2 01/27/2013 0727   NITRITE NEGATIVE 05/28/2020 1057   LEUKOCYTESUR NEGATIVE 05/28/2020 1057   Sepsis Labs: @LABRCNTIP (procalcitonin:4,lacticidven:4)  ) Recent Results (from the past 240 hour(s))  Culture, respiratory (non-expectorated)     Status: None   Collection Time: 05/26/20 11:01 AM   Specimen: Tracheal Aspirate; Respiratory  Result Value Ref Range Status   Specimen Description TRACHEAL ASPIRATE  Final   Special Requests NONE  Final   Gram Stain   Final    RARE WBC PRESENT, PREDOMINANTLY PMN RARE GRAM POSITIVE COCCI RARE GRAM POSITIVE RODS    Culture   Final    Consistent with normal respiratory flora. Performed at [facility], [place] [address], [place], [place] [address]    Report Status 05/28/2020 FINAL  Final  Culture, respiratory (non-expectorated)     Status: None   Collection Time: 05/28/20  8:07 AM   Specimen: Tracheal Aspirate; Respiratory  Result Value Ref Range Status   Specimen Description TRACHEAL ASPIRATE  Final   Special Requests NONE  Final   Gram Stain   Final    RARE WBC PRESENT,BOTH PMN AND MONONUCLEAR NO ORGANISMS SEEN    Culture   Final    RARE Consistent with normal respiratory flora. Performed at [facility], [place] [address], [place], [place] [address]    Report Status 05/30/2020 FINAL  Final  Culture, blood (Routine X 2) w Reflex to ID Panel     Status: Abnormal   Collection Time: 05/28/20  9:06 AM   Specimen: BLOOD LEFT ARM  Result Value Ref Range Status   Specimen Description BLOOD LEFT ARM  Final   Special Requests   Final    BOTTLES DRAWN AEROBIC ONLY Blood Culture adequate volume   Culture  Setup Time    Final    AEROBIC BOTTLE ONLY GRAM POSITIVE COCCI IN CLUSTERS CRITICAL VALUE NOTED.  VALUE IS CONSISTENT WITH PREVIOUSLY REPORTED AND CALLED VALUE. Performed at [facility], [place] [address] [place], [place] [address]    Culture STAPHYLOCOCCUS HOMINIS (A)  Final   Report Status 05/31/2020 FINAL  Final   Organism ID, Bacteria STAPHYLOCOCCUS HOMINIS  Final      Susceptibility   Staphylococcus hominis - MIC*    CIPROFLOXACIN <=0.5 SENSITIVE Sensitive     ERYTHROMYCIN >=8 RESISTANT Resistant     GENTAMICIN <=0.5 SENSITIVE Sensitive     OXACILLIN >=  4 RESISTANT Resistant     TETRACYCLINE >=16 RESISTANT Resistant     VANCOMYCIN <=0.5 SENSITIVE Sensitive     TRIMETH/SULFA 80 RESISTANT Resistant     CLINDAMYCIN 0.5 SENSITIVE Sensitive     RIFAMPIN <=0.5 SENSITIVE Sensitive     Inducible Clindamycin NEGATIVE Sensitive     * STAPHYLOCOCCUS HOMINIS  Culture, blood (Routine X 2) w Reflex to ID Panel     Status: Abnormal   Collection Time: 05/28/20  9:06 AM   Specimen: BLOOD LEFT ARM  Result Value Ref Range Status   Specimen Description BLOOD LEFT ARM  Final   Special Requests   Final    BOTTLES DRAWN AEROBIC ONLY Blood Culture adequate volume   Culture  Setup Time   Final    AEROBIC BOTTLE ONLY GRAM POSITIVE COCCI CRITICAL VALUE NOTED.  VALUE IS CONSISTENT WITH PREVIOUSLY REPORTED AND CALLED VALUE. Performed at [facility], [place] [address] [place], [place] [address]    Culture STAPHYLOCOCCUS HOMINIS (A)  Final   Report Status 06/03/2020 FINAL  Final   Organism ID, Bacteria STAPHYLOCOCCUS HOMINIS  Final      Susceptibility   Staphylococcus hominis - MIC*    CIPROFLOXACIN <=0.5 SENSITIVE Sensitive     ERYTHROMYCIN >=8 RESISTANT Resistant     GENTAMICIN <=0.5 SENSITIVE Sensitive     OXACILLIN 1 RESISTANT Resistant     TETRACYCLINE >=16 RESISTANT Resistant     VANCOMYCIN <=0.5 SENSITIVE Sensitive     TRIMETH/SULFA 80 RESISTANT Resistant     CLINDAMYCIN 0.5 SENSITIVE  Sensitive     RIFAMPIN <=0.5 SENSITIVE Sensitive     Inducible Clindamycin NEGATIVE Sensitive     * STAPHYLOCOCCUS HOMINIS  Culture, Urine     Status: None   Collection Time: 05/28/20 10:57 AM   Specimen: Urine, Random  Result Value Ref Range Status   Specimen Description URINE, RANDOM  Final   Special Requests NONE  Final   Culture   Final    NO GROWTH Performed at [facility], [place] [address] [place], [place] [address]    Report Status 05/29/2020 FINAL  Final  Culture, blood (routine x 2)     Status: None   Collection Time: 05/30/20 12:52 PM   Specimen: BLOOD LEFT ARM  Result Value Ref Range Status   Specimen Description BLOOD LEFT ARM  Final   Special Requests   Final    BOTTLES DRAWN AEROBIC AND ANAEROBIC Blood Culture adequate volume   Culture   Final    NO GROWTH 5 DAYS Performed at [facility], [place] [address], [place], [place] [address]    Report Status 06/04/2020 FINAL  Final  Culture, blood (routine x 2)     Status: None   Collection Time: 05/30/20 12:55 PM   Specimen: BLOOD LEFT ARM  Result Value Ref Range Status   Specimen Description BLOOD LEFT ARM  Final   Special Requests   Final    BOTTLES DRAWN AEROBIC AND ANAEROBIC Blood Culture adequate volume   Culture   Final    NO GROWTH 5 DAYS Performed at [facility], [place] [address] [place], [place] [address]    Report Status 06/04/2020 FINAL  Final      Studies: No results found.  Scheduled Meds: . sodium chloride   Intravenous Once  . Chlorhexidine Gluconate Cloth  6 each Topical Q0600  . cholecalciferol  2,000 Units Per Tube Daily  . enoxaparin (LOVENOX) injection  40 mg  Subcutaneous Q24H  . feeding supplement (ENSURE ENLIVE)  237 mL Oral BID BM  . folic acid  1 mg Per Tube Daily  . furosemide  40 mg Intravenous Daily  . mouth rinse  15 mL Mouth Rinse q morning - 10a  . multivitamin with minerals  1 tablet Per Tube Daily  . pantoprazole (PROTONIX) IV  40 mg Intravenous Q24H  .  pneumococcal 23 valent vaccine  0.5 mL Intramuscular Tomorrow-1000  . sodium chloride flush  10-40 mL Intracatheter Q12H  . thiamine  100 mg Per Tube Daily  . Vitamin D (Ergocalciferol)  50,000 Units Oral Q7 days    Continuous Infusions: . sodium chloride 10 mL/hr at 06/02/20 2026  . levETIRAcetam 750 mg (06/04/20 0919)     LOS: 12 days     Annita Brod, MD [facility]   06/04/2020, 3:53 PM

## 2020-06-04 NOTE — Progress Notes (Signed)
Physical Therapy Treatment Patient Details Name: [name] MRN: [id number] DOB: 03-15-1981 Today's Date: 06/04/2020    History of Present Illness Pt 39 y.o. male admitted on 05/23/20 for recent fall resulting in L femur fx and seizures.  CT head 05/23/20 atrophy. Intubated 05/23/20 in ED-extubated on 05/29/20. s/p L hip IM nail surgery 05/25/20. EEG 05/25/20 mod to diffuse severe encephalopathy. CT Maxillofacial 05/29/20  severe mucosal thickening of R maxillary sinus, probable chronic sinusitis, L sided acute sinusitis. PMH L BKA, ETOH use, seizures.    PT Comments    Pt restless, stating "I need to go to the store" upon PT arrival to room. Pt required mod +2 assist for bed mobility and stand attempt, static standing only given ill-fitting prosthesis once standing. Per pt, he needs his sister to bring his residual limb socks in for improved fit. Pt remains confused and impulsive, but is more cooperative and follows commands ~50% of the time. Pt tolerated LLE exercises well with little to no post-op pain. PT continuing to recommend CIR, will continue to follow acutely.    Follow Up Recommendations  CIR     Equipment Recommendations  Other (comment) (TBD)    Recommendations for Other Services       Precautions / Restrictions Precautions Precautions: Fall Precaution Comments: restless/agitated- wrist and belt restraints Restrictions Weight Bearing Restrictions: No LLE Weight Bearing: Weight bearing as tolerated    Mobility  Bed Mobility Overal bed mobility: Needs Assistance Bed Mobility: Supine to Sit;Sit to Supine     Supine to sit: Min assist Sit to supine: Mod assist;+2 for physical assistance;+2 for safety/equipment;HOB elevated   General bed mobility comments: assist for trunk and LE management, truncal assist more when returning to supine. Increased time and effort to scoot to EOB.  Transfers Overall transfer level: Needs assistance Equipment used: 2 person hand held  assist Transfers: Sit to/from Stand Sit to Stand: Mod assist;+2 safety/equipment;From elevated surface         General transfer comment: Mod +2 for power up, bilateral LE blocking, and steadying upon standing with bilateral UE support. Pt declines assist initially, and pt VERY unsteady. Unable to get LLE prosthetic sealed in standing, returned to sitting.  Ambulation/Gait             General Gait Details: unable - prosthetic missing x2 residual limb socks per pt   Stairs             Wheelchair Mobility    Modified Rankin (Stroke Patients Only)       Balance Overall balance assessment: Needs assistance Sitting-balance support: Bilateral upper extremity supported;Feet supported Sitting balance-Leahy Scale: Fair Sitting balance - Comments: able to sit EOB without PT assist; when donning prosthesis, pt with heavy posterior leaning requiring PT assist to correct. Postural control: Posterior lean   Standing balance-Leahy Scale: Poor                              Cognition Arousal/Alertness: Awake/alert Behavior During Therapy: Impulsive;Restless Overall Cognitive Status: Impaired/Different from baseline Area of Impairment: Orientation;Attention;Memory;Following commands;Safety/judgement;Awareness;Problem solving                 Orientation Level: Place;Time;Situation;Disoriented to Current Attention Level: Focused Memory: Decreased short-term memory;Decreased recall of precautions Following Commands: Follows one step commands inconsistently;Follows one step commands with increased time Safety/Judgement: Decreased awareness of safety;Decreased awareness of deficits Awareness: Intellectual Problem Solving: Slow processing;Decreased initiation;Requires verbal cues;Difficulty sequencing;Requires tactile cues General  Comments: Pt initially appears oriented, appropriately gives name, location; but then states "go to the living room and get my pink bag"  as if he is at home. Pt also states it is May 2022. Pt is impulsive, attempts to stand without PT support and states "I do this myself"      Exercises Amputee Exercises Hip Extension: AROM;Left;10 reps;Supine Hip ABduction/ADduction: AROM;Left;10 reps;Supine Knee Extension: AROM;Left;10 reps;Supine    General Comments General comments (skin integrity, edema, etc.): HR 113-137 bpm during session, BP in sitting 80s/50s with symptomatic dizziness, returned to supine for BP return to 94/69      Pertinent Vitals/Pain Pain Assessment: No/denies pain    Home Living                      Prior Function            PT Goals (current goals can now be found in the care plan section) Progress towards PT goals: Progressing toward goals    Frequency    Min 5X/week      PT Plan Current plan remains appropriate    Co-evaluation              AM-PAC PT "6 Clicks" Mobility   Outcome Measure  Help needed turning from your back to your side while in a flat bed without using bedrails?: A Lot Help needed moving from lying on your back to sitting on the side of a flat bed without using bedrails?: A Lot Help needed moving to and from a bed to a chair (including a wheelchair)?: Total Help needed standing up from a chair using your arms (e.g., wheelchair or bedside chair)?: Total Help needed to walk in hospital room?: Total Help needed climbing 3-5 steps with a railing? : Total 6 Click Score: 8    End of Session   Activity Tolerance: Patient tolerated treatment well;Patient limited by fatigue Patient left: in bed;with call bell/phone within reach;with bed alarm set;with restraints reapplied;with SCD's reapplied Nurse Communication: Mobility status PT Visit Diagnosis: Unsteadiness on feet (R26.81);Other abnormalities of gait and mobility (R26.89);Muscle weakness (generalized) (M62.81);Difficulty in walking, not elsewhere classified (R26.2)     Time: 6659-9357 PT Time  Calculation (min) (ACUTE ONLY): 30 min  Charges:  $Therapeutic Exercise: 8-22 mins $Therapeutic Activity: 8-22 mins                    Sydelle Sherfield E, PT Acute Rehabilitation Services Pager [phone]  Office [phone]   Eashan Schipani D Javiel Canepa 06/04/2020, 1:17 PM

## 2020-06-05 LAB — BASIC METABOLIC PANEL
Anion gap: 7 (ref 5–15)
BUN: 13 mg/dL (ref 6–20)
CO2: 22 mmol/L (ref 22–32)
Calcium: 8.1 mg/dL — ABNORMAL LOW (ref 8.9–10.3)
Chloride: 106 mmol/L (ref 98–111)
Creatinine, Ser: 1.06 mg/dL (ref 0.61–1.24)
GFR calc Af Amer: >60 mL/min (ref >60)
GFR calc non Af Amer: >60 mL/min (ref >60)
Glucose, Bld: 100 mg/dL — ABNORMAL HIGH (ref 70–99)
Potassium: 3.2 mmol/L — ABNORMAL LOW (ref 3.5–5.1)
Sodium: 135 mmol/L (ref 135–145)

## 2020-06-05 LAB — CBC
HCT: 26.4 % — ABNORMAL LOW (ref 39.0–52.0)
Hemoglobin: 8.1 g/dL — ABNORMAL LOW (ref 13.0–17.0)
MCH: 27 pg (ref 26.0–34.0)
MCHC: 30.7 g/dL (ref 30.0–36.0)
MCV: 88 fL (ref 80.0–100.0)
Platelets: 269 10*3/uL (ref 150–400)
RBC: 3 MIL/uL — ABNORMAL LOW (ref 4.22–5.81)
RDW: 20 % — ABNORMAL HIGH (ref 11.5–15.5)
WBC: 15.5 10*3/uL — ABNORMAL HIGH (ref 4.0–10.5)
nRBC: 0.2 % (ref 0.0–0.2)

## 2020-06-05 MED ORDER — ACETAMINOPHEN 10 MG/ML IV SOLN
1000.0000 mg | Freq: Once | INTRAVENOUS | Status: AC
  Administered 2020-06-05: 1000 mg via INTRAVENOUS
  Filled 2020-06-05: qty 100

## 2020-06-05 MED ORDER — IBUPROFEN 600 MG PO TABS: 800.0000 mg | ORAL_TABLET | Freq: Four times a day (QID) | ORAL | Status: DC | PRN

## 2020-06-05 MED ORDER — ACETAMINOPHEN 325 MG PO TABS
650.0000 mg | ORAL_TABLET | Freq: Four times a day (QID) | ORAL | Status: DC | PRN
  Administered 2020-06-05 – 2020-06-07 (×5): 650 mg via ORAL
  Filled 2020-06-05 (×5): qty 2

## 2020-06-05 MED ORDER — NICOTINE 21 MG/24HR TD PT24
21.0000 mg | MEDICATED_PATCH | Freq: Every day | TRANSDERMAL | Status: DC
  Administered 2020-06-05 – 2020-06-07 (×3): 21 mg via TRANSDERMAL
  Filled 2020-06-05 (×3): qty 1

## 2020-06-05 NOTE — Progress Notes (Addendum)
PROGRESS NOTE  [name] YFV:[id number] DOB: May 09, 1981 DOA: 05/23/2020 PCP: [facility]  HPI/Recap of past 51 hours: 39 year old male admitted from the emergency room on 6/11 after presenting with fall and reportedly having several days of seizures. The emergency room, he became confused and agitated due to alcohol withdrawal and methamphetamine intoxication.  Neurology was consulted and EEG performed as patient has a history of focal seizures and look to be presenting in status epilepticus.  No seizures noted on EEG.  Started on Keppra 750.  He was also found to have a left femur fracture as well as rhabdomyolysis on admission.  He was intubated on admission for airway protection.  Patient was initially admitted to the critical care service and underwent surgery on 6/12.  Patient self extubated on 6/18.  Maxillofacial CT was performed and the patient was evaluated by ENT for possibly orbital fracture. The patient was also found to have left sided acute sinusitis. ENT recommended antibiotic treatment. The patient was transferred out of the ICU yesterday. He continues to suffer from delirium. SLP was consulted to perform a cognitive eval & he was cleared for a regular diet with thin liquids. He has been non-compliant with medications. He remains on a CIWA protocol.   Blood cultures from 6/11 grew out staph heamolyticus. Repeat cultures drawn on 05/28/2020 grew out Staph hominus. Repeat cultures drawn on 05/30/2020 have had no growth as of yet.  Infectious disease has been consulted. The patient received 10 days of vancomycin and 5 days of cefepime. On 6/22, ID felt staph hominus is a contaminant &Vancomycin was discontinued.  Since the patient has come out of the ICU he has remained delirious. ID and PT have both recommended SLP eval for cognition and language. The patient is WBAT on his left lower extremity. PT/OT saw patient and recommending CIR.  Inpatient rehab may possibly  take this patient if his cognition improves and he is able to participate more and more physical therapy.  Patient about the same from previous day.  He states that he is okay and needs to go to the store.  Still with some underlying confusion.  However, PT does report the ability to continue to participate and work with them I do feel he is capable of working with CIR.  Assessment/Plan: Acute respiratory failure due to airway compromise requiring intubation: Resolved. Self-extubated 6/17. The patient is currently saturating 100% on room air.   Status epilepticus: EEG demonstrated no seizure activity. However, the patient does have a known seizure disorder and had been noncompliant with his seizure medications. He also had been abusing methamphetamines. Seizure potential was further complicated by ETOH withdrawal. Neurology recommended continuation of Keppra.  No further seizures since.  Acute metabolic encephalopathy:Methamphetamine intoxication, polysubstance use disorder, Delirium tremens due to alcohol withdrawal. There is also likely a component of his prolonged hospitalization and ICU delirium.  Continue CIWA protocol with as needed haldol. QTc will be checked Q shift. He is being supplemented with thiamine and folate as well as a multivitamin. TOC has been consulted to assist the patient with polysubstance abuse rehab upon discharge. Pt continues to hallucinate.  He did not know he was at [facility].  He thinks that he is okay.  On soft restraints.  Sinusitis, chronic R sided and acute L sided: The patient was receiving IV vancomycin which should be adequate to treat infection as per ENT. I appreciate their help.Cefepime has been discontinued by infectious disease.  Antibiotics now completed.  Staph hominus bacteremia: The patient has received 10 days of vancomycin and 5 days of cefepime. Continue to follow surveillance cultures. No vegetations were appreciated on 05/25/2020 echocardiogram.  ID has determined that staph hominus is a contamnant. Vancomycin has been discontinued.  ID has since signed off.  Anemia: Monitor and transfuse for hemoglobin less than 7.0. Iron studies demonstrate severe iron deficiencies.  Received Feraheme.  Thrombocytopenia, resolved, continue to monitor.  Overweight: Patient meets criteria BMI greater than 25  Hypokalemia: Replacing as needed.  Potassium 3.2 this morning.  Hypomagnesemia: Supplement and monitor  Hyponatremia: Resolved.  Sodium today at 135  Left femur fracture - s/p surgical repair 6/13 (cephalomedullary nailing): Post op care per ortho. Pt is WBAT on Lt.  Inadequate PO intake: Being followed by nutrition.  On Ensure twice daily.  Starting to eat more   Code Status: Full code  Family Communication: Left message with sister  Disposition Plan: Potential discharge to inpatient rehab   Consultants:  Critical care  Infectious disease  Neurology  Physical medicine rehab  Orthopedic surgery  Procedures:  On the ventilator 6/11-6/18  EEG  Status post IM nail of left femur  Antimicrobials:  IV cefepime and vancomycin-course complete  DVT prophylaxis: SCDs   Objective: Vitals:   06/05/20 0747 06/05/20 1220  BP: 101/67 120/65  Pulse: 98   Resp: 18 (!) 23  Temp: 98.7 F (37.1 C) 98 F (36.7 C)  SpO2: 100% 98%    Intake/Output Summary (Last 24 hours) at 06/05/2020 1229 Last data filed at 06/05/2020 1100 Gross per 24 hour  Intake 320 ml  Output 1100 ml  Net -780 ml   Filed Weights   05/31/20 0500 06/01/20 0339 06/02/20 0500  Weight: 77.8 kg 84.3 kg 83.5 kg   Body mass index is 27.99 kg/m.  Exam: No change from previous day.  General: Oriented x1, no acute distress  Cardiovascular: Regular rate and rhythm, S1-S2  Respiratory: Clear to auscultation bilaterally  Abdomen: Soft, nontender, nondistended, positive bowel sounds  Musculoskeletal: No clubbing or cyanosis or edema, status  post left BKA  Psychiatry: Acutely delirious, agitated   Data Reviewed: CBC: Recent Labs  Lab 05/30/20 1252 05/31/20 1200 06/02/20 0347 06/04/20 0234 06/05/20 0323  WBC 15.6* 18.8* 18.5* 17.7* 15.5*  NEUTROABS 12.3* 14.1* 11.9* 11.9*  --   HGB 8.4* 8.3* 7.7* 8.2* 8.1*  HCT 27.1* 27.5* 25.8* 26.4* 26.4*  MCV 86.9 88.1 89.0 87.7 88.0  PLT 221 231 236 237 502   Basic Metabolic Panel: Recent Labs  Lab 05/30/20 0522 05/31/20 0621 06/01/20 0539 06/02/20 0347 06/05/20 0323  NA 137 138 138 140 135  K 3.3* 3.3* 3.6 3.1* 3.2*  CL 101 103 105 109 106  CO2 28 25 24 22 22   GLUCOSE 118* 113* 101* 112* 100*  BUN 12 18 18 15 13   CREATININE 0.94 1.17 1.06 1.05 1.06  CALCIUM 8.4* 8.3* 8.3* 8.2* 8.1*  MG  --   --  1.7  --   --   PHOS 3.6 3.1 2.6  --   --    GFR: Estimated Creatinine Clearance: 97.5 mL/min (by C-G formula based on SCr of 1.06 mg/dL). Liver Function Tests: Recent Labs  Lab 05/30/20 0522 05/31/20 1200  AST 118* 130*  ALT 38 42  ALKPHOS 143* 157*  BILITOT 2.3* 1.8*  PROT 6.5 7.0  ALBUMIN 2.0* 2.0*   Recent Labs  Lab 05/31/20 1200  LIPASE 52*  AMYLASE 79   No results for input(s): AMMONIA  in the last 168 hours. Coagulation Profile: No results for input(s): INR, PROTIME in the last 168 hours. Cardiac Enzymes: No results for input(s): CKTOTAL, CKMB, CKMBINDEX, TROPONINI in the last 168 hours. BNP (last 3 results) No results for input(s): PROBNP in the last 8760 hours. HbA1C: No results for input(s): HGBA1C in the last 72 hours. CBG: Recent Labs  Lab 06/03/20 1611 06/03/20 1916 06/03/20 2327 06/04/20 0307 06/04/20 0734  GLUCAP 89 106* 113* 122* 103*   Lipid Profile: No results for input(s): CHOL, HDL, LDLCALC, TRIG, CHOLHDL, LDLDIRECT in the last 72 hours. Thyroid Function Tests: No results for input(s): TSH, T4TOTAL, FREET4, T3FREE, THYROIDAB in the last 72 hours. Anemia Panel: Recent Labs    06/02/20 1524  FOLATE 17.5  FERRITIN 130    TIBC 315  IRON 31*   Urine analysis:    Component Value Date/Time   COLORURINE STRAW (A) 05/28/2020 1057   APPEARANCEUR CLEAR 05/28/2020 1057   LABSPEC <1.005 (L) 05/28/2020 1057   PHURINE 6.0 05/28/2020 1057   GLUCOSEU NEGATIVE 05/28/2020 1057   HGBUR NEGATIVE 05/28/2020 1057   BILIRUBINUR NEGATIVE 05/28/2020 1057   KETONESUR NEGATIVE 05/28/2020 1057   PROTEINUR NEGATIVE 05/28/2020 1057   UROBILINOGEN 0.2 01/27/2013 0727   NITRITE NEGATIVE 05/28/2020 1057   LEUKOCYTESUR NEGATIVE 05/28/2020 1057   Sepsis Labs: @LABRCNTIP (procalcitonin:4,lacticidven:4)  ) Recent Results (from the past 240 hour(s))  Culture, respiratory (non-expectorated)     Status: None   Collection Time: 05/28/20  8:07 AM   Specimen: Tracheal Aspirate; Respiratory  Result Value Ref Range Status   Specimen Description TRACHEAL ASPIRATE  Final   Special Requests NONE  Final   Gram Stain   Final    RARE WBC PRESENT,BOTH PMN AND MONONUCLEAR NO ORGANISMS SEEN    Culture   Final    RARE Consistent with normal respiratory flora. Performed at [facility], [place] [address] [place], [place] [address]    Report Status 05/30/2020 FINAL  Final  Culture, blood (Routine X 2) w Reflex to ID Panel     Status: Abnormal   Collection Time: 05/28/20  9:06 AM   Specimen: BLOOD LEFT ARM  Result Value Ref Range Status   Specimen Description BLOOD LEFT ARM  Final   Special Requests   Final    BOTTLES DRAWN AEROBIC ONLY Blood Culture adequate volume   Culture  Setup Time   Final    AEROBIC BOTTLE ONLY GRAM POSITIVE COCCI IN CLUSTERS CRITICAL VALUE NOTED.  VALUE IS CONSISTENT WITH PREVIOUSLY REPORTED AND CALLED VALUE. Performed at [facility], [place] [address] [place], [place] [address]    Culture STAPHYLOCOCCUS HOMINIS (A)  Final   Report Status 05/31/2020 FINAL  Final   Organism ID, Bacteria STAPHYLOCOCCUS HOMINIS  Final      Susceptibility   Staphylococcus hominis - MIC*    CIPROFLOXACIN <=0.5  SENSITIVE Sensitive     ERYTHROMYCIN >=8 RESISTANT Resistant     GENTAMICIN <=0.5 SENSITIVE Sensitive     OXACILLIN >=4 RESISTANT Resistant     TETRACYCLINE >=16 RESISTANT Resistant     VANCOMYCIN <=0.5 SENSITIVE Sensitive     TRIMETH/SULFA 80 RESISTANT Resistant     CLINDAMYCIN 0.5 SENSITIVE Sensitive     RIFAMPIN <=0.5 SENSITIVE Sensitive     Inducible Clindamycin NEGATIVE Sensitive     * STAPHYLOCOCCUS HOMINIS  Culture, blood (Routine X 2) w Reflex to ID Panel     Status: Abnormal   Collection Time: 05/28/20  9:06 AM  Specimen: BLOOD LEFT ARM  Result Value Ref Range Status   Specimen Description BLOOD LEFT ARM  Final   Special Requests   Final    BOTTLES DRAWN AEROBIC ONLY Blood Culture adequate volume   Culture  Setup Time   Final    AEROBIC BOTTLE ONLY GRAM POSITIVE COCCI CRITICAL VALUE NOTED.  VALUE IS CONSISTENT WITH PREVIOUSLY REPORTED AND CALLED VALUE. Performed at [facility], [place] [address] [place], [place] [address]    Culture STAPHYLOCOCCUS HOMINIS (A)  Final   Report Status 06/03/2020 FINAL  Final   Organism ID, Bacteria STAPHYLOCOCCUS HOMINIS  Final      Susceptibility   Staphylococcus hominis - MIC*    CIPROFLOXACIN <=0.5 SENSITIVE Sensitive     ERYTHROMYCIN >=8 RESISTANT Resistant     GENTAMICIN <=0.5 SENSITIVE Sensitive     OXACILLIN 1 RESISTANT Resistant     TETRACYCLINE >=16 RESISTANT Resistant     VANCOMYCIN <=0.5 SENSITIVE Sensitive     TRIMETH/SULFA 80 RESISTANT Resistant     CLINDAMYCIN 0.5 SENSITIVE Sensitive     RIFAMPIN <=0.5 SENSITIVE Sensitive     Inducible Clindamycin NEGATIVE Sensitive     * STAPHYLOCOCCUS HOMINIS  Culture, Urine     Status: None   Collection Time: 05/28/20 10:57 AM   Specimen: Urine, Random  Result Value Ref Range Status   Specimen Description URINE, RANDOM  Final   Special Requests NONE  Final   Culture   Final    NO GROWTH Performed at [facility], [place] [address] [place], [place] [address]    Report  Status 05/29/2020 FINAL  Final  Culture, blood (routine x 2)     Status: None   Collection Time: 05/30/20 12:52 PM   Specimen: BLOOD LEFT ARM  Result Value Ref Range Status   Specimen Description BLOOD LEFT ARM  Final   Special Requests   Final    BOTTLES DRAWN AEROBIC AND ANAEROBIC Blood Culture adequate volume   Culture   Final    NO GROWTH 5 DAYS Performed at [facility], [place] [address] [place], [place] [address]    Report Status 06/04/2020 FINAL  Final  Culture, blood (routine x 2)     Status: None   Collection Time: 05/30/20 12:55 PM   Specimen: BLOOD LEFT ARM  Result Value Ref Range Status   Specimen Description BLOOD LEFT ARM  Final   Special Requests   Final    BOTTLES DRAWN AEROBIC AND ANAEROBIC Blood Culture adequate volume   Culture   Final    NO GROWTH 5 DAYS Performed at [facility], [place] [address] [place], [place] [address]    Report Status 06/04/2020 FINAL  Final      Studies: No results found.  Scheduled Meds: . sodium chloride   Intravenous Once  . cholecalciferol  2,000 Units Per Tube Daily  . enoxaparin (LOVENOX) injection  40 mg Subcutaneous Q24H  . feeding supplement (ENSURE ENLIVE)  237 mL Oral BID BM  . folic acid  1 mg Per Tube Daily  . furosemide  40 mg Intravenous Daily  . mouth rinse  15 mL Mouth Rinse q morning - 10a  . multivitamin with minerals  1 tablet Per Tube Daily  . pantoprazole (PROTONIX) IV  40 mg Intravenous Q24H  . pneumococcal 23 valent vaccine  0.5 mL Intramuscular Tomorrow-1000  . sodium chloride flush  10-40 mL Intracatheter Q12H  . thiamine  100 mg Per Tube  Daily  . Vitamin D (Ergocalciferol)  50,000 Units Oral Q7 days    Continuous Infusions: . sodium chloride 10 mL/hr at 06/02/20 2026  . levETIRAcetam 750 mg (06/05/20 0950)     LOS: 13 days     Annita Brod, MD [facility]   06/05/2020, 12:29 PM

## 2020-06-05 NOTE — Progress Notes (Addendum)
Occupational Therapy Treatment Patient Details Name: [name] MRN: [id number] DOB: 04/04/1981 Today's Date: 06/05/2020    History of present illness Pt 39 y.o. male admitted on 05/23/20 for recent fall resulting in L femur fx and seizures.  CT head 05/23/20 atrophy. Intubated 05/23/20 in ED-extubated on 05/29/20. s/p L hip IM nail surgery 05/25/20. EEG 05/25/20 mod to diffuse severe encephalopathy. CT Maxillofacial 05/29/20  severe mucosal thickening of R maxillary sinus, probable chronic sinusitis, L sided acute sinusitis. PMH L BKA, ETOH use, seizures.   OT comments  Pt progressing well with goals and goals updated accordingly. Pt able to converse appropriately with therapist, though tangential. Pt with high distractibility, required cues to stay on task and initiate problem-solving. Pt Supervision for bed mobility, supervision <> min guard for sit to stand at bedside with RW during ADLs. Pt did tend to pull on RW for sit to stands, so therapist needed to stabilize RW for pt safety. Pt complete full ADLs sitting EOB overall supervision-setup for UB ADLs. Pt overall Min A for LB bathing/dressing with difficulty maintaining standing balance and difficulty managing clothing around R LE. Continue to recommend CIR for inpatient rehab based on deficits and progress made to maximize safety at home.    Follow Up Recommendations  CIR;Supervision/Assistance - 24 hour    Equipment Recommendations  Other (comment) (RW)    Recommendations for Other Services Rehab consult    Precautions / Restrictions Precautions Precautions: Fall Precaution Comments: restless, agitated Restrictions Weight Bearing Restrictions: Yes LLE Weight Bearing: Weight bearing as tolerated       Mobility Bed Mobility Overal bed mobility: Needs Assistance Bed Mobility: Supine to Sit;Sit to Supine     Supine to sit: Supervision Sit to supine: Supervision   General bed mobility comments: supervision for  safety  Transfers Overall transfer level: Needs assistance Equipment used: Rolling walker (2 wheeled) Transfers: Sit to/from Stand Sit to Stand: Supervision         General transfer comment: supervision for safety but pt declines physical assistance    Balance Overall balance assessment: Needs assistance Sitting-balance support: Bilateral upper extremity supported;Feet supported Sitting balance-Leahy Scale: Good     Standing balance support: Single extremity supported;During functional activity Standing balance-Leahy Scale: Poor Standing balance comment: needs one handed support on DME to decrease fall risk in standing                           ADL either performed or assessed with clinical judgement   ADL Overall ADL's : Needs assistance/impaired     Grooming: Set up;Supervision/safety;Sitting;Wash/dry face;Wash/dry hands;Applying deodorant Grooming Details (indicate cue type and reason): Completed tasks after setup sitting EOB, good sitting balance Upper Body Bathing: Set up;Sitting;Supervision/ safety Upper Body Bathing Details (indicate cue type and reason): Supervision for safety to avoid IV lines when bathing, good balance sitting EOB Lower Body Bathing: Minimal assistance;Sitting/lateral leans;Sit to/from stand Lower Body Bathing Details (indicate cue type and reason): Min A to maintain balance in standing and hold RW steady for pt in standing Upper Body Dressing : Set up;Sitting Upper Body Dressing Details (indicate cue type and reason): Setup to don/doff shirt sitting EOB Lower Body Dressing: Minimal assistance;Sit to/from stand Lower Body Dressing Details (indicate cue type and reason): Min A requied to don pants around L foot               General ADL Comments: Pt requires cues to stay on task, provide problem solving  cues, but much improved in physical abilities to complete ADLs     Vision   Vision Assessment?: No apparent visual deficits    Perception     Praxis      Cognition Arousal/Alertness: Awake/alert Behavior During Therapy: Restless;Impulsive;Agitated (agitated) Overall Cognitive Status: Impaired/Different from baseline Area of Impairment: Orientation;Attention;Memory;Following commands;Safety/judgement;Awareness;Problem solving                 Orientation Level: Time;Situation Current Attention Level: Focused Memory: Decreased short-term memory;Decreased recall of precautions Following Commands: Follows one step commands inconsistently;Follows one step commands with increased time;Follows multi-step commands inconsistently Safety/Judgement: Decreased awareness of safety;Decreased awareness of deficits Awareness: Intellectual Problem Solving: Requires tactile cues;Requires verbal cues General Comments: Pt impulsive, high distractibility and difficulty attending to OT instruction for session. Pt focsused on what he wants to do and self leads session        Exercises     Shoulder Instructions       General Comments HR sustained 120s with activity sitting EOB, 130s standing, Educated pt on rest breaks if HR increased any more. After rest break, decreased to 72 bpm    Pertinent Vitals/ Pain       Pain Assessment: Faces Faces Pain Scale: Hurts a little bit Pain Location: LLE Pain Descriptors / Indicators: Grimacing;Discomfort;Guarding Pain Intervention(s): Monitored during session;Limited activity within patient's tolerance  Home Living   Living Arrangements:  (girlfriend, [place])           Home Layout: Two level;Able to live on main level with bedroom/bathroom     Bathroom Shower/Tub: Teacher, early years/pre: Standard Bathroom Accessibility: Yes How Accessible: Accessible via walker        Lives With: Significant other    Prior Functioning/Environment              Frequency  Min 2X/week        Progress Toward Goals  OT Goals(current goals can now be found in  the care plan section)  Progress towards OT goals: Progressing toward goals  Acute Rehab OT Goals Patient Stated Goal: go home OT Goal Formulation: With patient Time For Goal Achievement: 06/16/20 Potential to Achieve Goals: Good ADL Goals Pt Will Perform Eating: with modified independence;sitting Pt Will Perform Grooming: with supervision;sitting Pt Will Perform Upper Body Bathing: with modified independence;sitting Pt Will Perform Lower Body Bathing: with supervision;sitting/lateral leans;sit to/from stand Additional ADL Goal #1: Pt to demonstrate ability to maintain sitting balance with no more than Min A during ADL tasks for 5 minutes. Additional ADL Goal #2: Pt to demonstrate ability to follow one step commands 75% of the time in order to maximize independence with daily tasks.  Plan Discharge plan remains appropriate    Co-evaluation                 AM-PAC OT "6 Clicks" Daily Activity     Outcome Measure   Help from another person eating meals?: A Little Help from another person taking care of personal grooming?: A Little Help from another person toileting, which includes using toliet, bedpan, or urinal?: A Lot Help from another person bathing (including washing, rinsing, drying)?: A Little Help from another person to put on and taking off regular upper body clothing?: A Little Help from another person to put on and taking off regular lower body clothing?: A Little 6 Click Score: 17    End of Session Equipment Utilized During Treatment: Rolling walker  OT Visit Diagnosis: Unsteadiness on feet (R26.81);Muscle weakness (  generalized) (M62.81);History of falling (Z91.81);Other symptoms and signs involving cognitive function   Activity Tolerance Patient tolerated treatment well;Other (comment) (limited by cognition)   Patient Left in bed;with call bell/phone within reach;with bed alarm set;with restraints reapplied   Nurse Communication          Time:  0037-0488 OT Time Calculation (min): 44 min  Charges: OT General Charges $OT Visit: 1 Visit OT Treatments $Self Care/Home Management : 38-52 mins  Layla Maw, OTR/L   Layla Maw 06/05/2020, 3:59 PM

## 2020-06-05 NOTE — Progress Notes (Addendum)
Physical Therapy Treatment Patient Details Name: [name] MRN: [id number] DOB: September 23, 1981 Today's Date: 06/05/2020    History of Present Illness Pt 39 y.o. male admitted on 05/23/20 for recent fall resulting in L femur fx and seizures.  CT head 05/23/20 atrophy. Intubated 05/23/20 in ED-extubated on 05/29/20. s/p L hip IM nail surgery 05/25/20. EEG 05/25/20 mod to diffuse severe encephalopathy. CT Maxillofacial 05/29/20  severe mucosal thickening of R maxillary sinus, probable chronic sinusitis, L sided acute sinusitis. PMH L BKA, ETOH use, seizures.    PT Comments    Pt was more alert and able to follow commands today.  He was able to ambulate to door and back twice today, but required significant cues for safety, mod A for balance with turns, and fatigued easily with HR up to 135 bpm with walking.  Pt with poor safety awareness and wanting therapy to tell MD he is safe to return home.  From PT perspective, pt still with good rehab potential if willing to participate. PT continues to recommend CIR at this time (notified MD). Pt wanting to use crutches instead of RW - from PT perspective likely improved safety with RW, but if pt more willing to use crutches consider crutch training.     Follow Up Recommendations  CIR     Equipment Recommendations  Other (comment) (RW vs crutches (RW at this time))    Recommendations for Other Services       Precautions / Restrictions Precautions Precautions: Fall Restrictions LLE Weight Bearing: Weight bearing as tolerated    Mobility  Bed Mobility Overal bed mobility: Needs Assistance Bed Mobility: Supine to Sit;Sit to Supine     Supine to sit: Supervision Sit to supine: Supervision   General bed mobility comments: supervision for safety  Transfers Overall transfer level: Needs assistance Equipment used: Rolling walker (2 wheeled) Transfers: Sit to/from Stand Sit to Stand: Min assist;+2 safety/equipment         General transfer  comment: cues to push up with hand and for use of RW; performed x 3  Ambulation/Gait Ambulation/Gait assistance: Mod assist Gait Distance (Feet): 30 Feet (x2) Assistive device: Rolling walker (2 wheeled) Gait Pattern/deviations: Step-through pattern;Trunk flexed Gait velocity: decreased   General Gait Details: Pt circumducts L leg during swing phase.  Cued for posture and RW use.  Pt had LOB with both turns requiring mod A to maintain balance.   Stairs             Wheelchair Mobility    Modified Rankin (Stroke Patients Only)       Balance Overall balance assessment: Needs assistance Sitting-balance support: Bilateral upper extremity supported;Feet supported Sitting balance-Leahy Scale: Good Sitting balance - Comments: Pt donned prosthetic at EOB - of note prosthetic does not lock in with pin or suction, depends on socks to secure   Standing balance support: Bilateral upper extremity supported;During functional activity Standing balance-Leahy Scale: Poor Standing balance comment: Had LOB with walking requiring mod A                            Cognition Arousal/Alertness: Awake/alert Behavior During Therapy: Impulsive (pt agitated that therapist won't say he can go home) Overall Cognitive Status: Impaired/Different from baseline                         Following Commands: Follows multi-step commands inconsistently;Follows one step commands consistently Safety/Judgement: Decreased awareness of safety;Decreased awareness of  deficits   Problem Solving: Requires tactile cues;Requires verbal cues General Comments: Pt wanting therapist to tell MD that he is safe to go home.  Reports his significant other and her 39 and 39 yo can take care of him.  Discussed decreased balance and safety and that still needs further therapy , at that point pt quit talking to therapist.      Exercises General Exercises - Lower Extremity Long Arc Quad: AROM;10  reps;Left;Supine Hip ABduction/ADduction: AROM;Both;10 reps;Supine Straight Leg Raises: Left;10 reps;Supine;AAROM    General Comments General comments (skin integrity, edema, etc.): HR 98 bpm rest and up to 135 bpm walking and pt with fatigue requiring rest breaks.  Pt insisting that he can return home with family (significant other and her children).  PT educated on poor safety, need for assistance to prevent falls, need for further therapy to work on walking.  Pt also want crutches instead of RW - discussed need further training with crutches.  Pt became upset with therapy because would not recommend home.      Pertinent Vitals/Pain Pain Assessment: Faces Faces Pain Scale: Hurts a little bit Pain Location: LLE Pain Descriptors / Indicators: Grimacing;Discomfort;Guarding Pain Intervention(s): Limited activity within patient's tolerance;Monitored during session    Home Living                      Prior Function            PT Goals (current goals can now be found in the care plan section) Acute Rehab PT Goals Patient Stated Goal: go home PT Goal Formulation: With patient Time For Goal Achievement: 06/15/20 Progress towards PT goals: Progressing toward goals    Frequency    Min 5X/week      PT Plan Current plan remains appropriate    Co-evaluation              AM-PAC PT "6 Clicks" Mobility   Outcome Measure  Help needed turning from your back to your side while in a flat bed without using bedrails?: A Little Help needed moving from lying on your back to sitting on the side of a flat bed without using bedrails?: A Little Help needed moving to and from a bed to a chair (including a wheelchair)?: A Little Help needed standing up from a chair using your arms (e.g., wheelchair or bedside chair)?: A Little Help needed to walk in hospital room?: A Lot Help needed climbing 3-5 steps with a railing? : A Lot 6 Click Score: 16    End of Session Equipment Utilized  During Treatment: Gait belt Activity Tolerance: Patient tolerated treatment well Patient left: in bed;with call bell/phone within reach;with bed alarm set Nurse Communication: Mobility status PT Visit Diagnosis: Unsteadiness on feet (R26.81);Other abnormalities of gait and mobility (R26.89);Muscle weakness (generalized) (M62.81);Difficulty in walking, not elsewhere classified (R26.2)     Time: 1103-1130 PT Time Calculation (min) (ACUTE ONLY): 27 min  Charges:  $Gait Training: 8-22 mins $Therapeutic Activity: 8-22 mins                     [name], PT Acute Rehab Services Pager [phone] [facility] [place] 06/05/2020, 11:51 AM

## 2020-06-05 NOTE — Progress Notes (Signed)
Inpatient Rehabilitation Admissions Coordinator  I met at bedside with patient . I discussed possible need for more rehab before returning home with girlfriend, [place].. He states he does not need this and just needs to go to the store, walk around and prove to [place] that he can use crutches. He is currently not in agreement to admit to CIR. Cir bed is also not available for this admit. We will follow his progress.  Danne Baxter, RN, MSN Rehab Admissions Coordinator [phone] 06/05/2020 12:41 PM

## 2020-06-06 LAB — VITAMIN B1: Vitamin B1 (Thiamine): 149.3 nmol/L (ref 66.5–200.0)

## 2020-06-06 MED ORDER — BOOST / RESOURCE BREEZE PO LIQD CUSTOM
1.0000 | Freq: Three times a day (TID) | ORAL | Status: DC
  Administered 2020-06-06 (×2): 1 via ORAL

## 2020-06-06 NOTE — Progress Notes (Addendum)
Physical Therapy Treatment Patient Details Name: [name] MRN: [id number] DOB: 1981/02/12 Today's Date: 06/06/2020    History of Present Illness Pt 39 y.o. male admitted on 05/23/20 for recent fall resulting in L femur fx and seizures.  CT head 05/23/20 atrophy. Intubated 05/23/20 in ED-extubated on 05/29/20. s/p L hip IM nail surgery 05/25/20. EEG 05/25/20 mod to diffuse severe encephalopathy. CT Maxillofacial 05/29/20  severe mucosal thickening of R maxillary sinus, probable chronic sinusitis, L sided acute sinusitis. PMH L BKA, ETOH use, seizures.    PT Comments    Pt calm and cooperative during PT today, ambulating x3 room distance with use of axillary crutches and multimodal cuing for form/safety. Pt still with decreased safety awareness and lack of insight into deficits, but is set on d/c home. Pt demonstrated HEP well with min PT cuing, states "I do these every morning". PT recommending axillary crutches for home use, and PT spoke to pt about close supervision from girlfriend upon d/c during mobility. Pt agreeable. Per chart review and pt account, pt going home with HHPT at this time. Will continue to follow acutely.    Follow Up Recommendations  CIR (pt refusing - home with HHPT)     Equipment Recommendations  Crutches    Recommendations for Other Services       Precautions / Restrictions Precautions Precautions: Fall Precaution Comments: restless, agitated - appears resolved Restrictions Weight Bearing Restrictions: No LLE Weight Bearing: Weight bearing as tolerated (with prosthesis donned)    Mobility  Bed Mobility Overal bed mobility: Needs Assistance Bed Mobility: Supine to Sit;Sit to Supine     Supine to sit: Supervision Sit to supine: Supervision   General bed mobility comments: for safety  Transfers Overall transfer level: Needs assistance Equipment used: Crutches Transfers: Sit to/from Stand Sit to Stand: Min guard         General transfer comment:  close guard for safety, verbal cuing for standing with crutches using handrests as opposed to arm rest of crutches, having girlfriend be standby assist and assisting with correct crutch placement upon standing. pt performs correctly 2/3 trials.  Ambulation/Gait Ambulation/Gait assistance: Min guard Gait Distance (Feet): 30 Feet (x3) Assistive device: Crutches Gait Pattern/deviations: Step-to pattern;Decreased step length - right;Decreased step length - left;Trunk flexed Gait velocity: decr   General Gait Details: Min guard for safety, verbal cuing for upright posture, sequencing with step-to gait with crutches. No LOB this session, encouraged to take his time for safety.   Stairs             Wheelchair Mobility    Modified Rankin (Stroke Patients Only)       Balance Overall balance assessment: Needs assistance Sitting-balance support: Bilateral upper extremity supported;Feet supported Sitting balance-Leahy Scale: Good Sitting balance - Comments: Pt donned prosthetic and shoes at EOB (prosthetic does not lock in with pin or suction, depends on socks to secure)   Standing balance support: During functional activity;Bilateral upper extremity supported Standing balance-Leahy Scale: Poor Standing balance comment: able to stand and adjust pants statically; reliant on external assist dynamically                            Cognition Arousal/Alertness: Awake/alert Behavior During Therapy: [facility] for tasks assessed/performed   Area of Impairment: Memory;Following commands;Safety/judgement                     Memory: Decreased short-term memory;Decreased recall of precautions Following Commands: Follows  one step commands consistently;Follows one step commands with increased time Safety/Judgement: Decreased awareness of safety;Decreased awareness of deficits Awareness: Emergent Problem Solving: Requires verbal cues;Requires tactile cues;Difficulty  sequencing General Comments: Pt calm this session, following simple commands well. Pt requires repeated safety cuing throughout mobility, especially with use of axillary crutches. PT cued pt for proper form when rising and sitting, could not recall when asked to demonstrate.      Exercises Amputee Exercises Hip Extension: AROM;Left;10 reps;Supine Hip ABduction/ADduction: AROM;Left;10 reps;Supine Hip Flexion/Marching: AROM;Left;10 reps;Supine Knee Extension: AROM;Left;10 reps;Supine    General Comments        Pertinent Vitals/Pain Pain Assessment: Faces Faces Pain Scale: Hurts a little bit Pain Location: LLE Pain Descriptors / Indicators: Grimacing;Discomfort;Other (Comment) ("weak") Pain Intervention(s): Limited activity within patient's tolerance;Monitored during session;Repositioned    Home Living                      Prior Function            PT Goals (current goals can now be found in the care plan section) Acute Rehab PT Goals Patient Stated Goal: go home PT Goal Formulation: With patient Time For Goal Achievement: 06/15/20 Progress towards PT goals: Progressing toward goals    Frequency    Min 5X/week      PT Plan Equipment recommendations need to be updated    Co-evaluation              AM-PAC PT "6 Clicks" Mobility   Outcome Measure  Help needed turning from your back to your side while in a flat bed without using bedrails?: A Little Help needed moving from lying on your back to sitting on the side of a flat bed without using bedrails?: A Little Help needed moving to and from a bed to a chair (including a wheelchair)?: A Little Help needed standing up from a chair using your arms (e.g., wheelchair or bedside chair)?: A Little Help needed to walk in hospital room?: A Lot Help needed climbing 3-5 steps with a railing? : A Lot 6 Click Score: 16    End of Session Equipment Utilized During Treatment: Gait belt Activity Tolerance: Patient  tolerated treatment well Patient left: in bed;with call bell/phone within reach;with bed alarm set Nurse Communication: Mobility status PT Visit Diagnosis: Muscle weakness (generalized) (M62.81);Difficulty in walking, not elsewhere classified (R26.2)     Time: 8563-1497 PT Time Calculation (min) (ACUTE ONLY): 25 min  Charges:  $Gait Training: 8-22 mins $Therapeutic Exercise: 8-22 mins                    Ko Bardon E, PT Acute Rehabilitation Services Pager [phone]  Office [phone]    Lovell Nuttall D Elonda Husky 06/06/2020, 4:45 PM

## 2020-06-06 NOTE — Progress Notes (Signed)
Nutrition Follow-up  DOCUMENTATION CODES:   Not applicable  INTERVENTION:  D/c Ensure Enlive  Boost Breeze po TID, each supplement provides 250 kcal and 9 grams of protein  NUTRITION DIAGNOSIS:   Increased nutrient needs related to post-op healing as evidenced by estimated needs.  Ongoing.  GOAL:   Patient will meet greater than or equal to 90% of their needs  Progressing.  MONITOR:   PO intake, Supplement acceptance, Weight trends, Skin, I & O's, Labs  REASON FOR ASSESSMENT:   Consult, Ventilator Enteral/tube feeding initiation and management  ASSESSMENT:   Pt with PMH of ETOH and sz disorder who is noncompliant with medications admitted after drinking binge then alcohol withdrawal seizures and fall at home.  6/11 intubated; xray showed L hip fx; positive for methaphetamines  6/12 OG placed 6/13 s/p L hip fx repair 6/17 extubated, OG removed  6/19 regular diet  Pt unavailable at time of RD visit. Discussed with RN who reports pt not drinking Ensure Enlive.   PO Intake: 25-90% x 2 recorded meals  UOP: 1444ml x24 hours I/O: -5323.79ml since admit  Labs: K+ 3.2 (L) Medications: Vitamin D3, Ensure Enlive BID, Folvite, Lasix, MVI, Protonix, Thiamine, Drisdol  Diet Order:   Diet Order            Diet regular Room service appropriate? No; Fluid consistency: Thin  Diet effective now                 EDUCATION NEEDS:   No education needs have been identified at this time  Skin:  Skin Assessment: Skin Integrity Issues: Skin Integrity Issues:: Incisions Incisions: L Leg  Last BM:  06/04/20  Height:   Ht Readings from Last 1 Encounters:  05/24/20 5\' 8"  (1.727 m)    Weight:   Wt Readings from Last 1 Encounters:  06/06/20 85.4 kg    Ideal Body Weight:  65.4 kg  BMI:  Body mass index is 28.63 kg/m.  Estimated Nutritional Needs:   Kcal:  2400-2600  Protein:  115-130 grams  Fluid:  2.4 L    Larkin Ina, MS, RD, LDN RD pager number  and weekend/on-call pager number located in [place].

## 2020-06-06 NOTE — Progress Notes (Addendum)
Inpatient Rehabilitation-Admissions Coordinator   Our PM&R MD followed up with pt today at the bedside. Pt is adamant about going home and does not want to pursue CIR program. Spoke to pt's sister to notify her of pt's preference for home. She verbalized understanding. Would recommend at least [facility] therapy if pt DC's home. Will notify TOC team regarding pt preference and will sign off.   Raechel Ache, OTR/L  Rehab Admissions Coordinator  [phone] 06/06/2020 3:56 PM

## 2020-06-06 NOTE — Consult Note (Signed)
Physical Medicine and Rehabilitation Consult Reason for Consult: Impaired mobility and ADLs following acute encephalopathy Referring Physician: Karie Kirks   HPI: [name] is a 39 y.o. male who was admitted from the ED on 05/23/20 after several days of seizures and a fall at home. While in the ED he became confused and agitated and required 6mg  of Ativan for alcohol withdrawal and methamphetamine intoxication. Neurology was consulted and an EEG was performed. The patient has a history of focal seizures and presented in status epilepticus in the past due to medication non-compliance and amphetamine use. EEG showed no seizures. He was started on Keppra 750mg  BID. He was also found to have a left femur fracture and rhamdomyolysis. He was admitted and underwent surgery on 05/24/20. He required intubation on admission and self-extubated on 05/30/20. He was found to have left sided sinusitis and was recommended to have antibiotic treatment. His course was complicated by delirium. Blood cultures grew staph haemolyticus. Repeat cultures with staph hominus and repeat cultures with no growth.    Review of Systems  Constitutional: Negative for chills and fever.  HENT: Negative for hearing loss and tinnitus.   Eyes: Negative for blurred vision and double vision.  Respiratory: Negative for cough and hemoptysis.   Cardiovascular: Negative for chest pain and palpitations.  Gastrointestinal: Negative for heartburn and nausea.  Genitourinary: Negative for dysuria.  Musculoskeletal: Negative for myalgias and neck pain.  Skin: Negative for itching and rash.  Neurological: Negative for dizziness and headaches.  Endo/Heme/Allergies: Negative for environmental allergies. Does not bruise/bleed easily.  Psychiatric/Behavioral: Negative for memory loss. The patient does not have insomnia.    Past Medical History:  Diagnosis Date  . Alcohol abuse   . Seizures ([place])   . Stroke [facility])    Past Surgical  History:  Procedure Laterality Date  . Above knee amputation of left leg    . BELOW KNEE LEG AMPUTATION     LEFT  . below the knee amputation    . INTRAMEDULLARY (IM) NAIL INTERTROCHANTERIC Left 05/25/2020   Procedure: INTRAMEDULLARY (IM) NAIL INTERTROCHANTRIC;  Surgeon: Shona Needles, MD;  Location: [place];  Service: Orthopedics;  Laterality: Left;   Family History  Problem Relation Age of Onset  . Heart attack Mother        Negative Hx   Social History:  reports that he has been smoking cigarettes. He has a 6.60 pack-year smoking history. He has never used smokeless tobacco. He reports current alcohol use of about 2.0 standard drinks of alcohol per week. He reports that he does not use drugs. Allergies: No Known Allergies Medications Prior to Admission  Medication Sig Dispense Refill  . ibuprofen (ADVIL,MOTRIN) 600 MG tablet Take 1 tablet (600 mg total) by mouth every 8 (eight) hours as needed for moderate pain. (Patient not taking: Reported on 08/18/2018) 21 tablet 0  . levETIRAcetam (KEPPRA) 500 MG tablet Take 1 tablet (500 mg total) by mouth 2 (two) times daily. (Patient not taking: Reported on 05/24/2020) 60 tablet 1    Home: [place] expects to be discharged to:: Private residence Living Arrangements:  (girlfriend, Scientist, forensic) Available Help at Discharge: Family, Available 24 hours/day Type of Home: House Home Access: Level entry Home Layout: Two level, Able to live on main level with bedroom/bathroom Bathroom Shower/Tub: Chiropodist: Standard Bathroom Accessibility: Yes Additional Comments: Per sister, pt's girlfriend is at home with pt 24/7, as she is on disability as wellq  Lives With:  Significant other  Functional History: Prior Function Level of Independence: Independent with assistive device(s) Comments: Per sister, pt was MOD I for ADLs/mobility with prosthetic LE, no use of RW Functional Status:  Mobility: Bed Mobility Overal bed  mobility: Needs Assistance Bed Mobility: Supine to Sit, Sit to Supine Supine to sit: Supervision Sit to supine: Supervision General bed mobility comments: supervision for safety Transfers Overall transfer level: Needs assistance Equipment used: Rolling walker (2 wheeled) Transfers: Sit to/from Stand Sit to Stand: Supervision General transfer comment: supervision for safety but pt declines physical assistance Ambulation/Gait Ambulation/Gait assistance: Mod assist Gait Distance (Feet): 30 Feet (x2) Assistive device: Rolling walker (2 wheeled) Gait Pattern/deviations: Step-through pattern, Trunk flexed General Gait Details: Pt circumducts L leg during swing phase.  Cued for posture and RW use.  Pt had LOB with both turns requiring mod A to maintain balance. Gait velocity: decreased    ADL: ADL Overall ADL's : Needs assistance/impaired Eating/Feeding: Moderate assistance, Bed level Grooming: Set up, Supervision/safety, Sitting, Wash/dry face, Wash/dry hands, Applying deodorant Grooming Details (indicate cue type and reason): Completed tasks after setup sitting EOB, good sitting balance Upper Body Bathing: Set up, Sitting, Supervision/ safety Upper Body Bathing Details (indicate cue type and reason): Supervision for safety to avoid IV lines when bathing, good balance sitting EOB Lower Body Bathing: Minimal assistance, Sitting/lateral leans, Sit to/from stand Lower Body Bathing Details (indicate cue type and reason): Min A to maintain balance in standing and hold RW steady for pt in standing Upper Body Dressing : Set up, Sitting Upper Body Dressing Details (indicate cue type and reason): Setup to don/doff shirt sitting EOB Lower Body Dressing: Minimal assistance, Sit to/from stand Lower Body Dressing Details (indicate cue type and reason): Min A requied to don pants around L foot Toilet Transfer: +2 for physical assistance Toilet Transfer Details (indicate cue type and reason): unsafe to  attempt at this time due to assistance needed to maintain sitting balance  Toileting- Clothing Manipulation and Hygiene: Total assistance, Bed level Functional mobility during ADLs: Total assistance General ADL Comments: Pt requires cues to stay on task, provide problem solving cues, but much improved in physical abilities to complete ADLs  Cognition: Cognition Overall Cognitive Status: Impaired/Different from baseline Arousal/Alertness: Awake/alert Orientation Level: Oriented X4 Attention: Sustained Sustained Attention: Impaired Sustained Attention Impairment: Functional basic, Verbal basic Memory: Impaired Memory Impairment: Storage deficit Awareness: Impaired Awareness Impairment: Intellectual impairment Cognition Arousal/Alertness: Awake/alert Behavior During Therapy: Restless, Impulsive, Agitated (agitated) Overall Cognitive Status: Impaired/Different from baseline Area of Impairment: Orientation, Attention, Memory, Following commands, Safety/judgement, Awareness, Problem solving Orientation Level: Time, Situation Current Attention Level: Focused Memory: Decreased short-term memory, Decreased recall of precautions Following Commands: Follows one step commands inconsistently, Follows one step commands with increased time, Follows multi-step commands inconsistently Safety/Judgement: Decreased awareness of safety, Decreased awareness of deficits Awareness: Intellectual Problem Solving: Requires tactile cues, Requires verbal cues General Comments: Pt impulsive, high distractibility and difficulty attending to OT instruction for session. Pt focsused on what he wants to do and self leads session  Blood pressure 136/70, pulse 75, temperature 98.3 F (36.8 C), temperature source Oral, resp. rate (!) 22, height 5\' 8"  (1.727 m), weight 85.4 kg, SpO2 98 %. Physical Exam   General: No apparent distress HEENT: Head is normocephalic, atraumatic, PERRLA, EOMI, sclera anicteric, oral mucosa  pink and moist, dentition intact, ext ear canals clear,  Neck: Supple without JVD or lymphadenopathy Heart: Reg rate and rhythm. No murmurs rubs or gallops Chest: CTA bilaterally without wheezes,  rales, or rhonchi; no distress Abdomen: Soft, non-tender, non-distended, bowel sounds positive. Extremities: No clubbing, cyanosis, or edema. Pulses are 2+ Skin: Clean and intact without signs of breakdown Neuro: Pt is cognitively appropriate with normal insight, memory, and awareness. Able to state name, off 1 day with date and said he was in [place] but cannot provide name of hospital. States he does not want to come to inpatient rehab as he wants to be able to smoke cigarettes. 5/5 with exception of LLE- pain limited due to fracture.  Psych: Pt's affect is appropriate. Pt is cooperative   No results found for this or any previous visit (from the past 24 hour(s)). No results found.   Assessment/Plan: Mr. Nicolosi has refused CIR. He does have capacity to make this decision. Was able to state name, month, city name, and to provide details of his medical history and with whom he lives. His reason for refusing is that he wants to go home to be able to smoke cigarettes. He is currently on max dose of nicotine patch. He does not want lozenges. He is currently ambulating ModA 30 feet. I will schedule physiatry outpatient follow-up for him for outpatient management of his rehabilitation.  Thank you for this consult.   Izora Ribas, MD 06/06/2020

## 2020-06-06 NOTE — Progress Notes (Addendum)
PROGRESS NOTE  [name] HUT:[id number] DOB: May 06, 1981 DOA: 05/23/2020 PCP: [facility]  Brief History   The patient is a 39 yr old man who was admitted from the ED on 05/23/2020 after several days of seizures and a fall at home. While in the ED the patient became confused and agitated requiring 6 mg of Ativan.  This was due to ETOH withdrawal and methamphetamine intoxication. Neurology was consulted and an EEG performed. 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 There were no seizures on EEG. He was started on Keppra 750 bid.  He was also found to have suffered a left femur fracture, and rhabdomyolysis. He was admitted to [facility] and underwent surgery on 05/24/2020. The patient required intubation for airway protection on admission. He self-extubated on 05/30/2020. Sedation while on vent was difficult. Infectious disease has been consulted. The patient has received 10 days of vancomycin and 5 days of cefepime. Maxillofacial CT was performed and the patient was evaluated by ENT for possibly orbitasl fracture. The patient was also found to have left sided acute sinusitis. ENT recommended antibiotic treatment. The patient was transferred out of the ICU yesterday. He continues to suffer from delirium. SLP has been consulted to perform a cognitive eval. He has been cleared for a regular diet with thin liquids. He has been non-compliant with medications. He remains on a CIWA protocol.   2/2 Blood cultures obtained on 05/23/2020 have grown out staph heamolyticus. Repeat cultures drawn on 05/28/2020 grew out  Staph hominus. Repeat cultures drawn on 05/30/2020 have had no growth as of yet.  Infectious disease has been consulted. The patient has received 10 days of vancomycin and 5 days of cefepime. On 06/03/2020 ID has stated that they believe that the staph hominus is a contaminant. Vancomycin has been  discontinued.  Since the patient has come out of the ICU he has remained delirious. ID and PT have both recommended SLP eval for cognition and language. The patient is WBAT on his left lower extremity. PT/OT has evaluated the patient. CIR has been consulted, but they have determined that the patient is not a candidate for inpatient rehab. TOC has been consulted to find SNF placement.  Consultants   Infectious disease  PCCM  Neurology  Procedures   Mechanical ventilation  EEG  Intermedullary nailing of femur fracture  Antibiotics   Anti-infectives (From admission, onward)   Start     Dose/Rate Route Frequency Ordered Stop   05/30/20 0500  vancomycin (VANCOREADY) IVPB 1250 mg/250 mL  Status:  Discontinued        1,250 mg 166.7 mL/hr over 90 Minutes Intravenous Every 12 hours 05/29/20 2106 06/03/20 1118   05/28/20 0930  ceFEPIme (MAXIPIME) 2 g in sodium chloride 0.9 % 100 mL IVPB  Status:  Discontinued        2 g 200 mL/hr over 30 Minutes Intravenous Every 8 hours 05/28/20 0842 06/02/20 0930   05/26/20 0930  ceFAZolin (ANCEF) IVPB 2g/100 mL premix        2 g 200 mL/hr over 30 Minutes Intravenous Every 8 hours 05/26/20 0852 05/26/20 2346   05/25/20 1406  vancomycin (VANCOCIN) powder  Status:  Discontinued          As needed 05/25/20 1407 05/25/20 1508   05/24/20 0800  vancomycin (VANCOCIN) IVPB 1000 mg/200 mL premix  Status:  Discontinued        1,000 mg 200  mL/hr over 60 Minutes Intravenous Every 12 hours 05/23/20 2126 05/29/20 2106   05/23/20 2200  ceFEPIme (MAXIPIME) 2 g in sodium chloride 0.9 % 100 mL IVPB  Status:  Discontinued        2 g 200 mL/hr over 30 Minutes Intravenous Every 8 hours 05/23/20 2126 05/24/20 1047   05/23/20 1745  ceFEPIme (MAXIPIME) 2 g in sodium chloride 0.9 % 100 mL IVPB  Status:  Discontinued        2 g 200 mL/hr over 30 Minutes Intravenous  Once 05/23/20 1741 05/23/20 2125   05/23/20 1745  vancomycin (VANCOREADY) IVPB 1500 mg/300 mL        1,500  mg 150 mL/hr over 120 Minutes Intravenous  Once 05/23/20 1741 05/23/20 2206   05/23/20 1715  cefTRIAXone (ROCEPHIN) 2 g in sodium chloride 0.9 % 100 mL IVPB  Status:  Discontinued        2 g 200 mL/hr over 30 Minutes Intravenous  Once 05/23/20 1709 05/23/20 1741   05/23/20 1615  piperacillin-tazobactam (ZOSYN) IVPB 3.375 g  Status:  Discontinued        3.375 g 100 mL/hr over 30 Minutes Intravenous  Once 05/23/20 1601 05/23/20 1741     Subjective  The patient is awake and alert resting in bed. He is complaining that he does not like the medication he is given to protect the lining of his stomach. He states that it makes everything small and taste bad.  Objective   Vitals:  Vitals:   06/06/20 0744 06/06/20 1231  BP: 114/66 136/70  Pulse:    Resp: 18 (!) 22  Temp: 98.7 F (37.1 C) 98.3 F (36.8 C)  SpO2: 100% 98%   Exam:  Constitutional:   The patient is awake, alert, but confused. He is more interactive and alert than previous. No acute distress. Respiratory:   No increased work of breathing.  No wheezes, rales, or rhonchi  No tactile fremitus Cardiovascular:   Regular rate and rhythm  No murmurs, ectopy, or gallups.  No lateral PMI. No thrills. Abdomen:   Abdomen is soft, non-tender, non-distended  No hernias, masses, or organomegaly  Normoactive bowel sounds.  Musculoskeletal:   No cyanosis, clubbing, or edema Skin:   No rashes, lesions, ulcers  palpation of skin: no induration or nodules Neurologic:  Patient is moving all extremities. CN II - XII appear grossly intact. Psychiatric:   Unable to evaluate as the patient is unable to cooperate with exam.  I have personally reviewed the following:   Today's Data   Vitals, BMP, CBC, Thiamine  Micro Data   Blood cultures 6/11, 16, and 18.  Imaging   Maxillofacial CT  Other Data   EEG  Scheduled Meds:  sodium chloride   Intravenous Once   cholecalciferol  2,000 Units Per Tube Daily    enoxaparin (LOVENOX) injection  40 mg Subcutaneous Q24H   feeding supplement  1 Container Oral TID BM   folic acid  1 mg Per Tube Daily   furosemide  40 mg Intravenous Daily   mouth rinse  15 mL Mouth Rinse q morning - 10a   multivitamin with minerals  1 tablet Per Tube Daily   nicotine  21 mg Transdermal Daily   pneumococcal 23 valent vaccine  0.5 mL Intramuscular Tomorrow-1000   sodium chloride flush  10-40 mL Intracatheter Q12H   thiamine  100 mg Per Tube Daily   Vitamin D (Ergocalciferol)  50,000 Units Oral Q7 days   Continuous Infusions:  sodium chloride 10 mL/hr at 06/02/20 2026   levETIRAcetam 750 mg (06/06/20 1145)    Principal Problem:   Fever Active Problems:   Alcohol abuse   Hx of BKA, left (HCC)   Steatosis of liver   Thoracic ascending aortic aneurysm (HCC)   Tobacco abuse   Seizure (HCC)   Elective surgery   S/p left hip fracture   Respiratory failure (HCC)   Acute hypoxemic respiratory failure (HCC)   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 Self-extubated 6/17. The patient is currently saturating 100% on room air.   Status epilepticus: EEG demonstrated no seizure activity. However, the patient does have a known seizure disorder and had been noncompliant with his seizure medications. He also had been abusing methamphetamines. Seizure potential has been further complicated by ETOH withdrawal. Neurology ahs recommended continuation of Keppra. He has had no further seizures.  Acute metabolic encephalopathy:Methamphetamine intoxication, polysubstance use disorder, Delirium tremens due to alcohol withdrawal. There is also likely a backdrop of ICU delirium. Continue CIWA protocol with as needed haldol. QTc will be checked Q shift. He is being supplemented with thiamine and  folate as well as a multivitamin. TOC has been consulted to assist the patient with polysubstance abuse rehab upon discharge. Pt continues to hallucinate. Soft restraints continue. The patient has undergone cognitive/linguistic eval by SLP. They feel that the patient continues to demonstrate deficits in awareness, problem solving, memory and reasoning. He continues to believe that pt attempted to poison him, and that people were on top of a building. h will need 24 hour supervision upon discharge.   Sinusitis, chronic R sided and acute L sided: The patient is receiving IV vancomycin which should be adequate to treat infection as per ENT. I appreciate their help. Cefepime has been discontinued by infectious disease.  Staph hominus bacteremia: The patient has received 10 days of vancomycin and 5 days of cefepime. Continue to follow surveillance cultures. No vegetations were appreciated on 05/25/2020 echocardiogram. ID has determined that staph hominus is a contamnant. Vancomycin has been discontinued.  Anemia: Monitor and transfuse for hemoglobin less than 7.0. Iron studies demonstrate severe iron deficiencies. Nathen May has been orderd.  Thrombocytopenia, resolved Continue to monitor.  Hypokalemia: Supplement and monitor.  Hypomagnesemia: Supplement and monitor  Hyponatremia: Resolved. Monitor.  Left femur fracture: S/P surgical repair 6/13 (cephalomedullary nailing): Post op care per ortho. Pt is WBAT on Lt.  Inadequate PO intake: Nutrition consult. Pt has been refusing help with meals.  I have seen and examined this patient myself. I have spent 32 minutes in his evaluation and care.  DVT Prophylaxis: Lovenox CODE STATUS: Full Code Family Communication: None available. Disposition: The patient is from home. Anticipate discharge to SNF vs CIR. Pending evaluation.  Barrier to discharge includes continued delirium/withdrawal, pending evaluation for CIR.  Status is: Inpatient  Remains  inpatient appropriate because:Altered mental status and Inpatient level of care appropriate due to severity of illness   Dispo: The patient is from: Home              Anticipated d/c is to: SNF              Anticipated d/c date is: 3 days              Patient currently  is not medically stable to d/c. Do to lack of a safe discharge.  Shadonna Benedick, DO [facility] Direct contact: see [email or web address]  7PM-7AM contact night coverage as above 06/06/2020, 2:58 PM  LOS: 9 days

## 2020-06-06 NOTE — Plan of Care (Signed)
  Problem: Education: Goal: Knowledge of General Education information will improve Description: Including pain rating scale, medication(s)/side effects and non-pharmacologic comfort measures Outcome: Progressing   Problem: Health Behavior/Discharge Planning: Goal: Ability to manage health-related needs will improve Outcome: Progressing   Problem: Clinical Measurements: Goal: Ability to maintain clinical measurements within normal limits will improve Outcome: Progressing Goal: Will remain free from infection Outcome: Progressing Goal: Diagnostic test results will improve Outcome: Progressing Goal: Respiratory complications will improve Outcome: Progressing Goal: Cardiovascular complication will be avoided Outcome: Progressing   Problem: Activity: Goal: Risk for activity intolerance will decrease Outcome: Progressing   Problem: Nutrition: Goal: Adequate nutrition will be maintained Outcome: Progressing   Problem: Coping: Goal: Level of anxiety will decrease Outcome: Progressing   Problem: Elimination: Goal: Will not experience complications related to bowel motility Outcome: Progressing Goal: Will not experience complications related to urinary retention Outcome: Progressing   Problem: Pain Managment: Goal: General experience of comfort will improve Outcome: Progressing   Problem: Safety: Goal: Ability to remain free from injury will improve Outcome: Progressing   Problem: Skin Integrity: Goal: Risk for impaired skin integrity will decrease Outcome: Progressing   Problem: Activity: Goal: Ability to tolerate increased activity will improve Outcome: Progressing   Problem: Respiratory: Goal: Ability to maintain a clear airway and adequate ventilation will improve Outcome: Progressing   Problem: Role Relationship: Goal: Method of communication will improve Outcome: Progressing   Problem: Education: Goal: Expressions of having a comfortable level of knowledge  regarding the disease process will increase Outcome: Progressing   Problem: Coping: Goal: Ability to adjust to condition or change in health will improve Outcome: Progressing Goal: Ability to identify appropriate support needs will improve Outcome: Progressing   Problem: Health Behavior/Discharge Planning: Goal: Compliance with prescribed medication regimen will improve Outcome: Progressing   Problem: Medication: Goal: Risk for medication side effects will decrease Outcome: Progressing   Problem: Clinical Measurements: Goal: Complications related to the disease process, condition or treatment will be avoided or minimized Outcome: Progressing Goal: Diagnostic test results will improve Outcome: Progressing   Problem: Safety: Goal: Verbalization of understanding the information provided will improve Outcome: Progressing   Problem: Self-Concept: Goal: Level of anxiety will decrease Outcome: Progressing Goal: Ability to verbalize feelings about condition will improve Outcome: Progressing   

## 2020-06-06 NOTE — Progress Notes (Signed)
  Speech Language Pathology Treatment: Cognitive-Linquistic  Patient Details Name: [name] MRN: [id number] DOB: 1981/11/24 Today's Date: 06/06/2020 Time: 2633-3545 SLP Time Calculation (min) (ACUTE ONLY): 23 min  Assessment / Plan / Recommendation Clinical Impression  Pt continues to demonstrate deficits in awareness, problem solving, memory and reasoning. Relaying stories of being poisoned, recalling people on top a building during times he was hallucinating but thinks they really did happen. He recalled his sister requesting a call when therapist arrives. Sister was updated on progress and status. Moderate cueing provided during reasoning tasks. He answered verbal problem solving related to familiar situations for money and temporal situations without cueing. Pt will need 24 hour assist at discharge.    HPI HPI: Pt is a 39 yo male presenting after drinking binge and then withdrawal seizures. He required intubation for airway protection 6/11; self-extuabted on 6/17. He was also found to have L hip fx s/p surgical repair 6/13. UDS positive for amphetamines. PMH includes stroke, ETOH abose, seizure disorder, BKA.       SLP Plan          Recommendations                   Oral Care Recommendations: Oral care BID Follow up Recommendations: 24 hour supervision/assistance;Inpatient Rehab SLP Visit Diagnosis: Cognitive communication deficit (R41.841)                       Houston Siren 06/06/2020, 3:49 PM  Orbie Pyo Colvin Caroli.Ed Risk analyst [phone] Office [phone]

## 2020-06-07 DIAGNOSIS — F1510 Other stimulant abuse, uncomplicated: Secondary | ICD-10-CM

## 2020-06-07 DIAGNOSIS — R509 Fever, unspecified: Secondary | ICD-10-CM

## 2020-06-07 DIAGNOSIS — K760 Fatty (change of) liver, not elsewhere classified: Secondary | ICD-10-CM

## 2020-06-07 DIAGNOSIS — Z419 Encounter for procedure for purposes other than remedying health state, unspecified: Secondary | ICD-10-CM

## 2020-06-07 DIAGNOSIS — Z720 Tobacco use: Secondary | ICD-10-CM

## 2020-06-07 DIAGNOSIS — D649 Anemia, unspecified: Secondary | ICD-10-CM

## 2020-06-07 DIAGNOSIS — Z89512 Acquired absence of left leg below knee: Secondary | ICD-10-CM

## 2020-06-07 DIAGNOSIS — I712 Thoracic aortic aneurysm, without rupture: Secondary | ICD-10-CM

## 2020-06-07 LAB — CBC WITH DIFFERENTIAL/PLATELET
Abs Immature Granulocytes: 0.08 10*3/uL — ABNORMAL HIGH (ref 0.00–0.07)
Basophils Absolute: 0.1 10*3/uL (ref 0.0–0.1)
Basophils Relative: 1 %
Eosinophils Absolute: 0.3 10*3/uL (ref 0.0–0.5)
Eosinophils Relative: 2 %
HCT: 28 % — ABNORMAL LOW (ref 39.0–52.0)
Hemoglobin: 8.8 g/dL — ABNORMAL LOW (ref 13.0–17.0)
Immature Granulocytes: 1 %
Lymphocytes Relative: 28 %
Lymphs Abs: 4 10*3/uL (ref 0.7–4.0)
MCH: 27.9 pg (ref 26.0–34.0)
MCHC: 31.4 g/dL (ref 30.0–36.0)
MCV: 88.9 fL (ref 80.0–100.0)
Monocytes Absolute: 1.3 10*3/uL — ABNORMAL HIGH (ref 0.1–1.0)
Monocytes Relative: 9 %
Neutro Abs: 8.3 10*3/uL — ABNORMAL HIGH (ref 1.7–7.7)
Neutrophils Relative %: 59 %
Platelets: 406 10*3/uL — ABNORMAL HIGH (ref 150–400)
RBC: 3.15 MIL/uL — ABNORMAL LOW (ref 4.22–5.81)
RDW: 20.4 % — ABNORMAL HIGH (ref 11.5–15.5)
WBC: 14 10*3/uL — ABNORMAL HIGH (ref 4.0–10.5)
nRBC: 0 % (ref 0.0–0.2)

## 2020-06-07 LAB — BASIC METABOLIC PANEL
Anion gap: 11 (ref 5–15)
BUN: 8 mg/dL (ref 6–20)
CO2: 23 mmol/L (ref 22–32)
Calcium: 8.2 mg/dL — ABNORMAL LOW (ref 8.9–10.3)
Chloride: 102 mmol/L (ref 98–111)
Creatinine, Ser: 0.96 mg/dL (ref 0.61–1.24)
GFR calc Af Amer: >60 mL/min (ref >60)
GFR calc non Af Amer: >60 mL/min (ref >60)
Glucose, Bld: 105 mg/dL — ABNORMAL HIGH (ref 70–99)
Potassium: 3 mmol/L — ABNORMAL LOW (ref 3.5–5.1)
Sodium: 136 mmol/L (ref 135–145)

## 2020-06-07 MED ORDER — VITAMIN D3 25 MCG PO TABS: 2000.0000 [IU] | ORAL_TABLET | Freq: Every day | ORAL | 0 refills | Status: DC

## 2020-06-07 MED ORDER — ADULT MULTIVITAMIN W/MINERALS CH: 1.0000 | ORAL_TABLET | Freq: Every day | ORAL | 0 refills | Status: DC

## 2020-06-07 MED ORDER — FUROSEMIDE 20 MG PO TABS
20.0000 mg | ORAL_TABLET | Freq: Every day | ORAL | 11 refills | Status: DC
Start: 2020-06-07 — End: 2021-07-08

## 2020-06-07 MED ORDER — NICOTINE 21 MG/24HR TD PT24: 21.0000 mg | MEDICATED_PATCH | Freq: Every day | TRANSDERMAL | 0 refills | Status: DC

## 2020-06-07 MED ORDER — VITAMIN D (ERGOCALCIFEROL) 1.25 MG (50000 UNIT) PO CAPS: 50000.0000 [IU] | ORAL_CAPSULE | ORAL | 0 refills | Status: DC

## 2020-06-07 MED ORDER — THIAMINE HCL 100 MG PO TABS: 100.0000 mg | ORAL_TABLET | Freq: Every day | ORAL | 0 refills | Status: DC

## 2020-06-07 MED ORDER — POTASSIUM CHLORIDE ER 10 MEQ PO TBCR: 10.0000 meq | EXTENDED_RELEASE_TABLET | Freq: Every day | ORAL | 0 refills | Status: DC

## 2020-06-07 MED ORDER — POTASSIUM CHLORIDE 10 MEQ/100ML IV SOLN
10.0000 meq | INTRAVENOUS | Status: AC
  Administered 2020-06-07 (×4): 10 meq via INTRAVENOUS
  Filled 2020-06-07 (×4): qty 100

## 2020-06-07 MED ORDER — FOLIC ACID 1 MG PO TABS: 1.0000 mg | ORAL_TABLET | Freq: Every day | ORAL | 0 refills | Status: DC

## 2020-06-07 MED ORDER — LEVETIRACETAM 750 MG PO TABS
750.0000 mg | ORAL_TABLET | Freq: Two times a day (BID) | ORAL | 0 refills | Status: DC
Start: 2020-06-07 — End: 2021-11-19

## 2020-06-07 NOTE — TOC Transition Note (Signed)
Transition of Care  [facility]) - CM/SW Discharge Note   Patient Details  Name: Kona Lover MRN: [id number] Date of Birth: [birth year]  [facility] CM/SW Contact:  Carles Collet, RN Phone Number: 06/07/2020, 10:46 AM   Clinical Narrative:    Damaris Schooner w patient at bedside. Unable to secure [facility] services for medicaid. Patient states he has transportation and will be able to go to outpatient PT, discussed locations, referral placed to [facility]. RW ordered to room. Spoke w bedside RN who will obtain crutched from ortho tech    Final next level of care: Home/Self Care Barriers to Discharge: No Barriers Identified   Patient Goals and CMS Choice Patient states their goals for this hospitalization and ongoing recovery are:: to go home      Discharge Placement                       Discharge Plan and Services                DME Arranged: Walker rolling, Crutches DME Agency: [employer] Date DME Agency Contacted: 06/07/20 Time DME Agency Contacted: [phone] Representative spoke with at DME Agency: [place] Interventions     Readmission Risk Interventions No flowsheet data found.

## 2020-06-07 NOTE — Discharge Summary (Addendum)
Physician Discharge Summary  [name] HAL:[id number] DOB: 03-05-1981 DOA: 05/23/2020  PCP: Care, J[facility] Bl[facility]  Admit date: 05/23/2020 Discharge date: 06/07/2020  Recommendations for Outpatient Follow-up:  Discharge to home under the 24 hr/7day supervision of girlfriend, [place]. Follow up with PCP in 7-10 days. Follow up with orthopedic surgery in 7-10 days.   Follow-up Information     [facility] Follow up.   Specialty: Rehabilitation Why: They will call you this week to schedule physical therapy Contact information: [address] [id number] [place] [phone]               Discharge Diagnoses: Principal diagnosis is #1 Status epilepticus Respiratory failure Chronic cognitive deficits Encephalopathy Sinusitis Anemia Left femur fracture Decreased oral intake ETOH withdrawal Sepsis due to bacteremia - ruled out.  Discharge Condition: Fair  Disposition: Home to 24 hour supervision by girlfriend  Diet recommendation: Heart healthy  Filed Weights   06/01/20 0339 06/02/20 0500 06/06/20 0500  Weight: 84.3 kg 83.5 kg 85.4 kg   History of present illness:  The patient is a 39 yr old man who was admitted from the ED on 05/23/2020 after several days of seizures and a fall at home. While in the ED the patient became confused and agitated requiring 6 mg of Ativan.  This was due to ETOH withdrawal and methamphetamine intoxication. Neurology was consulted and an EEG performed. 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     The patient was admitted to the ICU and intubated to protect his airway as he was in status epilepticus.  Hospital Course:  There were no seizures on EEG. He was started on Keppra 750 bid.  He was also found to have suffered a left femur fracture, and rhabdomyolysis. He was  admitted to [facility] and underwent surgery on 05/24/2020. The patient required intubation for airway protection on admission. He self-extubated on 05/30/2020. Sedation while on vent was difficult. Infectious disease has been consulted. The patient has received 10 days of vancomycin and 5 days of cefepime. Maxillofacial CT was performed and the patient was evaluated by ENT for possibly orbitasl fracture. The patient was also found to have left sided acute sinusitis. ENT recommended antibiotic treatment. The patient was transferred out of the ICU yesterday. He continues to suffer from delirium. SLP has been consulted to perform a cognitive eval. He has been cleared for a regular diet with thin liquids. He has been non-compliant with medications. He remains on a CIWA protocol.    2/2 Blood cultures obtained on 05/23/2020 have grown out staph heamolyticus. Repeat cultures drawn on 05/28/2020 grew out  Staph hominus. Repeat cultures drawn on 05/30/2020 have had no growth as of yet.  Infectious disease has been consulted. The patient has received 10 days of vancomycin and 5 days of cefepime. On 06/03/2020 ID has stated that they believe that the staph hominus is a contaminant. Vancomycin has been discontinued.   Since the patient has come out of the ICU he has remained delirious. ID and PT have both recommended SLP eval for cognition and language. The patient is WBAT on his left lower extremity. PT/OT has evaluated the patient. CIR has been consulted, but they have determined that the patient is not a candidate for inpatient rehab. TOC has been consulted to find SNF placement.  Today's assessment: S: The patient is resting comfortably. No new complaints. O: Vitals:  Vitals:   06/07/20 0751 06/07/20 1220  BP: 101/65 110/65  Pulse: 85 72  Resp: 18 (!) 21  Temp: 99.1 F (37.3 C) 99.6 F (37.6 C)  SpO2: 98% 98%   Exam:  Constitutional:  The patient is awake, alert, and oriented x 3. No acute  distress. Respiratory:  No increased work of breathing. No wheezes, rales, or rhonchi No tactile fremitus Cardiovascular:  Regular rate and rhythm No murmurs, ectopy, or gallups. No lateral PMI. No thrills. Abdomen:  Abdomen is soft, non-tender, non-distended No hernias, masses, or organomegaly Normoactive bowel sounds.  Musculoskeletal:  No cyanosis, clubbing, or edema Skin:  No rashes, lesions, ulcers palpation of skin: no induration or nodules Neurologic:  CN 2-12 intact Sensation all 4 extremities intact Psychiatric:  Mental status Mood, affect appropriate Orientation to person, place, time  judgment and insight appear intact   Discharge Instructions  Discharge Instructions     Activity as tolerated - No restrictions   Complete by: As directed    Ambulatory referral to Occupational Therapy   Complete by: As directed    Ambulatory referral to Physical Medicine Rehab   Complete by: As directed    F/u in 2 weeks for management of outpatient rehabilitation   Ambulatory referral to Physical Therapy   Complete by: As directed    Call MD for:  redness, tenderness, or signs of infection (pain, swelling, redness, odor or green/yellow discharge around incision site)   Complete by: As directed    Call MD for:  severe uncontrolled pain   Complete by: As directed    Call MD for:  temperature >100.4   Complete by: As directed    Diet - low sodium heart healthy   Complete by: As directed    Discharge instructions   Complete by: As directed    Discharge to home under the 24 hr/7day supervision of girlfriend, [place]. Follow up with PCP in 7-10 days. Follow up with orthopedic surgery in 7-10 days.   Increase activity slowly   Complete by: As directed    No wound care   Complete by: As directed       Allergies as of 06/07/2020   No Known Allergies      Medication List     TAKE these medications    folic acid 1 MG tablet Commonly known as: FOLVITE Take 1 tablet  (1 mg total) by mouth daily. Start taking on: June 08, 2020   furosemide 20 MG tablet Commonly known as: Lasix Take 1 tablet (20 mg total) by mouth daily.   ibuprofen 600 MG tablet Commonly known as: ADVIL Take 1 tablet (600 mg total) by mouth every 8 (eight) hours as needed for moderate pain.   levETIRAcetam 750 MG tablet Commonly known as: Keppra Take 1 tablet (750 mg total) by mouth 2 (two) times daily. What changed:  medication strength how much to take   multivitamin with minerals Tabs tablet Place 1 tablet into feeding tube daily. Start taking on: June 08, 2020   nicotine 21 mg/24hr patch Commonly known as: NICODERM CQ - dosed in mg/24 hours Place 1 patch (21 mg total) onto the skin daily. Start taking on: June 08, 2020   potassium chloride 10 MEQ tablet Commonly known as: KLOR-CON Take 1 tablet (10 mEq total) by mouth daily.   thiamine 100 MG tablet Place 1 tablet (100 mg total) into feeding tube daily. Start taking on: June 08, 2020   Vitamin D (Ergocalciferol) 1.25 MG (50000 UNIT)  Caps capsule Commonly known as: DRISDOL Take 1 capsule (50,000 Units total) by mouth every 7 (seven) days. Start taking on: June 10, 2020   Vitamin D3 25 MCG tablet Commonly known as: Vitamin D Take 2 tablets (2,000 Units total) by mouth daily. Start taking on: June 08, 2020               Durable Medical Equipment  (From admission, onward)           Start     Ordered   06/07/20 1048  For home use only DME Walker rolling  Once       Question Answer Comment  Walker: With [place] Wheels   Patient needs a walker to treat with the following condition Weakness      06/07/20 1048   06/07/20 0944  DME Walker  Once       Question Answer Comment  Walker: With 5 Inch Wheels   Patient needs a walker to treat with the following condition Femur fracture, left ([place])      06/07/20 0956           No Known Allergies  The results of significant diagnostics from this  hospitalization (including imaging, microbiology, ancillary and laboratory) are listed below for reference.    Significant Diagnostic Studies: DG Chest 1 View  Result Date: 05/23/2020 CLINICAL DATA:  Pain following fall EXAM: CHEST  1 VIEW COMPARISON:  August 18, 2018 FINDINGS: The lungs are clear. The heart size and pulmonary vascularity are normal. No adenopathy. No pneumothorax. No bone lesions. IMPRESSION: Lungs clear.  Cardiac silhouette normal. Electronically Signed   By: Lowella Grip III M.D.   On: 05/23/2020 16:40   CT Head Wo Contrast  Result Date: 05/23/2020 CLINICAL DATA:  Neck pain.  Disorientation and confusion. EXAM: CT HEAD WITHOUT CONTRAST CT CERVICAL SPINE WITHOUT CONTRAST TECHNIQUE: Multidetector CT imaging of the head and cervical spine was performed following the standard protocol without intravenous contrast. Multiplanar CT image reconstructions of the cervical spine were also generated. COMPARISON:  05/21/2018 FINDINGS: CT HEAD FINDINGS Brain: No evidence of acute infarction, hemorrhage, hydrocephalus, extra-axial collection or mass lesion/mass effect. Atrophy as before. Vascular: No hyperdense vessel or unexpected calcification. Skull: Normal. Negative for fracture or focal lesion. Sinuses/Orbits: Opacification of RIGHT maxillary and paranasal sinuses in this intubated patient. CT CERVICAL SPINE FINDINGS Alignment: Normal. Skull base and vertebrae: No acute fracture. No primary bone lesion or focal pathologic process. Soft tissues and spinal canal: No prevertebral fluid or swelling. No visible canal hematoma. Disc levels: Multilevel spinal degenerative change greatest at C4-5 and C5-6 with disc space narrowing and osteophyte formation at these levels, worse than on the previous imaging study Upper chest: Negative. Other: Opacification of sinuses in this intubated patient. IMPRESSION: 1. No acute intracranial pathology. 2. No acute fracture or subluxation of the cervical spine.  3. Multilevel spinal degenerative change greatest at C4-5 and C5-6 with disc space narrowing and osteophyte formation at these levels, worse than on the previous imaging study. Electronically Signed   By: Zetta Bills M.D.   On: 05/23/2020 20:26   CT Cervical Spine Wo Contrast  Result Date: 05/23/2020 CLINICAL DATA:  Neck pain.  Disorientation and confusion. EXAM: CT HEAD WITHOUT CONTRAST CT CERVICAL SPINE WITHOUT CONTRAST TECHNIQUE: Multidetector CT imaging of the head and cervical spine was performed following the standard protocol without intravenous contrast. Multiplanar CT image reconstructions of the cervical spine were also generated. COMPARISON:  05/21/2018 FINDINGS: CT HEAD FINDINGS Brain:  No evidence of acute infarction, hemorrhage, hydrocephalus, extra-axial collection or mass lesion/mass effect. Atrophy as before. Vascular: No hyperdense vessel or unexpected calcification. Skull: Normal. Negative for fracture or focal lesion. Sinuses/Orbits: Opacification of RIGHT maxillary and paranasal sinuses in this intubated patient. CT CERVICAL SPINE FINDINGS Alignment: Normal. Skull base and vertebrae: No acute fracture. No primary bone lesion or focal pathologic process. Soft tissues and spinal canal: No prevertebral fluid or swelling. No visible canal hematoma. Disc levels: Multilevel spinal degenerative change greatest at C4-5 and C5-6 with disc space narrowing and osteophyte formation at these levels, worse than on the previous imaging study Upper chest: Negative. Other: Opacification of sinuses in this intubated patient. IMPRESSION: 1. No acute intracranial pathology. 2. No acute fracture or subluxation of the cervical spine. 3. Multilevel spinal degenerative change greatest at C4-5 and C5-6 with disc space narrowing and osteophyte formation at these levels, worse than on the previous imaging study. Electronically Signed   By: Zetta Bills M.D.   On: 05/23/2020 20:26   DG CHEST PORT 1 VIEW  Result  Date: 05/29/2020 CLINICAL DATA:  Acute hypoxemia respiratory failure. EXAM: PORTABLE CHEST 1 VIEW COMPARISON:  May 28, 2020. FINDINGS: Stable cardiomediastinal silhouette. Endotracheal and nasogastric tubes are unchanged in position. Right-sided PICC line is noted with distal tip in expected position of right atrium. No pneumothorax or pleural effusion is noted. Lungs are clear. Bony thorax is unremarkable. IMPRESSION: Stable support apparatus. No acute cardiopulmonary abnormality seen. Right-sided PICC line is noted with distal tip in expected position of right atrium; withdrawal by 3-4 cm is recommended. Electronically Signed   By: Marijo Conception M.D.   On: 05/29/2020 08:40   DG CHEST PORT 1 VIEW  Result Date: 05/28/2020 CLINICAL DATA:  Respiratory failure. EXAM: PORTABLE CHEST 1 VIEW COMPARISON:  05/26/2020 FINDINGS: ET tube tip is above the carina. Nasogastric tube tip projects over the fundus of the stomach. The side port is at the level of the GE junction. Stable cardiac enlargement and pulmonary venous congestion. No pleural effusion or airspace consolidation. IMPRESSION: 1. Stable cardiac enlargement and pulmonary venous congestion. Electronically Signed   By: Kerby Moors M.D.   On: 05/28/2020 08:29   DG CHEST PORT 1 VIEW  Result Date: 05/26/2020 CLINICAL DATA:  Follow-up endotracheal intubation. EXAM: PORTABLE CHEST 1 VIEW COMPARISON:  05/25/2020 FINDINGS: Endotracheal tube is high, at the thoracic inlet region, 10 cm above the carina. Orogastric or nasogastric tube enters the stomach. Cardiomegaly as seen previously. The patient is rotated towards the right. There is mild atelectasis in the right lower lung. IMPRESSION: Endotracheal tube high, 10 cm above the carina. Some atelectasis at the right lower lung. Electronically Signed   By: Nelson Chimes M.D.   On: 05/26/2020 13:20   DG Chest Port 1 View  Result Date: 05/25/2020 CLINICAL DATA:  Hypoxia EXAM: PORTABLE CHEST 1 VIEW COMPARISON:   May 24, 2020 FINDINGS: Endotracheal tube tip is 6.3 cm above the carina. Nasogastric tube tip and side port are in the proximal stomach. No pneumothorax. There is slight right base atelectasis. Lungs otherwise are clear. Heart is upper normal in size with pulmonary vascularity normal. No adenopathy. No bone lesions. IMPRESSION: Tube positions as described without pneumothorax. Slight right base atelectasis. Lungs otherwise clear. Stable cardiac silhouette. Electronically Signed   By: Lowella Grip III M.D.   On: 05/25/2020 08:20   DG Chest Port 1 View  Result Date: 05/24/2020 CLINICAL DATA:  Hypoxia EXAM: PORTABLE CHEST 1 VIEW COMPARISON:  May 23, 2020 FINDINGS: Endotracheal tube tip is 6.2 cm above the carina. Nasogastric tube tip and side port are in the stomach. No pneumothorax. The lungs are clear. Heart is upper normal in size with pulmonary vascularity normal. No adenopathy. No bone lesions. IMPRESSION: Tube positions as described without pneumothorax. Lungs clear. Stable cardiac silhouette. Electronically Signed   By: Lowella Grip III M.D.   On: 05/24/2020 09:46   DG Abd Portable 1V  Result Date: 05/24/2020 CLINICAL DATA:  Orogastric tube placement EXAM: PORTABLE ABDOMEN - 1 VIEW COMPARISON:  None. FINDINGS: Orogastric tube tip and side port are in the stomach with the side port just distal to the gastroesophageal junction. Visualized bowel gas is unremarkable without bowel dilatation or air-fluid level to suggest bowel obstruction. No free air. Lung bases clear. IMPRESSION: Orogastric tube tip and side port in stomach with side port immediately distal to the gastroesophageal junction. Visualized bowel gas pattern unremarkable. No evident bowel obstruction or free air. Lung bases clear. Electronically Signed   By: Lowella Grip III M.D.   On: 05/24/2020 09:45   EEG adult  Result Date: 05/23/2020 Lora Havens, MD     05/23/2020 10:12 PM Patient Name: Arlington Sigmund MRN:  [id number] Epilepsy Attending: Lora Havens Referring Physician/Provider: Dr Roland Rack Date: 05/23/2020 Duration: 24.20 mins Patient history: 39 year old male with focal seizures with recurrent seizures without return to baseline, consistent with status epilepticus.  EEG to evaluate for seizure Level of alertness: Lethargic AEDs during EEG study: Versed, keppra, Propofol Technical aspects: This EEG study was done with scalp electrodes positioned according to the 10-20 International system of electrode placement. Electrical activity was acquired at a sampling rate of 500Hz  and reviewed with a high frequency filter of 70Hz  and a low frequency filter of 1Hz . EEG data were recorded continuously and digitally stored. Description: EEG showed continuous generalized 3 to 6 Hz theta-delta slowing admixed with 15 to 18 Hz beta activity with irregular morphology distributed symmetrically and diffusely.  Hyperventilation and photic stimulation were not performed.   ABNORMALITY -Excessive beta, generalized -Continuous slow, generalized IMPRESSION: This study is suggestive of severe diffuse encephalopathy, nonspecific etiology but likely related to sedation. The excessive beta activity seen in the background is most likely due to the effect of benzodiazepine and is a benign EEG pattern. No seizures or epileptiform discharges were seen throughout the recording. Priyanka Barbra Sarks   Overnight EEG with video  Result Date: 05/24/2020 Lora Havens, MD     05/25/2020  9:11 AM Patient Name: Fernand Sorbello MRN: [id number] Epilepsy Attending: Lora Havens Referring Physician/Provider: Dr Roland Rack Duration: 05/23/2020 2210 to 05/24/2020 2210   Patient history: 39 year old male with focal seizures with recurrent seizures without return to baseline, consistent with status epilepticus.  EEG to evaluate for seizure   Level of alertness: Lethargic   AEDs during EEG study: Versed, keppra, Propofol   Technical  aspects: This EEG study was done with scalp electrodes positioned according to the 10-20 International system of electrode placement. Electrical activity was acquired at a sampling rate of 500Hz  and reviewed with a high frequency filter of 70Hz  and a low frequency filter of 1Hz . EEG data were recorded continuously and digitally stored.   Description: EEG showed continuous generalized 3 to 6 Hz theta-delta slowing admixed with 15 to 18 Hz beta activity with irregular morphology distributed symmetrically and diffusely.  Hyperventilation and photic stimulation were not performed.     ABNORMALITY -Excessive beta, generalized -Continuous slow, generalized  IMPRESSION: This study is suggestive of severe diffuse encephalopathy, nonspecific etiology but likely related to sedation. The excessive beta activity seen in the background is most likely due to the effect of benzodiazepine and is a benign EEG pattern. No seizures or epileptiform discharges were seen throughout the recording.   Lora Havens   DG C-Arm 1-60 Min  Result Date: 05/25/2020 CLINICAL DATA:  Left proximal femoral fracture EXAM: OPERATIVE LEFT HIP WITH PELVIS; DG C-ARM 1-60 MIN COMPARISON:  05/23/2020 FLUOROSCOPY TIME:  Radiation Exposure Index (as provided by the fluoroscopic device): Not available If the device does not provide the exposure index: Fluoroscopy Time:  2 minutes 8 seconds Number of Acquired Images:  6 FINDINGS: Initial images again demonstrate the intratrochanteric fracture although the fracture fragments have been significantly reduced. Proximal medullary nail is noted with fixation screws traversing the femoral neck as well as in the proximal femoral shaft. Fracture fragments are in near anatomic alignment. IMPRESSION: ORIF of proximal left femoral fracture. Electronically Signed   By: Inez Catalina M.D.   On: 05/25/2020 15:25   ECHOCARDIOGRAM COMPLETE  Result Date: 05/25/2020    ECHOCARDIOGRAM REPORT   Patient Name:   MYRTLE BARNHARD Date of Exam: 05/25/2020 Medical Rec #:  [id number]       Height:       68.0 in Accession #:    [id number]      Weight:       189.6 lb Date of Birth:  [birth year]      BSA:          1.998 m Patient Age:    57 years        BP:           125/68 mmHg Patient Gender: M               HR:           65 bpm. Exam Location:  Inpatient Procedure: 2D Echo, Color Doppler and Cardiac Doppler Indications:    Bacteremia  History:        Patient has prior history of Echocardiogram examinations, most                 recent 04/27/2012. Risk Factors:ETOH Abuse.  Sonographer:    Raquel Sarna Senior RDCS Referring Phys: Toney Sang SOOD  Sonographer Comments: No parasternal window and echo performed with patient supine and on artificial respirator. IMPRESSIONS  1. Poor quality images if suspicion for SBE high can consider TEE.  2. Poor echo images Appears to have abnormal septal motion and mild diffuse hypokinesis . Left ventricular ejection fraction, by estimation, is 45 to 50%. The left ventricle has mildly decreased function. The left ventricle demonstrates global hypokinesis. Left ventricular diastolic function could not be evaluated.  3. Right ventricular systolic function is normal. The right ventricular size is normal. There is normal pulmonary artery systolic pressure.  4. The mitral valve is normal in structure. No evidence of mitral valve regurgitation. No evidence of mitral stenosis.  5. The aortic valve was not well visualized. Aortic valve regurgitation is not visualized. Mild aortic valve sclerosis is present, with no evidence of aortic valve stenosis.  6. Pulmonic valve regurgitation not seen well.  7. The inferior vena cava is normal in size with greater than 50% respiratory variability, suggesting right atrial pressure of 3 mmHg. FINDINGS  Left Ventricle: Poor echo images Appears to have abnormal septal motion and mild diffuse hypokinesis. Left ventricular ejection fraction, by estimation, is 45 to  50%. The left ventricle  has mildly decreased function. The left ventricle demonstrates global hypokinesis. The left ventricular internal cavity size was normal in size. There is no left ventricular hypertrophy. Left ventricular diastolic function could not be evaluated. Right Ventricle: The right ventricular size is normal. No increase in right ventricular wall thickness. Right ventricular systolic function is normal. There is normal pulmonary artery systolic pressure. The tricuspid regurgitant velocity is 1.95 m/s, and  with an assumed right atrial pressure of 3 mmHg, the estimated right ventricular systolic pressure is 35.3 mmHg. Left Atrium: Left atrial size was normal in size. Right Atrium: Right atrial size was normal in size. Pericardium: There is no evidence of pericardial effusion. Mitral Valve: The mitral valve is normal in structure. There is mild thickening of the mitral valve leaflet(s). There is mild calcification of the mitral valve leaflet(s). Normal mobility of the mitral valve leaflets. No evidence of mitral valve regurgitation. No evidence of mitral valve stenosis. Tricuspid Valve: The tricuspid valve is not well visualized. Tricuspid valve regurgitation is trivial. No evidence of tricuspid stenosis. Aortic Valve: The aortic valve was not well visualized. Aortic valve regurgitation is not visualized. Mild aortic valve sclerosis is present, with no evidence of aortic valve stenosis. Pulmonic Valve: The pulmonic valve was not well visualized. Pulmonic valve regurgitation not seen well. No evidence of pulmonic stenosis. Aorta: The aortic root is normal in size and structure. Venous: The inferior vena cava is normal in size with greater than 50% respiratory variability, suggesting right atrial pressure of 3 mmHg. IAS/Shunts: No atrial level shunt detected by color flow Doppler. Additional Comments: Poor quality images if suspicion for SBE high can consider TEE.  LEFT VENTRICLE PLAX 2D LVIDd:         4.10 cm      Diastology  LVIDs:         3.20 cm      LV e' lateral:   8.16 cm/s LV PW:         0.90 cm      LV E/e' lateral: 6.5 LV IVS:        1.10 cm      LV e' medial:    6.42 cm/s LVOT diam:     2.00 cm      LV E/e' medial:  8.2 LV SV:         51 LV SV Index:   25 LVOT Area:     3.14 cm  LV Volumes (MOD) LV vol d, MOD A2C: 123.0 ml LV vol d, MOD A4C: 168.0 ml LV vol s, MOD A2C: 60.7 ml LV vol s, MOD A4C: 82.2 ml LV SV MOD A2C:     62.3 ml LV SV MOD A4C:     168.0 ml LV SV MOD BP:      72.2 ml RIGHT VENTRICLE RV S prime:     8.81 cm/s TAPSE (M-mode): 1.9 cm LEFT ATRIUM             Index       RIGHT ATRIUM           Index LA diam:        4.40 cm 2.20 cm/m  RA Area:     14.50 cm LA Vol (A2C):   75.5 ml 37.79 ml/m RA Volume:   28.90 ml  14.47 ml/m LA Vol (A4C):   53.2 ml 26.63 ml/m LA Biplane Vol: 66.4 ml 33.24 ml/m  AORTIC VALVE LVOT Vmax:   82.90 cm/s LVOT  Vmean:  60.500 cm/s LVOT VTI:    0.162 m  AORTA Ao Root diam: 3.30 cm MITRAL VALVE               TRICUSPID VALVE MV Area (PHT): 1.91 cm    TR Peak grad:   15.2 mmHg MV Decel Time: 398 msec    TR Vmax:        195.00 cm/s MV E velocity: 52.70 cm/s MV A velocity: 44.60 cm/s  SHUNTS MV E/A ratio:  1.18        Systemic VTI:  0.16 m                            Systemic Diam: 2.00 cm Jenkins Rouge MD Electronically signed by Jenkins Rouge MD Signature Date/Time: 05/25/2020/11:19:05 AM    Final    DG HIP PORT UNILAT W OR W/O PELVIS 1V LEFT  Result Date: 05/25/2020 CLINICAL DATA:  Fracture, post ORIF EXAM: DG HIP (WITH OR WITHOUT PELVIS) 1V PORT LEFT COMPARISON:  05/23/2020 FINDINGS: Status post intramedullary nail fixation of intratrochanteric fractures of the left hip, with persistent displacement of fracture fragments. Expected overlying postoperative changes. IMPRESSION: Status post intramedullary nail fixation of intratrochanteric fractures of the left hip, with persistent displacement of fracture fragments. Expected overlying postoperative changes. Electronically Signed   By: Eddie Candle M.D.   On: 05/25/2020 17:02   DG HIP OPERATIVE UNILAT WITH PELVIS LEFT  Result Date: 05/25/2020 CLINICAL DATA:  Left proximal femoral fracture EXAM: OPERATIVE LEFT HIP WITH PELVIS; DG C-ARM 1-60 MIN COMPARISON:  05/23/2020 FLUOROSCOPY TIME:  Radiation Exposure Index (as provided by the fluoroscopic device): Not available If the device does not provide the exposure index: Fluoroscopy Time:  2 minutes 8 seconds Number of Acquired Images:  6 FINDINGS: Initial images again demonstrate the intratrochanteric fracture although the fracture fragments have been significantly reduced. Proximal medullary nail is noted with fixation screws traversing the femoral neck as well as in the proximal femoral shaft. Fracture fragments are in near anatomic alignment. IMPRESSION: ORIF of proximal left femoral fracture. Electronically Signed   By: Inez Catalina M.D.   On: 05/25/2020 15:25   DG HIP UNILAT WITH PELVIS 2-3 VIEWS RIGHT  Result Date: 05/23/2020 CLINICAL DATA:  Pain following fall EXAM: DG HIP (WITH OR WITHOUT PELVIS) 2-3V RIGHT COMPARISON:  None. FINDINGS: Frontal pelvis as well as frontal and lateral right hip images were obtained. There is a comminuted intertrochanteric femur fracture on the left with varus angulation at the fracture site and avulsion of the lesser trochanter. On the right, there is no appreciable fracture or dislocation. There is mild narrowing of each hip joint. No erosive change. Calcification noted adjacent to the greater trochanter on the right likely represents calcific tendinosis. IMPRESSION: Comminuted intertrochanteric femur fracture on the left. On the right, no fracture or dislocation evident. Mild symmetric narrowing of each hip joint. Electronically Signed   By: Lowella Grip III M.D.   On: 05/23/2020 16:42   DG Femur Min 2 Views Left  Result Date: 05/23/2020 CLINICAL DATA:  Pain following fall EXAM: LEFT FEMUR 2 VIEWS COMPARISON:  March 27, 2013 FINDINGS: Frontal and  lateral views were obtained. There is a comminuted intertrochanteric femur fracture on the left with varus angulation at the fracture site. There is avulsion of the lesser trochanter. No other fractures are evident. The patient is status post below the knee amputation. There is a degree of underlying osteoporosis.  IMPRESSION: Comminuted fracture, intertrochanteric region of proximal femur with varus angulation at fracture site. No more distal fracture evident. No dislocation. Status post below the knee amputation. Osteoporosis. Electronically Signed   By: Lowella Grip III M.D.   On: 05/23/2020 16:40   [place] EKG SITE RITE  Result Date: 05/28/2020 If Site Rite image not attached, placement could not be confirmed due to current cardiac rhythm.  [place] EKG SITE RITE  Result Date: 05/28/2020 If Site Rite image not attached, placement could not be confirmed due to current cardiac rhythm.  CT MAXILLOFACIAL WO CONTRAST  Result Date: 05/28/2020 CLINICAL DATA:  Chronic sinusitis. EXAM: CT MAXILLOFACIAL WITHOUT CONTRAST TECHNIQUE: Multidetector CT imaging of the maxillofacial structures was performed. Multiplanar CT image reconstructions were also generated. COMPARISON:  May 23, 2020. FINDINGS: Osseous: No fracture or mandibular dislocation. No destructive process. Orbits: Negative. No traumatic or inflammatory finding. Sinuses: Severe mucosal thickening is noted in right maxillary sinus consistent with probable chronic sinusitis. Air-fluid level is noted in left sphenoid sinus consistent with acute sinusitis. Soft tissues: Negative. Limited intracranial: No significant or unexpected finding. IMPRESSION: Severe mucosal thickening is noted in right maxillary sinus consistent with probable chronic sinusitis. Air-fluid level is noted in left sphenoid sinus consistent with acute sinusitis. Electronically Signed   By: Marijo Conception M.D.   On: 05/28/2020 16:54   US Abdomen Limited RUQ  Result Date:  05/27/2020 CLINICAL DATA:  Cirrhosis EXAM: ULTRASOUND ABDOMEN LIMITED RIGHT UPPER QUADRANT COMPARISON:  June 05, 2016 FINDINGS: Gallbladder: Small amount of possible layering sludge is seen. No gallbladder wall thickening. No sonographic Murphy sign noted by sonographer. Common bile duct: Diameter: 5 mm Liver: Increased echotexture seen throughout. No focal abnormality or biliary ductal dilatation. Portal vein is patent on color Doppler imaging with normal direction of blood flow towards the liver. Other: None. IMPRESSION: Gallbladder sludge, no evidence of acute cholecystitis. Hepatic steatosis. Electronically Signed   By: Prudencio Pair M.D.   On: 05/27/2020 22:07    Microbiology: Recent Results (from the past 240 hour(s))  Culture, blood (routine x 2)     Status: None   Collection Time: 05/30/20 12:52 PM   Specimen: BLOOD LEFT ARM  Result Value Ref Range Status   Specimen Description BLOOD LEFT ARM  Final   Special Requests   Final    BOTTLES DRAWN AEROBIC AND ANAEROBIC Blood Culture adequate volume   Culture   Final    NO GROWTH 5 DAYS Performed at [facility], [address], [place], [place] [address]    Report Status 06/04/2020 FINAL  Final  Culture, blood (routine x 2)     Status: None   Collection Time: 05/30/20 12:55 PM   Specimen: BLOOD LEFT ARM  Result Value Ref Range Status   Specimen Description BLOOD LEFT ARM  Final   Special Requests   Final    BOTTLES DRAWN AEROBIC AND ANAEROBIC Blood Culture adequate volume   Culture   Final    NO GROWTH 5 DAYS Performed at [facility], [place] [address] [place], [place] [address]    Report Status 06/04/2020 FINAL  Final     Labs: Basic Metabolic Panel: Recent Labs  Lab 06/01/20 0539 06/02/20 0347 06/05/20 0323 06/07/20 0650  NA 138 140 135 136  K 3.6 3.1* 3.2* 3.0*  CL 105 109 106 102  CO2 24 22 22 23   GLUCOSE 101* 112* 100* 105*  BUN 18 15 13 8   CREATININE 1.06 1.05 1.06  0.96  CALCIUM 8.3* 8.2* 8.1* 8.2*   MG 1.7  --   --   --   PHOS 2.6  --   --   --    Liver Function Tests: No results for input(s): AST, ALT, ALKPHOS, BILITOT, PROT, ALBUMIN in the last 168 hours. No results for input(s): LIPASE, AMYLASE in the last 168 hours. No results for input(s): AMMONIA in the last 168 hours. CBC: Recent Labs  Lab 06/02/20 0347 06/04/20 0234 06/05/20 0323 06/07/20 0650  WBC 18.5* 17.7* 15.5* 14.0*  NEUTROABS 11.9* 11.9*  --  8.3*  HGB 7.7* 8.2* 8.1* 8.8*  HCT 25.8* 26.4* 26.4* 28.0*  MCV 89.0 87.7 88.0 88.9  PLT 236 237 269 406*   Cardiac Enzymes: No results for input(s): CKTOTAL, CKMB, CKMBINDEX, TROPONINI in the last 168 hours. BNP: BNP (last 3 results) Recent Labs    05/28/20 1337 05/29/20 0526  BNP 208.6* 202.3*    ProBNP (last 3 results) No results for input(s): PROBNP in the last 8760 hours.  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  Signed:        Mallisa Alameda, DO [facility]  06/07/2020, 2:36 PM

## 2020-06-07 NOTE — Progress Notes (Signed)
Orthopedic Tech Progress Note Patient Details:  [name] May 02, 1981 [id number]  Ortho Devices Type of Ortho Device: Crutches Ortho Device/Splint Interventions: Ordered    Nurse request crutches for patient. I delievered theme however the patient would not let me adjust crutches for him. He says he will do it himself at home.   Tammy Sours 06/07/2020, 11:23 AM

## 2020-12-11 ENCOUNTER — Emergency Department (HOSPITAL_COMMUNITY): Payer: Medicaid Other

## 2020-12-11 ENCOUNTER — Encounter (HOSPITAL_COMMUNITY): Payer: Self-pay

## 2020-12-11 ENCOUNTER — Other Ambulatory Visit: Payer: Self-pay

## 2020-12-11 ENCOUNTER — Emergency Department (HOSPITAL_COMMUNITY)
Admission: EM | Admit: 2020-12-11 | Discharge: 2020-12-11 | Disposition: A | Discharge status: Home / Self Care | Payer: Medicaid Other | Attending: Emergency Medicine | Admitting: Emergency Medicine

## 2020-12-11 DIAGNOSIS — W010XXA Fall on same level from slipping, tripping and stumbling without subsequent striking against object, initial encounter: Secondary | ICD-10-CM | POA: Insufficient documentation

## 2020-12-11 DIAGNOSIS — J45909 Unspecified asthma, uncomplicated: Secondary | ICD-10-CM | POA: Diagnosis not present

## 2020-12-11 DIAGNOSIS — M25532 Pain in left wrist: Secondary | ICD-10-CM | POA: Insufficient documentation

## 2020-12-11 DIAGNOSIS — F17210 Nicotine dependence, cigarettes, uncomplicated: Secondary | ICD-10-CM | POA: Insufficient documentation

## 2020-12-11 DIAGNOSIS — W19XXXA Unspecified fall, initial encounter: Secondary | ICD-10-CM

## 2020-12-11 DIAGNOSIS — M25562 Pain in left knee: Secondary | ICD-10-CM | POA: Diagnosis not present

## 2020-12-11 MED ORDER — IBUPROFEN 600 MG PO TABS: 600.0000 mg | ORAL_TABLET | Freq: Four times a day (QID) | ORAL | 0 refills | Status: DC | PRN

## 2020-12-11 MED ORDER — IBUPROFEN 200 MG PO TABS
600.0000 mg | ORAL_TABLET | Freq: Once | ORAL | Status: AC
Start: 2020-12-11 — End: 2020-12-11
  Administered 2020-12-11: 05:00:00 600 mg via ORAL
  Filled 2020-12-11: qty 3

## 2020-12-11 MED ORDER — METHOCARBAMOL 500 MG PO TABS
750.0000 mg | ORAL_TABLET | Freq: Once | ORAL | Status: AC
  Administered 2020-12-11: 750 mg via ORAL
  Filled 2020-12-11: qty 2

## 2020-12-11 MED ORDER — METHOCARBAMOL 750 MG PO TABS: 750.0000 mg | ORAL_TABLET | Freq: Two times a day (BID) | ORAL | 0 refills | Status: DC

## 2020-12-11 NOTE — ED Triage Notes (Signed)
Pt arrives EMS after a fall outside of home. Pt landed on left leg. Left leg BKA.

## 2020-12-11 NOTE — ED Notes (Signed)
Pt grabbed the railing on the wall to stop the wheelchair, jerked the wheelchair to a stop.

## 2020-12-11 NOTE — ED Notes (Signed)
Pt sitting on toilet and pulled call bell, pt had pulled wheel chair over on its side. Pt stated he was trying to get into it and it turned over.

## 2020-12-11 NOTE — ED Provider Notes (Signed)
[facility] DEPT Provider Note   CSN: [id number] Arrival date & time: 12/11/20  0300     History Chief Complaint  Patient presents with  . Fall    Kenley Stoneburner is a 39 y.o. male.  Patient to ED after tonight after slipping on leaves off of a curb. He landed on his left leg and caught his fall using his left hand. He did not hit his head. He denies neck/back, abdominal or chest pain. He has a left BKA and was walking with his prosthesis. He reports pain with swelling to left wrist and pain in the left knee and stump, with active muscle spasms of same. No wound or bleeding.   The history is provided by the patient. No language interpreter was used.  Fall Pertinent negatives include no chest pain, no abdominal pain and no headaches.       Past Medical History:  Diagnosis Date  . Alcohol abuse   . Seizures ([place])   . Stroke [facility])     Patient Active Problem List   Diagnosis Date Noted  . 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 Fever 05/30/2020  . Positive blood culture 05/30/2020  . Sinusitis 05/30/2020  . Delirium 05/30/2020  . Normocytic anemia 05/30/2020  . Encephalopathy acute   . Acute hypoxemic respiratory failure ([place])   . Elective surgery   . S/p left hip fracture   . Respiratory failure ([place])   . Seizure ([place]) 05/23/2020  . Sepsis ([place]) 06/05/2016  . Thrombocytopenia ([place]) 06/05/2016  . Alcohol abuse with intoxication ([place]) 06/05/2016  . Fatty liver 06/05/2016  . Asthma 06/05/2016  . Fall at home 05/03/2012  . postural orthostatic tachycardia 05/03/2012  . Hypokalemia 05/03/2012  . Steatosis of liver 05/03/2012  . Gallstones 05/03/2012  . Thoracic ascending aortic aneurysm ([place]) 05/03/2012  . 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 Tobacco abuse 05/03/2012  . Syncope 04/27/2012  . Alcohol abuse 04/27/2012  . Hx of BKA, left ([place]) 04/27/2012    Past Surgical History:  Procedure Laterality Date  . Above knee  amputation of left leg    . BELOW KNEE LEG AMPUTATION     LEFT  . below the knee amputation    . INTRAMEDULLARY (IM) NAIL INTERTROCHANTERIC Left 05/25/2020   Procedure: INTRAMEDULLARY (IM) NAIL INTERTROCHANTRIC;  Surgeon: Shona Needles, MD;  Location: [facility];  Service: Orthopedics;  Laterality: Left;       Family History  Problem Relation Age of Onset  . Heart attack Mother        Negative Hx    Social History   Tobacco Use  . Smoking status: Current Every Day Smoker    Packs/day: 0.33    Years: 20.00    Pack years: 6.60    Types: Cigarettes  . Smokeless tobacco: Never Used  Substance Use Topics  . Alcohol use: Yes    Alcohol/week: 2.0 standard drinks    Types: 2 Cans of beer per week    Comment: not every day  . Drug use: No    Home Medications Prior to Admission medications   Medication Sig Start Date End Date Taking? Authorizing Provider  cholecalciferol (VITAMIN D) 25 MCG tablet Take 2 tablets (2,000 Units total) by mouth daily. 06/08/20   Swayze, Ava, DO  folic acid (FOLVITE) 1 MG tablet Take 1 tablet (1 mg total) by mouth daily. 06/08/20   Swayze, Ava, DO  furosemide (LASIX) 20 MG tablet Take 1 tablet (20 mg total) by  mouth daily. 06/07/20 06/07/21  Swayze, Ava, DO  ibuprofen (ADVIL,MOTRIN) 600 MG tablet Take 1 tablet (600 mg total) by mouth every 8 (eight) hours as needed for moderate pain. Patient not taking: Reported on 08/18/2018 05/21/18   Ripley Fraise, MD  levETIRAcetam (KEPPRA) 750 MG tablet Take 1 tablet (750 mg total) by mouth 2 (two) times daily. 06/07/20   Swayze, Ava, DO  Multiple Vitamin (MULTIVITAMIN WITH MINERALS) TABS tablet Place 1 tablet into feeding tube daily. 06/08/20   Swayze, Ava, DO  nicotine (NICODERM CQ - DOSED IN MG/24 HOURS) 21 mg/24hr patch Place 1 patch (21 mg total) onto the skin daily. 06/08/20   Swayze, Ava, DO  potassium chloride (KLOR-CON) 10 MEQ tablet Take 1 tablet (10 mEq total) by mouth daily. 06/07/20   Swayze, Ava, DO  thiamine 100  MG tablet Place 1 tablet (100 mg total) into feeding tube daily. 06/08/20   Swayze, Ava, DO  Vitamin D, Ergocalciferol, (DRISDOL) 1.25 MG (50000 UNIT) CAPS capsule Take 1 capsule (50,000 Units total) by mouth every 7 (seven) days. 06/10/20   Swayze, Ava, DO    Allergies    Patient has no known allergies.  Review of Systems   Review of Systems  Constitutional: Negative for diaphoresis.  HENT: Negative.  Negative for facial swelling.   Cardiovascular: Negative.  Negative for chest pain.  Gastrointestinal: Negative.  Negative for abdominal pain and nausea.  Musculoskeletal:       See HPI  Skin: Negative.  Negative for wound.  Neurological: Negative.  Negative for syncope and headaches.    Physical Exam Updated Vital Signs BP 101/66 (BP Location: Left Arm)   Pulse 90   Temp 98.8 F (37.1 C)   Resp 20   Ht 5\' 8"  (1.727 m)   Wt 99.8 kg   SpO2 100%   BMI 33.45 kg/m   Physical Exam Constitutional:      Appearance: He is well-developed and well-nourished.  HENT:     Head: Atraumatic.  Pulmonary:     Effort: Pulmonary effort is normal.  Chest:     Chest wall: No tenderness.  Abdominal:     Palpations: Abdomen is soft.     Tenderness: There is no abdominal tenderness.  Musculoskeletal:        General: Normal range of motion.     Cervical back: Normal range of motion and neck supple.     Comments: Left BKA. No significant swelling or redness. No bony deformity. Tender most to circumferential knee and stump. Tenderness to left dorsal wrist with no deformity. Mild radial swelling.   No midline cervical or other spinal tenderness.   Skin:    General: Skin is warm and dry.  Neurological:     Mental Status: He is alert and oriented to person, place, and time.  Psychiatric:        Mood and Affect: Mood and affect normal.     ED Results / Procedures / Treatments   Labs (all labs ordered are listed, but only abnormal results are displayed) Labs Reviewed - No data to  display  EKG None  Radiology No results found.  Procedures Procedures (including critical care time)  Medications Ordered in ED Medications  methocarbamol (ROBAXIN) tablet 750 mg (has no administration in time range)  ibuprofen (ADVIL) tablet 600 mg (has no administration in time range)    ED Course  I have reviewed the triage vital signs and the nursing notes.  Pertinent labs & imaging results that were  available during my care of the patient were reviewed by me and considered in my medical decision making (see chart for details).    MDM Rules/Calculators/A&P                          Patient to ED after mechanical fall causing injury to BKA stump and wrist as per HPI.   Imaging done and is negative for fracture. He was given Robaxin and ibuprofen in the ED and is found sleeping comfortably on re-examination. Discussed ongoing use over the next 2-3 days for soreness and muscular spasm.   Patient is felt appropriate for discharge home.  Final Clinical Impression(s) / ED Diagnoses Final diagnoses:  None   1. Mechanical fall 2. Left wrist pain 3. Left knee pain  Rx / DC Orders ED Discharge Orders    None       Elpidio Anis, PA-C 12/11/20 1518    Nira Conn, MD 12/11/20 [phone]

## 2021-01-14 ENCOUNTER — Ambulatory Visit (HOSPITAL_COMMUNITY)
Admission: EM | Admit: 2021-01-14 | Discharge: 2021-01-14 | Disposition: A | Discharge status: Home / Self Care | Payer: Medicaid Other | Attending: Medical Oncology | Admitting: Medical Oncology

## 2021-01-14 ENCOUNTER — Other Ambulatory Visit: Payer: Self-pay

## 2021-01-14 ENCOUNTER — Encounter (HOSPITAL_COMMUNITY): Payer: Self-pay | Admitting: Emergency Medicine

## 2021-01-14 ENCOUNTER — Telehealth (HOSPITAL_COMMUNITY): Payer: Self-pay | Admitting: Medical Oncology

## 2021-01-14 DIAGNOSIS — M25562 Pain in left knee: Secondary | ICD-10-CM

## 2021-01-14 DIAGNOSIS — G8929 Other chronic pain: Secondary | ICD-10-CM | POA: Diagnosis not present

## 2021-01-14 DIAGNOSIS — Z89512 Acquired absence of left leg below knee: Secondary | ICD-10-CM | POA: Diagnosis not present

## 2021-01-14 MED ORDER — METHOCARBAMOL 750 MG PO TABS: 750.0000 mg | ORAL_TABLET | Freq: Two times a day (BID) | ORAL | 0 refills | Status: DC

## 2021-01-14 MED ORDER — MELOXICAM 15 MG PO TABS: 15.0000 mg | ORAL_TABLET | Freq: Every day | ORAL | 0 refills | Status: DC | PRN

## 2021-01-14 NOTE — ED Provider Notes (Signed)
[facility]    CSN: [id number] Arrival date & time: 01/14/21  7371      History   Chief Complaint Chief Complaint  Patient presents with  . Leg Pain    left    HPI [name] is a 40 y.o. male.   HPI   Leg Pain: Medically complex patient presents with a 2 month history of left leg pain. He reports that this pain started about 2 months ago when he sustained a fall. He reports initially that he was not seen after this fall but then states that he was seen at [facility]. X ray report shown below. He reports no new falls or new pain. He states that he was previously seen by two specialist who were prescribing pain medication for him but they are no longer providing pain medication. He reports that his goal of todays appointment is to get pain medication. No history of GI bleed or NSAID intolerance per patient.   "CLINICAL DATA:  40 year old male status post fall out of wheelchair yesterday. Pain.  EXAM: LEFT KNEE - COMPLETE 4+ VIEW  COMPARISON:  11/15/2011.  FINDINGS: Chronic below the knee amputation, stable since 2012. Healed fracture of the distal left femur metadiaphysis which was acute in 2012. Dystrophic appearance of the patella. No acute osseous abnormality identified. No discrete soft tissue injury identified.  IMPRESSION: No acute osseous abnormality identified. Chronic BKA and healed 2012 distal left femur fracture.   Electronically Signed   By: Genevie Ann M.D.   On: 12/11/2020 05:45"  Past Medical History:  Diagnosis Date  . Alcohol abuse   . Seizures ([place])   . Stroke [facility])     Patient Active Problem List   Diagnosis Date Noted  . 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 Fever 05/30/2020  . Positive blood culture 05/30/2020  . Sinusitis 05/30/2020  . Delirium 05/30/2020  . Normocytic anemia 05/30/2020  . Encephalopathy acute   . Acute hypoxemic respiratory failure ([place])   . Elective surgery   . S/p left hip fracture   .  Respiratory failure ([place])   . Seizure ([place]) 05/23/2020  . Sepsis ([place]) 06/05/2016  . Thrombocytopenia ([place]) 06/05/2016  . Alcohol abuse with intoxication ([place]) 06/05/2016  . Fatty liver 06/05/2016  . Asthma 06/05/2016  . Fall at home 05/03/2012  . postural orthostatic tachycardia 05/03/2012  . Hypokalemia 05/03/2012  . Steatosis of liver 05/03/2012  . Gallstones 05/03/2012  . Thoracic ascending aortic aneurysm ([place]) 05/03/2012  . 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 Tobacco abuse 05/03/2012  . Syncope 04/27/2012  . Alcohol abuse 04/27/2012  . Hx of BKA, left ([place]) 04/27/2012    Past Surgical History:  Procedure Laterality Date  . Above knee amputation of left leg    . BELOW KNEE LEG AMPUTATION     LEFT  . below the knee amputation    . INTRAMEDULLARY (IM) NAIL INTERTROCHANTERIC Left 05/25/2020   Procedure: INTRAMEDULLARY (IM) NAIL INTERTROCHANTRIC;  Surgeon: Shona Needles, MD;  Location: [facility];  Service: Orthopedics;  Laterality: Left;       Home Medications    Prior to Admission medications   Medication Sig Start Date End Date Taking? Authorizing Provider  cholecalciferol (VITAMIN D) 25 MCG tablet Take 2 tablets (2,000 Units total) by mouth daily. 06/08/20   Swayze, Ava, DO  folic acid (FOLVITE) 1 MG tablet Take 1 tablet (1 mg total) by mouth daily. 06/08/20   Swayze, Ava, DO  furosemide (LASIX) 20 MG  tablet Take 1 tablet (20 mg total) by mouth daily. 06/07/20 06/07/21  Swayze, Ava, DO  ibuprofen (ADVIL) 600 MG tablet Take 1 tablet (600 mg total) by mouth every 6 (six) hours as needed. 12/11/20   Charlann Lange, PA-C  levETIRAcetam (KEPPRA) 750 MG tablet Take 1 tablet (750 mg total) by mouth 2 (two) times daily. 06/07/20   Swayze, Ava, DO  methocarbamol (ROBAXIN) 750 MG tablet Take 1 tablet (750 mg total) by mouth 2 (two) times daily. 12/11/20   Charlann Lange, PA-C  Multiple Vitamin (MULTIVITAMIN WITH MINERALS) TABS tablet Place 1 tablet into feeding tube daily.  06/08/20   Swayze, Ava, DO  nicotine (NICODERM CQ - DOSED IN MG/24 HOURS) 21 mg/24hr patch Place 1 patch (21 mg total) onto the skin daily. 06/08/20   Swayze, Ava, DO  potassium chloride (KLOR-CON) 10 MEQ tablet Take 1 tablet (10 mEq total) by mouth daily. 06/07/20   Swayze, Ava, DO  thiamine 100 MG tablet Place 1 tablet (100 mg total) into feeding tube daily. 06/08/20   Swayze, Ava, DO  Vitamin D, Ergocalciferol, (DRISDOL) 1.25 MG (50000 UNIT) CAPS capsule Take 1 capsule (50,000 Units total) by mouth every 7 (seven) days. 06/10/20   Swayze, Ava, DO    Family History Family History  Problem Relation Age of Onset  . Heart attack Mother        Negative Hx    Social History Social History   Tobacco Use  . Smoking status: Current Every Day Smoker    Packs/day: 0.33    Years: 20.00    Pack years: 6.60    Types: Cigarettes  . Smokeless tobacco: Never Used  Substance Use Topics  . Alcohol use: Yes    Alcohol/week: 2.0 standard drinks    Types: 2 Cans of beer per week    Comment: not every day  . Drug use: No     Allergies   Patient has no known allergies.   Review of Systems Review of Systems  As stated above in HPI  Physical Exam Triage Vital Signs ED Triage Vitals [01/14/21 1021]  Enc Vitals Group     BP 95/75     Pulse Rate (!) 112     Resp 20     Temp 98.4 F (36.9 C)     Temp Source Oral     SpO2 98 %     Weight 220 lb 0.3 oz (99.8 kg)     Height 5\' 9"  (1.753 m)     Head Circumference      Peak Flow      Pain Score 8     Pain Loc      Pain Edu?      Excl. in [place]?    No data found.  Updated Vital Signs BP 95/75 (BP Location: Right Arm)   Pulse (!) 112   Temp 98.4 F (36.9 C) (Oral)   Resp 20   Ht 5\' 9"  (1.753 m)   Wt 220 lb 0.3 oz (99.8 kg)   SpO2 98%   BMI 32.49 kg/m   Physical Exam Constitutional:      General: He is not in acute distress.    Appearance: Normal appearance. He is not ill-appearing or toxic-appearing.  Musculoskeletal:         General: No swelling or tenderness.     Left lower leg: No edema.  Skin:    General: Skin is warm and dry.     Comments: No evidence of skin  breakdown of left knee  Neurological:     Mental Status: He is alert.     UC Treatments / Results  Labs (all labs ordered are listed, but only abnormal results are displayed) Labs Reviewed - No data to display  EKG   Radiology No results found.  Procedures Procedures (including critical care time)  Medications Ordered in UC Medications - No data to display  Initial Impression / Assessment and Plan / UC Course  I have reviewed the triage vital signs and the nursing notes.  Pertinent labs & imaging results that were available during my care of the patient were reviewed by me and considered in my medical decision making (see chart for details).    New to me but chronic in nature. Reviewed PMPD and PMH. I am concerned with his history of polysubstance use disorder that opioid prescription pain medication can be deadly. For this reason I verified his last renal function lab and elected to start him on Mobic PRN x 14 days for pain. Discussed how to take along with common potential side effects and precautions. I have also recommended that he contact pain management today to schedule a new patient visit. Red flags in terms of his symptoms discussed as well.   Final Clinical Impressions(s) / UC Diagnoses   Final diagnoses:  None   Discharge Instructions   None    ED Prescriptions    None     PDMP not reviewed this encounter.   Hughie Closs, [place] 01/14/21 1142

## 2021-01-14 NOTE — Telephone Encounter (Signed)
Pt requested

## 2021-01-14 NOTE — ED Triage Notes (Signed)
Pt presents today with left leg pain x "a couple months since they put that rod in there". Reports fall approx 2 mos ago. Ambulatory with a cane.

## 2021-07-08 ENCOUNTER — Other Ambulatory Visit: Payer: Self-pay

## 2021-07-08 ENCOUNTER — Emergency Department (HOSPITAL_COMMUNITY): Payer: Medicaid Other

## 2021-07-08 ENCOUNTER — Inpatient Hospital Stay (HOSPITAL_COMMUNITY)
Admission: EM | Admit: 2021-07-08 | Discharge: 2021-07-12 | DRG: 150 | Discharge status: Against Medical Advice | Payer: Medicaid Other | Attending: Internal Medicine | Admitting: Internal Medicine

## 2021-07-08 ENCOUNTER — Encounter (HOSPITAL_COMMUNITY): Payer: Self-pay | Admitting: Emergency Medicine

## 2021-07-08 DIAGNOSIS — F1410 Cocaine abuse, uncomplicated: Secondary | ICD-10-CM | POA: Diagnosis present

## 2021-07-08 DIAGNOSIS — G40909 Epilepsy, unspecified, not intractable, without status epilepticus: Secondary | ICD-10-CM | POA: Diagnosis present

## 2021-07-08 DIAGNOSIS — Z8249 Family history of ischemic heart disease and other diseases of the circulatory system: Secondary | ICD-10-CM

## 2021-07-08 DIAGNOSIS — Z781 Physical restraint status: Secondary | ICD-10-CM

## 2021-07-08 DIAGNOSIS — D500 Iron deficiency anemia secondary to blood loss (chronic): Secondary | ICD-10-CM

## 2021-07-08 DIAGNOSIS — G9349 Other encephalopathy: Secondary | ICD-10-CM | POA: Diagnosis present

## 2021-07-08 DIAGNOSIS — I5020 Unspecified systolic (congestive) heart failure: Secondary | ICD-10-CM | POA: Diagnosis present

## 2021-07-08 DIAGNOSIS — E876 Hypokalemia: Secondary | ICD-10-CM | POA: Diagnosis present

## 2021-07-08 DIAGNOSIS — D6489 Other specified anemias: Secondary | ICD-10-CM | POA: Diagnosis not present

## 2021-07-08 DIAGNOSIS — Z89612 Acquired absence of left leg above knee: Secondary | ICD-10-CM | POA: Diagnosis not present

## 2021-07-08 DIAGNOSIS — R7401 Elevation of levels of liver transaminase levels: Secondary | ICD-10-CM

## 2021-07-08 DIAGNOSIS — D6959 Other secondary thrombocytopenia: Secondary | ICD-10-CM | POA: Diagnosis present

## 2021-07-08 DIAGNOSIS — I10 Essential (primary) hypertension: Secondary | ICD-10-CM | POA: Diagnosis not present

## 2021-07-08 DIAGNOSIS — R040 Epistaxis: Principal | ICD-10-CM | POA: Diagnosis present

## 2021-07-08 DIAGNOSIS — F17210 Nicotine dependence, cigarettes, uncomplicated: Secondary | ICD-10-CM | POA: Diagnosis present

## 2021-07-08 DIAGNOSIS — F10231 Alcohol dependence with withdrawal delirium: Secondary | ICD-10-CM

## 2021-07-08 DIAGNOSIS — Z8673 Personal history of transient ischemic attack (TIA), and cerebral infarction without residual deficits: Secondary | ICD-10-CM | POA: Diagnosis not present

## 2021-07-08 DIAGNOSIS — F1510 Other stimulant abuse, uncomplicated: Secondary | ICD-10-CM | POA: Diagnosis present

## 2021-07-08 DIAGNOSIS — Z20822 Contact with and (suspected) exposure to covid-19: Secondary | ICD-10-CM | POA: Diagnosis present

## 2021-07-08 DIAGNOSIS — F1010 Alcohol abuse, uncomplicated: Secondary | ICD-10-CM | POA: Diagnosis not present

## 2021-07-08 DIAGNOSIS — I110 Hypertensive heart disease with heart failure: Secondary | ICD-10-CM | POA: Diagnosis present

## 2021-07-08 DIAGNOSIS — R578 Other shock: Secondary | ICD-10-CM | POA: Diagnosis present

## 2021-07-08 DIAGNOSIS — F10931 Alcohol use, unspecified with withdrawal delirium: Secondary | ICD-10-CM

## 2021-07-08 DIAGNOSIS — D62 Acute posthemorrhagic anemia: Secondary | ICD-10-CM | POA: Diagnosis present

## 2021-07-08 DIAGNOSIS — F79 Unspecified intellectual disabilities: Secondary | ICD-10-CM | POA: Diagnosis present

## 2021-07-08 DIAGNOSIS — Z79899 Other long term (current) drug therapy: Secondary | ICD-10-CM | POA: Diagnosis not present

## 2021-07-08 DIAGNOSIS — D696 Thrombocytopenia, unspecified: Secondary | ICD-10-CM | POA: Diagnosis not present

## 2021-07-08 DIAGNOSIS — K760 Fatty (change of) liver, not elsewhere classified: Secondary | ICD-10-CM | POA: Diagnosis present

## 2021-07-08 DIAGNOSIS — J45909 Unspecified asthma, uncomplicated: Secondary | ICD-10-CM | POA: Diagnosis present

## 2021-07-08 DIAGNOSIS — K920 Hematemesis: Secondary | ICD-10-CM | POA: Diagnosis not present

## 2021-07-08 DIAGNOSIS — I959 Hypotension, unspecified: Secondary | ICD-10-CM

## 2021-07-08 DIAGNOSIS — D539 Nutritional anemia, unspecified: Secondary | ICD-10-CM | POA: Diagnosis present

## 2021-07-08 DIAGNOSIS — F10239 Alcohol dependence with withdrawal, unspecified: Secondary | ICD-10-CM | POA: Diagnosis present

## 2021-07-08 DIAGNOSIS — R451 Restlessness and agitation: Secondary | ICD-10-CM | POA: Diagnosis present

## 2021-07-08 LAB — CBC WITH DIFFERENTIAL/PLATELET
Abs Immature Granulocytes: 0.03 10*3/uL (ref 0.00–0.07)
Basophils Absolute: 0.1 10*3/uL (ref 0.0–0.1)
Basophils Relative: 1 %
Eosinophils Absolute: 0.1 10*3/uL (ref 0.0–0.5)
Eosinophils Relative: 1 %
HCT: 21.6 % — ABNORMAL LOW (ref 39.0–52.0)
Hemoglobin: 6.8 g/dL — CL (ref 13.0–17.0)
Immature Granulocytes: 0 %
Lymphocytes Relative: 52 %
Lymphs Abs: 4.5 10*3/uL — ABNORMAL HIGH (ref 0.7–4.0)
MCH: 24.1 pg — ABNORMAL LOW (ref 26.0–34.0)
MCHC: 31.5 g/dL (ref 30.0–36.0)
MCV: 76.6 fL — ABNORMAL LOW (ref 80.0–100.0)
Monocytes Absolute: 0.6 10*3/uL (ref 0.1–1.0)
Monocytes Relative: 6 %
Neutro Abs: 3.5 10*3/uL (ref 1.7–7.7)
Neutrophils Relative %: 40 %
Platelets: 64 10*3/uL — ABNORMAL LOW (ref 150–400)
RBC: 2.82 MIL/uL — ABNORMAL LOW (ref 4.22–5.81)
RDW: 23.7 % — ABNORMAL HIGH (ref 11.5–15.5)
WBC: 8.8 10*3/uL (ref 4.0–10.5)
nRBC: 0.2 % (ref 0.0–0.2)

## 2021-07-08 LAB — URINALYSIS, ROUTINE W REFLEX MICROSCOPIC
Bilirubin Urine: NEGATIVE
Glucose, UA: NEGATIVE mg/dL
Hgb urine dipstick: NEGATIVE
Ketones, ur: NEGATIVE mg/dL
Leukocytes,Ua: NEGATIVE
Nitrite: NEGATIVE
Protein, ur: NEGATIVE mg/dL
Specific Gravity, Urine: 1.01 (ref 1.005–1.030)
pH: 6 (ref 5.0–8.0)

## 2021-07-08 LAB — PREPARE RBC (CROSSMATCH)

## 2021-07-08 LAB — DIC (DISSEMINATED INTRAVASCULAR COAGULATION)PANEL
D-Dimer, Quant: 0.93 ug/mL-FEU — ABNORMAL HIGH (ref 0.00–0.50)
D-Dimer, Quant: 1.07 ug/mL-FEU — ABNORMAL HIGH (ref 0.00–0.50)
Fibrinogen: 235 mg/dL (ref 210–475)
Fibrinogen: 223 mg/dL (ref 210–475)
INR: 1.2 (ref 0.8–1.2)
INR: 1.1 (ref 0.8–1.2)
Platelets: 52 10*3/uL — ABNORMAL LOW (ref 150–400)
Platelets: 43 10*3/uL — ABNORMAL LOW (ref 150–400)
Prothrombin Time: 15.5 seconds — ABNORMAL HIGH (ref 11.4–15.2)
Prothrombin Time: 14.3 seconds (ref 11.4–15.2)
Smear Review: NONE SEEN
Smear Review: NONE SEEN
aPTT: 39 seconds — ABNORMAL HIGH (ref 24–36)
aPTT: 40 seconds — ABNORMAL HIGH (ref 24–36)

## 2021-07-08 LAB — COMPREHENSIVE METABOLIC PANEL
ALT: 40 U/L (ref 0–44)
AST: 171 U/L — ABNORMAL HIGH (ref 15–41)
Albumin: 2.8 g/dL — ABNORMAL LOW (ref 3.5–5.0)
Alkaline Phosphatase: 123 U/L (ref 38–126)
Anion gap: 12 (ref 5–15)
BUN: 7 mg/dL (ref 6–20)
CO2: 22 mmol/L (ref 22–32)
Calcium: 7.9 mg/dL — ABNORMAL LOW (ref 8.9–10.3)
Chloride: 101 mmol/L (ref 98–111)
Creatinine, Ser: 0.98 mg/dL (ref 0.61–1.24)
GFR, Estimated: >60 mL/min (ref >60)
Glucose, Bld: 147 mg/dL — ABNORMAL HIGH (ref 70–99)
Potassium: 3.4 mmol/L — ABNORMAL LOW (ref 3.5–5.1)
Sodium: 135 mmol/L (ref 135–145)
Total Bilirubin: 1.1 mg/dL (ref 0.3–1.2)
Total Protein: 6.7 g/dL (ref 6.5–8.1)

## 2021-07-08 LAB — I-STAT CHEM 8, ED
BUN: 3 mg/dL — ABNORMAL LOW (ref 6–20)
Calcium, Ion: 0.97 mmol/L — ABNORMAL LOW (ref 1.15–1.40)
Chloride: 102 mmol/L (ref 98–111)
Creatinine, Ser: 1.2 mg/dL (ref 0.61–1.24)
Glucose, Bld: 129 mg/dL — ABNORMAL HIGH (ref 70–99)
HCT: 26 % — ABNORMAL LOW (ref 39.0–52.0)
Hemoglobin: 8.8 g/dL — ABNORMAL LOW (ref 13.0–17.0)
Potassium: 3.4 mmol/L — ABNORMAL LOW (ref 3.5–5.1)
Sodium: 136 mmol/L (ref 135–145)
TCO2: 20 mmol/L — ABNORMAL LOW (ref 22–32)

## 2021-07-08 LAB — LACTIC ACID, PLASMA
Lactic Acid, Venous: 4.6 mmol/L (ref 0.5–1.9)
Lactic Acid, Venous: 3.1 mmol/L (ref 0.5–1.9)

## 2021-07-08 LAB — APTT: aPTT: 37 seconds — ABNORMAL HIGH (ref 24–36)

## 2021-07-08 LAB — MRSA NEXT GEN BY PCR, NASAL: MRSA by PCR Next Gen: NOT DETECTED

## 2021-07-08 LAB — HEMOGLOBIN AND HEMATOCRIT, BLOOD
HCT: 22.9 % — ABNORMAL LOW (ref 39.0–52.0)
HCT: 20.8 % — ABNORMAL LOW (ref 39.0–52.0)
Hemoglobin: 7.2 g/dL — ABNORMAL LOW (ref 13.0–17.0)
Hemoglobin: 6.7 g/dL — CL (ref 13.0–17.0)

## 2021-07-08 LAB — PROTIME-INR
INR: 1.2 (ref 0.8–1.2)
Prothrombin Time: 14.8 seconds (ref 11.4–15.2)

## 2021-07-08 MED ORDER — DOCUSATE SODIUM 100 MG PO CAPS: 100.0000 mg | ORAL_CAPSULE | Freq: Two times a day (BID) | ORAL | Status: DC | PRN

## 2021-07-08 MED ORDER — PANTOPRAZOLE SODIUM 40 MG IV SOLR: 40.0000 mg | Freq: Two times a day (BID) | INTRAVENOUS | Status: DC

## 2021-07-08 MED ORDER — CHLORHEXIDINE GLUCONATE CLOTH 2 % EX PADS
6.0000 | MEDICATED_PAD | Freq: Every day | CUTANEOUS | Status: DC
  Administered 2021-07-08 – 2021-07-12 (×5): 6 via TOPICAL

## 2021-07-08 MED ORDER — SODIUM CHLORIDE 0.9 % IV BOLUS
1000.0000 mL | Freq: Once | INTRAVENOUS | Status: AC
  Administered 2021-07-08: 1000 mL via INTRAVENOUS

## 2021-07-08 MED ORDER — SODIUM CHLORIDE 0.9 % IV SOLN
750.0000 mg | Freq: Two times a day (BID) | INTRAVENOUS | Status: DC
  Administered 2021-07-09 – 2021-07-12 (×7): 750 mg via INTRAVENOUS
  Filled 2021-07-08 (×7): qty 7.5

## 2021-07-08 MED ORDER — SODIUM CHLORIDE 0.9 % IV SOLN: 10.0000 mL/h | Freq: Once | INTRAVENOUS | Status: DC

## 2021-07-08 MED ORDER — NICOTINE 21 MG/24HR TD PT24
21.0000 mg | MEDICATED_PATCH | Freq: Once | TRANSDERMAL | Status: AC
  Administered 2021-07-08: 21 mg via TRANSDERMAL
  Filled 2021-07-08: qty 1

## 2021-07-08 MED ORDER — FOLIC ACID 5 MG/ML IJ SOLN
1.0000 mg | Freq: Every day | INTRAMUSCULAR | Status: DC
  Administered 2021-07-09 – 2021-07-11 (×3): 1 mg via INTRAVENOUS
  Filled 2021-07-08 (×5): qty 0.2

## 2021-07-08 MED ORDER — LIDOCAINE HCL URETHRAL/MUCOSAL 2 % EX GEL: 1.0000 "application " | Freq: Once | CUTANEOUS | Status: DC

## 2021-07-08 MED ORDER — DEXMEDETOMIDINE HCL IN NACL 200 MCG/50ML IV SOLN
0.4000 ug/kg/h | INTRAVENOUS | Status: DC
  Administered 2021-07-08: 0.8 ug/kg/h via INTRAVENOUS
  Administered 2021-07-08: 0.4 ug/kg/h via INTRAVENOUS
  Administered 2021-07-09: 0.5 ug/kg/h via INTRAVENOUS
  Administered 2021-07-09 (×2): 0.6 ug/kg/h via INTRAVENOUS
  Administered 2021-07-09: 0.4 ug/kg/h via INTRAVENOUS
  Filled 2021-07-08 (×6): qty 50

## 2021-07-08 MED ORDER — ONDANSETRON HCL 4 MG/2ML IJ SOLN
INTRAMUSCULAR | Status: AC
  Administered 2021-07-08: 4 mg via INTRAVENOUS
  Filled 2021-07-08: qty 2

## 2021-07-08 MED ORDER — PANTOPRAZOLE INFUSION (NEW) - SIMPLE MED
8.0000 mg/h | INTRAVENOUS | Status: DC
  Administered 2021-07-08 – 2021-07-09 (×2): 8 mg/h via INTRAVENOUS
  Filled 2021-07-08: qty 80
  Filled 2021-07-08 (×2): qty 100
  Filled 2021-07-08: qty 80

## 2021-07-08 MED ORDER — THIAMINE HCL 100 MG/ML IJ SOLN
100.0000 mg | Freq: Every day | INTRAMUSCULAR | Status: DC
  Administered 2021-07-09 – 2021-07-11 (×3): 100 mg via INTRAVENOUS
  Filled 2021-07-08 (×3): qty 2

## 2021-07-08 MED ORDER — SODIUM CHLORIDE 0.9 % IV SOLN
50.0000 ug/h | INTRAVENOUS | Status: DC
  Administered 2021-07-08 – 2021-07-09 (×2): 50 ug/h via INTRAVENOUS
  Filled 2021-07-08 (×3): qty 1

## 2021-07-08 MED ORDER — POLYETHYLENE GLYCOL 3350 17 G PO PACK: 17.0000 g | PACK | Freq: Every day | ORAL | Status: DC | PRN

## 2021-07-08 MED ORDER — SODIUM CHLORIDE 0.9 % IV SOLN
1.0000 g | Freq: Once | INTRAVENOUS | Status: AC
  Administered 2021-07-08: 1 g via INTRAVENOUS
  Filled 2021-07-08: qty 10

## 2021-07-08 MED ORDER — OCTREOTIDE LOAD VIA INFUSION
100.0000 ug | Freq: Once | INTRAVENOUS | Status: AC
  Administered 2021-07-08: 100 ug via INTRAVENOUS
  Filled 2021-07-08: qty 50

## 2021-07-08 MED ORDER — OXYMETAZOLINE HCL 0.05 % NA SOLN
1.0000 | Freq: Once | NASAL | Status: AC
  Administered 2021-07-08: 1 via NASAL
  Filled 2021-07-08: qty 30

## 2021-07-08 MED ORDER — ONDANSETRON HCL 4 MG/2ML IJ SOLN: 4.0000 mg | Freq: Once | INTRAMUSCULAR | Status: AC

## 2021-07-08 MED ORDER — PANTOPRAZOLE SODIUM 40 MG IV SOLR: 40.0000 mg | Freq: Once | INTRAVENOUS | Status: DC

## 2021-07-08 MED ORDER — LEVETIRACETAM IN NACL 1000 MG/100ML IV SOLN
1000.0000 mg | Freq: Once | INTRAVENOUS | Status: AC
  Administered 2021-07-08: 1000 mg via INTRAVENOUS
  Filled 2021-07-08: qty 100

## 2021-07-08 MED ORDER — PANTOPRAZOLE 80MG IVPB - SIMPLE MED
80.0000 mg | Freq: Once | INTRAVENOUS | Status: AC
  Administered 2021-07-08: 80 mg via INTRAVENOUS
  Filled 2021-07-08: qty 80

## 2021-07-08 MED ORDER — OXYMETAZOLINE HCL 0.05 % NA SOLN: 1.0000 | Freq: Once | NASAL | Status: DC

## 2021-07-08 MED ORDER — SODIUM CHLORIDE 0.9 % IV SOLN: 1.0000 g | Freq: Once | INTRAVENOUS | Status: DC

## 2021-07-08 NOTE — Progress Notes (Signed)
[facility] Progress Note Patient Name: [name] DOB: [birth year] MRN: [id number]   Date of Service  07/08/2021  HPI/Events of Note  Anemia - Hgb = 6.7.  eICU Interventions  Plan: Transfuse 1 unit or the 2 units PRBC's previously ordered.      Intervention Category Major Interventions: Other:  Lysle Dingwall 07/08/2021, 10:20 PM

## 2021-07-08 NOTE — Progress Notes (Signed)
Date and time results received: 07/08/21 2130 (use smartphrase ".now" to insert current time)  Test: Hgb Critical Value: 6.7  Name of Provider Notified: E-link  Orders Received? Or Actions Taken?: Awaiting orders, will continue to monitor

## 2021-07-08 NOTE — Consult Note (Signed)
Referring Provider: Dr. Elba Barman - ENT Primary Care Physician:  Patient, No Pcp Per (Inactive) Primary Gastroenterologist:  Althia Forts  Reason for Consultation:  Hematemesis  HPI: [name] is a 40 y.o. male with past medical history of seziures, polysubstance use, thoracic ascending aortic aneurysm, left BKA presenting vor consultation of hematemesis.  Patient presented to the ED today with hypotension and epistaxis.  He states he started having nosebleeding last night.  He reports prior history of nosebleeds.  After he had had epistaxis, he had an episode of hematemesis while in the ED.  Patient states he has never had prior episodes of hematemesis.  He denies any abdominal pain, changes in stool, melena, or hematochezia.  He denies blood thinner, aspirin, NSAID use (though per chart review, meloxicam is listed as a PRN med).  He drinks 2-3 beers daily.  Per ENT evaluation: No active bleeding from either nare upon my entry into the room.  Examination of the nasal cavity revealed no obvious anterior mass or anterior source of bleeding.  There was no blood running down the posterior oropharyngeal wall on transoral exam.   Past Medical History:  Diagnosis Date   Alcohol abuse    Seizures ([place])    Stroke [facility])     Past Surgical History:  Procedure Laterality Date   Above knee amputation of left leg     BELOW KNEE LEG AMPUTATION     LEFT   below the knee amputation     INTRAMEDULLARY (IM) NAIL INTERTROCHANTERIC Left 05/25/2020   Procedure: INTRAMEDULLARY (IM) NAIL INTERTROCHANTRIC;  Surgeon: Shona Needles, MD;  Location: [facility];  Service: Orthopedics;  Laterality: Left;    Prior to Admission medications   Medication Sig Start Date End Date Taking? Authorizing Provider  cholecalciferol (VITAMIN D) 25 MCG tablet Take 2 tablets (2,000 Units total) by mouth daily. 06/08/20   Swayze, Ava, DO  folic acid (FOLVITE) 1 MG tablet Take 1 tablet (1 mg total) by mouth daily. 06/08/20    Swayze, Ava, DO  furosemide (LASIX) 20 MG tablet Take 1 tablet (20 mg total) by mouth daily. 06/07/20 06/07/21  Swayze, Ava, DO  levETIRAcetam (KEPPRA) 750 MG tablet Take 1 tablet (750 mg total) by mouth 2 (two) times daily. 06/07/20   Swayze, Ava, DO  meloxicam (MOBIC) 15 MG tablet Take 1 tablet (15 mg total) by mouth daily as needed for pain. 01/14/21   Hughie Closs, PA-C  methocarbamol (ROBAXIN) 750 MG tablet Take 1 tablet (750 mg total) by mouth 2 (two) times daily. 01/14/21   Hughie Closs, PA-C  Multiple Vitamin (MULTIVITAMIN WITH MINERALS) TABS tablet Place 1 tablet into feeding tube daily. 06/08/20   Swayze, Ava, DO  nicotine (NICODERM CQ - DOSED IN MG/24 HOURS) 21 mg/24hr patch Place 1 patch (21 mg total) onto the skin daily. 06/08/20   Swayze, Ava, DO  potassium chloride (KLOR-CON) 10 MEQ tablet Take 1 tablet (10 mEq total) by mouth daily. 06/07/20   Swayze, Ava, DO  thiamine 100 MG tablet Place 1 tablet (100 mg total) into feeding tube daily. 06/08/20   Swayze, Ava, DO  Vitamin D, Ergocalciferol, (DRISDOL) 1.25 MG (50000 UNIT) CAPS capsule Take 1 capsule (50,000 Units total) by mouth every 7 (seven) days. 06/10/20   Swayze, Ava, DO    Scheduled Meds:  lidocaine  1 application Topical Once   octreotide  100 mcg Intravenous Once   [START ON 07/12/2021] pantoprazole  40 mg Intravenous Q12H   Continuous Infusions:  sodium chloride     sodium chloride     sodium chloride     octreotide  (SANDOSTATIN)    IV infusion     pantoprazole     pantoprazole     PRN Meds:.  Allergies as of 07/08/2021   (No Known Allergies)    Family History  Problem Relation Age of Onset   Heart attack Mother        Negative Hx    Social History   Socioeconomic History   Marital status: Single    Spouse name: Not on file   Number of children: Not on file   Years of education: Not on file   Highest education level: Not on file  Occupational History   Not on file  Tobacco Use   Smoking status:  Every Day    Packs/day: 0.33    Years: 20.00    Pack years: 6.60    Types: Cigarettes   Smokeless tobacco: Never  Substance and Sexual Activity   Alcohol use: Yes    Alcohol/week: 2.0 standard drinks    Types: 2 Cans of beer per week    Comment: not every day   Drug use: No   Sexual activity: Not on file  Other Topics Concern   Not on file  Social History Narrative   ** Merged History Encounter **       Social Determinants of Health   Financial Resource Strain: Not on file  Food Insecurity: Not on file  Transportation Needs: Not on file  Physical Activity: Not on file  Stress: Not on file  Social Connections: Not on file  Intimate Partner Violence: Not on file    Review of Systems: Review of Systems  Constitutional:  Negative for chills and fever.  HENT:  Positive for nosebleeds. Negative for sore throat.   Eyes:  Negative for pain and redness.  Respiratory:  Negative for cough and shortness of breath.   Cardiovascular:  Negative for chest pain and palpitations.  Gastrointestinal:  Positive for nausea and vomiting. Negative for abdominal pain, blood in stool, constipation, diarrhea, heartburn and melena.  Genitourinary:  Negative for flank pain and hematuria.  Musculoskeletal:  Negative for falls and joint pain.  Skin:  Negative for itching and rash.  Neurological:  Negative for loss of consciousness and weakness.  Endo/Heme/Allergies:  Negative for polydipsia. Does not bruise/bleed easily.  Psychiatric/Behavioral:  Positive for substance abuse. The patient is not nervous/anxious.     Physical Exam: Vital signs: Vitals:   07/08/21 1640 07/08/21 1644  BP: 114/64   Pulse: (!) 114 97  Resp: (!) 27   Temp: 98.2 F (36.8 C)   SpO2: 100% 100%     Physical Exam Vitals reviewed.  Constitutional:      General: He is not in acute distress. HENT:     Head: Normocephalic and atraumatic.     Nose:     Comments: Epistatic catheter in left nare, old blood present  below nares    Mouth/Throat:     Mouth: Mucous membranes are moist.     Pharynx: Oropharynx is clear.  Eyes:     Extraocular Movements: Extraocular movements intact.     Comments: Conjunctival pallor  Cardiovascular:     Rate and Rhythm: Normal rate and regular rhythm.  Pulmonary:     Effort: Pulmonary effort is normal. No respiratory distress.  Abdominal:     General: Bowel sounds are normal. There is no distension.     Palpations:  Abdomen is soft. There is no mass.     Tenderness: There is no abdominal tenderness. There is no guarding or rebound.     Hernia: No hernia is present.  Musculoskeletal:        General: No swelling or tenderness.     Cervical back: Normal range of motion and neck supple.     Comments: Left BKA  Skin:    General: Skin is warm and dry.  Neurological:     General: No focal deficit present.     Mental Status: He is alert and oriented to person, place, and time. Mental status is at baseline.  Psychiatric:        Mood and Affect: Mood normal.        Behavior: Behavior normal. Behavior is cooperative.     GI:  Lab Results: Recent Labs    07/08/21 1345 07/08/21 1417 07/08/21 1626  WBC 8.8  --   --   HGB 6.8* 8.8* 7.2*  HCT 21.6* 26.0* 22.9*  PLT 64*  --   --    BMET Recent Labs    07/08/21 1345 07/08/21 1417  NA 135 136  K 3.4* 3.4*  CL 101 102  CO2 22  --   GLUCOSE 147* 129*  BUN 7 3*  CREATININE 0.98 1.20  CALCIUM 7.9*  --    LFT Recent Labs    07/08/21 1345  PROT 6.7  ALBUMIN 2.8*  AST 171*  ALT 40  ALKPHOS 123  BILITOT 1.1   PT/INR Recent Labs    07/08/21 1345  LABPROT 14.8  INR 1.2     Studies/Results: DG Chest Portable 1 View  Result Date: 07/08/2021 CLINICAL DATA:  Hypertension EXAM: PORTABLE CHEST 1 VIEW COMPARISON:  Chest x-ray dated May 29, 2020 FINDINGS: Patient rotation limits evaluation of the heart and mediastinum. Mild right basilar atelectasis. Lungs are otherwise clear. No large pleural effusion or  evidence of pneumothorax. IMPRESSION: Right basilar atelectasis.  Lungs otherwise clear. Electronically Signed   By: Yetta Glassman MD   On: 07/08/2021 16:22    Impression: Epistaxis followed by hematemesis.  History is suggestive of vomiting of swallowed blood in the setting of epistaxis.It is possible that he could have underlying liver disease in the setting of alcohol use, which could predispose him to portal hypertensive gastropathy or varices.  He does have thrombocytopenia but normal INR.  AST elevated, but other LFTs currently within normal limits.  However, we are still suspicious on for epistaxis as source of blood loss.   -Hemoglobin 6.8 on arrival, decreased from baseline 8.8 in 05/2020.  No more recent baseline is on file. -Thrombocytopenia (64K/uL) -Normal INR (1.2)  Plan: Continue IV Protonix and octreotide for now.  Continue to monitor H&H with transfusion as needed to maintain hemoglobin greater than 7.  Monitor clinical course.  If concern for ongoing GI blood loss, consider proceeding with EGD tomorrow (or sooner if necessary). However, I discussed the care with Dr. Paulita Fujita, and we are still suspicious on for epistaxis as source of blood loss.    We will revisit the patient tomorrow.  However, please contact us sooner if needed.  [facility] will follow.   LOS: 0 days   Salley Slaughter  PA-C 07/08/2021, 4:55 PM  Contact #  [phone]

## 2021-07-08 NOTE — Plan of Care (Signed)
Pt VS are WDL and hgb has stabilized Problem: Education: Goal: Knowledge of disease or condition will improve Outcome: Progressing Goal: Understanding of discharge needs will improve Outcome: Progressing   Problem: Health Behavior/Discharge Planning: Goal: Ability to identify changes in lifestyle to reduce recurrence of condition will improve Outcome: Progressing Goal: Identification of resources available to assist in meeting health care needs will improve Outcome: Progressing   Problem: Physical Regulation: Goal: Complications related to the disease process, condition or treatment will be avoided or minimized Outcome: Progressing   Problem: Safety: Goal: Ability to remain free from injury will improve Outcome: Progressing   Problem: Education: Goal: Ability to identify signs and symptoms of gastrointestinal bleeding will improve Outcome: Progressing   Problem: Bowel/Gastric: Goal: Will show no signs and symptoms of gastrointestinal bleeding Outcome: Progressing   Problem: Fluid Volume: Goal: Will show no signs and symptoms of excessive bleeding Outcome: Progressing   Problem: Clinical Measurements: Goal: Complications related to the disease process, condition or treatment will be avoided or minimized Outcome: Progressing  .

## 2021-07-08 NOTE — Progress Notes (Signed)
[facility] Progress Note Patient Name: [name] DOB: [birth year] MRN: [id number]   Date of Service  07/08/2021  HPI/Events of Note  Last Hgb = 7.2. Order to transfuse 2 units written.   eICU Interventions  Plan: Check H/H STAT prior to deciding how many units to transfuse.     Intervention Category Major Interventions: Other:  Lysle Dingwall 07/08/2021, 8:07 PM

## 2021-07-08 NOTE — ED Provider Notes (Addendum)
[facility] DEPT Provider Note   CSN: [id number] Arrival date & time: 07/08/21  1330     History Chief Complaint  Patient presents with   Hypotension   Epistaxis    [name] is a 40 y.o. male.  HPI     40 year old male with a history of seizure disorder, amphetamine use, alcohol abuse, fatty liver, thoracic ascending aortic aneurysm, history of BKA presents with concern for epistaxis and hypotension.  Patient reports that he had a nosebleed that started last night, reports is coming from his bilateral nostrils but appeared to be worse from the left.  Report it happened last night, then stopped, then proceeded to start again this morning around 7 AM.  Feels like it is going down the back of his throat.  When EMS arrived, they found his blood pressure to be 88 systolic, and the patient stood up, took a few steps, and had 2 syncopal episodes.  His blood pressures were 60/50 with EMS.  He reports that he had 1 episode of vomiting with EMS this morning, but otherwise denied any nausea or vomiting.  No noted black or bloody stools.  Has not had fevers, no chest pain.  Reports he has chronic shortness of breath that he relates to his asthma but no acute changes.  Denies any changes of medications.  Denies taking anything this morning or drug use.  Reports that he does drink alcohol regularly.  Past Medical History:  Diagnosis Date   Alcohol abuse    Seizures ([place])    Stroke [facility])     Patient Active Problem List   Diagnosis Date Noted   Epistaxis 07/08/2021   Hematemesis with nausea    Posterior epistaxis    Amphetamine user ([place]) 05/30/2020   Fever 05/30/2020   Positive blood culture 05/30/2020   Sinusitis 05/30/2020   Delirium 05/30/2020   Other specified anemias 05/30/2020   Encephalopathy acute    Acute hypoxemic respiratory failure ([place])    Elective surgery    S/p left hip fracture    Respiratory failure ([place])    Seizure ([place])  05/23/2020   Sepsis ([place]) 06/05/2016   Thrombocytopenia ([place]) 06/05/2016   Alcohol abuse with intoxication ([place]) 06/05/2016   Fatty liver 06/05/2016   Asthma 06/05/2016   Fall at home 05/03/2012   postural orthostatic tachycardia 05/03/2012   Hypokalemia 05/03/2012   Steatosis of liver 05/03/2012   Gallstones 05/03/2012   Thoracic ascending aortic aneurysm ([place]) 05/03/2012   History of mental retardation 05/03/2012   Tobacco abuse 05/03/2012   Syncope 04/27/2012   Alcohol abuse 04/27/2012   Hx of BKA, left ([place]) 04/27/2012    Past Surgical History:  Procedure Laterality Date   Above knee amputation of left leg     BELOW KNEE LEG AMPUTATION     LEFT   below the knee amputation     INTRAMEDULLARY (IM) NAIL INTERTROCHANTERIC Left 05/25/2020   Procedure: INTRAMEDULLARY (IM) NAIL INTERTROCHANTRIC;  Surgeon: Shona Needles, MD;  Location: [facility];  Service: Orthopedics;  Laterality: Left;       Family History  Problem Relation Age of Onset   Heart attack Mother        Negative Hx    Social History   Tobacco Use   Smoking status: Every Day    Packs/day: 0.33    Years: 20.00    Pack years: 6.60    Types: Cigarettes   Smokeless tobacco: Never  Substance Use Topics   Alcohol  use: Yes    Alcohol/week: 2.0 standard drinks    Types: 2 Cans of beer per week    Comment: not every day   Drug use: No    Home Medications Prior to Admission medications   Medication Sig Start Date End Date Taking? Authorizing Provider  cholecalciferol (VITAMIN D) 25 MCG tablet Take 2 tablets (2,000 Units total) by mouth daily. 06/08/20   Swayze, Ava, DO  folic acid (FOLVITE) 1 MG tablet Take 1 tablet (1 mg total) by mouth daily. 06/08/20   Swayze, Ava, DO  furosemide (LASIX) 20 MG tablet Take 1 tablet (20 mg total) by mouth daily. 06/07/20 06/07/21  Swayze, Ava, DO  levETIRAcetam (KEPPRA) 750 MG tablet Take 1 tablet (750 mg total) by mouth 2 (two) times daily. 06/07/20   Swayze, Ava, DO  meloxicam  (MOBIC) 15 MG tablet Take 1 tablet (15 mg total) by mouth daily as needed for pain. 01/14/21   Hughie Closs, PA-C  methocarbamol (ROBAXIN) 750 MG tablet Take 1 tablet (750 mg total) by mouth 2 (two) times daily. 01/14/21   Hughie Closs, PA-C  Multiple Vitamin (MULTIVITAMIN WITH MINERALS) TABS tablet Place 1 tablet into feeding tube daily. 06/08/20   Swayze, Ava, DO  nicotine (NICODERM CQ - DOSED IN MG/24 HOURS) 21 mg/24hr patch Place 1 patch (21 mg total) onto the skin daily. 06/08/20   Swayze, Ava, DO  potassium chloride (KLOR-CON) 10 MEQ tablet Take 1 tablet (10 mEq total) by mouth daily. 06/07/20   Swayze, Ava, DO  thiamine 100 MG tablet Place 1 tablet (100 mg total) into feeding tube daily. 06/08/20   Swayze, Ava, DO  Vitamin D, Ergocalciferol, (DRISDOL) 1.25 MG (50000 UNIT) CAPS capsule Take 1 capsule (50,000 Units total) by mouth every 7 (seven) days. 06/10/20   Swayze, Ava, DO    Allergies    Patient has no known allergies.  Review of Systems   Review of Systems  Constitutional:  Negative for fever.  HENT:  Positive for nosebleeds. Negative for sore throat.   Eyes:  Negative for visual disturbance.  Respiratory:  Positive for shortness of breath (asthma). Negative for cough.   Cardiovascular:  Negative for chest pain.  Gastrointestinal:  Positive for nausea and vomiting (one episode). Negative for abdominal pain and blood in stool.  Genitourinary:  Negative for difficulty urinating.  Musculoskeletal:  Negative for back pain and neck stiffness.  Skin:  Negative for rash.  Neurological:  Negative for syncope and headaches.   Physical Exam Updated Vital Signs BP 124/83   Pulse 83   Temp 98.2 F (36.8 C) (Oral)   Resp 15   Ht '5\' 9"'$  (1.753 m)   Wt 99.8 kg   SpO2 99%   BMI 32.49 kg/m   Physical Exam Vitals and nursing note reviewed.  Constitutional:      General: He is not in acute distress.    Appearance: He is well-developed. He is not diaphoretic.  HENT:     Head:  Normocephalic and atraumatic.  Eyes:     Conjunctiva/sclera: Conjunctivae normal.  Cardiovascular:     Rate and Rhythm: Normal rate and regular rhythm.     Heart sounds: Normal heart sounds. No murmur heard.   No friction rub. No gallop.  Pulmonary:     Effort: Pulmonary effort is normal. No respiratory distress.     Breath sounds: Normal breath sounds. No wheezing or rales.  Abdominal:     General: There is no distension.  Palpations: Abdomen is soft.     Tenderness: There is no abdominal tenderness. There is no guarding.  Musculoskeletal:     Cervical back: Normal range of motion.  Skin:    General: Skin is warm and dry.  Neurological:     Mental Status: He is alert and oriented to person, place, and time.    ED Results / Procedures / Treatments   Labs (all labs ordered are listed, but only abnormal results are displayed) Labs Reviewed  CBC WITH DIFFERENTIAL/PLATELET - Abnormal; Notable for the following components:      Result Value   RBC 2.82 (*)    Hemoglobin 6.8 (*)    HCT 21.6 (*)    MCV 76.6 (*)    MCH 24.1 (*)    RDW 23.7 (*)    Platelets 64 (*)    Lymphs Abs 4.5 (*)    All other components within normal limits  COMPREHENSIVE METABOLIC PANEL - Abnormal; Notable for the following components:   Potassium 3.4 (*)    Glucose, Bld 147 (*)    Calcium 7.9 (*)    Albumin 2.8 (*)    AST 171 (*)    All other components within normal limits  APTT - Abnormal; Notable for the following components:   aPTT 37 (*)    All other components within normal limits  LACTIC ACID, PLASMA - Abnormal; Notable for the following components:   Lactic Acid, Venous 4.6 (*)    All other components within normal limits  DIC (DISSEMINATED INTRAVASCULAR COAGULATION)PANEL - Abnormal; Notable for the following components:   Prothrombin Time 15.5 (*)    aPTT 39 (*)    D-Dimer, Quant 0.93 (*)    All other components within normal limits  HEMOGLOBIN AND HEMATOCRIT, BLOOD - Abnormal; Notable  for the following components:   Hemoglobin 7.2 (*)    HCT 22.9 (*)    All other components within normal limits  I-STAT CHEM 8, ED - Abnormal; Notable for the following components:   Potassium 3.4 (*)    BUN 3 (*)    Glucose, Bld 129 (*)    Calcium, Ion 0.97 (*)    TCO2 20 (*)    Hemoglobin 8.8 (*)    HCT 26.0 (*)    All other components within normal limits  CULTURE, BLOOD (ROUTINE X 2)  CULTURE, BLOOD (ROUTINE X 2)  URINE CULTURE  PROTIME-INR  URINALYSIS, ROUTINE W REFLEX MICROSCOPIC  LACTIC ACID, PLASMA  DIC (DISSEMINATED INTRAVASCULAR COAGULATION)PANEL  DIC (DISSEMINATED INTRAVASCULAR COAGULATION)PANEL  DIC (DISSEMINATED INTRAVASCULAR COAGULATION)PANEL  DIC (DISSEMINATED INTRAVASCULAR COAGULATION)PANEL  HEMOGLOBIN AND HEMATOCRIT, BLOOD  HEMOGLOBIN AND HEMATOCRIT, BLOOD  HEMOGLOBIN AND HEMATOCRIT, BLOOD  HEMOGLOBIN AND HEMATOCRIT, BLOOD  HIV ANTIBODY (ROUTINE TESTING W REFLEX)  MAGNESIUM  PHOSPHORUS  COMPREHENSIVE METABOLIC PANEL  TYPE AND SCREEN  PREPARE RBC (CROSSMATCH)  PREPARE PLATELET PHERESIS  PREPARE RBC (CROSSMATCH)    EKG EKG Interpretation  Date/Time:  Wednesday July 08 2021 13:48:27 EDT Ventricular Rate:  91 PR Interval:  139 QRS Duration: 100 QT Interval:  388 QTC Calculation: 478 R Axis:   34 Text Interpretation: Sinus rhythm Abnormal R-wave progression, early transition Borderline prolonged QT interval No significant change since last tracing Confirmed by Gareth Morgan [phone]) on 07/08/2021 5:38:24 PM  Radiology DG Chest Portable 1 View  Result Date: 07/08/2021 CLINICAL DATA:  Hypertension EXAM: PORTABLE CHEST 1 VIEW COMPARISON:  Chest x-ray dated May 29, 2020 FINDINGS: Patient rotation limits evaluation of the heart and mediastinum. Mild right  basilar atelectasis. Lungs are otherwise clear. No large pleural effusion or evidence of pneumothorax. IMPRESSION: Right basilar atelectasis.  Lungs otherwise clear. Electronically Signed   By: Yetta Glassman MD   On: 07/08/2021 16:22    Procedures .Critical Care  Date/Time: 07/08/2021 5:42 PM Performed by: Gareth Morgan, MD Authorized by: Gareth Morgan, MD   Critical care provider statement:    Critical care time (minutes):  80   Critical care was time spent personally by me on the following activities:  Discussions with consultants, evaluation of patient's response to treatment, examination of patient, ordering and performing treatments and interventions, ordering and review of laboratory studies, ordering and review of radiographic studies, pulse oximetry, re-evaluation of patient's condition, obtaining history from patient or surrogate and review of old charts   Medications Ordered in ED Medications  0.9 %  sodium chloride infusion (has no administration in time range)  0.9 %  sodium chloride infusion (has no administration in time range)  lidocaine (XYLOCAINE) 2 % jelly 1 application (has no administration in time range)  octreotide (SANDOSTATIN) 2 mcg/mL load via infusion 100 mcg (100 mcg Intravenous Bolus from Bag 07/08/21 1719)    And  octreotide (SANDOSTATIN) 500 mcg in sodium chloride 0.9 % 250 mL (2 mcg/mL) infusion (50 mcg/hr Intravenous New Bag/Given 07/08/21 1719)  pantoprozole (PROTONIX) 80 mg /NS 100 mL infusion (8 mg/hr Intravenous New Bag/Given 07/08/21 1707)  pantoprazole (PROTONIX) injection 40 mg (has no administration in time range)  0.9 %  sodium chloride infusion (has no administration in time range)  nicotine (NICODERM CQ - dosed in mg/24 hours) patch 21 mg (has no administration in time range)  cefTRIAXone (ROCEPHIN) 1 g in sodium chloride 0.9 % 100 mL IVPB (has no administration in time range)  levETIRAcetam (KEPPRA) IVPB 1000 mg/100 mL premix (has no administration in time range)  docusate sodium (COLACE) capsule 100 mg (has no administration in time range)  polyethylene glycol (MIRALAX / GLYCOLAX) packet 17 g (has no administration in time range)   sodium chloride 0.9 % bolus 1,000 mL (1,000 mLs Intravenous Bolus 07/08/21 1418)  sodium chloride 0.9 % bolus 1,000 mL (1,000 mLs Intravenous Bolus 07/08/21 1418)  oxymetazoline (AFRIN) 0.05 % nasal spray 1 spray (1 spray Each Nare Given by Other 07/08/21 1532)  pantoprazole (PROTONIX) 80 mg /NS 100 mL IVPB (80 mg Intravenous New Bag/Given 07/08/21 1712)  ondansetron (ZOFRAN) injection 4 mg (4 mg Intravenous Given 07/08/21 1648)    ED Course  I have reviewed the triage vital signs and the nursing notes.  Pertinent labs & imaging results that were available during my care of the patient were reviewed by me and considered in my medical decision making (see chart for details).    MDM Rules/Calculators/A&P                             40 year old male with a history of seizure disorder, amphetamine use, alcohol abuse, fatty liver, thoracic ascending aortic aneurysm, history of BKA presents with concern for epistaxis and hypotension.  On arrival to the ED, his blood pressure was initially 60 systolic with heart rate in the 70s and 80s.  He did not have signs of active bleeding from his nose.  Considered possibility of hemorrhagic shock, however given less lack of tachycardia, no sign of active bleeding, consider other etiologies such as a vagal episode secondary to the bleeding, toxic/metabolic etiology.  Also considered possibility of sepsis, and  ordered blood cultures but did not order empiric antibiotics given suspect more likely other etiologies of hypertension.    He was given 2 mg of Narcan, IV fluids, with improvement of blood pressures to the 120s.  Initial i-STAT Chem-8 showed no significant electrolyte abnormalities and showed a hemoglobin of 8.8.  CBC returned showing a hemoglobin of 6.8, with prior levels around 7 and 8.  Suspect this is the more accurate value (compared to the hgb 8.8 on istat).  Discussed risks and benefits of blood transfusion and ordered 1uPRBCs.    His epistaxis  returned, provided Afrin and encouraged nasal compression, however patient did not want to continue to provide compression.  Given his presentation of hypothyroidism, anemia, and chief complaint of epistaxis, did consult Dr. Marcelline Deist of ENT who came to bedside to evaluate the patient.  Just prior to his arrival, patient's vital signs changed and he became tachycardic and hypotensive once again, concerning for hemorrhage.    Dr. Marcelline Deist placed epistat nasal catheter and inflated posterior balloon maximally and anterior balloon on left and feels his epistaxis is maximally controlled.  Following placement of the epi stat, he had an episode of large-volume hematemesis.  Unclear if this is possibly secondary to a large amount of swallowed blood from epistaxis, or possible hematemesis.  ENT Dr. Marcelline Deist feels that bleeding seen appears more consistent with a GI bleed, and while it is possible it may be secondary to his epistaxis, recommends treatment for GI bleed.  Given concern for hematemesis, as well as history of alcohol abuse and lab abnormalities concerning for liver dysfunction and varices risk, ordered octreotide and Protonix drip, Rocephin.   His blood pressures were in the [id number] systolic with heart rate in the 130s.  Initially ordered massive transfusion protocol with episode of large bleeding and continued hypotension, however after completion of 1 unit of blood, his blood pressures and HR have stabilized. Ordered 2uPRBCs, 1U platelets for thrombocytopenia and canceled the massive transfusion protocol.    Consulted ICU for admission with concern for episodes of severe hypotension, concern for possible GI bleed/variceal bleed or severe epistaxis.   Does have blood cx, urine cx, UDS pending.   Final Clinical Impression(s) / ED Diagnoses Final diagnoses:  Epistaxis  Blood loss anemia  Thrombocytopenia (HCC)  Hypotension, unspecified hypotension type    Rx / DC Orders ED Discharge Orders      None        Gareth Morgan, MD 07/08/21 [id number]    Gareth Morgan, MD 07/08/21 1742

## 2021-07-08 NOTE — ED Notes (Signed)
ENT at bedside. Pt noted to be hypotensive at systolic [id number] with a HR in the 140's. Pt vomiting blood. ED provider at bedside and verbal orders given to pressure bag blood transfusion, currently infusing, and NS fluids. Blood placed on pressure bag as well as NS liter bolus with pt improvement. Pt's mouth suctioned as well as residual blood around bilateral nares. Pt remained A&O x4 during the entirety.

## 2021-07-08 NOTE — ED Triage Notes (Signed)
Pt BIBA with c/o hypotension and epistaxis. EMS reports pt had a nose bleed that started lastnight then stopped and proceeded to start again this am around 7 am. Nose bleed appears to be coming from Left nostril. On arrival initial BP was noted to be 88 palpated. EMS reports when the pt stood, took a few steps he had a syncopal episode x2 but did not fall. Once pt was seated BP was noted to be 60/50 manual. 1000 ml NS given. BP increased to 90/60 upon arrival to the ED.   HR: 116  RR-20 CBG 154 16 G left AC

## 2021-07-08 NOTE — Consult Note (Signed)
Reason for Consult: epistaxis Referring Physician: ER MD     [name] is an 40 y.o. male.  HPI: Limited history obtained.    I was called 15 minutes ago come see patient with ongoing epistaxis.  Patient has an extensive past medical history which I have reviewed that includes alcohol abuse, seizures, stroke, mental retardation, and a myriad of other problems.  Patient reports having had a trickle of a nosebleed beginning last night.  He presented to the ER tachycardic and became hypertensive with no significant epistaxis witnessed by the staff.  I was called to come evaluate his epistaxis.  During my exam, the patient had no obvious ongoing epistaxis; however, he became more tachycardic and hypotensive and had a bout of emesis where 500 to 800 cc of bloody emesis.  The patient reports no history of GI hemorrhage.   Past Medical History:  Diagnosis Date   Alcohol abuse    Seizures ([place])    Stroke [facility])     Past Surgical History:  Procedure Laterality Date   Above knee amputation of left leg     BELOW KNEE LEG AMPUTATION     LEFT   below the knee amputation     INTRAMEDULLARY (IM) NAIL INTERTROCHANTERIC Left 05/25/2020   Procedure: INTRAMEDULLARY (IM) NAIL INTERTROCHANTRIC;  Surgeon: Shona Needles, MD;  Location: [facility];  Service: Orthopedics;  Laterality: Left;    Family History  Problem Relation Age of Onset   Heart attack Mother        Negative Hx    Social History:  reports that he has been smoking cigarettes. He has a 6.60 pack-year smoking history. He has never used smokeless tobacco. He reports current alcohol use of about 2.0 standard drinks of alcohol per week. He reports that he does not use drugs.  Allergies: No Known Allergies  Medications: I have reviewed the patient's current medications.  Results for orders placed or performed during the hospital encounter of 07/08/21 (from the past 48 hour(s))  CBC with Differential     Status: Abnormal   Collection Time:  07/08/21  1:45 PM  Result Value Ref Range   WBC 8.8 4.0 - 10.5 K/uL   RBC 2.82 (L) 4.22 - 5.81 MIL/uL   Hemoglobin 6.8 (LL) 13.0 - 17.0 g/dL    Comment: REPEATED TO VERIFY Reticulocyte Hemoglobin testing may be clinically indicated, consider ordering this additional test [id number] THIS CRITICAL RESULT HAS VERIFIED AND BEEN CALLED TO SHERELLE, RN BY LIZ BROOKS ON 07 27 2022 AT 1431, AND HAS BEEN READ BACK. CRITICAL RESULT VERIFIED    HCT 21.6 (L) 39.0 - 52.0 %   MCV 76.6 (L) 80.0 - 100.0 fL   MCH 24.1 (L) 26.0 - 34.0 pg   MCHC 31.5 30.0 - 36.0 g/dL   RDW 23.7 (H) 11.5 - 15.5 %   Platelets 64 (L) 150 - 400 K/uL    Comment: Immature Platelet Fraction may be clinically indicated, consider ordering this additional test [id number]    nRBC 0.2 0.0 - 0.2 %   Neutrophils Relative % 40 %   Neutro Abs 3.5 1.7 - 7.7 K/uL   Lymphocytes Relative 52 %   Lymphs Abs 4.5 (H) 0.7 - 4.0 K/uL   Monocytes Relative 6 %   Monocytes Absolute 0.6 0.1 - 1.0 K/uL   Eosinophils Relative 1 %   Eosinophils Absolute 0.1 0.0 - 0.5 K/uL   Basophils Relative 1 %   Basophils Absolute 0.1 0.0 - 0.1 K/uL  Immature Granulocytes 0 %   Abs Immature Granulocytes 0.03 0.00 - 0.07 K/uL    Comment: Performed at [facility], [place] [address] [place], [place] [address]  Comprehensive metabolic panel     Status: Abnormal   Collection Time: 07/08/21  1:45 PM  Result Value Ref Range   Sodium 135 135 - 145 mmol/L   Potassium 3.4 (L) 3.5 - 5.1 mmol/L   Chloride 101 98 - 111 mmol/L   CO2 22 22 - 32 mmol/L   Glucose, Bld 147 (H) 70 - 99 mg/dL    Comment: Glucose reference range applies only to samples taken after fasting for at least 8 hours.   BUN 7 6 - 20 mg/dL   Creatinine, Ser 0.98 0.61 - 1.24 mg/dL   Calcium 7.9 (L) 8.9 - 10.3 mg/dL   Total Protein 6.7 6.5 - 8.1 g/dL   Albumin 2.8 (L) 3.5 - 5.0 g/dL   AST 171 (H) 15 - 41 U/L   ALT 40 0 - 44 U/L   Alkaline Phosphatase 123 38 - 126 U/L   Total  Bilirubin 1.1 0.3 - 1.2 mg/dL   GFR, Estimated >60 >60 mL/min    Comment: (NOTE) Calculated using the CKD-EPI Creatinine Equation (2021)    Anion gap 12 5 - 15    Comment: Performed at [facility], [place] [address], [place], [place] [address]  Protime-INR     Status: None   Collection Time: 07/08/21  1:45 PM  Result Value Ref Range   Prothrombin Time 14.8 11.4 - 15.2 seconds   INR 1.2 0.8 - 1.2    Comment: (NOTE) INR goal varies based on device and disease states. Performed at [facility], [place] [address], [place], [place] [address]   APTT     Status: Abnormal   Collection Time: 07/08/21  1:45 PM  Result Value Ref Range   aPTT 37 (H) 24 - 36 seconds    Comment:        IF BASELINE aPTT IS ELEVATED, SUGGEST PATIENT RISK ASSESSMENT BE USED TO DETERMINE APPROPRIATE ANTICOAGULANT THERAPY. Performed at [facility], [place] [address] [place], [place] [address]   Type and screen [place]     Status: None (Preliminary result)   Collection Time: 07/08/21  1:50 PM  Result Value Ref Range   ABO/RH(D) O POS    Antibody Screen NEG    Sample Expiration 07/11/2021,2359    Unit Number [id number]    Blood Component Type RED CELLS,LR    Unit division 00    Status of Unit ISSUED    Transfusion Status OK TO TRANSFUSE    Crossmatch Result      Compatible Performed at [facility], [place] [address] [place],  [address]   I-stat chem 8, ED (not at [facility] or [facility])     Status: Abnormal   Collection Time: 07/08/21  2:17 PM  Result Value Ref Range   Sodium 136 135 - 145 mmol/L   Potassium 3.4 (L) 3.5 - 5.1 mmol/L   Chloride 102 98 - 111 mmol/L   BUN 3 (L) 6 - 20 mg/dL   Creatinine, Ser 1.20 0.61 - 1.24 mg/dL   Glucose, Bld 129 (H) 70 - 99 mg/dL    Comment: Glucose reference range applies only to samples taken after fasting for at least 8 hours.   Calcium, Ion 0.97 (L) 1.15 - 1.40 mmol/L    TCO2 20 (L)  22 - 32 mmol/L   Hemoglobin 8.8 (L) 13.0 - 17.0 g/dL   HCT 26.0 (L) 39.0 - 52.0 %  Lactic acid, plasma     Status: Abnormal   Collection Time: 07/08/21  2:17 PM  Result Value Ref Range   Lactic Acid, Venous 4.6 (HH) 0.5 - 1.9 mmol/L    Comment: CRITICAL RESULT CALLED TO, READ BACK BY AND VERIFIED WITH: C.Mateo Flow, RN AT 1523 ON 07.27.22 BY N.THOMPSON Performed at [place] [address], [place], [place] [address]   Prepare RBC (crossmatch)     Status: None   Collection Time: 07/08/21  3:05 PM  Result Value Ref Range   Order Confirmation      ORDER PROCESSED BY BLOOD BANK Performed at [facility], [place] [address] [place], [place] [address]   Prepare platelet pheresis     Status: None (Preliminary result)   Collection Time: 07/08/21  3:44 PM  Result Value Ref Range   Unit Number [id number]    Blood Component Type PLTP3 PSORALEN TREATED    Unit division 00    Status of Unit ALLOCATED    Transfusion Status      OK TO TRANSFUSE Performed at [place] [address], [place], [place] [address]     DG Chest Portable 1 View  Result Date: 07/08/2021 CLINICAL DATA:  Hypertension EXAM: PORTABLE CHEST 1 VIEW COMPARISON:  Chest x-ray dated May 29, 2020 FINDINGS: Patient rotation limits evaluation of the heart and mediastinum. Mild right basilar atelectasis. Lungs are otherwise clear. No large pleural effusion or evidence of pneumothorax. IMPRESSION: Right basilar atelectasis.  Lungs otherwise clear. Electronically Signed   By: Yetta Glassman MD   On: 07/08/2021 16:22    Review of Systems Blood pressure (!) 80/54, pulse (!) 110, temperature 97.9 F (36.6 C), temperature source Oral, resp. rate 20, height _0  (1.753 m), weight 99.8 kg, SpO2 100 %.  Physical Exam Patient is in some distress although he is speaking in complete sentences and breathing comfortably.  There is blood on his face but no active  bleeding from either nare upon my entry into the room.  Examination of the nasal cavity revealed no obvious anterior mass or anterior source of bleeding.  There was no blood running down the posterior oropharyngeal wall on transoral exam.  Patient's neck is normal without cervical adenopathy or mass.  Cranial nerves intact.  Chest symmetric expansions bilaterally without use of accessory muscles  Assessment/Plan: Hypotension Tachycardia History of seizure disorder History of alcohol abuse Anemia Epistaxis   I placed an epistatic catheter into the left naris, fully inflating the posterior balloon to 10 cc and the anterior balloon to 10 cc.  This appeared to stop what ever epistaxis was present.  Within minutes the patient had significant dark bloody emesis.  I would estimate 600 to 700 cc of dark blood and clot.  I was able to again examine the posterior oropharynx and noted no blood trickling down from the posterior nasopharynx.  While this certainly could be all epistaxis related, I am suspicious given his history and exam today that this is either gastric or esophageal in source.  I would recommend continuous monitoring and admission to the ICU.  Patient is already undergoing massive transfusion protocol.  If this is epistaxis related, the patient's hemoglobin should stabilize and he should have no further bleeding.  If that is the case, the epistatic catheter should be deflated in 2 days.  If bleeding returns, it should be reinflated and the patient transferred to [facility] for angiogram and embolization.  I spoke directly with GI medicine during my concern about a possible distal source for his bleeding.  He will follow and evaluate the patient.  They are admitting him to the intensive care unit for serial hemoglobins, careful observation, and possible endoscopy.  We will follow with you.  Consideration should be given to airway protection through intubation as this would allow for endoscopy  and/or increased inflation of the anterior epistatic catheter if necessary.  Marcina Millard 07/08/2021, 4:26 PM

## 2021-07-08 NOTE — H&P (Signed)
NAME:  [name], MRN:  [id number], DOB:  06/28/1981, LOS: 0 ADMISSION DATE:  07/08/2021, CONSULTATION DATE:  07/08/21 REFERRING MD:  EDP, CHIEF COMPLAINT:  epistaxis   History of Present Illness:  Mr. Bambrick is a 40 year old male with past medical history of EtOH abuse, substance abuse, seizure disorder who presented with epistaxis that began on 7/26 ,1 day before presentation.  EMS reported near syncopal event and he was initially hypotensive with a blood pressure of 60/40 which improved with IV fluid.   He denied any blood thinners at home, no aspirin or NSAIDs though does take meloxicam as needed.  He reports drinking 3 beers daily, no prior history of GI bleed.   His hemoglobin was noted at 6.8 with platelets of 64 with normal coags.  ENT was consulted and packed left nare, he then had an episode of 500-750 cc of hematemesis and became hypotensive and tachycardic so rapid transfusion was initiated with good BP response.  He was also seen by GI who thought most likely patient was swallowing blood initiating in the naris which led to episode of hematemesis.  ICU admission was recommended for close monitoring so PCCM consulted   Pertinent  Medical History  Substance abuse, EtOH abuse, seizure disorder  Significant Hospital Events: Including procedures, antibiotic start and stop dates in addition to other pertinent events   7/27 presented with epistaxis and hemorrhagic shock, improved with fluid and transfusion, admit to PCCM  Interim History / Subjective:  Repeat hemoglobin showed appropriate rise after 1 unit PRBCs, not requiring pressors or intubation.  Showing signs of agitation and anxiety  Objective   Blood pressure 124/83, pulse 83, temperature 98.2 F (36.8 C), temperature source Oral, resp. rate 15, height '5\' 9"'$  (1.753 m), weight 99.8 kg, SpO2 99 %.        Intake/Output Summary (Last 24 hours) at 07/08/2021 1737 Last data filed at 07/08/2021 1630 Gross per 24 hour  Intake  315 ml  Output --  Net 315 ml   Filed Weights   07/08/21 1414  Weight: 99.8 kg   General: Well-nourished male, anxious appearing, sitting up in bed protecting his airway HEENT: MM pink/moist, left nasal packing in place with continued oozing epistaxis sclera anicteric normocephalic Neuro: Awake and alert, appears anxious, answers questions appropriately though slowly CV: s1s2 RRR, no m/r/g PULM: Lungs clear bilaterally, no retractions, mild tachypnea GI: soft, bsx4 active  Extremities: S/p left BKA warm/dry, no edema  Skin: no rashes or lesions   Resolved Hospital Problem list     Assessment & Plan:     Epistaxis with episode of subsequent hematemesis and hemorrhagic shock, possibly secondary to thrombocytopenia POA Lactic acid 4.6 Presenting hemoglobin 6.8 Systolic blood pressure dropped in the 80s with episode of hematemesis P: -Appreciate ENT and GI consults -Epistatic catheter placed in the left nare with some continued oozing, per ENT catheter can be deflated in 2 days but if rebleeds would need transfer to [facility] for angiogram and embolization -Continue octreotide and Protonix -GI suspects hematemesis secondary to swallowed blood in the setting of epistaxis,, continue to monitor and if there is further concern for GI bleed consider proceeding with EGD, keep n.p.o. -Follow DIC panel -Serial H&H, transfuse for hemoglobin<7 and platelets<20k   Alcohol use disorder, polysubstance abuse POA Reportedly drinks approximately 3 beers per day, prior urine drug screens positive for amphetamines and cocaine P: -anxious and at risk for withdrawal and decompensation, start Precedex gtt -Thiamine, Folic Acid supplementation  Hypokalemia, hypocalcemia K3.4, calcium 7.9 POA P: -Replete calcium with 1 g, monitor K and magnesium  History of seizure disorder On Keppra at baseline P: -Continue Keppra IV and seizure precautions   Elevated transaminase POA AST  171 P: -No diagnosis of cirrhosis, but at risk given history of EtOH abuse -Monitor LFTs   Best Practice (right click and "Reselect all SmartList Selections" daily)   Diet/type: NPO DVT prophylaxis: SCD GI prophylaxis: PPI Lines: N/A Foley:  N/A Code Status:  full code Last date of multidisciplinary goals of care discussion [N/A]  Labs   CBC: Recent Labs  Lab 07/08/21 1345 07/08/21 1417 07/08/21 1626  WBC 8.8  --   --   NEUTROABS 3.5  --   --   HGB 6.8* 8.8* 7.2*  HCT 21.6* 26.0* 22.9*  MCV 76.6*  --   --   PLT 64*  --  PENDING    Basic Metabolic Panel: Recent Labs  Lab 07/08/21 1345 07/08/21 1417  NA 135 136  K 3.4* 3.4*  CL 101 102  CO2 22  --   GLUCOSE 147* 129*  BUN 7 3*  CREATININE 0.98 1.20  CALCIUM 7.9*  --    GFR: Estimated Creatinine Clearance: 94.3 mL/min (by C-G formula based on SCr of 1.2 mg/dL). Recent Labs  Lab 07/08/21 1345 07/08/21 1417  WBC 8.8  --   LATICACIDVEN  --  4.6*    Liver Function Tests: Recent Labs  Lab 07/08/21 1345  AST 171*  ALT 40  ALKPHOS 123  BILITOT 1.1  PROT 6.7  ALBUMIN 2.8*   No results for input(s): LIPASE, AMYLASE in the last 168 hours. No results for input(s): AMMONIA in the last 168 hours.  ABG    Component Value Date/Time   PHART 7.490 (H) 05/24/2020 0349   PCO2ART 32.7 05/24/2020 0349   PO2ART 156 (H) 05/24/2020 0349   HCO3 25.0 05/24/2020 0349   TCO2 20 (L) 07/08/2021 1417   ACIDBASEDEF 1.0 05/23/2020 2009   O2SAT 100.0 05/24/2020 0349     Coagulation Profile: Recent Labs  Lab 07/08/21 1345 07/08/21 1626  INR 1.2 1.2    Cardiac Enzymes: No results for input(s): CKTOTAL, CKMB, CKMBINDEX, TROPONINI in the last 168 hours.  HbA1C: No results found for: HGBA1C  CBG: No results for input(s): GLUCAP in the last 168 hours.  Review of Systems:   Review of Systems  Constitutional:  Negative for chills and fever.  HENT:  Positive for nosebleeds.   Gastrointestinal:  Positive for  nausea and vomiting.    Past Medical History:  He,  has a past medical history of Alcohol abuse, Seizures ([place]), and Stroke ([place]).   Surgical History:   Past Surgical History:  Procedure Laterality Date   Above knee amputation of left leg     BELOW KNEE LEG AMPUTATION     LEFT   below the knee amputation     INTRAMEDULLARY (IM) NAIL INTERTROCHANTERIC Left 05/25/2020   Procedure: INTRAMEDULLARY (IM) NAIL INTERTROCHANTRIC;  Surgeon: Shona Needles, MD;  Location: [facility];  Service: Orthopedics;  Laterality: Left;     Social History:   reports that he has been smoking cigarettes. He has a 6.60 pack-year smoking history. He has never used smokeless tobacco. He reports current alcohol use of about 2.0 standard drinks of alcohol per week. He reports that he does not use drugs.   Family History:  His family history includes Heart attack in his mother.   Allergies No Known  Allergies   Home Medications  Prior to Admission medications   Medication Sig Start Date End Date Taking? Authorizing Provider  cholecalciferol (VITAMIN D) 25 MCG tablet Take 2 tablets (2,000 Units total) by mouth daily. 06/08/20   Swayze, Ava, DO  folic acid (FOLVITE) 1 MG tablet Take 1 tablet (1 mg total) by mouth daily. 06/08/20   Swayze, Ava, DO  furosemide (LASIX) 20 MG tablet Take 1 tablet (20 mg total) by mouth daily. 06/07/20 06/07/21  Swayze, Ava, DO  levETIRAcetam (KEPPRA) 750 MG tablet Take 1 tablet (750 mg total) by mouth 2 (two) times daily. 06/07/20   Swayze, Ava, DO  meloxicam (MOBIC) 15 MG tablet Take 1 tablet (15 mg total) by mouth daily as needed for pain. 01/14/21   Hughie Closs, PA-C  methocarbamol (ROBAXIN) 750 MG tablet Take 1 tablet (750 mg total) by mouth 2 (two) times daily. 01/14/21   Hughie Closs, PA-C  Multiple Vitamin (MULTIVITAMIN WITH MINERALS) TABS tablet Place 1 tablet into feeding tube daily. 06/08/20   Swayze, Ava, DO  nicotine (NICODERM CQ - DOSED IN MG/24 HOURS) 21 mg/24hr patch  Place 1 patch (21 mg total) onto the skin daily. 06/08/20   Swayze, Ava, DO  potassium chloride (KLOR-CON) 10 MEQ tablet Take 1 tablet (10 mEq total) by mouth daily. 06/07/20   Swayze, Ava, DO  thiamine 100 MG tablet Place 1 tablet (100 mg total) into feeding tube daily. 06/08/20   Swayze, Ava, DO  Vitamin D, Ergocalciferol, (DRISDOL) 1.25 MG (50000 UNIT) CAPS capsule Take 1 capsule (50,000 Units total) by mouth every 7 (seven) days. 06/10/20   Swayze, Ava, DO     Critical care time:  45 minutes     CRITICAL CARE Performed by: Otilio Carpen Teyanna Thielman   Total critical care time: 45 minutes  Critical care time was exclusive of separately billable procedures and treating other patients.  Critical care was necessary to treat or prevent imminent or life-threatening deterioration.  Critical care was time spent personally by me on the following activities: development of treatment plan with patient and/or surrogate as well as nursing, discussions with consultants, evaluation of patient's response to treatment, examination of patient, obtaining history from patient or surrogate, ordering and performing treatments and interventions, ordering and review of laboratory studies, ordering and review of radiographic studies, pulse oximetry and re-evaluation of patient's condition.   Otilio Carpen Baylea Milburn, PA-C [facility] See Amion for pager If no response to pager , please call [phone] until 7pm After 7:00 pm call Elink  [phone]?[place]

## 2021-07-08 NOTE — Progress Notes (Signed)
PRBC still infusing. Lab will return to collect Q4hr hgb blood draw 1 hour post completion of PRBC infusion. Will notify lab when blood is complete.  07/08/21 2343  Vitals  BP (!) 161/92  MAP (mmHg) 115  BP Location Right Arm  BP Method Automatic  Pulse Rate (!) 54  ECG Heart Rate (!) 55  Resp 12  Oxygen Therapy  SpO2 99 %  O2 Device Room Air  ECG Monitoring  Cardiac Rhythm SB  Pain Assessment  Pain Scale 0-10  Pain Score 0  PCA/Epidural/Spinal Assessment  Respiratory Pattern Regular  MEWS Score  MEWS Temp 0  MEWS Systolic 0  MEWS Pulse 0  MEWS RR 1  MEWS LOC 0  MEWS Score 1  MEWS Score Color Green

## 2021-07-09 ENCOUNTER — Inpatient Hospital Stay (HOSPITAL_COMMUNITY): Payer: Medicaid Other

## 2021-07-09 DIAGNOSIS — R7401 Elevation of levels of liver transaminase levels: Secondary | ICD-10-CM

## 2021-07-09 DIAGNOSIS — D500 Iron deficiency anemia secondary to blood loss (chronic): Secondary | ICD-10-CM | POA: Diagnosis not present

## 2021-07-09 LAB — CBC
HCT: 19.3 % — ABNORMAL LOW (ref 39.0–52.0)
HCT: 23.9 % — ABNORMAL LOW (ref 39.0–52.0)
HCT: 25.7 % — ABNORMAL LOW (ref 39.0–52.0)
HCT: 28.3 % — ABNORMAL LOW (ref 39.0–52.0)
HCT: 27.4 % — ABNORMAL LOW (ref 39.0–52.0)
Hemoglobin: 6 g/dL — CL (ref 13.0–17.0)
Hemoglobin: 7.5 g/dL — ABNORMAL LOW (ref 13.0–17.0)
Hemoglobin: 8.3 g/dL — ABNORMAL LOW (ref 13.0–17.0)
Hemoglobin: 8.5 g/dL — ABNORMAL LOW (ref 13.0–17.0)
Hemoglobin: 8.6 g/dL — ABNORMAL LOW (ref 13.0–17.0)
MCH: 24.4 pg — ABNORMAL LOW (ref 26.0–34.0)
MCH: 25.3 pg — ABNORMAL LOW (ref 26.0–34.0)
MCH: 25.8 pg — ABNORMAL LOW (ref 26.0–34.0)
MCH: 25.9 pg — ABNORMAL LOW (ref 26.0–34.0)
MCH: 25.8 pg — ABNORMAL LOW (ref 26.0–34.0)
MCHC: 31.1 g/dL (ref 30.0–36.0)
MCHC: 31.4 g/dL (ref 30.0–36.0)
MCHC: 32.3 g/dL (ref 30.0–36.0)
MCHC: 30 g/dL (ref 30.0–36.0)
MCHC: 31.4 g/dL (ref 30.0–36.0)
MCV: 78.5 fL — ABNORMAL LOW (ref 80.0–100.0)
MCV: 80.7 fL (ref 80.0–100.0)
MCV: 79.8 fL — ABNORMAL LOW (ref 80.0–100.0)
MCV: 86.3 fL (ref 80.0–100.0)
MCV: 82.3 fL (ref 80.0–100.0)
Platelets: 120 10*3/uL — ABNORMAL LOW (ref 150–400)
Platelets: 50 10*3/uL — ABNORMAL LOW (ref 150–400)
Platelets: 49 10*3/uL — ABNORMAL LOW (ref 150–400)
Platelets: 48 10*3/uL — ABNORMAL LOW (ref 150–400)
Platelets: 47 10*3/uL — ABNORMAL LOW (ref 150–400)
RBC: 2.46 MIL/uL — ABNORMAL LOW (ref 4.22–5.81)
RBC: 2.96 MIL/uL — ABNORMAL LOW (ref 4.22–5.81)
RBC: 3.22 MIL/uL — ABNORMAL LOW (ref 4.22–5.81)
RBC: 3.28 MIL/uL — ABNORMAL LOW (ref 4.22–5.81)
RBC: 3.33 MIL/uL — ABNORMAL LOW (ref 4.22–5.81)
RDW: 19.3 % — ABNORMAL HIGH (ref 11.5–15.5)
RDW: 21.3 % — ABNORMAL HIGH (ref 11.5–15.5)
RDW: 20.7 % — ABNORMAL HIGH (ref 11.5–15.5)
RDW: 21.9 % — ABNORMAL HIGH (ref 11.5–15.5)
RDW: 21.1 % — ABNORMAL HIGH (ref 11.5–15.5)
WBC: 5.3 10*3/uL (ref 4.0–10.5)
WBC: 6 10*3/uL (ref 4.0–10.5)
WBC: 6.8 10*3/uL (ref 4.0–10.5)
WBC: 7.4 10*3/uL (ref 4.0–10.5)
WBC: 8.2 10*3/uL (ref 4.0–10.5)
nRBC: 0 % (ref 0.0–0.2)
nRBC: 0.7 % — ABNORMAL HIGH (ref 0.0–0.2)
nRBC: 0.6 % — ABNORMAL HIGH (ref 0.0–0.2)
nRBC: 0.7 % — ABNORMAL HIGH (ref 0.0–0.2)
nRBC: 0.7 % — ABNORMAL HIGH (ref 0.0–0.2)

## 2021-07-09 LAB — COMPREHENSIVE METABOLIC PANEL
ALT: 33 U/L (ref 0–44)
AST: 112 U/L — ABNORMAL HIGH (ref 15–41)
Albumin: 2.8 g/dL — ABNORMAL LOW (ref 3.5–5.0)
Alkaline Phosphatase: 110 U/L (ref 38–126)
Anion gap: 11 (ref 5–15)
BUN: <5 mg/dL — ABNORMAL LOW (ref 6–20)
CO2: 20 mmol/L — ABNORMAL LOW (ref 22–32)
Calcium: 8 mg/dL — ABNORMAL LOW (ref 8.9–10.3)
Chloride: 106 mmol/L (ref 98–111)
Creatinine, Ser: 0.82 mg/dL (ref 0.61–1.24)
GFR, Estimated: >60 mL/min (ref >60)
Glucose, Bld: 81 mg/dL (ref 70–99)
Potassium: 3.9 mmol/L (ref 3.5–5.1)
Sodium: 137 mmol/L (ref 135–145)
Total Bilirubin: 1.3 mg/dL — ABNORMAL HIGH (ref 0.3–1.2)
Total Protein: 6.5 g/dL (ref 6.5–8.1)

## 2021-07-09 LAB — SARS CORONAVIRUS 2 (TAT 6-24 HRS): SARS Coronavirus 2: NEGATIVE

## 2021-07-09 LAB — PREPARE PLATELET PHERESIS: Unit division: 0

## 2021-07-09 LAB — VITAMIN B12: Vitamin B-12: 491 pg/mL (ref 180–914)

## 2021-07-09 LAB — BPAM PLATELET PHERESIS
Blood Product Expiration Date: 202207312359
ISSUE DATE / TIME: 202207272044
Unit Type and Rh: 5100

## 2021-07-09 LAB — LACTATE DEHYDROGENASE: LDH: 154 U/L (ref 98–192)

## 2021-07-09 LAB — HIV ANTIBODY (ROUTINE TESTING W REFLEX): HIV Screen 4th Generation wRfx: NONREACTIVE

## 2021-07-09 LAB — PHOSPHORUS: Phosphorus: 4.7 mg/dL — ABNORMAL HIGH (ref 2.5–4.6)

## 2021-07-09 LAB — PATHOLOGIST SMEAR REVIEW

## 2021-07-09 LAB — MAGNESIUM: Magnesium: 1.6 mg/dL — ABNORMAL LOW (ref 1.7–2.4)

## 2021-07-09 MED ORDER — NICOTINE 21 MG/24HR TD PT24
21.0000 mg | MEDICATED_PATCH | Freq: Every day | TRANSDERMAL | Status: DC
  Administered 2021-07-09 – 2021-07-12 (×4): 21 mg via TRANSDERMAL
  Filled 2021-07-09 (×4): qty 1

## 2021-07-09 MED ORDER — PANTOPRAZOLE SODIUM 40 MG IV SOLR
40.0000 mg | Freq: Two times a day (BID) | INTRAVENOUS | Status: DC
  Administered 2021-07-10 – 2021-07-12 (×5): 40 mg via INTRAVENOUS
  Filled 2021-07-09 (×6): qty 40

## 2021-07-09 MED ORDER — LORAZEPAM 2 MG/ML IJ SOLN
1.0000 mg | INTRAMUSCULAR | Status: DC | PRN
  Administered 2021-07-09 – 2021-07-10 (×2): 1 mg via INTRAVENOUS
  Filled 2021-07-09 (×4): qty 1

## 2021-07-09 MED ORDER — DEXMEDETOMIDINE HCL IN NACL 200 MCG/50ML IV SOLN
0.1000 ug/kg/h | INTRAVENOUS | Status: DC
  Administered 2021-07-09: 1 ug/kg/h via INTRAVENOUS
  Administered 2021-07-09: 0.3 ug/kg/h via INTRAVENOUS
  Administered 2021-07-10: 1.2 ug/kg/h via INTRAVENOUS
  Administered 2021-07-10: 0.9 ug/kg/h via INTRAVENOUS
  Administered 2021-07-10: 0.5 ug/kg/h via INTRAVENOUS
  Administered 2021-07-10: 0.7 ug/kg/h via INTRAVENOUS
  Administered 2021-07-10: 0.5 ug/kg/h via INTRAVENOUS
  Administered 2021-07-10: 1.2 ug/kg/h via INTRAVENOUS
  Administered 2021-07-10: 0.8 ug/kg/h via INTRAVENOUS
  Administered 2021-07-10: 1.2 ug/kg/h via INTRAVENOUS
  Administered 2021-07-11: 0.3 ug/kg/h via INTRAVENOUS
  Administered 2021-07-11: 0.7 ug/kg/h via INTRAVENOUS
  Administered 2021-07-12: 0.6 ug/kg/h via INTRAVENOUS
  Filled 2021-07-09 (×15): qty 50

## 2021-07-09 MED ORDER — ORAL CARE MOUTH RINSE
15.0000 mL | Freq: Two times a day (BID) | OROMUCOSAL | Status: DC
  Administered 2021-07-09 – 2021-07-12 (×6): 15 mL via OROMUCOSAL

## 2021-07-09 MED ORDER — FOOD THICKENER (SIMPLYTHICK)
1.0000 | ORAL | Status: DC | PRN
  Filled 2021-07-09: qty 1

## 2021-07-09 MED ORDER — MAGNESIUM SULFATE 4 GM/100ML IV SOLN
4.0000 g | Freq: Once | INTRAVENOUS | Status: AC
  Administered 2021-07-09: 4 g via INTRAVENOUS
  Filled 2021-07-09: qty 100

## 2021-07-09 MED ORDER — HYDRALAZINE HCL 20 MG/ML IJ SOLN
10.0000 mg | INTRAMUSCULAR | Status: DC | PRN
  Administered 2021-07-09 – 2021-07-10 (×2): 10 mg via INTRAVENOUS
  Filled 2021-07-09 (×2): qty 1

## 2021-07-09 MED ORDER — FUROSEMIDE 10 MG/ML IJ SOLN
20.0000 mg | Freq: Once | INTRAMUSCULAR | Status: AC
  Administered 2021-07-09: 20 mg via INTRAVENOUS
  Filled 2021-07-09: qty 2

## 2021-07-09 NOTE — Progress Notes (Signed)
This nurse noticed patient becoming more suspicious, paranoid, and anxious. Patient refused his 2200 medications. This nurse noticed his IV in R-AC needed to be redressed, patient refused.  After nurse left room bed alarm went off, upon reentry patient was standing at side of bed attempting to put his prosthetic leg and jeans on. Patient also removed his R-AC IV. This nurse and charge got patient back into bed, but patient continued to argue that he didn't need help.   Patient's anxiety, aggression, and withdrawal continued to grow, patient refused ativan, so this nurse titrated up patients precedex as ordered.    During same encounter patient asked nurse to remove knifes from his room because he is suicidal and homicidal. This was witnessed by charge.   When asked about self harm the patient denied the desire to harm himself and/or end his life, patient stated, "Why would I be suicidal I have everything I need, I have two sister and four children".   E-link notified of event.    This nurse will continue to monitor patient, I recommend a psychiatric consult.

## 2021-07-09 NOTE — Progress Notes (Signed)
[facility] Progress Note Patient Name: [name] DOB: 07-12-1981 MRN: [id number]   Date of Service  07/09/2021  HPI/Events of Note  Hypertension - BP = 182/95. Creatinine = 1.2. Has received several units of blood products.   eICU Interventions  Plan: Lasix 20 mg IV X 1 now.  Hydralazine 10 mg IV Q 4 hours PRN SBP >170 or DBP > 100.     Intervention Category Major Interventions: Hypertension - evaluation and management  Arjun Hard Eugene 07/09/2021, 2:38 AM

## 2021-07-09 NOTE — Progress Notes (Signed)
GI had added a clear liquid diet for pt. When RN gave him ice water at bedside, pt choked, and spit up water through his nose and mouth. Pt reported to RN swallowing difficulty.   Pt is still awaiting SLP consult at this time. Pt will remain NPO for now, Dr. Chase Caller notified of dysphagia. This RN will continue to provide pt with swabs to alleviate dryness.

## 2021-07-09 NOTE — Progress Notes (Signed)
Subjective: No further epistaxis. No hematemesis.  Objective: Vital signs in last 24 hours: Temp:  [97.6 F (36.4 C)-98.3 F (36.8 C)] 97.6 F (36.4 C) (07/28 0730) Pulse Rate:  [49-123] 83 (07/28 0900) Resp:  [12-27] 17 (07/28 0900) BP: (80-182)/(53-111) 122/79 (07/28 0900) SpO2:  [95 %-100 %] 100 % (07/28 0900) Weight:  [87.7 kg-99.8 kg] 87.7 kg (07/28 0500) Weight change:  Last BM Date:  (PTA)  PE: GEN:  Chronically ill-appearing, older-appearing than stated age, NAD HEENT:  rhinorocket in left nare ABD:  Soft, non-tender  Lab Results: CBC    Component Value Date/Time   WBC 6.0 07/09/2021 0255   RBC 2.96 (L) 07/09/2021 0255   HGB 7.5 (L) 07/09/2021 0255   HCT 23.9 (L) 07/09/2021 0255   PLT 50 (L) 07/09/2021 0255   MCV 80.7 07/09/2021 0255   MCH 25.3 (L) 07/09/2021 0255   MCHC 31.4 07/09/2021 0255   RDW 21.3 (H) 07/09/2021 0255   LYMPHSABS 4.5 (H) 07/08/2021 1345   MONOABS 0.6 07/08/2021 1345   EOSABS 0.1 07/08/2021 1345   BASOSABS 0.1 07/08/2021 1345  CMP     Component Value Date/Time   NA 137 07/09/2021 0255   K 3.9 07/09/2021 0255   CL 106 07/09/2021 0255   CO2 20 (L) 07/09/2021 0255   GLUCOSE 81 07/09/2021 0255   BUN <5 (L) 07/09/2021 0255   CREATININE 0.82 07/09/2021 0255   CALCIUM 8.0 (L) 07/09/2021 0255   PROT 6.5 07/09/2021 0255   ALBUMIN 2.8 (L) 07/09/2021 0255   AST 112 (H) 07/09/2021 0255   ALT 33 07/09/2021 0255   ALKPHOS 110 07/09/2021 0255   BILITOT 1.3 (H) 07/09/2021 0255   GFRNONAA >60 07/09/2021 0255   GFRAA >60 06/07/2020 0650   Assessment:   Epistaxis, resolved after rhinorocket.  Patient denies any prior history of GI bleeding, but reports history of problems with nose bleeds for 10+ years.  Hematemesis, suspect vomiting of swallowed blood from #1 above.  No further bleeding since placement of rhinorocket. Chronic anemia and thrombocytopenia. Alcohol abuse.   Elevated liver enzymes.  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  Plan:   Stop PPI gtt (change to po formulation, and continue indefinitely upon discharge).  Stop octreotide gtt.  Clear liquid diet, advance as tolerated. Alcohol cessation needed.  No plans for GI endoscopy.  Management of epistaxis per ENT and primary team.  When/if patient has rebleeding after removal/deflation of rhinorocket, that would seem to strongly suggest nasal source of bleeding that would need to be further investigated. 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 [facility] will sign-off; please call with questions; thank you for the consultation.   Landry Dyke 07/09/2021, 10:35 AM   Cell [phone] If no answer or after 5 PM call [phone]

## 2021-07-09 NOTE — Progress Notes (Addendum)
NAME:  [name], MRN:  [id number], DOB:  03/04/1981, LOS: 0 ADMISSION DATE:  07/08/2021, CONSULTATION DATE:  07/08/21 REFERRING MD:  EDP, CHIEF COMPLAINT:  epistaxis    History of Present Illness:  Mr. [name] is a 40 year old male with past medical history of EtOH abuse, substance abuse, seizure disorder who presented with epistaxis that began on 7/26 ,1 day before presentation.  EMS reported near syncopal event and he was initially hypotensive with a blood pressure of 60/40 which improved with IV fluid.   He denied any blood thinners at home, no aspirin or NSAIDs though does take meloxicam as needed.  He reports drinking 3 beers daily, no prior history of GI bleed.   His hemoglobin was noted at 6.8 with platelets of 64 with normal coags.  ENT was consulted and packed left nare, he then had an episode of 500-750 cc of hematemesis and became hypotensive and tachycardic so rapid transfusion was initiated with good BP response.  He was also seen by GI who thought most likely patient was swallowing blood initiating in the naris which led to episode of hematemesis.  ICU admission was recommended for close monitoring so PCCM consulted     Pertinent  Medical History  Substance abuse, EtOH abuse, seizure disorder   Significant Hospital Events: Including procedures, antibiotic start and stop dates in addition to other pertinent events   7/27 presented with epistaxis and hemorrhagic shock, improved with fluid and transfusion, admit to [facility] 7/28 No further epistaxis post-admission and nasal packing   Interim History / Subjective:   Stable and hypertensive overnight, received 2 units PRBC's and one unit platelets since admission with Hgb 7.5 and platelets 50.  States he is feeling slightly "shaky", last drink morning of 7/27 reports he has gone through withdrawal in the past.   Objective   Blood pressure 124/83, pulse 83, temperature 98.2 F (36.8 C), temperature source Oral, resp. rate 15,  height '5\' 9"'$  (1.753 m), weight 99.8 kg, SpO2 99 %.    >         Intake/Output Summary (Last 24 hours) at 07/08/2021 1737 Last data filed at 07/08/2021 1630    Gross per 24 hour  Intake 315 ml  Output --  Net 315 ml       Filed Weights    07/08/21 1414  Weight: 99.8 kg    General:  well-nourished M, sleeping but easily arousable and in no distress HEENT: MM pink/moist, L nare packing in place, no oozing or bleeding, sclera anicteric, pupils equal Neuro: Sleepy, but easily arousable, oriented, no noted tremors CV: s1s2 , no m/r/g PULM: Clear bilaterally on room air without rhonchi or wheezing, no distress GI: soft, bsx4 active  Extremities: warm/dry, s/p left BKA, no edema  Skin: no rashes or lesions      Resolved Hospital Problem list       Assessment & Plan:        Epistaxis with episode of subsequent hematemesis and hemorrhagic shock, ABLA POA, in the setting of thrombocytopenia Improving Lactic acid 4.6 Presenting hemoglobin 6.8 Systolic blood pressure dropped in the 80s with episode of hematemesis, concern for concurrent GI bleed so gastroenterology was consulted P: -Hgb improved to 7.5 this morning after 2 units PRBCs -No further hematemesis or epistaxis post left nare packing -Appreciate ENT and GI consults -Epistatic catheter placed in the left nare with some continued oozing, per ENT catheter can be deflated in 2 days but if rebleeds would need transfer to  [facility] for angiogram and embolization -Continue octreotide and Protonix, can likely DC without further signs of GI bleed, but defer to gastroenterology -lactic acid improved -Serial H&H, transfuse for hemoglobin<7 and platelets<20k    Thrombocytopenia Acute, POA One prior episode note 05/2020 with platelet counts in 80k's then return to normal limits P: -INR, PT and Fibrinogen WNL this AM on DIC panel -consider MAHA or TTP -check peripheral smear, haptoglobin and LDH -HIV negative  -Possibly  secondary to ETOH, no evidence of active infection at this time    Alcohol use disorder, polysubstance abuse POA Reportedly drinks approximately 3 beers per day, prior urine drug screens positive for amphetamines and cocaine P: -anxious and at risk for withdrawal and decompensation, continue Precedex gtt and prn Ativan -Thiamine, Folic Acid supplementation     Hypokalemia, hypocalcemia, hypomagnesemia  POA P: -Continue to monitor and replete   History of seizure disorder On Keppra at baseline P: -Continue Keppra IV and seizure precautions     Elevated transaminase POA AST 171 initially P: -AST improved this AM, though bilirubin increased slightly     HFrEF Echo in 2021 with mildly depressed EF of 45-50% Discharged on Lasix '20mg'$  after last admission, pt affirms that he is still taking -Hypertensive post-transfusion likely secondary to volume, home Lasix resumed   Best Practice (right click and "Reselect all SmartList Selections" daily)    Diet/type: NPO, obtain speech eval DVT prophylaxis: SCD GI prophylaxis: PPI Lines: N/A Foley:  N/A Code Status:  full code Last date of multidisciplinary goals of care discussion [N/A]   Labs  CBC: Last Labs        Recent Labs  Lab 07/08/21 1345 07/08/21 1417 07/08/21 1626  WBC 8.8  --  --  NEUTROABS 3.5  --  --  HGB 6.8* 8.8* 7.2*  HCT 21.6* 26.0* 22.9*  MCV 76.6*  --  --  PLT 64*  -- PENDING        Basic Metabolic Panel: Last Labs       Recent Labs  Lab 07/08/21 1345 07/08/21 1417  NA 135 136  K 3.4* 3.4*  CL 101 102  CO2 22  --  GLUCOSE 147* 129*  BUN 7 3*  CREATININE 0.98 1.20  CALCIUM 7.9*  --      GFR: Estimated Creatinine Clearance: 94.3 mL/min (by C-G formula based on SCr of 1.2 mg/dL). Last Labs       Recent Labs  Lab 07/08/21 1345 07/08/21 1417  WBC 8.8  --  LATICACIDVEN  -- 4.6*        Liver Function Tests: Last Labs      Recent Labs  Lab 07/08/21 1345  AST 171*  ALT 40   ALKPHOS 123  BILITOT 1.1  PROT 6.7  ALBUMIN 2.8*      Last Labs   No results for input(s): LIPASE, AMYLASE in the last 168 hours.   Last Labs   No results for input(s): AMMONIA in the last 168 hours.     ABG Labs (Brief)          Component Value Date/Time    PHART 7.490 (H) 05/24/2020 0349    PCO2ART 32.7 05/24/2020 0349    PO2ART 156 (H) 05/24/2020 0349    HCO3 25.0 05/24/2020 0349    TCO2 20 (L) 07/08/2021 1417    ACIDBASEDEF 1.0 05/23/2020 2009    O2SAT 100.0 05/24/2020 0349        Coagulation Profile: Last Labs  Recent Labs  Lab 07/08/21 1345 07/08/21 1626  INR 1.2 1.2        Cardiac Enzymes: Last Labs   No results for input(s): CKTOTAL, CKMB, CKMBINDEX, TROPONINI in the last 168 hours.     HbA1C: Last Labs  No results found for: HGBA1C     CBG: Last Labs   No results for input(s): GLUCAP in the last 168 hours.     Review of Systems:   Review of Systems Constitutional:  Negative for chills and fever. HENT:  Positive for nosebleeds.   Gastrointestinal:  Positive for nausea and vomiting.      Past Medical History:  He,  has a past medical history of Alcohol abuse, Seizures ([place]), and Stroke ([place]).    Surgical History:         Past Surgical History:  Procedure Laterality Date   Above knee amputation of left leg       BELOW KNEE LEG AMPUTATION        LEFT   below the knee amputation       INTRAMEDULLARY (IM) NAIL INTERTROCHANTERIC Left 05/25/2020    Procedure: INTRAMEDULLARY (IM) NAIL INTERTROCHANTRIC;  Surgeon: Shona Needles, MD;  Location: [place];  Service: Orthopedics;  Laterality: Left;      Social History:   reports that he has been smoking cigarettes. He has a 6.60 pack-year smoking history. He has never used smokeless tobacco. He reports current alcohol use of about 2.0 standard drinks of alcohol per week. He reports that he does not use drugs.    Family History:  His family history includes Heart attack in his mother.     Allergies No Known Allergies    Home Medications         Prior to Admission medications  Medication Sig Start Date End Date Taking? Authorizing Provider  cholecalciferol (VITAMIN D) 25 MCG tablet Take 2 tablets (2,000 Units total) by mouth daily. 06/08/20     Swayze, Ava, DO  folic acid (FOLVITE) 1 MG tablet Take 1 tablet (1 mg total) by mouth daily. 06/08/20     Swayze, Ava, DO  furosemide (LASIX) 20 MG tablet Take 1 tablet (20 mg total) by mouth daily. 06/07/20 06/07/21   Swayze, Ava, DO  levETIRAcetam (KEPPRA) 750 MG tablet Take 1 tablet (750 mg total) by mouth 2 (two) times daily. 06/07/20     Swayze, Ava, DO  meloxicam (MOBIC) 15 MG tablet Take 1 tablet (15 mg total) by mouth daily as needed for pain. 01/14/21     Hughie Closs, PA-C  methocarbamol (ROBAXIN) 750 MG tablet Take 1 tablet (750 mg total) by mouth 2 (two) times daily. 01/14/21     Hughie Closs, PA-C  Multiple Vitamin (MULTIVITAMIN WITH MINERALS) TABS tablet Place 1 tablet into feeding tube daily. 06/08/20     Swayze, Ava, DO  nicotine (NICODERM CQ - DOSED IN MG/24 HOURS) 21 mg/24hr patch Place 1 patch (21 mg total) onto the skin daily. 06/08/20     Swayze, Ava, DO  potassium chloride (KLOR-CON) 10 MEQ tablet Take 1 tablet (10 mEq total) by mouth daily. 06/07/20     Swayze, Ava, DO  thiamine 100 MG tablet Place 1 tablet (100 mg total) into feeding tube daily. 06/08/20     Swayze, Ava, DO  Vitamin D, Ergocalciferol, (DRISDOL) 1.25 MG (50000 UNIT) CAPS capsule Take 1 capsule (50,000 Units total) by mouth every 7 (seven) days. 06/10/20     Swayze, Ava, DO  Critical care time:   35 minutes      CRITICAL CARE Performed by: Otilio Carpen Aoife Bold   Total critical care time: 35 minutes  Critical care time was exclusive of separately billable procedures and treating other patients.  Critical care was necessary to treat or prevent imminent or life-threatening deterioration.  Critical care was time spent personally by me on the  following activities: development of treatment plan with patient and/or surrogate as well as nursing, discussions with consultants, evaluation of patient's response to treatment, examination of patient, obtaining history from patient or surrogate, ordering and performing treatments and interventions, ordering and review of laboratory studies, ordering and review of radiographic studies, pulse oximetry and re-evaluation of patient's condition.   Otilio Carpen Codi Folkerts, PA-C [facility] See Amion for pager If no response to pager , please call [phone] until 7pm After 7:00 pm call Elink  [phone]?[place]
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
## 2021-07-09 NOTE — Progress Notes (Signed)
Notified by Lab of a critical Hgb of 6.0. At time of lab draw it was instructed for blood not to be drawn because patient was receiving a unit of blood.   E-link notified, repeat H&H ordered for 0200, one hour after completion of blood transfusion.

## 2021-07-09 NOTE — Progress Notes (Signed)
[facility] ADULT ICU REPLACEMENT PROTOCOL   The patient does apply for the [facility] Adult ICU Electrolyte Replacment Protocol based on the criteria listed below:   1.Exclusion criteria: TCTS patients, ECMO patients and Hypothermia Protocol, and   Dialysis patients 2. Is GFR >/= 30 ml/min? Yes.    Patient's GFR today is >60 3. Is SCr </= 2? Yes.   Patient's SCr is 0.82 mg/dL 4. Did SCr increase >/= 0.5 in 24 hours? Yes.   5.Pt's weight >40kg  No. 6. Abnormal electrolyte  Mg 1.6  7. Electrolytes replaced per protocol   Christeen Douglas 07/09/2021 6:21 AM

## 2021-07-09 NOTE — TOC Initial Note (Signed)
Transition of Care [facility]) - Initial/Assessment Note    Patient Details  Name: [name] MRN: [id number] Date of Birth: [birth year]  Transition of Care [facility]) CM/SW Contact:    Leeroy Cha, RN Phone Number: 07/09/2021, 7:57 AM  Clinical Narrative:                 S: Date of admit 07/08/2021 with LOS 0 for today 07/08/2021 : Geronimo Running is likely just epistaxis (thought ENT eval negative but having florid epistaxis) with subsequent hemataemsis that GI thinks is suspicisou for vomiting of posterior epistaxis. S/P shock that respnded to PRBC. Has rhino rocket in place. Known BKA and substance abuse including etoh. Not a known cirrhotic.   O: Blood pressure 114/81, pulse 76, temperature 98.2 F (36.8 C), temperature source Oral, resp. rate 14, height '5\' 9"'$  (1.753 m), weight 99.8 kg, SpO2 98 %.     Somewhat diaphoretic Restless BKA CTA Has left rhinorocket with some blood via left nostril that looks ffresh TOC PLAN OF CARE: Lives by himself, should be able to return to home with self care.  Expected Discharge Plan: Home/Self Care Barriers to Discharge: Continued Medical Work up   Patient Goals and CMS Choice Patient states their goals for this hospitalization and ongoing recovery are:: to go home CMS Medicare.gov Compare Post Acute Care list provided to:: Patient    Expected Discharge Plan and Services Expected Discharge Plan: Home/Self Care   Discharge Planning Services: CM Consult   Living arrangements for the past 2 months: Single Family Home                                      Prior Living Arrangements/Services Living arrangements for the past 2 months: Single Family Home Lives with:: Self   Do you feel safe going back to the place where you live?: Yes            Criminal Activity/Legal Involvement Pertinent to Current Situation/Hospitalization: No - Comment as needed  Activities of Daily Living Home Assistive Devices/Equipment: Other  (Comment) (left leg prosthesis) ADL Screening (condition at time of admission) Patient's cognitive ability adequate to safely complete daily activities?: Yes Is the patient deaf or have difficulty hearing?: No Does the patient have difficulty seeing, even when wearing glasses/contacts?: No Does the patient have difficulty concentrating, remembering, or making decisions?: No Patient able to express need for assistance with ADLs?: Yes Does the patient have difficulty dressing or bathing?: No Independently performs ADLs?: Yes (appropriate for developmental age) Does the patient have difficulty walking or climbing stairs?: No Weakness of Legs: Both Weakness of Arms/Hands: Both  Permission Sought/Granted                  Emotional Assessment Appearance:: Appears stated age     Orientation: : Oriented to Self, Oriented to Place, Oriented to  Time, Oriented to Situation Alcohol / Substance Use: Not Applicable Psych Involvement: No (comment)  Admission diagnosis:  Epistaxis [R04.0] Blood loss anemia [D50.0] Thrombocytopenia (HCC) [D69.6] Hypotension, unspecified hypotension type [I95.9] Patient Active Problem List   Diagnosis Date Noted   Epistaxis 07/08/2021   Hematemesis with nausea    Posterior epistaxis    Blood loss anemia    Hemorrhagic shock ([place])    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   Fever 05/30/2020   Positive blood culture 05/30/2020   Sinusitis 05/30/2020   Delirium 05/30/2020   Other specified  anemias 05/30/2020   Encephalopathy acute    Acute hypoxemic respiratory failure (HCC)    Elective surgery    S/p left hip fracture    Respiratory failure ([place])    Seizure ([place]) 05/23/2020   Sepsis ([place]) 06/05/2016   Thrombocytopenia ([place]) 06/05/2016   Alcohol abuse with intoxication ([place]) 06/05/2016   Fatty liver 06/05/2016   Asthma 06/05/2016   Fall at home 05/03/2012   postural orthostatic tachycardia 05/03/2012   Hypokalemia 05/03/2012   Steatosis of liver  05/03/2012   Gallstones 05/03/2012   Thoracic ascending aortic aneurysm ([place]) 05/03/2012   History of mental retardation 05/03/2012   Tobacco abuse 05/03/2012   Syncope 04/27/2012   Alcohol abuse 04/27/2012   Hx of BKA, left ([place]) 04/27/2012   PCP:  Patient, No Pcp Per (Inactive) Pharmacy:   [facility] [place], [place] - [address] AT [place] [address] Phone: [phone] Fax: [phone]  [id number] - [place], [place] [address] AT [address] [place] [address] Phone: [phone] Fax: [phone]     Social Determinants of Health ([place]) Interventions    Readmission Risk Interventions No flowsheet data found.

## 2021-07-10 DIAGNOSIS — I10 Essential (primary) hypertension: Secondary | ICD-10-CM

## 2021-07-10 DIAGNOSIS — D696 Thrombocytopenia, unspecified: Secondary | ICD-10-CM

## 2021-07-10 DIAGNOSIS — D500 Iron deficiency anemia secondary to blood loss (chronic): Secondary | ICD-10-CM | POA: Diagnosis not present

## 2021-07-10 DIAGNOSIS — R7401 Elevation of levels of liver transaminase levels: Secondary | ICD-10-CM

## 2021-07-10 DIAGNOSIS — F10231 Alcohol dependence with withdrawal delirium: Secondary | ICD-10-CM

## 2021-07-10 DIAGNOSIS — F10931 Alcohol use, unspecified with withdrawal delirium: Secondary | ICD-10-CM

## 2021-07-10 LAB — MAGNESIUM: Magnesium: 2 mg/dL (ref 1.7–2.4)

## 2021-07-10 LAB — COMPREHENSIVE METABOLIC PANEL
ALT: 28 U/L (ref 0–44)
AST: 62 U/L — ABNORMAL HIGH (ref 15–41)
Albumin: 3.2 g/dL — ABNORMAL LOW (ref 3.5–5.0)
Alkaline Phosphatase: 113 U/L (ref 38–126)
Anion gap: 10 (ref 5–15)
BUN: 11 mg/dL (ref 6–20)
CO2: 26 mmol/L (ref 22–32)
Calcium: 8.8 mg/dL — ABNORMAL LOW (ref 8.9–10.3)
Chloride: 98 mmol/L (ref 98–111)
Creatinine, Ser: 0.75 mg/dL (ref 0.61–1.24)
GFR, Estimated: >60 mL/min (ref >60)
Glucose, Bld: 149 mg/dL — ABNORMAL HIGH (ref 70–99)
Potassium: 3.6 mmol/L (ref 3.5–5.1)
Sodium: 134 mmol/L — ABNORMAL LOW (ref 135–145)
Total Bilirubin: 1.6 mg/dL — ABNORMAL HIGH (ref 0.3–1.2)
Total Protein: 7 g/dL (ref 6.5–8.1)

## 2021-07-10 LAB — CBC
HCT: 25.1 % — ABNORMAL LOW (ref 39.0–52.0)
HCT: 25.1 % — ABNORMAL LOW (ref 39.0–52.0)
HCT: 25.3 % — ABNORMAL LOW (ref 39.0–52.0)
Hemoglobin: 7.9 g/dL — ABNORMAL LOW (ref 13.0–17.0)
Hemoglobin: 8.1 g/dL — ABNORMAL LOW (ref 13.0–17.0)
Hemoglobin: 8.1 g/dL — ABNORMAL LOW (ref 13.0–17.0)
MCH: 25 pg — ABNORMAL LOW (ref 26.0–34.0)
MCH: 25.8 pg — ABNORMAL LOW (ref 26.0–34.0)
MCH: 25.8 pg — ABNORMAL LOW (ref 26.0–34.0)
MCHC: 31.5 g/dL (ref 30.0–36.0)
MCHC: 32 g/dL (ref 30.0–36.0)
MCHC: 32.3 g/dL (ref 30.0–36.0)
MCV: 79.4 fL — ABNORMAL LOW (ref 80.0–100.0)
MCV: 79.9 fL — ABNORMAL LOW (ref 80.0–100.0)
MCV: 80.6 fL (ref 80.0–100.0)
Platelets: 44 10*3/uL — ABNORMAL LOW (ref 150–400)
Platelets: 45 10*3/uL — ABNORMAL LOW (ref 150–400)
Platelets: 47 10*3/uL — ABNORMAL LOW (ref 150–400)
RBC: 3.14 MIL/uL — ABNORMAL LOW (ref 4.22–5.81)
RBC: 3.14 MIL/uL — ABNORMAL LOW (ref 4.22–5.81)
RBC: 3.16 MIL/uL — ABNORMAL LOW (ref 4.22–5.81)
RDW: 20.6 % — ABNORMAL HIGH (ref 11.5–15.5)
RDW: 21.1 % — ABNORMAL HIGH (ref 11.5–15.5)
RDW: 21.1 % — ABNORMAL HIGH (ref 11.5–15.5)
WBC: 8.1 10*3/uL (ref 4.0–10.5)
WBC: 8.7 10*3/uL (ref 4.0–10.5)
WBC: 8.9 10*3/uL (ref 4.0–10.5)
nRBC: 0.6 % — ABNORMAL HIGH (ref 0.0–0.2)
nRBC: 0.9 % — ABNORMAL HIGH (ref 0.0–0.2)
nRBC: 1 % — ABNORMAL HIGH (ref 0.0–0.2)

## 2021-07-10 LAB — PROTIME-INR
INR: 1.1 (ref 0.8–1.2)
Prothrombin Time: 13.8 seconds (ref 11.4–15.2)

## 2021-07-10 LAB — RAPID URINE DRUG SCREEN, HOSP PERFORMED
Amphetamines: NOT DETECTED
Barbiturates: NOT DETECTED
Benzodiazepines: POSITIVE — AB
Cocaine: NOT DETECTED
Opiates: NOT DETECTED
Tetrahydrocannabinol: NOT DETECTED

## 2021-07-10 LAB — DIC (DISSEMINATED INTRAVASCULAR COAGULATION)PANEL
D-Dimer, Quant: 0.75 ug/mL-FEU — ABNORMAL HIGH (ref 0.00–0.50)
Fibrinogen: 302 mg/dL (ref 210–475)
INR: 1.1 (ref 0.8–1.2)
Platelets: 45 10*3/uL — ABNORMAL LOW (ref 150–400)
Prothrombin Time: 14.5 seconds (ref 11.4–15.2)
Smear Review: NONE SEEN
aPTT: 38 seconds — ABNORMAL HIGH (ref 24–36)

## 2021-07-10 LAB — IRON AND TIBC
Iron: 76 ug/dL (ref 45–182)
Saturation Ratios: 17 % — ABNORMAL LOW (ref 17.9–39.5)
TIBC: 456 ug/dL — ABNORMAL HIGH (ref 250–450)
UIBC: 380 ug/dL

## 2021-07-10 LAB — AMMONIA: Ammonia: 50 umol/L — ABNORMAL HIGH (ref 9–35)

## 2021-07-10 LAB — APTT: aPTT: 38 seconds — ABNORMAL HIGH (ref 24–36)

## 2021-07-10 LAB — LACTIC ACID, PLASMA: Lactic Acid, Venous: 1.1 mmol/L (ref 0.5–1.9)

## 2021-07-10 LAB — URINE CULTURE: Culture: NO GROWTH

## 2021-07-10 LAB — FERRITIN: Ferritin: 33 ng/mL (ref 24–336)

## 2021-07-10 LAB — HAPTOGLOBIN: Haptoglobin: 109 mg/dL (ref 23–355)

## 2021-07-10 MED ORDER — POTASSIUM CHLORIDE 10 MEQ/100ML IV SOLN
10.0000 meq | INTRAVENOUS | Status: AC
Start: 2021-07-10 — End: 2021-07-10
  Administered 2021-07-10 (×4): 10 meq via INTRAVENOUS
  Filled 2021-07-10 (×4): qty 100

## 2021-07-10 MED ORDER — AMLODIPINE BESYLATE 5 MG PO TABS
5.0000 mg | ORAL_TABLET | Freq: Every day | ORAL | Status: DC
  Administered 2021-07-11: 5 mg via ORAL
  Filled 2021-07-10: qty 1

## 2021-07-10 MED ORDER — LORAZEPAM 2 MG/ML IJ SOLN
1.0000 mg | INTRAMUSCULAR | Status: DC | PRN
  Administered 2021-07-10 (×2): 1 mg via INTRAVENOUS
  Filled 2021-07-10 (×2): qty 1

## 2021-07-10 MED ORDER — POTASSIUM CHLORIDE CRYS ER 20 MEQ PO TBCR: 40.0000 meq | EXTENDED_RELEASE_TABLET | Freq: Once | ORAL | Status: DC

## 2021-07-10 MED ORDER — LORAZEPAM 2 MG/ML IJ SOLN
1.0000 mg | INTRAMUSCULAR | Status: DC | PRN
  Administered 2021-07-10 – 2021-07-12 (×4): 2 mg via INTRAVENOUS
  Filled 2021-07-10 (×4): qty 1

## 2021-07-10 MED ORDER — HALOPERIDOL LACTATE 5 MG/ML IJ SOLN: 5.0000 mg | Freq: Four times a day (QID) | INTRAMUSCULAR | Status: DC | PRN

## 2021-07-10 NOTE — Progress Notes (Signed)
Patient became severely agitated and aggressive towards Air cabin crew. When this nurse entered the patients room, patient had pulled out his left forearm IV and was bleeding.   This nurse stopped his bleeding, soft wrist restraints were ordered.   Security was called and wrist restraints were put on.   Will continue to monitor patient

## 2021-07-10 NOTE — Progress Notes (Signed)
[place] Progress Note Patient Name: [name] DOB: 1981/02/17 MRN: [id number]   Date of Service  07/10/2021  HPI/Events of Note  Patient had another episode of severe agitation. Nursing request for 1:1 safety sitter.  eICU Interventions  Plan: 1:1 Air cabin crew.        Zonnie Landen Cornelia Copa 07/10/2021, 12:42 AM

## 2021-07-10 NOTE — Progress Notes (Signed)
SLP Cancellation Note  Patient Details Name: [name] MRN: [id number] DOB: [birth year]   Cancelled treatment:       Reason Eval/Treat Not Completed: Other (comment) (Pt not alert, had agitation last night and received medication - on CIWA protocol, SLP spoke to RN, RN advised she would notify SLP if pt becomes alert enough for po intake. Thanks.)  Kathleen Lime, MS [facility] SLP Acute Rehab Services Office [phone] Pager [phone]  Macario Golds 07/10/2021, 11:12 AM

## 2021-07-10 NOTE — Progress Notes (Signed)
[place] Progress Note Patient Name: [name] DOB: [birth year] MRN: [id number]   Date of Service  07/10/2021  HPI/Events of Note  Nursing now says that his QTc interval = 0.544 seconds. He has now received his Haldol dose yet.   eICU Interventions  Plan: D/C Haldol IV. Increase Ativan to 1 mg IV Q 2 hours PRN severe agitation,     Intervention Category Major Interventions: Delirium, psychosis, severe agitation - evaluation and management  Hildreth Robart Eugene 07/10/2021, 12:11 AM

## 2021-07-10 NOTE — Progress Notes (Signed)
[name]: [name] DOB: 03-24-1981 MRN: [id number]   Date of Service  07/10/2021  HPI/Events of Note  Severe Agitation - Nursing request for bilateral soft wrist restraints.   eICU Interventions  Will order soft bilateral wrist restraints x 8 hours.     Intervention Category Major Interventions: Delirium, psychosis, severe agitation - evaluation and management  [name] 07/10/2021, 12:49 AM

## 2021-07-10 NOTE — TOC Progression Note (Signed)
Transition of Care [facility]) - Progression Note    Patient Details  Name: [name] MRN: [id number] Date of Birth: May 10, 1981  Transition of Care [facility]) CM/SW Contact  Leeroy Cha, RN Phone Number: 07/10/2021, 7:26 AM  Clinical Narrative:    Patient is agitated and in soft wrist restraints with 1:1 sitter.  Withdrawal symptoms present. Hgb 7.9, platelets 45, nrbc-1.0   Expected Discharge Plan: Home/Self Care Barriers to Discharge: Continued Medical Work up  Expected Discharge Plan and Services Expected Discharge Plan: Home/Self Care   Discharge Planning Services: CM Consult   Living arrangements for the past 2 months: Single Family Home                                       Social Determinants of Health (SDOH) Interventions    Readmission Risk Interventions No flowsheet data found.

## 2021-07-10 NOTE — Progress Notes (Signed)
Patient's bedside monitor was alarming 'Afib' rhythm. Upon assessment, and EKG, patient was in SB with 'sinus arrhythmia'. This RN will continue to carefully monitor pt.

## 2021-07-10 NOTE — Progress Notes (Signed)
Spoke with sister and updated about patients behaviors overnight and that he is now in bilateral soft wrist restraints.   Sister said he went through a similar withdraws last year.

## 2021-07-10 NOTE — Progress Notes (Signed)
Patient's NT obtained the ordered EKG, and was given a critical test result: 'Long QTC'. This RN reviewed EKG and QTC is 540, NSR. Mickel Baas Gleason, critical care PA notified by this RN. Awaiting further orders at this time. RN will continue to carefully monitor pt.

## 2021-07-10 NOTE — Progress Notes (Signed)
NAME:  Prakash Markum, MRN:  [id number], DOB:  Oct 11, 1981, LOS: 0 ADMISSION DATE:  07/08/2021, CONSULTATION DATE:  07/08/21 REFERRING MD:  EDP, CHIEF COMPLAINT:  epistaxis    History of Present Illness:  Mr. Digges is a 40 year old male with past medical history of EtOH abuse, substance abuse, seizure disorder who presented with epistaxis that began on 7/26 ,1 day before presentation.  EMS reported near syncopal event and he was initially hypotensive with a blood pressure of 60/40 which improved with IV fluid.   He denied any blood thinners at home, no aspirin or NSAIDs though does take meloxicam as needed.  He reports drinking 3 beers daily, no prior history of GI bleed.   His hemoglobin was noted at 6.8 with platelets of 64 with normal coags.  ENT was consulted and packed left nare, he then had an episode of 500-750 cc of hematemesis and became hypotensive and tachycardic so rapid transfusion was initiated with good BP response.  He was also seen by GI who thought most likely patient was swallowing blood initiating in the nares which led to episode of hematemesis.  ICU admission was recommended for close monitoring so PCCM consulted     Pertinent  Medical History  Substance abuse, EtOH abuse, seizure disorder   Significant Hospital Events: Including procedures, antibiotic start and stop dates in addition to other pertinent events   7/27 presented with epistaxis and hemorrhagic shock, improved with fluid and transfusion, admit to [facility] 7/28 No further epistaxis post-admission and nasal packing 7/29 severe agitation overnight, add full CIWA protocol, remains on Precedex, somewhat improved this morning.  No further bleeding or transfusion requirement   Interim History / Subjective:   No further bleeding, has not required further transfusions.  Issues alcohol withdrawal at this point.  He remains on Precedex, add Ativan and CIWA protocol  Objective   Blood pressure 124/83, pulse 83,  temperature 98.2 F (36.8 C), temperature source Oral, resp. rate 15, height '5\' 9"'$  (1.753 m), weight 99.8 kg, SpO2 99 %.    >         Intake/Output Summary (Last 24 hours) at 07/08/2021 1737 Last data filed at 07/08/2021 1630    Gross per 24 hour  Intake 315 ml  Output --  Net 315 ml       Filed Weights    07/08/21 1414  Weight: 99.8 kg   General: Acutely ill-appearing male, awake but confused HEENT: MM pink/moist, left nare packing in place without bleeding Neuro: Sleeping, easily arousable and protecting his airway and Precedex.  He mumbles answers, not oriented, no severe tremors noted CV: s1s2 RRR, no m/r/g PULM: Clear bilaterally on room air without rhonchi wheezing or distress GI: soft, bsx4 active  Extremities: warm/dry, no edema s/p L LE amputation Skin: no rashes or lesions       Resolved Hospital Problem list       Assessment & Plan:        Epistaxis with episode of subsequent hematemesis and hemorrhagic shock, ABLA POA, in the setting of thrombocytopenia Improving Lactic acid 4.6 Presenting hemoglobin 6.8 Systolic blood pressure dropped in the 80s with episode of hematemesis, concern for concurrent GI bleed so gastroenterology was consulted P: -Stable since admission, hemoglobin improved >8 after 2 units PRBCs, no further epistaxis or hematemesis -No pressor requirement -Epistatic catheter placed in the left nare with some continued oozing, per ENT catheter can be deflated in 2 days but if rebleeds would need transfer to [facility]  for angiogram and embolization, defer deflation to ENT -Octreotide and Protonix gtt. discontinued per GI, no plans for scope -Continue PPI daily -Continue to follow hemoglobin and platelets and transfuse as needed      Thrombocytopenia Acute, POA One prior episode note 05/2020 with platelet counts in 80k's then return to normal limits P: -Suspect may be related to EtOH and cirrhosis -INR, PT and Fibrinogen WNL and DIC  panel -No schistocytes on peripheral smear, LDH within normal limits haptoglobin pending -Macrocytic anemia on smear, iron and ferritin levels okay -HIV negative, no evidence of active infection    Alcohol use disorder, polysubstance abuse POA Reportedly drinks approximately 3 beers per day, prior urine drug screens positive for amphetamines and cocaine Developed acute DTs overnight 7/28 approximately 36-48 hours after last drink P: -DTs preclude transfer from ICU at this time, continue Precedex, add full CIWA protocol -Thiamine, Folic Acid supplementation     Hypokalemia, hypocalcemia, hypomagnesemia  POA P: -Continue to monitor and replete   History of seizure disorder On Keppra at baseline P: -Continue Keppra IV and seizure precautions     Elevated transaminase POA AST 171 initially P: -Right upper quadrant ultrasound with biliary sludge, no evidence of occult acute cholecystitis -[place] with increased hepatic echogenicity consistent with fatty infiltration or pattern of cellular disease -add ammonia level in the setting of encephalopathy, though suspect this is primarily secondary to ETOH    HFrEF Echo in 2021 with mildly depressed EF of 45-50% Discharged on Lasix '20mg'$  after last admission, pt affirms that he is still taking P: -Hypertensive post-transfusion likely secondary to volume, home Lasix resumed -Blood pressures remain high, add low-dose Norvasc   Best Practice (right click and "Reselect all SmartList Selections" daily)    Diet/type: NPO,  DVT prophylaxis: SCD GI prophylaxis: PPI Lines: N/A Foley:  N/A Code Status:  full code Last date of multidisciplinary goals of care discussion [ Sister updated via phone 7/29]   Labs  CBC: Last Labs        Recent Labs  Lab 07/08/21 1345 07/08/21 1417 07/08/21 1626  WBC 8.8  --  --  NEUTROABS 3.5  --  --  HGB 6.8* 8.8* 7.2*  HCT 21.6* 26.0* 22.9*  MCV 76.6*  --  --  PLT 64*  -- PENDING        Basic  Metabolic Panel: Last Labs       Recent Labs  Lab 07/08/21 1345 07/08/21 1417  NA 135 136  K 3.4* 3.4*  CL 101 102  CO2 22  --  GLUCOSE 147* 129*  BUN 7 3*  CREATININE 0.98 1.20  CALCIUM 7.9*  --      GFR: Estimated Creatinine Clearance: 94.3 mL/min (by C-G formula based on SCr of 1.2 mg/dL). Last Labs       Recent Labs  Lab 07/08/21 1345 07/08/21 1417  WBC 8.8  --  LATICACIDVEN  -- 4.6*        Liver Function Tests: Last Labs      Recent Labs  Lab 07/08/21 1345  AST 171*  ALT 40  ALKPHOS 123  BILITOT 1.1  PROT 6.7  ALBUMIN 2.8*      Last Labs   No results for input(s): LIPASE, AMYLASE in the last 168 hours.   Last Labs   No results for input(s): AMMONIA in the last 168 hours.     ABG Labs (Brief)          Component Value Date/Time  PHART 7.490 (H) 05/24/2020 0349    PCO2ART 32.7 05/24/2020 0349    PO2ART 156 (H) 05/24/2020 0349    HCO3 25.0 05/24/2020 0349    TCO2 20 (L) 07/08/2021 1417    ACIDBASEDEF 1.0 05/23/2020 2009    O2SAT 100.0 05/24/2020 0349        Coagulation Profile: Last Labs       Recent Labs  Lab 07/08/21 1345 07/08/21 1626  INR 1.2 1.2        Cardiac Enzymes: Last Labs   No results for input(s): CKTOTAL, CKMB, CKMBINDEX, TROPONINI in the last 168 hours.     HbA1C: Last Labs  No results found for: HGBA1C     CBG: Last Labs   No results for input(s): GLUCAP in the last 168 hours.     Review of Systems:   Review of Systems Constitutional:  Negative for chills and fever. HENT:  Positive for nosebleeds.   Gastrointestinal:  Positive for nausea and vomiting.      Past Medical History:  He,  has a past medical history of Alcohol abuse, Seizures ([place]), and Stroke ([place]).    Surgical History:         Past Surgical History:  Procedure Laterality Date   Above knee amputation of left leg       BELOW KNEE LEG AMPUTATION        LEFT   below the knee amputation       INTRAMEDULLARY (IM) NAIL  INTERTROCHANTERIC Left 05/25/2020    Procedure: INTRAMEDULLARY (IM) NAIL INTERTROCHANTRIC;  Surgeon: Shona Needles, MD;  Location: [place];  Service: Orthopedics;  Laterality: Left;      Social History:   reports that he has been smoking cigarettes. He has a 6.60 pack-year smoking history. He has never used smokeless tobacco. He reports current alcohol use of about 2.0 standard drinks of alcohol per week. He reports that he does not use drugs.    Family History:  His family history includes Heart attack in his mother.    Allergies No Known Allergies    Home Medications         Prior to Admission medications  Medication Sig Start Date End Date Taking? Authorizing Provider  cholecalciferol (VITAMIN D) 25 MCG tablet Take 2 tablets (2,000 Units total) by mouth daily. 06/08/20     Swayze, Ava, DO  folic acid (FOLVITE) 1 MG tablet Take 1 tablet (1 mg total) by mouth daily. 06/08/20     Swayze, Ava, DO  furosemide (LASIX) 20 MG tablet Take 1 tablet (20 mg total) by mouth daily. 06/07/20 06/07/21   Swayze, Ava, DO  levETIRAcetam (KEPPRA) 750 MG tablet Take 1 tablet (750 mg total) by mouth 2 (two) times daily. 06/07/20     Swayze, Ava, DO  meloxicam (MOBIC) 15 MG tablet Take 1 tablet (15 mg total) by mouth daily as needed for pain. 01/14/21     Hughie Closs, PA-C  methocarbamol (ROBAXIN) 750 MG tablet Take 1 tablet (750 mg total) by mouth 2 (two) times daily. 01/14/21     Hughie Closs, PA-C  Multiple Vitamin (MULTIVITAMIN WITH MINERALS) TABS tablet Place 1 tablet into feeding tube daily. 06/08/20     Swayze, Ava, DO  nicotine (NICODERM CQ - DOSED IN MG/24 HOURS) 21 mg/24hr patch Place 1 patch (21 mg total) onto the skin daily. 06/08/20     Swayze, Ava, DO  potassium chloride (KLOR-CON) 10 MEQ tablet Take 1 tablet (10 mEq total) by  mouth daily. 06/07/20     Swayze, Ava, DO  thiamine 100 MG tablet Place 1 tablet (100 mg total) into feeding tube daily. 06/08/20     Swayze, Ava, DO  Vitamin D,  Ergocalciferol, (DRISDOL) 1.25 MG (50000 UNIT) CAPS capsule Take 1 capsule (50,000 Units total) by mouth every 7 (seven) days. 06/10/20     Swayze, Ava, DO      Critical care time:   33 minutes      CRITICAL CARE Performed by: Otilio Carpen Arsenio Schnorr   Total critical care time: 33 minutes  Critical care time was exclusive of separately billable procedures and treating other patients.  Critical care was necessary to treat or prevent imminent or life-threatening deterioration.  Critical care was time spent personally by me on the following activities: development of treatment plan with patient and/or surrogate as well as nursing, discussions with consultants, evaluation of patient's response to treatment, examination of patient, obtaining history from patient or surrogate, ordering and performing treatments and interventions, ordering and review of laboratory studies, ordering and review of radiographic studies, pulse oximetry and re-evaluation of patient's condition.   Otilio Carpen Trestin Vences, PA-C [facility] See Amion for pager If no response to pager , please call [phone] until 7pm After 7:00 pm call Elink  [phone]?[place]

## 2021-07-10 NOTE — Progress Notes (Signed)
[place] Progress Note Patient Name: [name] DOB: [birth year] MRN: [id number]   Date of Service  07/10/2021  HPI/Events of Note  Severe Agitation - QTc interval = 0.477 seconds.   eICU Interventions  Plan: Haldol 5 mg IV Q 6 hours PRN severe agitation.  Monitor QTc interval Q 6 hours. Notify MD if QTc interval > 0.5 seconds.      Intervention Category Major Interventions: Delirium, psychosis, severe agitation - evaluation and management  [name] 07/10/2021, 12:04 AM

## 2021-07-10 NOTE — Progress Notes (Signed)
eLink Physician-Brief Progress Note Patient Name: [name] DOB: 12-09-1981 MRN: [id number]   Date of Service  07/10/2021  HPI/Events of Note  Received request for restraints renewal. Inquiry if ativan may be given QT prolonged unable to give haloperidol Patient seen restless  eICU Interventions  Restraints renewed as at risk for pulling lines and tubes Ok to give prn Ativan as ordered     Intervention Category Minor Interventions: Agitation / anxiety - evaluation and management  Judd Lien 07/10/2021, 8:39 PM

## 2021-07-10 NOTE — Progress Notes (Signed)
Patients ekg leads came off, when this nurse went to hook patient back up to monitor patient became combative and agitated.   Security was called to the unit. Patient was placed in posey belt. Safety sitter requested.   Dr. Oletta Darter notified

## 2021-07-10 NOTE — Progress Notes (Signed)
[facility] ADULT ICU REPLACEMENT PROTOCOL   The patient does apply for the [facility] Adult ICU Electrolyte Replacment Protocol based on the criteria listed below:   1.Exclusion criteria: TCTS patients, ECMO patients and Hypothermia Protocol, and   Dialysis patients 2. Is GFR >/= 30 ml/min? Yes.    Patient's GFR today is >60 3. Is SCr </= 2? Yes.   Patient's SCr is 0.75 mg/dL 4. Did SCr increase >/= 0.5 in 24 hours? No. 5.Pt's weight >40kg  Yes.   6. Abnormal electrolyte(s): K 3.6  7. Electrolytes replaced per protocol   Christeen Douglas 07/10/2021 6:27 AM

## 2021-07-11 DIAGNOSIS — R040 Epistaxis: Secondary | ICD-10-CM | POA: Diagnosis not present

## 2021-07-11 DIAGNOSIS — R7401 Elevation of levels of liver transaminase levels: Secondary | ICD-10-CM | POA: Diagnosis not present

## 2021-07-11 DIAGNOSIS — D6489 Other specified anemias: Secondary | ICD-10-CM

## 2021-07-11 DIAGNOSIS — F10231 Alcohol dependence with withdrawal delirium: Secondary | ICD-10-CM | POA: Diagnosis not present

## 2021-07-11 LAB — CBC
HCT: 25.7 % — ABNORMAL LOW (ref 39.0–52.0)
Hemoglobin: 8.2 g/dL — ABNORMAL LOW (ref 13.0–17.0)
MCH: 26.1 pg (ref 26.0–34.0)
MCHC: 31.9 g/dL (ref 30.0–36.0)
MCV: 81.8 fL (ref 80.0–100.0)
Platelets: 50 10*3/uL — ABNORMAL LOW (ref 150–400)
RBC: 3.14 MIL/uL — ABNORMAL LOW (ref 4.22–5.81)
RDW: 21.8 % — ABNORMAL HIGH (ref 11.5–15.5)
WBC: 7.6 10*3/uL (ref 4.0–10.5)
nRBC: 1.8 % — ABNORMAL HIGH (ref 0.0–0.2)

## 2021-07-11 LAB — COMPREHENSIVE METABOLIC PANEL
ALT: 23 U/L (ref 0–44)
AST: 61 U/L — ABNORMAL HIGH (ref 15–41)
Albumin: 3.1 g/dL — ABNORMAL LOW (ref 3.5–5.0)
Alkaline Phosphatase: 115 U/L (ref 38–126)
Anion gap: 9 (ref 5–15)
BUN: 8 mg/dL (ref 6–20)
CO2: 25 mmol/L (ref 22–32)
Calcium: 8.7 mg/dL — ABNORMAL LOW (ref 8.9–10.3)
Chloride: 99 mmol/L (ref 98–111)
Creatinine, Ser: 0.69 mg/dL (ref 0.61–1.24)
GFR, Estimated: >60 mL/min (ref >60)
Glucose, Bld: 98 mg/dL (ref 70–99)
Potassium: 3.2 mmol/L — ABNORMAL LOW (ref 3.5–5.1)
Sodium: 133 mmol/L — ABNORMAL LOW (ref 135–145)
Total Bilirubin: 1.8 mg/dL — ABNORMAL HIGH (ref 0.3–1.2)
Total Protein: 7 g/dL (ref 6.5–8.1)

## 2021-07-11 LAB — HEMOGLOBIN AND HEMATOCRIT, BLOOD
HCT: 26 % — ABNORMAL LOW (ref 39.0–52.0)
Hemoglobin: 8.2 g/dL — ABNORMAL LOW (ref 13.0–17.0)

## 2021-07-11 LAB — MAGNESIUM: Magnesium: 1.7 mg/dL (ref 1.7–2.4)

## 2021-07-11 LAB — PHOSPHORUS
Phosphorus: 1.3 mg/dL — ABNORMAL LOW (ref 2.5–4.6)
Phosphorus: 3.1 mg/dL (ref 2.5–4.6)

## 2021-07-11 MED ORDER — THIAMINE HCL 100 MG PO TABS
100.0000 mg | ORAL_TABLET | Freq: Every day | ORAL | Status: DC
  Administered 2021-07-12: 100 mg via ORAL
  Filled 2021-07-11: qty 1

## 2021-07-11 MED ORDER — MAGNESIUM SULFATE 2 GM/50ML IV SOLN
2.0000 g | Freq: Once | INTRAVENOUS | Status: AC
  Administered 2021-07-11: 2 g via INTRAVENOUS
  Filled 2021-07-11: qty 50

## 2021-07-11 MED ORDER — FOLIC ACID 1 MG PO TABS
1.0000 mg | ORAL_TABLET | Freq: Every day | ORAL | Status: DC
  Administered 2021-07-12: 1 mg via ORAL
  Filled 2021-07-11: qty 1

## 2021-07-11 MED ORDER — ACETAMINOPHEN 325 MG PO TABS
650.0000 mg | ORAL_TABLET | Freq: Four times a day (QID) | ORAL | Status: DC | PRN
  Administered 2021-07-11 – 2021-07-12 (×3): 650 mg via ORAL
  Filled 2021-07-11 (×3): qty 2

## 2021-07-11 MED ORDER — LIP MEDEX EX OINT
TOPICAL_OINTMENT | CUTANEOUS | Status: DC | PRN
  Administered 2021-07-11: 1 via TOPICAL
  Filled 2021-07-11 (×2): qty 7

## 2021-07-11 MED ORDER — POTASSIUM PHOSPHATES 15 MMOLE/5ML IV SOLN
45.0000 mmol | Freq: Once | INTRAVENOUS | Status: AC
  Administered 2021-07-11: 45 mmol via INTRAVENOUS
  Filled 2021-07-11: qty 15

## 2021-07-11 NOTE — Progress Notes (Signed)
NAME:  [name], MRN:  [id number], DOB:  1981/09/05, LOS: 0 ADMISSION DATE:  07/08/2021, CONSULTATION DATE:  07/08/21 REFERRING MD:  EDP, CHIEF COMPLAINT:  epistaxis    History of Present Illness:  [name] is a 40 year old male with past medical history of EtOH abuse, substance abuse, seizure disorder who presented with epistaxis that began on 7/26 ,1 day before presentation.  EMS reported near syncopal event and he was initially hypotensive with a blood pressure of 60/40 which improved with IV fluid.   He denied any blood thinners at home, no aspirin or NSAIDs though does take meloxicam as needed.  He reports drinking 3 beers daily, no prior history of GI bleed.   His hemoglobin was noted at 6.8 with platelets of 64 with normal coags.  ENT was consulted and packed left nare, he then had an episode of 500-750 cc of hematemesis and became hypotensive and tachycardic so rapid transfusion was initiated with good BP response.  He was also seen by GI who thought most likely patient was swallowing blood initiating in the nares which led to episode of hematemesis.  ICU admission was recommended for close monitoring so PCCM consulted     Pertinent  Medical History  Substance abuse, EtOH abuse, seizure disorder   Significant Hospital Events: Including procedures, antibiotic start and stop dates in addition to other pertinent events   7/27 presented with epistaxis and hemorrhagic shock, improved with fluid and transfusion, admit to [facility] 7/28 No further epistaxis post-admission and nasal packing per ENT 7/29 severe agitation overnight, add full CIWA protocol, remains on Precedex, somewhat improved this morning.  No further bleeding or transfusion requirement 7/30 wean down precedex s agitation    Scheduled Meds:  amLODipine  5 mg Oral Daily   Chlorhexidine Gluconate Cloth  6 each Topical Q0600   [START ON [id number] folic acid  1 mg Oral Daily   mouth rinse  15 mL Mouth Rinse BID    nicotine  21 mg Transdermal Daily   pantoprazole (PROTONIX) IV  40 mg Intravenous Q12H   [START ON 07/12/2021] thiamine  100 mg Oral Daily   Continuous Infusions:  sodium chloride     sodium chloride     sodium chloride     dexmedetomidine (PRECEDEX) IV infusion Stopped (07/11/21 1002)   levETIRAcetam Stopped (07/11/21 1003)   potassium PHOSPHATE IVPB (in mmol) 86 mL/hr at 07/11/21 1015   PRN Meds:.docusate sodium, food thickener, hydrALAZINE, lip balm, LORazepam, polyethylene glycol  Interim History / Subjective:   Much calmer per nursing / still a disoriented to day of week    Objective   Blood pressure 125/79, pulse (!) 56, temperature 98.1 F (36.7 C), temperature source Axillary, resp. rate 14, height '5\' 9"'$  (1.753 m), weight 88.6 kg, SpO2 100 %.    Intake/Output Summary (Last 24 hours) at 07/11/2021 1108 Last data filed at 07/11/2021 1015 Gross per 24 hour  Intake 1327.15 ml  Output 1070 ml  Net 257.15 ml     Tmax  98.1  General appearance:   middle aged bm nad   At Rest 02 sats  100% on RA  No jvd/L nares packing in place, no active oozing  Oropharynx clear,  mucosa nl Neck supple Lungs with a few scattered exp > insp rhonchi bilaterally RRR no s3 or or sign murmur Abd obese with nl  excursion  Extr  s/P L BKA  Neuro  Sensorium oriented to person/ place/ situation but note date or day  of week,  no apparent motor deficits        Resolved Hospital Problem list       Assessment & Plan:        Epistaxis with episode of subsequent hematemesis and hemorrhagic shock, ABLA in the setting of thrombocytopenia Lab Results  Component Value Date   PLT 50 (L) 07/11/2021   PLT 45 (L) 07/10/2021   PLT 47 (L) 07/10/2021   PLT 45 (L) 07/10/2021       Lab Results  Component Value Date   HGB 8.2 (L) 07/11/2021   HGB 7.9 (L) 07/10/2021   HGB 8.1 (L) 07/10/2021    Trends improivng  Presenting hemoglobin 6.8  P: -  improved >8 after 2 units PRBCs, no further  epistaxis or hematemesis -No pressor requirement -Epistatic catheter placed in the left nare with some continued oozing, per ENT catheter can be deflated in 2 days but if rebleeds would need transfer to [facility] for angiogram and embolization, defer deflation to ENT -Continue PPI daily -Continue to follow hemoglobin and platelets and transfuse as needed      Thrombocytopenia Acute, present on admit. One prior episode note 05/2020 with platelet counts in 80k's then return to normal limits P: - ? related to EtOH and cirrhosis -INR, PT and Fibrinogen WNL   -No schistocytes on peripheral smear, LDH within normal limits haptoglobin still pending  -Macrocytic anemia on smear, iron and ferritin levels okay -HIV negative, no evidence of active infection    Alcohol use disorder, polysubstance abuse POA Reportedly drinks approximately 3 beers per day, prior urine drug screens positive for amphetamines and cocaine Developed acute DTs overnight 7/28 approximately 36-48 hours after last drink - ammonia level 50 7/29  P: - weaning off precedex successfully so far today  -Thiamine, Folic Acid supplementation     Hypokalemia, hypocalcemia, hypomagnesemia  POA P: -Continue to monitor and replete   History of seizure disorder On Keppra at baseline P: -Continue Keppra IV and seizure precautions     Elevated transaminase POA AST 171 initially trending down nicely typical of etoh injury  - ammonia level 50 7/29  P: -Right upper quadrant ultrasound with biliary sludge, no evidence of occult acute cholecystitis -[place] with increased hepatic echogenicity consistent with fatty infiltration or pattern of cellular disease     HFrEF Echo in 2021 with mildly depressed EF of 45-50% Discharged on Lasix '20mg'$  after last admission, pt affirms that he is still taking P: -Hypertensive post-transfusion likely secondary to volume, home Lasix resumed - may add clonidine if bp up again as taper off  precedex but not needed for now   Best Practice (right click and "Reselect all SmartList Selections" daily)    Diet/type: clear liquids DVT prophylaxis: SCD GI prophylaxis: PPI Lines: N/A Foley:  N/A Code Status:  full code     Labs  CBC: Last Labs        Recent Labs  Lab 07/08/21 1345 07/08/21 1417 07/08/21 1626  WBC 8.8  --  --  NEUTROABS 3.5  --  --  HGB 6.8* 8.8* 7.2*  HCT 21.6* 26.0* 22.9*  MCV 76.6*  --  --  PLT 64*  -- PENDING        Basic Metabolic Panel: Last Labs       Recent Labs  Lab 07/08/21 1345 07/08/21 1417  NA 135 136  K 3.4* 3.4*  CL 101 102  CO2 22  --  GLUCOSE 147* 129*  BUN 7 3*  CREATININE 0.98 1.20  CALCIUM 7.9*  --      GFR: Estimated Creatinine Clearance: 94.3 mL/min (by C-G formula based on SCr of 1.2 mg/dL). Last Labs       Recent Labs  Lab 07/08/21 1345 07/08/21 1417  WBC 8.8  --  LATICACIDVEN  -- 4.6*        Liver Function Tests: Last Labs      Recent Labs  Lab 07/08/21 1345  AST 171*  ALT 40  ALKPHOS 123  BILITOT 1.1  PROT 6.7  ALBUMIN 2.8*      Last Labs   No results for input(s): LIPASE, AMYLASE in the last 168 hours.   Last Labs   No results for input(s): AMMONIA in the last 168 hours.     ABG Labs (Brief)          Component Value Date/Time    PHART 7.490 (H) 05/24/2020 0349    PCO2ART 32.7 05/24/2020 0349    PO2ART 156 (H) 05/24/2020 0349    HCO3 25.0 05/24/2020 0349    TCO2 20 (L) 07/08/2021 1417    ACIDBASEDEF 1.0 05/23/2020 2009    O2SAT 100.0 05/24/2020 0349        Coagulation Profile: Last Labs       Recent Labs  Lab 07/08/21 1345 07/08/21 1626  INR 1.2 1.2        Cardiac Enzymes: Last Labs   No results for input(s): CKTOTAL, CKMB, CKMBINDEX, TROPONINI in the last 168 hours.     HbA1C: Last Labs  No results found for: HGBA1C     CBG: Last Labs   No results for input(s): GLUCAP in the last 168 hours.      Christinia Gully, MD Pulmonary and [place] [phone]   After 7:00 pm call Elink  [phone]

## 2021-07-11 NOTE — Progress Notes (Signed)
Subjective: Some bloody drainage out the left nare earlier this afternoon that has stopped.  He did not have any bleeding into his throat.  Objective: Vital signs in last 24 hours: Temp:  [97.6 F (36.4 C)-98.8 F (37.1 C)] 97.6 F (36.4 C) (07/30 1653) Pulse Rate:  [51-112] 103 (07/30 1630) Resp:  [11-27] 19 (07/30 1630) BP: (96-171)/(64-122) 102/76 (07/30 1600) SpO2:  [97 %-100 %] 98 % (07/30 1630) Weight:  [88.6 kg] 88.6 kg (07/30 0500) Wt Readings from Last 1 Encounters:  07/11/21 88.6 kg    Intake/Output from previous day: 07/29 0701 - 07/30 0700 In: 1272.7 [I.V.:604.5; IV Piggyback:668.2] Out: 1370 [Urine:1370] Intake/Output this shift: Total I/O In: 895.8 [P.O.:240; I.V.:37.9; IV Piggyback:617.9] Out: -   General appearance: alert, cooperative, and no distress Nose: Balloon pack in left nasal passage, no bleeding  Recent Labs    07/10/21 0438 07/11/21 0230 07/11/21 1410  WBC 8.7  8.9 7.6  --   HGB 7.9*  8.1* 8.2* 8.2*  HCT 25.1*  25.3* 25.7* 26.0*  PLT 45*  45*  47* 50*  --     Recent Labs    07/10/21 0438 07/11/21 0230  NA 134* 133*  K 3.6 3.2*  CL 98 99  CO2 26 25  GLUCOSE 149* 98  BUN 11 8  CREATININE 0.75 0.69  CALCIUM 8.8* 8.7*    Medications: I have reviewed the patient's current medications.  Assessment/Plan: Left epistaxis  Some bleeding today self-limited.  Pack in good position.  Hemoglobin stable.  Plan to leave pack in place until Monday.   LOS: 3 days   Melida Quitter 07/11/2021, 5:08 PM

## 2021-07-11 NOTE — Progress Notes (Signed)
[facility] ADULT ICU REPLACEMENT PROTOCOL   The patient does apply for the [facility] Adult ICU Electrolyte Replacment Protocol based on the criteria listed below:   1.Exclusion criteria: TCTS patients, ECMO patients and Hypothermia Protocol, and   Dialysis patients 2. Is GFR >/= 30 ml/min? Yes.    Patient's GFR today is >60 3. Is SCr </= 2? Yes.   Patient's SCr is 0.69 mg/dL 4. Did SCr increase >/= 0.5 in 24 hours? No. 5.Pt's weight >40kg  Yes.   6. Abnormal electrolyte(s): K+ 3.2, mag 1.7, phos 1.3  7. Electrolytes replaced per protocol 8.  Call MD STAT for K+ </= 2.5, Phos </= 1, or Mag </= 1 Physician:  n/a   Darlys Gales 07/11/2021 4:48 AM

## 2021-07-11 NOTE — Progress Notes (Signed)
Patient having new onset bleeding around rhino rocket in left nare, tachycardia, and confusion. Dr. Redmond Baseman, ENT notified. Per ENT, put gauze around nare until further notice. Stat H and H ordered, will continue to closely monitor.
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
## 2021-07-12 DIAGNOSIS — F10231 Alcohol dependence with withdrawal delirium: Secondary | ICD-10-CM | POA: Diagnosis not present

## 2021-07-12 DIAGNOSIS — R040 Epistaxis: Secondary | ICD-10-CM | POA: Diagnosis not present

## 2021-07-12 DIAGNOSIS — R7401 Elevation of levels of liver transaminase levels: Secondary | ICD-10-CM | POA: Diagnosis not present

## 2021-07-12 DIAGNOSIS — D6489 Other specified anemias: Secondary | ICD-10-CM | POA: Diagnosis not present

## 2021-07-12 LAB — CBC
HCT: 25.2 % — ABNORMAL LOW (ref 39.0–52.0)
Hemoglobin: 7.9 g/dL — ABNORMAL LOW (ref 13.0–17.0)
MCH: 26.3 pg (ref 26.0–34.0)
MCHC: 31.3 g/dL (ref 30.0–36.0)
MCV: 84 fL (ref 80.0–100.0)
Platelets: 62 10*3/uL — ABNORMAL LOW (ref 150–400)
RBC: 3 MIL/uL — ABNORMAL LOW (ref 4.22–5.81)
RDW: 22.3 % — ABNORMAL HIGH (ref 11.5–15.5)
WBC: 8.6 10*3/uL (ref 4.0–10.5)
nRBC: 0.9 % — ABNORMAL HIGH (ref 0.0–0.2)

## 2021-07-12 LAB — BASIC METABOLIC PANEL
Anion gap: 7 (ref 5–15)
BUN: 7 mg/dL (ref 6–20)
CO2: 25 mmol/L (ref 22–32)
Calcium: 8.4 mg/dL — ABNORMAL LOW (ref 8.9–10.3)
Chloride: 98 mmol/L (ref 98–111)
Creatinine, Ser: 0.91 mg/dL (ref 0.61–1.24)
GFR, Estimated: >60 mL/min (ref >60)
Glucose, Bld: 107 mg/dL — ABNORMAL HIGH (ref 70–99)
Potassium: 3.1 mmol/L — ABNORMAL LOW (ref 3.5–5.1)
Sodium: 130 mmol/L — ABNORMAL LOW (ref 135–145)

## 2021-07-12 LAB — TYPE AND SCREEN
ABO/RH(D): O POS
Antibody Screen: NEGATIVE
Unit division: 0
Unit division: 0
Unit division: 0

## 2021-07-12 LAB — MAGNESIUM: Magnesium: 1.7 mg/dL (ref 1.7–2.4)

## 2021-07-12 LAB — BPAM RBC
Blood Product Expiration Date: 202208302359
Blood Product Expiration Date: 202208302359
Blood Product Expiration Date: 202208302359
ISSUE DATE / TIME: 202207271552
ISSUE DATE / TIME: 202207272227
Unit Type and Rh: 5100
Unit Type and Rh: 5100
Unit Type and Rh: 5100

## 2021-07-12 LAB — PHOSPHORUS: Phosphorus: 2.4 mg/dL — ABNORMAL LOW (ref 2.5–4.6)

## 2021-07-12 MED ORDER — POTASSIUM CHLORIDE CRYS ER 20 MEQ PO TBCR
20.0000 meq | EXTENDED_RELEASE_TABLET | ORAL | Status: DC
Start: 2021-07-12 — End: 2021-07-12
  Administered 2021-07-12: 20 meq via ORAL
  Filled 2021-07-12 (×2): qty 1

## 2021-07-12 MED ORDER — MAGNESIUM SULFATE 2 GM/50ML IV SOLN
2.0000 g | Freq: Once | INTRAVENOUS | Status: AC
  Administered 2021-07-12: 2 g via INTRAVENOUS
  Filled 2021-07-12: qty 50

## 2021-07-12 MED ORDER — CLONIDINE HCL 0.1 MG PO TABS
0.1000 mg | ORAL_TABLET | Freq: Three times a day (TID) | ORAL | Status: DC
  Administered 2021-07-12: 0.1 mg via ORAL
  Filled 2021-07-12: qty 1

## 2021-07-12 MED ORDER — POTASSIUM CHLORIDE 10 MEQ/100ML IV SOLN
10.0000 meq | INTRAVENOUS | Status: AC
  Administered 2021-07-12 (×3): 10 meq via INTRAVENOUS
  Filled 2021-07-12 (×4): qty 100

## 2021-07-12 NOTE — Progress Notes (Signed)
Patient agitated and demands to leave AMA.   Reiterated benefits of staying hospitalized at current time and continuing with plan of care.   Reiterated risks of leaving AMA.  Patient continues to demand to leave AMA and increasing more agitated.  Patient removed IV and attempting to leave.   AMA papers filled out and patient has called a ride to leave.  He left unit without any further incidence. Charge RN and MD aware.

## 2021-07-12 NOTE — Progress Notes (Signed)
MD at bedside and explained plan of care.   Discussed benefits of staying in hospital and plan to discharge when appropriate.  Patient voiced understanding.   Immediately upon MD leaving room, patient began cursing and said "you all can't keep me here".  He began taking off medical monitoring devices.  Attempted to call calm patient.  Currently, he is laying calmly in bed.

## 2021-07-12 NOTE — Progress Notes (Signed)
Patient requesting to see MD again.  He wants to be discharged.  Educated on need to stay in hospital, explained benefits of staying vs risks of being discharged too early or AMA.  Patient willing to wait to speak with MD but wants to put on his personal clothing.   Patient given bath and assisted with putting on personal clothing over monitoring devices.

## 2021-07-12 NOTE — Progress Notes (Signed)
[facility] ADULT ICU REPLACEMENT PROTOCOL   The patient does apply for the [facility] Adult ICU Electrolyte Replacment Protocol based on the criteria listed below:   1.Exclusion criteria: TCTS patients, ECMO patients and Hypothermia Protocol, and   Dialysis patients 2. Is GFR >/= 30 ml/min? Yes.    Patient's GFR today is >60 3. Is SCr </= 2? Yes.   Patient's SCr is 0.91 mg/dL 4. Did SCr increase >/= 0.5 in 24 hours? No. 5.Pt's weight >40kg  Yes.   6. Abnormal electrolyte(s): K+ 3.1, Mag 1.7  7. Electrolytes replaced per protocol 8.  Call MD STAT for K+ </= 2.5, Phos </= 1, or Mag </= 1 Physician:  protocol  Carlisle Beers 07/12/2021 5:19 AM

## 2021-07-12 NOTE — Progress Notes (Signed)
NAME:  [name], MRN:  [id number], DOB:  20-Oct-1981, LOS: 0 ADMISSION DATE:  07/08/2021, CONSULTATION DATE:  07/08/21 REFERRING MD:  EDP, CHIEF COMPLAINT:  epistaxis    History of Present Illness:  Mr. Goeckner is a 40 year old male with past medical history of EtOH abuse, substance abuse, seizure disorder who presented with epistaxis that began on 7/26 ,1 day before presentation.  EMS reported near syncopal event and he was initially hypotensive with a blood pressure of 60/40 which improved with IV fluid.   He denied any blood thinners at home, no aspirin or NSAIDs though does take meloxicam as needed.  He reports drinking 3 beers daily, no prior history of GI bleed.   His hemoglobin was noted at 6.8 with platelets of 64 with normal coags.  ENT was consulted and packed left nare, he then had an episode of 500-750 cc of hematemesis and became hypotensive and tachycardic so rapid transfusion was initiated with good BP response.  He was also seen by GI who thought most likely patient was swallowing blood initiating in the nares which led to episode of hematemesis.  ICU admission was recommended for close monitoring so PCCM consulted     Pertinent  Medical History  Substance abuse, EtOH abuse, seizure disorder   Significant Hospital Events: Including procedures, antibiotic start and stop dates in addition to other pertinent events   7/27 presented with epistaxis and hemorrhagic shock, improved with fluid and transfusion, admit to [facility] 7/28 No further epistaxis post-admission and nasal packing per ENT 7/29 severe agitation overnight, add full CIWA protocol, remains on Precedex, somewhat improved this morning.  No further bleeding or transfusion requirement 7/30 wean down precedex >  agitation and tachycardia worse  so resumed  7/31 pt pulled out nasal tubes    Scheduled Meds:  Chlorhexidine Gluconate Cloth  6 each Topical [id number]   folic acid  1 mg Oral Daily   mouth rinse  15 mL Mouth  Rinse BID   nicotine  21 mg Transdermal Daily   pantoprazole (PROTONIX) IV  40 mg Intravenous Q12H   potassium chloride  20 mEq Oral Q4H   thiamine  100 mg Oral Daily   Continuous Infusions:  sodium chloride     sodium chloride     sodium chloride     dexmedetomidine (PRECEDEX) IV infusion 0.6 mcg/kg/hr (07/12/21 [id number])   levETIRAcetam Stopped (07/11/21 2225)   potassium chloride 10 mEq (07/12/21 0818)   PRN Meds:.acetaminophen, docusate sodium, food thickener, hydrALAZINE, lip balm, LORazepam, polyethylene glycol    Interim History / Subjective:      Sleepy bm nad, no complaints, knows day of week, [place] not name of hospital  Objective    Blood pressure 127/84, pulse 84, temperature 98.8 F (37.1 C), temperature source Oral, resp. rate 16, height '5\' 9"'$  (1.753 m), weight 85 kg, SpO2 99 %.    Intake/Output Summary (Last 24 hours) at 07/12/2021 0838 Last data filed at 07/12/2021 [id number] Gross per 24 hour  Intake 744.58 ml  Output 850 ml  Net -105.42 ml     Tmax   98.8  General appearance:    > stated age, chronically ill appearing   At Rest 02 sats  99% on RA  No jvd Old blood L nares  Neck supple Lungs with a few scattered exp > insp rhonchi bilaterally RRR no s3 or or sign murmur Abd obese with nl  excursion  Extr warm with no edema or clubbing noted Neuro  Sensorium  as above ,  no apparent motor deficits           Resolved Hospital Problem list       Assessment & Plan:        Epistaxis with episode of subsequent hematemesis and hemorrhagic shock, ABLA in the setting of thrombocytopenia Lab Results  Component Value Date   PLT 62 (L) 07/12/2021   PLT 50 (L) 07/11/2021   PLT 45 (L) 07/10/2021   PLT 47 (L) 07/10/2021   PLT 45 (L) 07/10/2021       Lab Results  Component Value Date   HGB 7.9 (L) 07/12/2021   HGB 8.2 (L) 07/11/2021   HGB 8.2 (L) 07/11/2021    Trends as above  Presenting hemoglobin=  6.8  P: -No  further epistaxis or hematemesis  overnight despite tube pulled out by pt  - No pressor requirement --Continue PPI daily -Continue to follow hemoglobin and platelets and transfuse as needed      Thrombocytopenia Acute, present on admit. One prior episode note 05/2020 with platelet counts in 80k's then return to normal limits P: - ? related to EtOH and cirrhosis -INR, PT and Fibrinogen WNL   -No schistocytes on peripheral smear, LDH within normal limits haptoglobin still pending  -Macrocytic anemia on smear, iron and ferritin levels okay -HIV negative, no evidence of active infection    Alcohol use disorder, polysubstance abuse POA Reportedly drinks approximately 3 beers per day, prior urine drug screens positive for amphetamines and cocaine Developed acute DTs overnight 7/28 approximately 36-48 hours after last drink - ammonia level = 50 7/29  P: >>> Thiamine, Folic Acid supplementation >>> Try clonidine to help get off precedex    Hypokalemia, hypocalcemia, hypomagnesemia  POA P: -Continue to monitor and replete as per protocols   History of seizure disorder On Keppra at baseline P: -Continue Keppra IV and seizure precautions     Elevated transaminase POA AST 171 initially >> trending down nicely typical of etoh injury  - ammonia level 50 7/29  P: -Right upper quadrant ultrasound with biliary sludge, no evidence of occult acute cholecystitis -[place] with increased hepatic echogenicity consistent with fatty infiltration or pattern of cellular disease     HFrEF Echo in 2021 with mildly depressed EF of 45-50% Discharged on Lasix 20 mg after last admission, pt affirms that he is still taking P: -Hypertensive post-transfusion likely secondary to volume  >>> change amlodipine to clonidine to help get precedex weaned off successfully    Best Practice (right click and "Reselect all SmartList Selections" daily)    Diet/type: clear liquids DVT prophylaxis: SCD GI prophylaxis: PPI Lines: N/A Foley:   N/A Code Status:  full code   Christinia Gully, MD Pulmonary and [place] Cell [phone]   After 7:00 pm call Elink  [phone]

## 2021-07-13 LAB — CULTURE, BLOOD (ROUTINE X 2)
Culture: NO GROWTH
Culture: NO GROWTH
Special Requests: ADEQUATE
Special Requests: ADEQUATE

## 2021-07-28 NOTE — Discharge Summary (Signed)
Physician Discharge Summary         Patient ID: Olyn Schroder MRN: [id number] DOB/AGE: [birth year] 40 y.o.  Admit date: 07/08/2021 Discharge date: 07/12/21  [facility]  Discharge Diagnoses:      Discharge summary    History of Present Illness:  Mr. Peel is a 40 year old male with past medical history of EtOH abuse, substance abuse, seizure disorder who presented with epistaxis that began on 7/26 ,1 day PTA.  EMS reported near syncopal event and he was initially hypotensive with a blood pressure of 60/40 which improved with IV fluid.   He denied any blood thinners at home, no aspirin or NSAIDs though  did  take meloxicam as needed.  He reporteddrinking 3 beers daily, no prior history of GI bleed.   His hemoglobin was noted at 6.8 with platelets of 64 with normal coags.  ENT was consulted and packed left nare, he then had an episode of 500-750 cc of hematemesis and became hypotensive and tachycardic so rapid transfusion was initiated with good BP response.  He was also seen by GI who thought most likely patient was swallowing blood initiating in the nares which led to episode of hematemesis.  ICU admission was recommended for close monitoring so PCCM consulted     Pertinent  Medical History  Substance abuse, EtOH abuse, seizure disorder   Significant Hospital Events: Including procedures, antibiotic start and stop dates in addition to other pertinent events   7/27 presented with epistaxis and hemorrhagic shock, improved with fluid and transfusion, admit to [facility] 7/28 No further epistaxis post-admission and nasal packing per ENT 7/29 severe agitation overnight, add full CIWA protocol, remains on Precedex, somewhat improved this morning.  No further bleeding or transfusion requirement 7/30 weaned  down precedex >  agitation and tachycardia worse  so resumed  7/31 pt pulled out nasal tubes  7/31  Off sedation left AMA     Labs at discharge   Lab Results  Component Value Date   CREATININE  0.91 07/12/2021   BUN 7 07/12/2021   NA 130 (L) 07/12/2021   K 3.1 (L) 07/12/2021   CL 98 07/12/2021   CO2 25 07/12/2021   Lab Results  Component Value Date   WBC 8.6 07/12/2021   HGB 7.9 (L) 07/12/2021   HCT 25.2 (L) 07/12/2021   MCV 84.0 07/12/2021   PLT 62 (L) 07/12/2021   Lab Results  Component Value Date   ALT 23 07/11/2021   AST 61 (H) 07/11/2021   ALKPHOS 115 07/11/2021   BILITOT 1.8 (H) 07/11/2021   Lab Results  Component Value Date   INR 1.1 07/10/2021   INR 1.1 07/10/2021   INR 1.1 07/08/2021       Christinia Gully, MD Pulmonary and [place] [phone]   After 7:00 pm call Elink  [phone]

## 2021-11-04 ENCOUNTER — Ambulatory Visit: Payer: Self-pay | Admitting: *Deleted

## 2021-11-04 NOTE — Telephone Encounter (Signed)
Reason for Disposition  Nursing judgment or information in reference  Answer Assessment - Initial Assessment Questions 1. REASON FOR CALL: "What is your main concern right now?"     Appt for pt 2. ONSET: "When did the *No Answer* start?"     *No Answer* 3. SEVERITY: "How bad is the *No Answer*?"     *No Answer* 4. FEVER: "Do you have a fever?"     *No Answer* 5. OTHER SYMPTOMS: "Do you have any other new symptoms?"     *No Answer* 6. TREATMENTS AND RESPONSE: "What have you done so far to try to make this better? What medicines have you used?"     *No Answer* 7. PREGNANCY: "Is there any chance you are pregnant?" "When was your last menstrual period?"     *No Answer*  Protocols used: No Guideline Available-A-AH

## 2021-11-04 NOTE — Telephone Encounter (Signed)
Dr. Chrissie Noa MD with Medicare calling to report doing a wellness check on pt. Stated pt is "Mentally retarded." States pt has been out of meds for 3 months and needs them now." States pt has appt at [facility] 11/19/21 but needs to be seen earlier for refills. Called pt, advised to call [facility] to request earlier appt for refills. [facility] is not affiliated with [employer]. Advised UC/ED for med refill if unable to be seen earlier at [facility]. Pt states he did go to UC for nose bleed (07/08/21) States they would not give refills. Pt then stated "I didn't tell you the whole story, I walked out of there."  Number to [facility] provided to pt.Advised UC/ED if needed for refills of essential meds. Pt verbalizes understanding. States he will call now.

## 2021-11-04 NOTE — Telephone Encounter (Signed)
Call transferred by agent. Caller not on line. Patient triaged by another NT Maryann.

## 2021-11-19 ENCOUNTER — Telehealth: Payer: Self-pay | Admitting: Family Medicine

## 2021-11-19 ENCOUNTER — Other Ambulatory Visit: Payer: Self-pay

## 2021-11-19 ENCOUNTER — Ambulatory Visit (INDEPENDENT_AMBULATORY_CARE_PROVIDER_SITE_OTHER): Payer: Medicaid Other | Admitting: Family Medicine

## 2021-11-19 ENCOUNTER — Encounter: Payer: Self-pay | Admitting: Family Medicine

## 2021-11-19 VITALS — BP 104/74 | HR 122 | Temp 98.1°F | Resp 16 | Wt 183.0 lb

## 2021-11-19 DIAGNOSIS — G40909 Epilepsy, unspecified, not intractable, without status epilepticus: Secondary | ICD-10-CM | POA: Diagnosis not present

## 2021-11-19 DIAGNOSIS — F341 Dysthymic disorder: Secondary | ICD-10-CM

## 2021-11-19 DIAGNOSIS — N529 Male erectile dysfunction, unspecified: Secondary | ICD-10-CM

## 2021-11-19 DIAGNOSIS — D235 Other benign neoplasm of skin of trunk: Secondary | ICD-10-CM

## 2021-11-19 DIAGNOSIS — Z7689 Persons encountering health services in other specified circumstances: Secondary | ICD-10-CM

## 2021-11-19 DIAGNOSIS — Z978 Presence of other specified devices: Secondary | ICD-10-CM

## 2021-11-19 MED ORDER — FOLIC ACID 1 MG PO TABS: 1.0000 mg | ORAL_TABLET | Freq: Every day | ORAL | 1 refills | Status: DC

## 2021-11-19 MED ORDER — DULOXETINE HCL 20 MG PO CPEP: 20.0000 mg | ORAL_CAPSULE | Freq: Every day | ORAL | 0 refills | Status: DC

## 2021-11-19 MED ORDER — LEVETIRACETAM 750 MG PO TABS: 750.0000 mg | ORAL_TABLET | Freq: Two times a day (BID) | ORAL | 1 refills | Status: DC

## 2021-11-19 MED ORDER — METHOCARBAMOL 750 MG PO TABS: 750.0000 mg | ORAL_TABLET | Freq: Two times a day (BID) | ORAL | 0 refills | Status: DC

## 2021-11-19 MED ORDER — SILDENAFIL CITRATE 100 MG PO TABS: 100.0000 mg | ORAL_TABLET | Freq: Every day | ORAL | 0 refills | Status: DC | PRN

## 2021-11-19 MED ORDER — MELOXICAM 15 MG PO TABS: 15.0000 mg | ORAL_TABLET | Freq: Every day | ORAL | 1 refills | Status: DC | PRN

## 2021-11-19 MED ORDER — TRAMADOL HCL 50 MG PO TABS: 50.0000 mg | ORAL_TABLET | Freq: Two times a day (BID) | ORAL | 0 refills | Status: DC

## 2021-11-19 NOTE — Telephone Encounter (Signed)
Pt states his viagra is $400 @ Pharmacy  [facility] [place], [address] AT [address]  [place], [place] [address]  Phone:  [phone]  Fax:  [phone]    And states Medicaid will not cover it. Pt is asking if PCP could lower the dosage so it could be covered. Thank you

## 2021-11-19 NOTE — Progress Notes (Signed)
Patient is here to est care . Patient has bump on his back and would like provider to look at

## 2021-11-23 ENCOUNTER — Other Ambulatory Visit: Payer: Self-pay | Admitting: Family Medicine

## 2021-11-23 ENCOUNTER — Encounter: Payer: Self-pay | Admitting: Family Medicine

## 2021-11-23 MED ORDER — SILDENAFIL CITRATE 50 MG PO TABS: 50.0000 mg | ORAL_TABLET | Freq: Every day | ORAL | 0 refills | Status: DC | PRN

## 2021-11-23 MED ORDER — VITAMIN D (ERGOCALCIFEROL) 1.25 MG (50000 UNIT) PO CAPS: 50000.0000 [IU] | ORAL_CAPSULE | ORAL | 0 refills | Status: DC

## 2021-11-23 NOTE — Progress Notes (Signed)
New Patient Office Visit  Subjective:  Patient ID: [name], male    DOB: 1981/07/11  Age: 40 y.o. MRN: [id number]  CC:  Chief Complaint  Patient presents with   Establish Care    HPI [name] presents for to establish care and for review of chronic med issues with med refills. Patient reports that he has a prosthetic leg and is needing some assistance as he now has a painful stump and difficulty managing with the prosthesis. Patient also reports lump on his back that is intermittently tender.   Past Medical History:  Diagnosis Date   Alcohol abuse    Seizures ([place])    Stroke [facility])     Past Surgical History:  Procedure Laterality Date   Above knee amputation of left leg     BELOW KNEE LEG AMPUTATION     LEFT   below the knee amputation     INTRAMEDULLARY (IM) NAIL INTERTROCHANTERIC Left 05/25/2020   Procedure: INTRAMEDULLARY (IM) NAIL INTERTROCHANTRIC;  Surgeon: Nathaniel Needles, MD;  Location: [place];  Service: Orthopedics;  Laterality: Left;    Family History  Problem Relation Age of Onset   Heart attack Mother        Negative Hx    Social History   Socioeconomic History   Marital status: Single    Spouse name: Not on file   Number of children: Not on file   Years of education: Not on file   Highest education level: Not on file  Occupational History   Not on file  Tobacco Use   Smoking status: Every Day    Packs/day: 0.33    Years: 20.00    Pack years: 6.60    Types: Cigarettes   Smokeless tobacco: Never  Substance and Sexual Activity   Alcohol use: Yes    Alcohol/week: 2.0 standard drinks    Types: 2 Cans of beer per week    Comment: not every day   Drug use: No   Sexual activity: Not Currently  Other Topics Concern   Not on file  Social History Narrative   ** Merged History Encounter **       Social Determinants of Health   Financial Resource Strain: Not on file  Food Insecurity: Not on file  Transportation Needs: Not on file   Physical Activity: Not on file  Stress: Not on file  Social Connections: Not on file  Intimate Partner Violence: Not on file    ROS Review of Systems  Neurological:  Negative for seizures.   Objective:   Today's Vitals: BP 104/74   Pulse (!) 122   Temp 98.1 F (36.7 C) (Oral)   Resp 16   Wt 183 lb (83 kg)   SpO2 98%   BMI 27.02 kg/m   Physical Exam Vitals and nursing note reviewed.  Constitutional:      General: He is not in acute distress. Cardiovascular:     Rate and Rhythm: Normal rate and regular rhythm.  Pulmonary:     Effort: Pulmonary effort is normal.     Breath sounds: Normal breath sounds.  Abdominal:     Palpations: Abdomen is soft.     Tenderness: There is no abdominal tenderness.  Musculoskeletal:     Comments: Left prosthetic leg noted     Left Lower Extremity: Left leg is amputated below knee.  Skin:    Comments: Large cystic lesion - solitary, soft, well circumscribed, NTTP, noted in thoracic area of back.  Neurological:     General: No focal deficit present.     Mental Status: He is alert and oriented to person, place, and time.  Psychiatric:        Mood and Affect: Mood normal.        Behavior: Behavior normal.    Assessment & Plan:   1. Dysthymia Appears stable with present management. Cymbalta refilled. monitor  2. Dermoid cyst of skin of back Referral to gen surg for further eval/mgt  3. Seizure disorder ([place]) Appears stable with present management. Meds refilled.   4. Presence of prosthesis Referral for further eval/mgt. Tramadol, robaxin,  and meloxicam prescribed.   - OT prosthetic management; Future  5. ED  Viagra 100 mg prescribed  6. Encounter to establish care     Outpatient Encounter Medications as of 11/19/2021  Medication Sig   DULoxetine (CYMBALTA) 20 MG capsule Take 1 capsule (20 mg total) by mouth daily.   sildenafil (VIAGRA) 100 MG tablet Take 1 tablet (100 mg total) by mouth daily as needed for erectile  dysfunction.   traMADol (ULTRAM) 50 MG tablet Take 1 tablet (50 mg total) by mouth 2 (two) times daily.   [DISCONTINUED] folic acid (FOLVITE) 1 MG tablet Take 1 tablet (1 mg total) by mouth daily.   [DISCONTINUED] levETIRAcetam (KEPPRA) 750 MG tablet Take 1 tablet (750 mg total) by mouth 2 (two) times daily.   [DISCONTINUED] meloxicam (MOBIC) 15 MG tablet Take 1 tablet (15 mg total) by mouth daily as needed for pain.   [DISCONTINUED] methocarbamol (ROBAXIN) 750 MG tablet Take 1 tablet (750 mg total) by mouth 2 (two) times daily. (Patient taking differently: Take 750 mg by mouth daily.)   folic acid (FOLVITE) 1 MG tablet Take 1 tablet (1 mg total) by mouth daily.   levETIRAcetam (KEPPRA) 750 MG tablet Take 1 tablet (750 mg total) by mouth 2 (two) times daily.   meloxicam (MOBIC) 15 MG tablet Take 1 tablet (15 mg total) by mouth daily as needed for pain.   methocarbamol (ROBAXIN) 750 MG tablet Take 1 tablet (750 mg total) by mouth 2 (two) times daily.   No facility-administered encounter medications on file as of 11/19/2021.    Follow-up: No follow-ups on file.   Nathaniel Sax, MD

## 2021-11-23 NOTE — Telephone Encounter (Signed)
Patient is aware of medication sent

## 2021-11-27 ENCOUNTER — Telehealth: Payer: Self-pay | Admitting: Family Medicine

## 2021-11-27 NOTE — Telephone Encounter (Signed)
Patient states that per pharmacy, all medications for ED are covered except from Viagra.  Pls advise.

## 2021-11-27 NOTE — Telephone Encounter (Signed)
Pt wants to know other options for obtaining sildenafil (VIAGRA) 50 MG tablet [[id number]]   since Medicaid won't cover the 50mg .

## 2021-11-30 NOTE — Telephone Encounter (Signed)
Patient is aware of his options to help him get this medication

## 2021-12-01 ENCOUNTER — Ambulatory Visit: Payer: Medicaid Other | Admitting: Surgery

## 2021-12-02 ENCOUNTER — Other Ambulatory Visit: Payer: Self-pay

## 2021-12-02 ENCOUNTER — Encounter (HOSPITAL_COMMUNITY): Payer: Self-pay | Admitting: Emergency Medicine

## 2021-12-02 ENCOUNTER — Ambulatory Visit (HOSPITAL_COMMUNITY)
Admission: EM | Admit: 2021-12-02 | Discharge: 2021-12-02 | Disposition: A | Discharge status: Home / Self Care | Payer: Medicaid Other | Attending: Physician Assistant | Admitting: Physician Assistant

## 2021-12-02 DIAGNOSIS — S21209A Unspecified open wound of unspecified back wall of thorax without penetration into thoracic cavity, initial encounter: Secondary | ICD-10-CM

## 2021-12-02 DIAGNOSIS — L02212 Cutaneous abscess of back [any part, except buttock]: Secondary | ICD-10-CM | POA: Diagnosis not present

## 2021-12-02 MED ORDER — MUPIROCIN 2 % EX OINT: 1.0000 "application " | TOPICAL_OINTMENT | Freq: Every day | CUTANEOUS | 0 refills | Status: DC

## 2021-12-02 MED ORDER — DOXYCYCLINE HYCLATE 100 MG PO CAPS: 100.0000 mg | ORAL_CAPSULE | Freq: Two times a day (BID) | ORAL | 0 refills | Status: DC

## 2021-12-02 NOTE — ED Provider Notes (Signed)
[facility]    CSN: [id number] Arrival date & time: 12/02/21  1433      History   Chief Complaint Chief Complaint  Patient presents with   Abscess    HPI Jahquan Klugh is a 40 y.o. male.   Patient presents today with 2-year history of cyst on his back that has recently enlarged and started draining.  Reports that it has been draining for several days but continues to be painful prompting evaluation.  Pain is rated 10 on a 0-10 pain scale, localized to affected area, described as throbbing, no aggravating leaving factors notified.  Denies any nausea, vomiting, fever.  Denies history of recurrent skin infections or MRSA.  Denies any recent antibiotics.   Past Medical History:  Diagnosis Date   Alcohol abuse    Seizures ([place])    Stroke [facility]     Patient Active Problem List   Diagnosis Date Noted   DTs (delirium tremens) (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   Fever 05/30/2020   Positive blood culture 05/30/2020   Sinusitis 05/30/2020   Delirium 05/30/2020   Other specified anemias 05/30/2020   Encephalopathy acute    Acute hypoxemic respiratory failure ([place])    Elective surgery    S/p left hip fracture    Respiratory failure ([place])    Seizure ([place]) 05/23/2020   Sepsis ([place]) 06/05/2016   Thrombocytopenia ([place]) 06/05/2016   Alcohol abuse with intoxication ([place]) 06/05/2016   Fatty liver 06/05/2016   Asthma 06/05/2016   Fall at home 05/03/2012   postural orthostatic tachycardia 05/03/2012   Hypokalemia 05/03/2012   Steatosis of liver 05/03/2012   Gallstones 05/03/2012   Thoracic ascending aortic aneurysm ([place]) 05/03/2012   History of mental retardation 05/03/2012   Tobacco abuse 05/03/2012   Syncope 04/27/2012   Alcohol abuse 04/27/2012   Hx of BKA,  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 Authorizing Provider  doxycycline (VIBRAMYCIN) 100 MG capsule Take 1 capsule (100 mg total) by mouth 2 (two) times daily. 12/02/21  Yes Lancelot Alyea, Junie Panning K, PA-C  DULoxetine (CYMBALTA) 20 MG capsule Take 1 capsule (20 mg total) by mouth daily. 11/19/21  Yes Dorna Mai, MD  levETIRAcetam (KEPPRA) 750 MG tablet Take 1 tablet (750 mg total) by mouth 2 (two) times daily. 11/19/21  Yes Dorna Mai, MD  mupirocin ointment (BACTROBAN) 2 % Apply 1 application topically daily. 12/02/21  Yes Elray Dains, Derry Skill, PA-C  folic acid (FOLVITE) 1 MG tablet Take 1 tablet (1 mg total) by mouth daily. 11/19/21   Dorna Mai, MD  meloxicam (MOBIC) 15 MG tablet Take 1 tablet (15 mg total) by mouth daily as needed for pain. 11/19/21   Dorna Mai, MD  methocarbamol (ROBAXIN) 750 MG  tablet Take 1 tablet (750 mg total) by mouth 2 (two) times daily. 11/19/21   Dorna Mai, MD  sildenafil (VIAGRA) 100 MG tablet Take 1 tablet (100 mg total) by mouth daily as needed for erectile dysfunction. 11/19/21   Dorna Mai, MD  sildenafil (VIAGRA) 50 MG tablet Take 1 tablet (50 mg total) by mouth daily as needed for erectile dysfunction. 11/23/21   Dorna Mai, MD  traMADol (ULTRAM) 50 MG tablet Take 1 tablet (50 mg total) by mouth 2 (two) times daily. 11/19/21   Dorna Mai, MD    Family History Family History  Problem Relation Age of Onset   Heart attack Mother        Negative Hx    Social History Social History   Tobacco  Use   Smoking status: Every Day    Packs/day: 0.33    Years: 20.00    Pack years: 6.60    Types: Cigarettes   Smokeless tobacco: Never  Substance Use Topics   Alcohol use: Yes    Alcohol/week: 2.0 standard drinks    Types: 2 Cans of beer per week    Comment: not every day   Drug use: No     Allergies   Patient has no known allergies.   Review of Systems Review of Systems  Constitutional:  Positive for activity change. Negative for appetite change, fatigue and fever.  Respiratory:  Negative for cough and shortness of breath.   Cardiovascular:  Negative for chest pain.  Gastrointestinal:  Negative for abdominal pain, diarrhea, nausea and vomiting.  Skin:  Positive for wound.  Neurological:  Negative for dizziness, light-headedness and headaches.    Physical Exam Triage Vital Signs ED Triage Vitals  Enc Vitals Group     BP 12/02/21 1556 111/84     Pulse Rate 12/02/21 1556 90     Resp 12/02/21 1556 16     Temp 12/02/21 1556 98.2 F (36.8 C)     Temp Source 12/02/21 1556 Oral     SpO2 12/02/21 1556 98 %     Weight --      Height --      Head Circumference --      Peak Flow --      Pain Score 12/02/21 1557 10     Pain Loc --      Pain Edu? --      Excl. in [place]? --    No data found.  Updated Vital Signs BP 111/84 (BP Location: Right Arm)    Pulse 90    Temp 98.2 F (36.8 C) (Oral)    Resp 16    SpO2 98%   Visual Acuity Right Eye Distance:   Left Eye Distance:   Bilateral Distance:    Right Eye Near:   Left Eye Near:    Bilateral Near:     Physical Exam Vitals reviewed.  Constitutional:      General: He is awake.     Appearance: Normal appearance. He is well-developed. He is not ill-appearing.     Comments: Appears stated age in no acute distress sitting comfortably in exam room  HENT:     Head: Normocephalic and atraumatic.     Mouth/Throat:     Pharynx: No oropharyngeal exudate, posterior oropharyngeal erythema or uvula swelling.  Cardiovascular:      Rate and Rhythm: Normal rate and regular rhythm.     Heart sounds: Normal heart sounds, S1 normal and S2 normal. No murmur heard. Pulmonary:  Effort: Pulmonary effort is normal.     Breath sounds: Normal breath sounds. No stridor. No wheezing, rhonchi or rales.     Comments: Clear to auscultation bilaterally Abdominal:     General: Bowel sounds are normal.     Palpations: Abdomen is soft.     Tenderness: There is no abdominal tenderness.  Skin:    Findings: Wound present.     Comments: 2 cm x 1 cm ulcerated wound noted midline thoracic back with purulent drainage.  No streaking or evidence of lymphangitis.  No surrounding erythema or pain.  Neurological:     Mental Status: He is alert.  Psychiatric:        Behavior: Behavior is cooperative.     UC Treatments / Results  Labs (all labs ordered are listed, but only abnormal results are displayed) Labs Reviewed - No data to display  EKG   Radiology No results found.  Procedures Procedures (including critical care time)  Medications Ordered in UC Medications - No data to display  Initial Impression / Assessment and Plan / UC Course  I have reviewed the triage vital signs and the nursing notes.  Pertinent labs & imaging results that were available during my care of the patient were reviewed by me and considered in my medical decision making (see chart for details).     Abscess is open and draining so I&D was not indicated in clinic today.  We will start doxycycline 100 mg twice daily.  He was given Bactroban to be used with dressing changes and we discussed appropriate wound care.  Patient did request pain medication but has ongoing prescription for tramadol so additional narcotic pain medication was not provided.  Recommended to use Tylenol ibuprofen over-the-counter for symptom relief.  Discussed alarm symptoms that warrant emergent evaluation including fever, increased pain, nausea, vomiting, body aches.  Strict return  precautions given to which he expressed understanding.    Final Clinical Impressions(s) / UC Diagnoses   Final diagnoses:  Abscess of back  Wound of back, unspecified laterality, initial encounter     Discharge Instructions      Take doxycycline 100 mg twice daily.  This can upset your stomach so take it with food.  Use Bactroban ointment with dressing changes.  Keep this area clean and change dressing twice daily.  You can use over-the-counter medication as well as previously prescribed tramadol for pain.  If you develop any fever, body aches, headache, dizziness, nausea, vomiting, redness, increased pain, increased swelling need to be seen immediately.  Follow-up with your primary care provider next week to ensure adequate healing.    ED Prescriptions     Medication Sig Dispense Auth. Provider   mupirocin ointment (BACTROBAN) 2 % Apply 1 application topically daily. 22 g Tameem Pullara K, PA-C   doxycycline (VIBRAMYCIN) 100 MG capsule Take 1 capsule (100 mg total) by mouth 2 (two) times daily. 20 capsule Jazzmyn Filion K, PA-C      I have reviewed the PDMP during this encounter.   Terrilee Croak, PA-C 12/02/21 1636

## 2021-12-02 NOTE — ED Triage Notes (Signed)
Patient c/o abscess on back x 2 years.   Patient endorses worsening symptoms.   Patient endorses drainage.   Patient had his girlfriend drain it at home.   Patient hasn't used any medications for symptoms.

## 2021-12-02 NOTE — Discharge Instructions (Signed)
Take doxycycline 100 mg twice daily.  This can upset your stomach so take it with food.  Use Bactroban ointment with dressing changes.  Keep this area clean and change dressing twice daily.  You can use over-the-counter medication as well as previously prescribed tramadol for pain.  If you develop any fever, body aches, headache, dizziness, nausea, vomiting, redness, increased pain, increased swelling need to be seen immediately.  Follow-up with your primary care provider next week to ensure adequate healing.

## 2021-12-09 ENCOUNTER — Telehealth: Payer: Self-pay | Admitting: Family Medicine

## 2021-12-10 ENCOUNTER — Ambulatory Visit: Payer: Medicaid Other | Admitting: Physician Assistant

## 2021-12-10 ENCOUNTER — Other Ambulatory Visit: Payer: Self-pay

## 2021-12-10 DIAGNOSIS — Z978 Presence of other specified devices: Secondary | ICD-10-CM

## 2021-12-10 MED ORDER — TRAMADOL HCL 50 MG PO TABS: 50.0000 mg | ORAL_TABLET | Freq: Two times a day (BID) | ORAL | 0 refills | Status: AC

## 2021-12-10 NOTE — Progress Notes (Signed)
Courtesy refill, patient has a appointment with primary care provider on December 15, 2021.  Check of [place] controlled substance registry appropriate  Kennieth Rad, PA-C Physician Assistant [place] [email or web address]

## 2021-12-11 ENCOUNTER — Encounter: Payer: Self-pay | Admitting: *Deleted

## 2021-12-15 ENCOUNTER — Ambulatory Visit (INDEPENDENT_AMBULATORY_CARE_PROVIDER_SITE_OTHER): Payer: Medicaid Other | Admitting: Family Medicine

## 2021-12-15 ENCOUNTER — Encounter: Payer: Self-pay | Admitting: Family Medicine

## 2021-12-15 ENCOUNTER — Other Ambulatory Visit (HOSPITAL_COMMUNITY)
Admission: RE | Admit: 2021-12-15 | Discharge: 2021-12-15 | Disposition: A | Discharge status: Home / Self Care | Payer: Medicaid Other | Source: Ambulatory Visit | Attending: Family Medicine | Admitting: Family Medicine

## 2021-12-15 ENCOUNTER — Other Ambulatory Visit: Payer: Self-pay

## 2021-12-15 VITALS — BP 113/79 | HR 88 | Temp 98.0°F | Resp 16 | Wt 188.6 lb

## 2021-12-15 DIAGNOSIS — Z113 Encounter for screening for infections with a predominantly sexual mode of transmission: Secondary | ICD-10-CM | POA: Insufficient documentation

## 2021-12-15 DIAGNOSIS — L02212 Cutaneous abscess of back [any part, except buttock]: Secondary | ICD-10-CM

## 2021-12-15 MED ORDER — IBUPROFEN 600 MG PO TABS: 600.0000 mg | ORAL_TABLET | Freq: Three times a day (TID) | ORAL | 0 refills | Status: DC | PRN

## 2021-12-15 MED ORDER — ACETAMINOPHEN 500 MG PO TABS: 500.0000 mg | ORAL_TABLET | Freq: Four times a day (QID) | ORAL | 0 refills | Status: DC | PRN

## 2021-12-15 NOTE — Progress Notes (Signed)
Patient is herr for follow-up cyst on the back.Patient said that there is still drainage.Pain is 10/10 with no relief from pain medication  Patient would like STI check

## 2021-12-16 ENCOUNTER — Encounter: Payer: Self-pay | Admitting: Family Medicine

## 2021-12-16 LAB — URINE CYTOLOGY ANCILLARY ONLY
Chlamydia: NEGATIVE
Comment: NEGATIVE
Comment: NEGATIVE
Comment: NORMAL
Neisseria Gonorrhea: NEGATIVE
Trichomonas: NEGATIVE

## 2021-12-16 NOTE — Progress Notes (Signed)
Established Patient Office Visit  Subjective:  Patient ID: [name], male    DOB: 29-Jun-1981  Age: 41 y.o. MRN: [id number]  CC:  Chief Complaint  Patient presents with   Follow-up    HPI [name] presents for follow up of cyst on back. Patient is just completing course of abx and had a friend lance the area. He reports that the are is painful. Patient would also like to be tested for STI.   Past Medical History:  Diagnosis Date   Alcohol abuse    Seizures ([place])    Stroke [facility])     Past Surgical History:  Procedure Laterality Date   Above knee amputation of left leg     BELOW KNEE LEG AMPUTATION     LEFT   below the knee amputation     INTRAMEDULLARY (IM) NAIL INTERTROCHANTERIC Left 05/25/2020   Procedure: INTRAMEDULLARY (IM) NAIL INTERTROCHANTRIC;  Surgeon: Shona Needles, MD;  Location: [facility];  Service: Orthopedics;  Laterality: Left;    Family History  Problem Relation Age of Onset   Heart attack Mother        Negative Hx    Social History   Socioeconomic History   Marital status: Single    Spouse name: Not on file   Number of children: Not on file   Years of education: Not on file   Highest education level: Not on file  Occupational History   Not on file  Tobacco Use   Smoking status: Every Day    Packs/day: 0.33    Years: 20.00    Pack years: 6.60    Types: Cigarettes   Smokeless tobacco: Never  Substance and Sexual Activity   Alcohol use: Yes    Alcohol/week: 2.0 standard drinks    Types: 2 Cans of beer per week    Comment: not every day   Drug use: No   Sexual activity: Not Currently  Other Topics Concern   Not on file  Social History Narrative   ** Merged History Encounter **       Social Determinants of Health   Financial Resource Strain: Not on file  Food Insecurity: Not on file  Transportation Needs: Not on file  Physical Activity: Not on file  Stress: Not on file  Social Connections: Not on file  Intimate Partner  Violence: Not on file    ROS Review of Systems  Constitutional:  Negative for fever.  All other systems reviewed and are negative.  Objective:   Today's Vitals: BP 113/79    Pulse 88    Temp 98 F (36.7 C) (Oral)    Resp 16    Wt 188 lb 9.6 oz (85.5 kg)    SpO2 95%    BMI 27.85 kg/m   Physical Exam Vitals and nursing note reviewed.  Constitutional:      General: He is not in acute distress. Cardiovascular:     Rate and Rhythm: Normal rate and regular rhythm.  Pulmonary:     Effort: Pulmonary effort is normal.     Breath sounds: Normal breath sounds.  Skin:    Comments: Back with solitary lesion with minimal purulent drainage and   Neurological:     General: No focal deficit present.     Mental Status: He is alert and oriented to person, place, and time.    Assessment & Plan:   1. Abscess of back Improving since abx. Tylenol/ibuprofen prescribed. Patient to take previously prescribed tramadol for severe  pain.   2. Screening for STDs (sexually transmitted diseases) Urine testing for STI results pending.    - Urine cytology ancillary only    Outpatient Encounter Medications as of 12/15/2021  Medication Sig   acetaminophen (TYLENOL) 500 MG tablet Take 1 tablet (500 mg total) by mouth every 6 (six) hours as needed.   doxycycline (VIBRAMYCIN) 100 MG capsule Take 1 capsule (100 mg total) by mouth 2 (two) times daily.   DULoxetine (CYMBALTA) 20 MG capsule Take 1 capsule (20 mg total) by mouth daily.   folic acid (FOLVITE) 1 MG tablet Take 1 tablet (1 mg total) by mouth daily.   ibuprofen (ADVIL) 600 MG tablet Take 1 tablet (600 mg total) by mouth every 8 (eight) hours as needed.   levETIRAcetam (KEPPRA) 750 MG tablet Take 1 tablet (750 mg total) by mouth 2 (two) times daily.   meloxicam (MOBIC) 15 MG tablet Take 1 tablet (15 mg total) by mouth daily as needed for pain.   methocarbamol (ROBAXIN) 750 MG tablet Take 1 tablet (750 mg total) by mouth 2 (two) times daily.    mupirocin ointment (BACTROBAN) 2 % Apply 1 application topically daily.   sildenafil (VIAGRA) 100 MG tablet Take 1 tablet (100 mg total) by mouth daily as needed for erectile dysfunction.   sildenafil (VIAGRA) 50 MG tablet Take 1 tablet (50 mg total) by mouth daily as needed for erectile dysfunction.   [EXPIRED] traMADol (ULTRAM) 50 MG tablet Take 1 tablet (50 mg total) by mouth 2 (two) times daily for 5 days. (Patient not taking: Reported on 12/15/2021)   No facility-administered encounter medications on file as of 12/15/2021.    Follow-up: No follow-ups on file.   Becky Sax, MD

## 2022-02-02 ENCOUNTER — Telehealth: Payer: Self-pay | Admitting: Family Medicine

## 2022-02-02 NOTE — Telephone Encounter (Signed)
°  meloxicam (MOBIC) 15 MG tablet [[id number]]   Pharmacy  [facility] [phone] - [place], [address] AT [address] [place]  [address] [place] [address]  Phone:  [phone]  Fax:  [phone]  DEA #:  [id number]

## 2022-02-05 IMAGING — CT CT MAXILLOFACIAL W/O CM
3 of 4 series · 15 of 47 positions shown, 18 images · non-contrast
Comparison: May 23, 2020.

CLINICAL DATA: Chronic sinusitis.

EXAM:
CT MAXILLOFACIAL WITHOUT CONTRAST
TECHNIQUE: Multidetector CT imaging of the maxillofacial structures was
performed. Multiplanar CT image reconstructions were also generated.

[Series 8: coronal st · coronal · 0.35mm/px · 3 of 101 slices shown]
[im 34/101  bone]
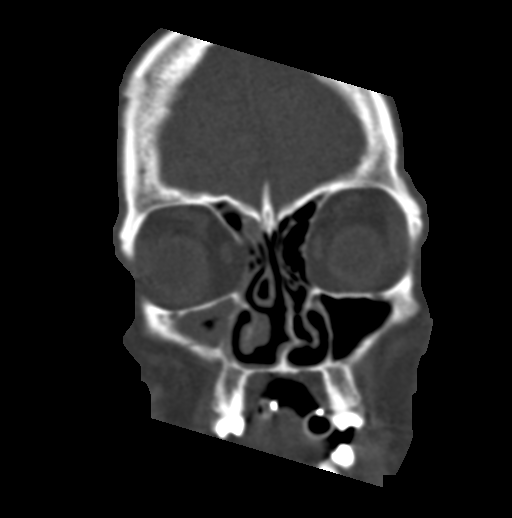
[im 45/101  bone]
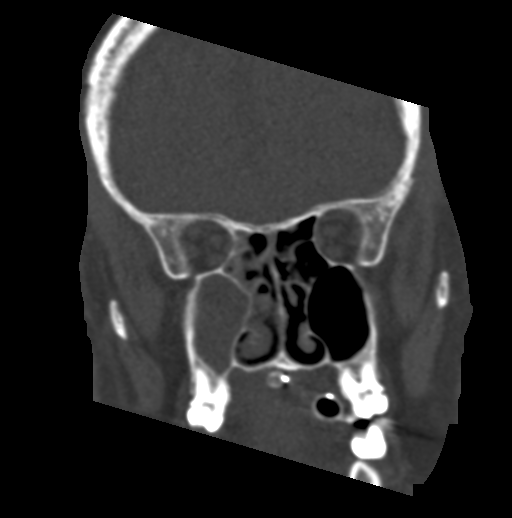
[im 56/101  bone]
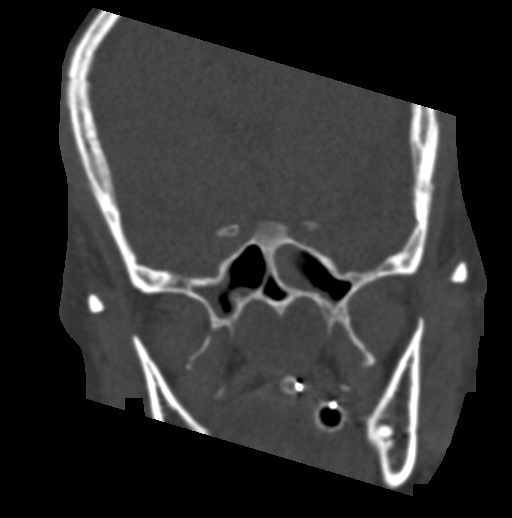

[Series 9: sagittal st · sagittal · 0.35mm/px · 3 of 90 slices shown]
[im 40/90  bone]
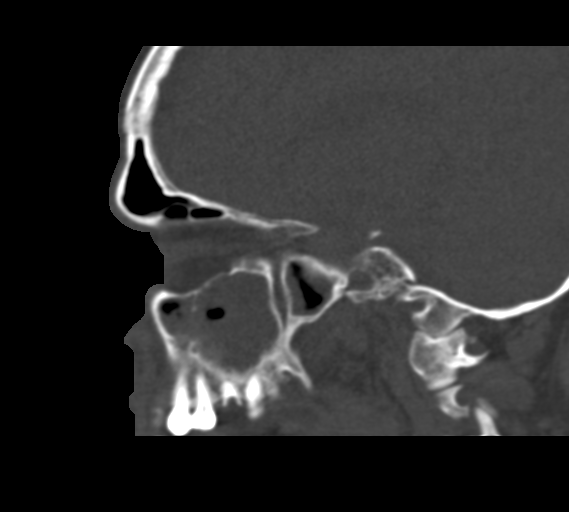
[im 45/90  bone]
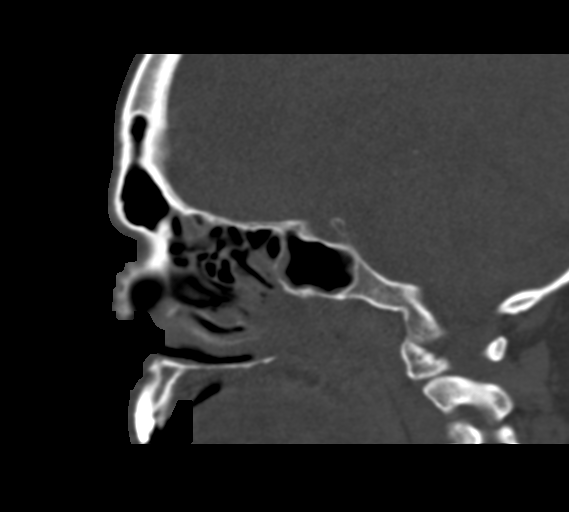
[im 51/90  bone]
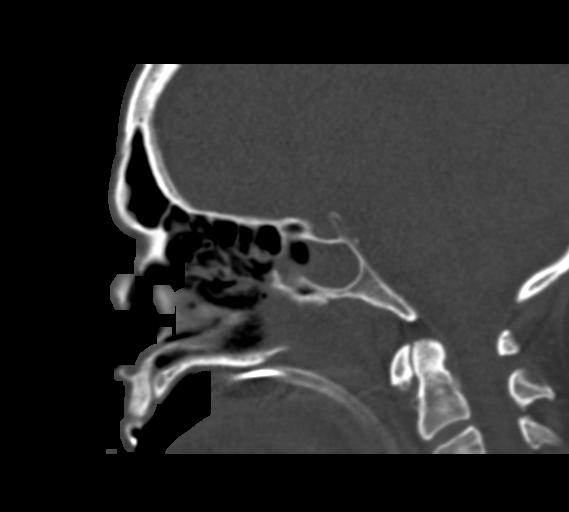

[Series 11: axial st · axial · 0.35mm/px · z∈[-359,-208]mm · 9 of 91 slices shown, 12 images]
[im 6/91  brain]
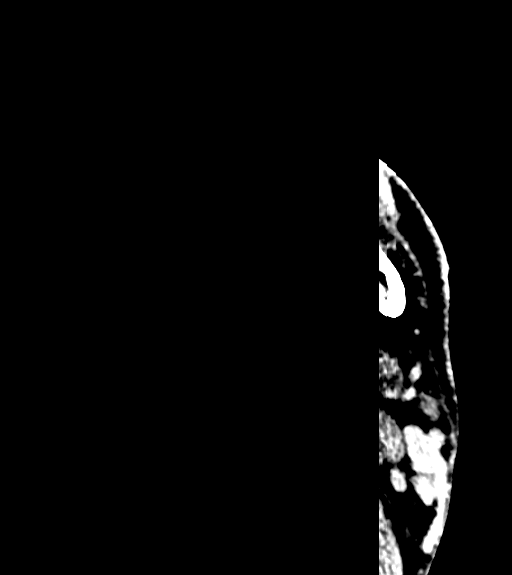
[im 6/91  bone]
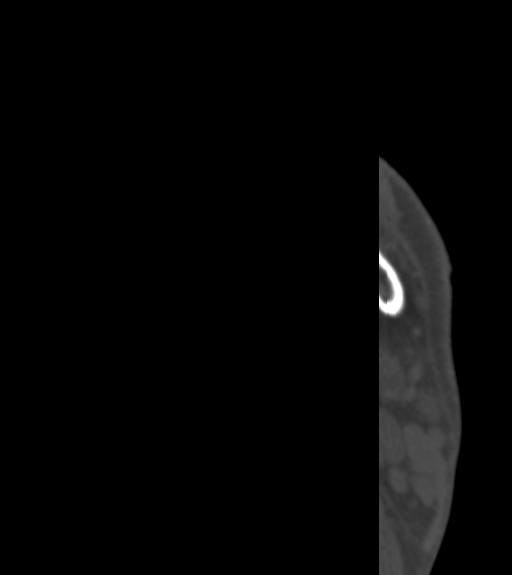
[im 17/91  bone]
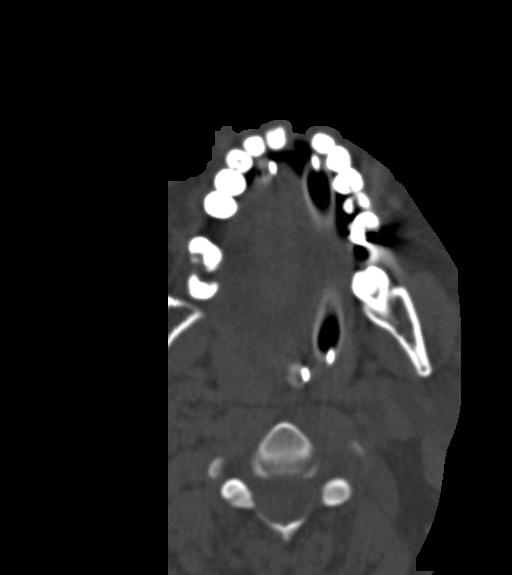
[im 29/91  bone]
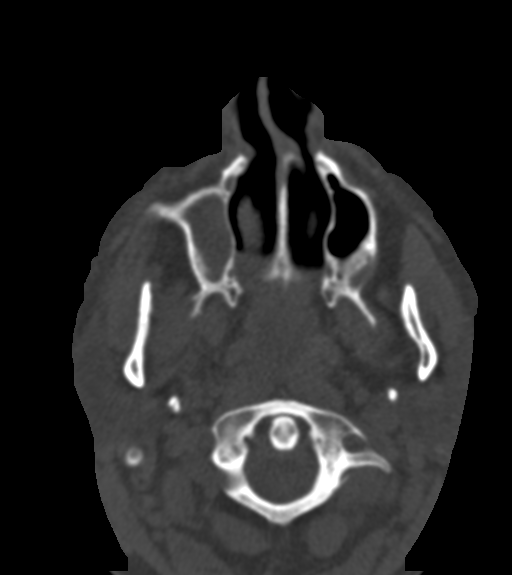
[im 34/91  bone]
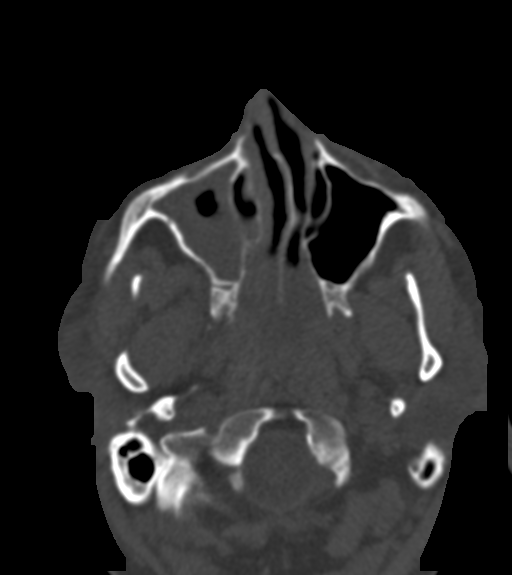
[im 46/91  brain]
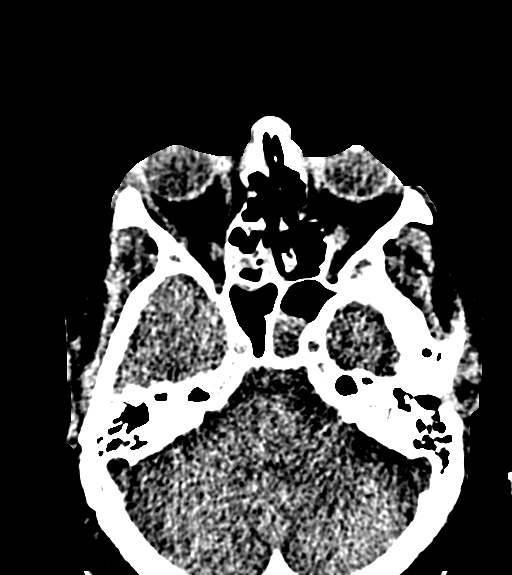
[im 46/91  bone]
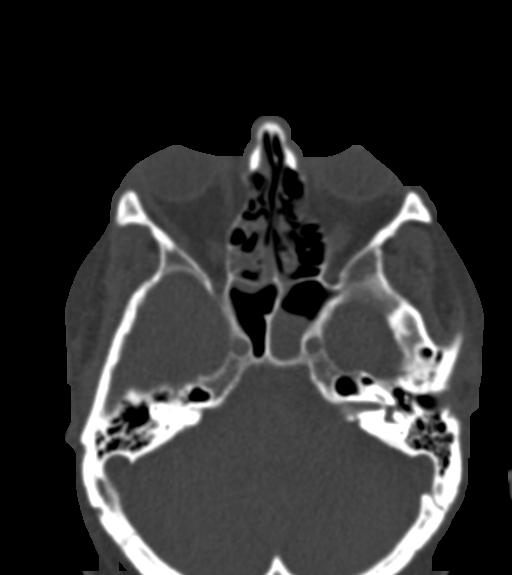
[im 57/91  bone]
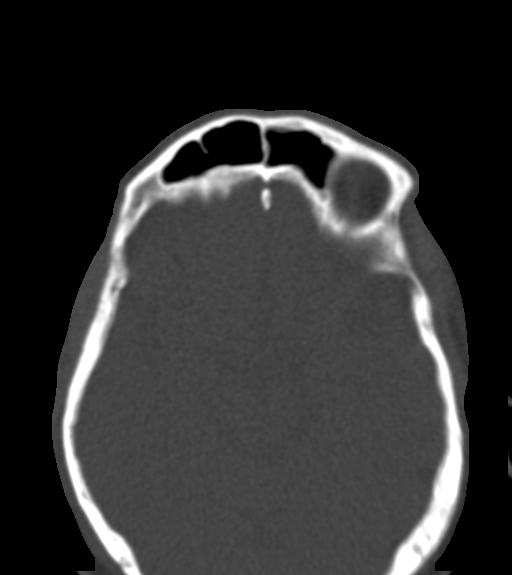
[im 62/91  bone]
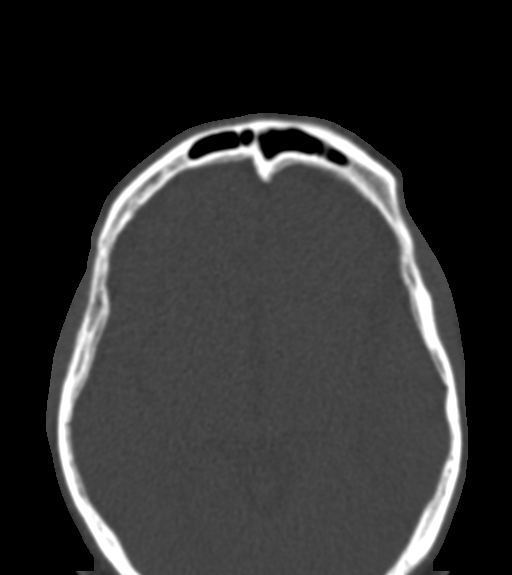
[im 74/91  bone]
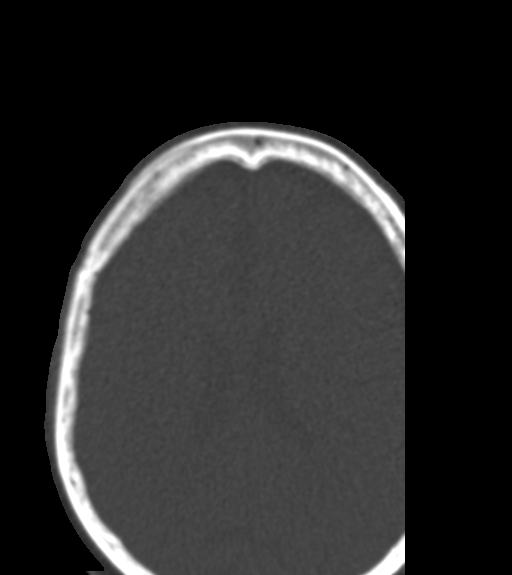
[im 85/91  brain]
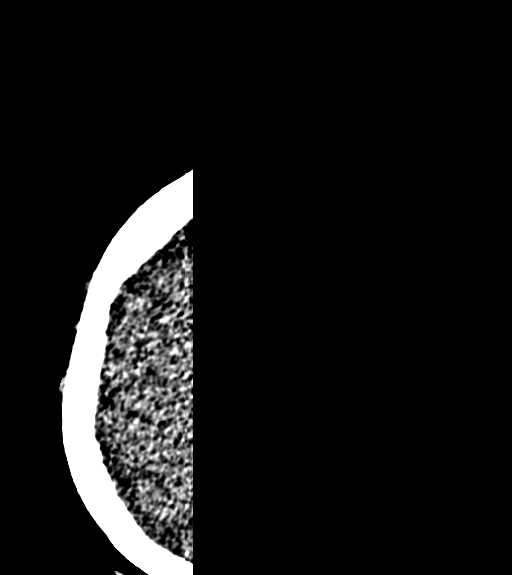
[im 85/91  bone]
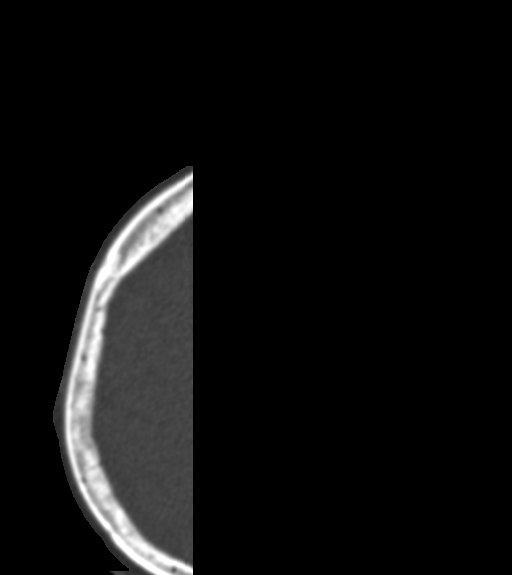

[15 of 47 positions shown; findings below may reference images not displayed]

FINDINGS: Osseous: No fracture or mandibular dislocation. No destructive
process.

Orbits: Negative. No traumatic or inflammatory finding.

Sinuses: Severe mucosal thickening is noted in right maxillary sinus
consistent with probable chronic sinusitis. Air-fluid level is noted
in left sphenoid sinus consistent with acute sinusitis.

Soft tissues: Negative.

Limited intracranial: No significant or unexpected finding.
IMPRESSION: Severe mucosal thickening is noted in right maxillary sinus
consistent with probable chronic sinusitis. Air-fluid level is noted
in left sphenoid sinus consistent with acute sinusitis.

## 2022-02-17 ENCOUNTER — Other Ambulatory Visit: Payer: Self-pay | Admitting: Family Medicine

## 2022-05-13 ENCOUNTER — Encounter (HOSPITAL_COMMUNITY): Payer: Self-pay

## 2022-05-13 ENCOUNTER — Ambulatory Visit (HOSPITAL_COMMUNITY)
Admission: RE | Admit: 2022-05-13 | Discharge: 2022-05-13 | Disposition: A | Discharge status: Home / Self Care | Payer: Medicaid Other | Source: Ambulatory Visit | Attending: Internal Medicine | Admitting: Internal Medicine

## 2022-05-13 VITALS — BP 103/79 | HR 110 | Temp 98.6°F | Resp 16

## 2022-05-13 DIAGNOSIS — H538 Other visual disturbances: Secondary | ICD-10-CM

## 2022-05-13 MED ORDER — TETRACAINE HCL 0.5 % OP SOLN
OPHTHALMIC | Status: AC
  Filled 2022-05-13: qty 4

## 2022-05-13 MED ORDER — FLUORESCEIN SODIUM 1 MG OP STRP
ORAL_STRIP | OPHTHALMIC | Status: AC
  Filled 2022-05-13: qty 1

## 2022-05-13 NOTE — Discharge Instructions (Signed)
  The ophthalmologist office tomorrow by 7:45 AM to be seen by 8 AM.

## 2022-05-13 NOTE — ED Triage Notes (Signed)
Patient c/o bilateral blurry vision x 1 week.   Patient denies fall or trauma.   Patient endorses redness. Patient endorses eye dryness.   Patient endorses bilateral eye crusting in the morning.   Patient denies pain.   Patient has used OTC eye drops with no relief of symptoms.

## 2022-05-17 NOTE — ED Provider Notes (Signed)
[place]    CSN: [id number] Arrival date & time: 05/13/22  1352      History   Chief Complaint Chief Complaint  Patient presents with   Blurred Vision   APPT 1400    HPI [name] is a 41 y.o. male comes to the urgent care with 1 week history of blurry vision in both eyes.  Patient symptoms started about 2 weeks ago with redness in both eyes.  No itchy eyes.  No pain in both eyes.  No history of seasonal allergies.  Over the next week patient started experiencing blurriness of vision.  No pain in both eyes.  No increased lacrimation or tearing of the eye.  No fever or chills.  No family history of glaucoma.  No personal history of glaucoma.  Patient denies any contact use.  The patient also complains of light sensitivity.  He denies use of reading glasses.  No loss of balance.  No weight loss.  No numbness or tingling. 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   Fever 05/30/2020   Positive blood culture 05/30/2020   Sinusitis 05/30/2020   Delirium 05/30/2020   Other specified anemias 05/30/2020   Encephalopathy acute    Acute hypoxemic respiratory failure ([place])    Elective surgery    S/p left hip fracture    Respiratory failure ([place])    Seizure ([place]) 05/23/2020   Sepsis ([place]) 06/05/2016   Thrombocytopenia ([place]) 06/05/2016   Alcohol abuse with intoxication ([place]) 06/05/2016   Fatty liver 06/05/2016   Asthma 06/05/2016   Fall at home 05/03/2012   postural orthostatic tachycardia 05/03/2012   Hypokalemia 05/03/2012   Steatosis of liver 05/03/2012   Gallstones 05/03/2012   Thoracic  ascending aortic aneurysm ([place]) 05/03/2012   History of mental retardation 05/03/2012   Tobacco abuse 05/03/2012   Syncope 04/27/2012   Alcohol abuse 04/27/2012   Hx of BKA, 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 Authorizing Provider  doxycycline (VIBRAMYCIN) 100 MG capsule Take 1 capsule (100 mg total) by mouth 2 (two) times daily. 12/02/21   Raspet, Derry Skill, PA-C  DULoxetine (CYMBALTA) 20 MG capsule Take 1 capsule (20 mg total) by mouth daily. 11/19/21   Dorna Mai, MD  folic acid (FOLVITE) 1 MG tablet TAKE 1 TABLET(1 MG) BY MOUTH DAILY 02/18/22   Dorna Mai, MD  ibuprofen (ADVIL) 600 MG tablet Take 1 tablet (600 mg total) by mouth every 8 (eight) hours as needed. 12/15/21   Dorna Mai, MD  levETIRAcetam (KEPPRA) 750 MG tablet Take 1 tablet (750 mg total) by mouth 2 (two) times daily. 11/19/21   Dorna Mai, MD  meloxicam (MOBIC) 15 MG tablet Take 1 tablet (15 mg total) by mouth daily as needed for pain. 11/19/21   Dorna Mai, MD  methocarbamol (ROBAXIN) 750 MG tablet Take 1 tablet (750 mg total) by mouth 2 (two) times daily. 11/19/21   Dorna Mai, MD  mupirocin ointment (BACTROBAN) 2 % Apply 1 application topically daily. 12/02/21   Raspet, Derry Skill, PA-C  sildenafil (VIAGRA) 100 MG tablet Take 1 tablet (100 mg total) by mouth daily as needed for erectile dysfunction. 11/19/21   Dorna Mai, MD  sildenafil (VIAGRA) 50 MG tablet Take 1 tablet (50 mg total) by mouth daily as needed for erectile dysfunction. 11/23/21   Dorna Mai, MD  Vitamin D, Ergocalciferol, (DRISDOL) 1.25 MG (50000 UNIT) CAPS capsule TAKE 1 CAPSULE BY MOUTH EVERY 7 DAYS 02/18/22   Dorna Mai, MD    Family History Family History  Problem Relation Age of Onset   Heart attack Mother        Negative Hx    Social History Social History   Tobacco Use   Smoking status: Every Day    Packs/day: 0.33    Years: 20.00    Pack years: 6.60    Types: Cigarettes   Smokeless tobacco: Never  Substance Use Topics   Alcohol use: Yes    Alcohol/week: 2.0 standard drinks    Types: 2 Cans of beer per week    Comment: not every day   Drug use: No     Allergies   Patient has no known allergies.   Review of Systems Review of Systems  HENT: Negative.  Negative for ear discharge, ear pain, sore throat and voice change.   Gastrointestinal: Negative.   Genitourinary: Negative.   Musculoskeletal: Negative.   Neurological: Negative.   Psychiatric/Behavioral: Negative.      Physical Exam Triage Vital Signs ED Triage Vitals  Enc Vitals Group     BP 05/13/22 1415 103/79     Pulse Rate 05/13/22 1415 (!) 110     Resp 05/13/22 1415 16     Temp 05/13/22 1415 98.6 F (37 C)     Temp Source 05/13/22 1415 Oral     SpO2 05/13/22 1415 98 %     Weight --      Height --      Head Circumference --      Peak Flow --      Pain Score 05/13/22 1413 0     Pain Loc --      Pain Edu? --      Excl. in [place]? --    No data found.  Updated Vital Signs BP 103/79 (BP Location: Left Arm)   Pulse (!) 110   Temp 98.6 F (37 C) (Oral)   Resp 16   SpO2 98%   Visual Acuity Right Eye Distance: 20/70 Left Eye Distance: 20/70 Bilateral Distance: 20/70  Right Eye Near:   Left Eye Near:    Bilateral Near:     Physical Exam Vitals and nursing note reviewed.  Constitutional:      General: He is not in acute distress.    Appearance: He is not ill-appearing.  Eyes:     Extraocular Movements: Extraocular movements intact.     Pupils: Pupils are  equal, round, and reactive to light.     Comments: Bilateral conjunctival erythema.  Vernal  conjunctivitis appreciated  Cardiovascular:     Rate and Rhythm: Normal rate and regular rhythm.     Pulses: Normal pulses.     Heart sounds: Normal heart sounds.  Pulmonary:     Effort: Pulmonary effort is normal.     Breath sounds: Normal breath sounds.  Musculoskeletal:        General: Normal range of motion.  Neurological:     Mental Status: He is alert.     UC Treatments / Results  Labs (all labs ordered are listed, but only abnormal results are displayed) Labs Reviewed - No data to display  EKG   Radiology No results found.  Procedures Procedures (including critical care time)  Medications Ordered in UC Medications - No data to display  Initial Impression / Assessment and Plan / UC Course  I have reviewed the triage vital signs and the nursing notes.  Pertinent labs & imaging results that were available during my care of the patient were reviewed by me and considered in my medical decision making (see chart for details).     1.  Blurry vision, bilateral: Fluorescein stain is negative for corneal abrasions Extraocular movement intact and pupils are reactive to light-direct and consensual Patient is advised to see an ophthalmologist to evaluate for glaucoma or iritis I made an appointment for the patient for tomorrow at 0800 I spoke with the ophthalmologist on-call and discussed the case with him. Final Clinical Impressions(s) / UC Diagnoses   Final diagnoses:  Blurry vision, bilateral     Discharge Instructions       The ophthalmologist office tomorrow by 7:45 AM to be seen by 8 AM.    ED Prescriptions   None    PDMP not reviewed this encounter.   Chase Picket, MD 05/17/22 [phone]

## 2022-05-30 ENCOUNTER — Other Ambulatory Visit: Payer: Self-pay | Admitting: Family Medicine

## 2022-05-31 NOTE — Telephone Encounter (Signed)
Requested medication (s) are due for refill today: yes  Requested medication (s) are on the active medication list: yes  Last refill:  02/18/22  Future visit scheduled: no  Notes to clinic:  Unable to refill per protocol, cannot delegate for 50,000 units or higher. Routing for approval.     Requested Prescriptions  Pending Prescriptions Disp Refills   Vitamin D, Ergocalciferol, (DRISDOL) 1.25 MG (50000 UNIT) CAPS capsule [Pharmacy Med Name: VITAMIN D2 50,000IU (ERGO) CAP RX] 12 capsule 0    Sig: TAKE 1 CAPSULE BY MOUTH EVERY 7 DAYS     Endocrinology:  Vitamins - Vitamin D Supplementation 2 Failed - 05/30/2022  7:13 AM      Failed - Manual Review: Route requests for 50,000 IU strength to the provider      Failed - Ca in normal range and within 360 days    Calcium  Date Value Ref Range Status  07/12/2021 8.4 (L) 8.9 - 10.3 mg/dL Final   Calcium, Ion  Date Value Ref Range Status  07/08/2021 0.97 (L) 1.15 - 1.40 mmol/L Final         Failed - Vitamin D in normal range and within 360 days    Vit D, 25-Hydroxy  Date Value Ref Range Status  05/26/2020 7.85 (L) 30 - 100 ng/mL Final    Comment:    (NOTE) Vitamin D deficiency has been defined by the Institute of Medicine  and an Endocrine Society practice guideline as a level of serum 25-OH  vitamin D less than 20 ng/mL (1,2). The Endocrine Society went on to  further define vitamin D insufficiency as a level between 21 and 29  ng/mL (2).  1. IOM (Institute of Medicine). 2010. Dietary reference intakes for  calcium and D. [place]: The [employer]. 2. Holick MF, Binkley Nixon, Bischoff-Ferrari HA, et al. Evaluation,  treatment, and prevention of vitamin D deficiency: an Endocrine  Society clinical practice guideline, JCEM. 2011 Jul; 96(7): 1911-30.  Performed at [facility], [place] [address] [place], [place] [address]          Passed - Valid encounter within last 12 months    Recent Outpatient Visits            5 months ago Abscess of back   Primary Care at [facility], MD   6 months ago Dysthymia   Primary Care at [facility], MD

## 2022-07-13 ENCOUNTER — Other Ambulatory Visit: Payer: Self-pay | Admitting: Family Medicine

## 2022-07-13 NOTE — Telephone Encounter (Signed)
Schedule appointment?

## 2022-07-23 ENCOUNTER — Other Ambulatory Visit: Payer: Self-pay | Admitting: Family Medicine

## 2022-07-23 NOTE — Telephone Encounter (Signed)
Requested Prescriptions  Pending Prescriptions Disp Refills  . folic acid (FOLVITE) 1 MG tablet [Pharmacy Med Name: FOLIC ACID '1MG'$  TABLETS] 90 tablet 0    Sig: TAKE 1 TABLET(1 MG) BY MOUTH DAILY     Endocrinology:  Vitamins Passed - 07/23/2022  3:07 AM      Passed - Valid encounter within last 12 months    Recent Outpatient Visits          7 months ago Abscess of back   Primary Care at [facility], MD   8 months ago Dysthymia   Primary Care at [facility], MD

## 2022-07-30 NOTE — Telephone Encounter (Signed)
Error

## 2022-09-07 ENCOUNTER — Other Ambulatory Visit: Payer: Self-pay

## 2022-09-07 ENCOUNTER — Ambulatory Visit (HOSPITAL_COMMUNITY)
Admission: EM | Admit: 2022-09-07 | Discharge: 2022-09-07 | Disposition: A | Discharge status: Home / Self Care | Payer: Medicaid Other | Attending: Family Medicine | Admitting: Family Medicine

## 2022-09-07 ENCOUNTER — Encounter (HOSPITAL_COMMUNITY): Payer: Self-pay | Admitting: *Deleted

## 2022-09-07 DIAGNOSIS — L0201 Cutaneous abscess of face: Secondary | ICD-10-CM | POA: Diagnosis not present

## 2022-09-07 MED ORDER — KETOROLAC TROMETHAMINE 30 MG/ML IJ SOLN
INTRAMUSCULAR | Status: AC
  Filled 2022-09-07: qty 1

## 2022-09-07 MED ORDER — KETOROLAC TROMETHAMINE 30 MG/ML IJ SOLN
30.0000 mg | Freq: Once | INTRAMUSCULAR | Status: AC
  Administered 2022-09-07: 30 mg via INTRAMUSCULAR

## 2022-09-07 MED ORDER — LIDOCAINE-EPINEPHRINE 1 %-1:100000 IJ SOLN
INTRAMUSCULAR | Status: AC
  Filled 2022-09-07: qty 1

## 2022-09-07 MED ORDER — AMOXICILLIN-POT CLAVULANATE 875-125 MG PO TABS: 1.0000 | ORAL_TABLET | Freq: Two times a day (BID) | ORAL | 0 refills | Status: AC

## 2022-09-07 MED ORDER — IBUPROFEN 600 MG PO TABS: 600.0000 mg | ORAL_TABLET | Freq: Three times a day (TID) | ORAL | 0 refills | Status: DC | PRN

## 2022-09-07 NOTE — ED Provider Notes (Signed)
[place]    CSN: [id number] Arrival date & time: 09/07/22  6270      History   Chief Complaint Chief Complaint  Patient presents with   skin swelling    HPI [name] is a 41 y.o. male.   HPI Here with a swelling on his right lateral eyebrow for about 4 -5 days.  Despite doing warm compresses it has been worsening and hurts.  No fever or chills.  He denies any allergies to medications.  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   Fever 05/30/2020   Positive blood culture 05/30/2020   Sinusitis 05/30/2020   Delirium 05/30/2020   Other specified anemias 05/30/2020   Encephalopathy acute    Acute hypoxemic respiratory failure ([place])    Elective surgery    S/p left hip fracture    Respiratory failure ([place])    Seizure ([place]) 05/23/2020   Sepsis ([place]) 06/05/2016   Thrombocytopenia ([place]) 06/05/2016   Alcohol abuse with intoxication ([place]) 06/05/2016   Fatty liver 06/05/2016   Asthma 06/05/2016   Fall at home 05/03/2012   postural orthostatic tachycardia 05/03/2012   Hypokalemia 05/03/2012   Steatosis of liver 05/03/2012   Gallstones 05/03/2012   Thoracic ascending aortic aneurysm ([place]) 05/03/2012   History of mental retardation 05/03/2012   Tobacco abuse 05/03/2012   Syncope 04/27/2012   Alcohol abuse 04/27/2012   Hx of BKA, 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 Authorizing Provider  amoxicillin-clavulanate (AUGMENTIN) 875-125 MG tablet Take 1 tablet by mouth 2 (two) times daily for 7 days. 09/07/22 09/14/22 Yes Latravious Levitt, Gwenlyn Perking, MD  DULoxetine (CYMBALTA) 20 MG capsule Take 1 capsule (20 mg total) by mouth daily. 11/19/21   Dorna Mai, MD  folic acid (FOLVITE) 1 MG tablet TAKE 1 TABLET(1 MG) BY MOUTH DAILY 07/23/22   Dorna Mai, MD  ibuprofen (ADVIL) 600 MG tablet Take 1 tablet (600 mg total) by mouth every 8 (eight) hours as needed (pain). 09/07/22   Barrett Henle, MD  levETIRAcetam (KEPPRA) 750 MG tablet Take 1 tablet (750 mg total) by mouth 2 (two) times daily. 11/19/21   Dorna Mai, MD  methocarbamol (ROBAXIN) 750 MG tablet Take 1 tablet (750 mg total) by mouth 2 (two) times daily. 11/19/21   Dorna Mai, MD  mupirocin ointment (BACTROBAN) 2 % Apply 1 application topically daily. 12/02/21   Raspet, Derry Skill, PA-C  sildenafil (VIAGRA) 100 MG tablet Take 1 tablet (100 mg total) by mouth daily  as needed for erectile dysfunction. 11/19/21   Dorna Mai, MD  sildenafil (VIAGRA) 50 MG tablet Take 1 tablet (50 mg total) by mouth daily as needed for erectile dysfunction. 11/23/21   Dorna Mai, MD  Vitamin D, Ergocalciferol, (DRISDOL) 1.25 MG (50000 UNIT) CAPS capsule TAKE 1 CAPSULE BY MOUTH EVERY 7 DAYS 02/18/22   Dorna Mai, MD    Family History Family History  Problem Relation Age of Onset   Heart attack Mother        Negative Hx    Social History Social History   Tobacco Use   Smoking status: Every Day    Packs/day: 0.33    Years: 20.00    Total pack years: 6.60    Types: Cigarettes   Smokeless tobacco: Never  Substance Use Topics   Alcohol use: Yes     Alcohol/week: 2.0 standard drinks of alcohol    Types: 2 Cans of beer per week    Comment: not every day   Drug use: No     Allergies   Patient has no known allergies.   Review of Systems Review of Systems   Physical Exam Triage Vital Signs ED Triage Vitals  Enc Vitals Group     BP 09/07/22 1001 107/69     Pulse Rate 09/07/22 1001 (!) 102     Resp 09/07/22 1001 20     Temp 09/07/22 1001 98.9 F (37.2 C)     Temp Source 09/07/22 1001 Oral     SpO2 09/07/22 1001 100 %     Weight --      Height --      Head Circumference --      Peak Flow --      Pain Score 09/07/22 0959 10     Pain Loc --      Pain Edu? --      Excl. in [place]? --    No data found.  Updated Vital Signs BP 107/69   Pulse (!) 102   Temp 98.9 F (37.2 C) (Oral)   Resp 20   SpO2 100%   Visual Acuity Right Eye Distance:   Left Eye Distance:   Bilateral Distance:    Right Eye Near:   Left Eye Near:    Bilateral Near:     Physical Exam Vitals reviewed.  Constitutional:      General: He is not in acute distress.    Appearance: He is not ill-appearing, toxic-appearing or diaphoretic.  HENT:     Head:     Comments: The right upper eyelid is swollen diffusely.  There is some mild erythema of the eyelid.  There is a more pronounced swelling with some induration and redness over the lateral portion of the right eyebrow.  The swelling there is about 2 and half centimeters in diameter.  There is fluctuance on the lateral portion.  There is no drainage present  Also has a smaller left upper eyelid swelling about half a centimeter in diameter.  It is not draining or pointing Cardiovascular:     Rate and Rhythm: Normal rate and regular rhythm.  Pulmonary:     Effort: Pulmonary effort is normal.     Breath sounds: Normal breath sounds.  Neurological:     Mental Status: He is oriented to person, place, and time.  Psychiatric:        Behavior: Behavior normal.      UC Treatments / Results   Labs (all labs ordered are listed, but only  abnormal results are displayed) Labs Reviewed - No data to display  EKG   Radiology No results found.  Procedures Procedures (including critical care time)  Medications Ordered in UC Medications  ketorolac (TORADOL) 30 MG/ML injection 30 mg (has no administration in time range)    Initial Impression / Assessment and Plan / UC Course  I have reviewed the triage vital signs and the nursing notes.  Pertinent labs & imaging results that were available during my care of the patient were reviewed by me and considered in my medical decision making (see chart for details).        After verbal consent is obtained 1% lidocaine with epinephrine is used for infiltration anesthesia over the lateral aspect of the abscess wall.  Under clean conditions, a #11 blade is used to make a stab wound at the lateral aspect of the abscess.  Approximately 3 mL of purulent material was obtained.  Bandages applied  Wound care is explained to the patient and his family.  Warm compresses, and I will treat with Augmentin and for pain. Final diagnoses:  Abscess of face     Discharge Instructions      You have been given a shot of Toradol 30 mg today.  Take ibuprofen 600 mg--1 tab every 8 hours as needed for pain.  Take amoxicillin-clavulanate 875 mg--1 tab twice daily with food for 7 days  Warm compresses can help the circulation.  Wash wound 2 times daily with warm soapy water     ED Prescriptions     Medication Sig Dispense Auth. Provider   ibuprofen (ADVIL) 600 MG tablet Take 1 tablet (600 mg total) by mouth every 8 (eight) hours as needed (pain). 21 tablet Barrett Henle, MD   amoxicillin-clavulanate (AUGMENTIN) 875-125 MG tablet Take 1 tablet by mouth 2 (two) times daily for 7 days. 14 tablet Domanique Huesman, Gwenlyn Perking, MD      I have reviewed the PDMP during this encounter.   Barrett Henle, MD 09/07/22 1115

## 2022-09-07 NOTE — ED Triage Notes (Signed)
PT has a large swollen area above Rt eye. Pt has not had an injury and does not known what happened to make the swelling occur. Pt report swelling started 4 days ago.

## 2022-09-07 NOTE — Discharge Instructions (Addendum)
You have been given a shot of Toradol 30 mg today.  Take ibuprofen 600 mg--1 tab every 8 hours as needed for pain.  Take amoxicillin-clavulanate 875 mg--1 tab twice daily with food for 7 days  Warm compresses can help the circulation.  Wash wound 2 times daily with warm soapy water

## 2022-11-13 ENCOUNTER — Ambulatory Visit (HOSPITAL_COMMUNITY)
Admission: RE | Admit: 2022-11-13 | Discharge: 2022-11-13 | Disposition: A | Discharge status: Other Acute Inpatient Hospital | Payer: Medicaid Other | Source: Ambulatory Visit | Attending: Family Medicine | Admitting: Family Medicine

## 2022-11-13 ENCOUNTER — Observation Stay (HOSPITAL_COMMUNITY)
Admission: EM | Admit: 2022-11-13 | Discharge: 2022-11-14 | Disposition: A | Discharge status: Home / Self Care | Payer: Medicaid Other | Attending: Internal Medicine | Admitting: Internal Medicine

## 2022-11-13 ENCOUNTER — Emergency Department (HOSPITAL_COMMUNITY): Payer: Medicaid Other

## 2022-11-13 ENCOUNTER — Encounter (HOSPITAL_COMMUNITY): Payer: Self-pay

## 2022-11-13 ENCOUNTER — Other Ambulatory Visit: Payer: Self-pay

## 2022-11-13 VITALS — BP 90/42 | HR 102 | Temp 98.2°F | Resp 18

## 2022-11-13 DIAGNOSIS — R770 Abnormality of albumin: Secondary | ICD-10-CM | POA: Diagnosis not present

## 2022-11-13 DIAGNOSIS — I5022 Chronic systolic (congestive) heart failure: Secondary | ICD-10-CM | POA: Insufficient documentation

## 2022-11-13 DIAGNOSIS — R000 Tachycardia, unspecified: Secondary | ICD-10-CM

## 2022-11-13 DIAGNOSIS — I110 Hypertensive heart disease with heart failure: Secondary | ICD-10-CM | POA: Diagnosis not present

## 2022-11-13 DIAGNOSIS — F10930 Alcohol use, unspecified with withdrawal, uncomplicated: Secondary | ICD-10-CM | POA: Insufficient documentation

## 2022-11-13 DIAGNOSIS — Z8673 Personal history of transient ischemic attack (TIA), and cerebral infarction without residual deficits: Secondary | ICD-10-CM | POA: Insufficient documentation

## 2022-11-13 DIAGNOSIS — Z20822 Contact with and (suspected) exposure to covid-19: Secondary | ICD-10-CM | POA: Insufficient documentation

## 2022-11-13 DIAGNOSIS — I959 Hypotension, unspecified: Secondary | ICD-10-CM | POA: Diagnosis not present

## 2022-11-13 DIAGNOSIS — E876 Hypokalemia: Secondary | ICD-10-CM | POA: Diagnosis not present

## 2022-11-13 DIAGNOSIS — F17210 Nicotine dependence, cigarettes, uncomplicated: Secondary | ICD-10-CM | POA: Diagnosis not present

## 2022-11-13 DIAGNOSIS — R569 Unspecified convulsions: Secondary | ICD-10-CM | POA: Diagnosis not present

## 2022-11-13 DIAGNOSIS — E872 Acidosis, unspecified: Secondary | ICD-10-CM | POA: Diagnosis not present

## 2022-11-13 HISTORY — DX: Essential (primary) hypertension: I10

## 2022-11-13 HISTORY — DX: Alcohol use, unspecified with withdrawal delirium: F10.931

## 2022-11-13 HISTORY — DX: Accidental discharge from unspecified firearms or gun, initial encounter: W34.00XA

## 2022-11-13 HISTORY — DX: Acquired absence of left leg below knee: Z89.512

## 2022-11-13 HISTORY — DX: Sepsis, unspecified organism: A41.9

## 2022-11-13 LAB — CBC WITH DIFFERENTIAL/PLATELET
Abs Immature Granulocytes: 0 10*3/uL (ref 0.00–0.07)
Basophils Absolute: 0 10*3/uL (ref 0.0–0.1)
Basophils Relative: 0 %
Eosinophils Absolute: 0.2 10*3/uL (ref 0.0–0.5)
Eosinophils Relative: 2 %
HCT: 34.7 % — ABNORMAL LOW (ref 39.0–52.0)
Hemoglobin: 11 g/dL — ABNORMAL LOW (ref 13.0–17.0)
Lymphocytes Relative: 26 %
Lymphs Abs: 3 10*3/uL (ref 0.7–4.0)
MCH: 30.6 pg (ref 26.0–34.0)
MCHC: 31.7 g/dL (ref 30.0–36.0)
MCV: 96.4 fL (ref 80.0–100.0)
Monocytes Absolute: 0.6 10*3/uL (ref 0.1–1.0)
Monocytes Relative: 5 %
Neutro Abs: 7.8 10*3/uL — ABNORMAL HIGH (ref 1.7–7.7)
Neutrophils Relative %: 67 %
Platelets: 150 10*3/uL (ref 150–400)
RBC: 3.6 MIL/uL — ABNORMAL LOW (ref 4.22–5.81)
RDW: 21.1 % — ABNORMAL HIGH (ref 11.5–15.5)
WBC: 11.7 10*3/uL — ABNORMAL HIGH (ref 4.0–10.5)
nRBC: 0 % (ref 0.0–0.2)
nRBC: 0 /100 WBC

## 2022-11-13 LAB — URINALYSIS, ROUTINE W REFLEX MICROSCOPIC
Bilirubin Urine: NEGATIVE
Glucose, UA: NEGATIVE mg/dL
Hgb urine dipstick: NEGATIVE
Ketones, ur: NEGATIVE mg/dL
Leukocytes,Ua: NEGATIVE
Nitrite: NEGATIVE
Protein, ur: NEGATIVE mg/dL
Specific Gravity, Urine: 1.005 (ref 1.005–1.030)
pH: 7 (ref 5.0–8.0)

## 2022-11-13 LAB — COMPREHENSIVE METABOLIC PANEL
ALT: 38 U/L (ref 0–44)
AST: 102 U/L — ABNORMAL HIGH (ref 15–41)
Albumin: 2.5 g/dL — ABNORMAL LOW (ref 3.5–5.0)
Alkaline Phosphatase: 130 U/L — ABNORMAL HIGH (ref 38–126)
Anion gap: 11 (ref 5–15)
BUN: <5 mg/dL — ABNORMAL LOW (ref 6–20)
CO2: 20 mmol/L — ABNORMAL LOW (ref 22–32)
Calcium: 8.5 mg/dL — ABNORMAL LOW (ref 8.9–10.3)
Chloride: 102 mmol/L (ref 98–111)
Creatinine, Ser: 0.69 mg/dL (ref 0.61–1.24)
GFR, Estimated: >60 mL/min (ref >60)
Glucose, Bld: 104 mg/dL — ABNORMAL HIGH (ref 70–99)
Potassium: 3.1 mmol/L — ABNORMAL LOW (ref 3.5–5.1)
Sodium: 133 mmol/L — ABNORMAL LOW (ref 135–145)
Total Bilirubin: 1.1 mg/dL (ref 0.3–1.2)
Total Protein: 7.8 g/dL (ref 6.5–8.1)

## 2022-11-13 LAB — RESP PANEL BY RT-PCR (FLU A&B, COVID) ARPGX2
Influenza A by PCR: NEGATIVE
Influenza B by PCR: NEGATIVE
SARS Coronavirus 2 by RT PCR: NEGATIVE

## 2022-11-13 LAB — LACTIC ACID, PLASMA
Lactic Acid, Venous: 2.5 mmol/L (ref 0.5–1.9)
Lactic Acid, Venous: 1.7 mmol/L (ref 0.5–1.9)

## 2022-11-13 LAB — HIV ANTIBODY (ROUTINE TESTING W REFLEX): HIV Screen 4th Generation wRfx: NONREACTIVE

## 2022-11-13 LAB — CBG MONITORING, ED
Glucose-Capillary: 93 mg/dL (ref 70–99)
Glucose-Capillary: 109 mg/dL — ABNORMAL HIGH (ref 70–99)

## 2022-11-13 LAB — MAGNESIUM: Magnesium: 1.1 mg/dL — ABNORMAL LOW (ref 1.7–2.4)

## 2022-11-13 LAB — PROTIME-INR
INR: 1.2 (ref 0.8–1.2)
Prothrombin Time: 15.2 seconds (ref 11.4–15.2)

## 2022-11-13 LAB — ETHANOL: Alcohol, Ethyl (B): 74 mg/dL — ABNORMAL HIGH (ref <10)

## 2022-11-13 MED ORDER — ACETAMINOPHEN 325 MG PO TABS: 650.0000 mg | ORAL_TABLET | ORAL | Status: DC | PRN

## 2022-11-13 MED ORDER — FOLIC ACID 1 MG PO TABS
1.0000 mg | ORAL_TABLET | Freq: Every day | ORAL | Status: DC
  Administered 2022-11-13: 1 mg via ORAL
  Filled 2022-11-13: qty 1

## 2022-11-13 MED ORDER — ORAL CARE MOUTH RINSE
15.0000 mL | OROMUCOSAL | Status: DC
  Administered 2022-11-13 – 2022-11-14 (×5): 15 mL via OROMUCOSAL

## 2022-11-13 MED ORDER — THIAMINE HCL 100 MG/ML IJ SOLN: 100.0000 mg | Freq: Every day | INTRAMUSCULAR | Status: DC

## 2022-11-13 MED ORDER — LORAZEPAM 2 MG/ML IJ SOLN: 0.0000 mg | Freq: Two times a day (BID) | INTRAMUSCULAR | Status: DC

## 2022-11-13 MED ORDER — MAGIC MOUTHWASH W/LIDOCAINE
5.0000 mL | Freq: Four times a day (QID) | ORAL | Status: DC | PRN
  Filled 2022-11-13: qty 5

## 2022-11-13 MED ORDER — LORAZEPAM 1 MG PO TABS
0.0000 mg | ORAL_TABLET | Freq: Four times a day (QID) | ORAL | Status: DC
  Administered 2022-11-13: 2 mg via ORAL
  Filled 2022-11-13: qty 2

## 2022-11-13 MED ORDER — SODIUM CHLORIDE 0.9 % IV SOLN
INTRAVENOUS | Status: AC
  Filled 2022-11-13 (×2): qty 1000

## 2022-11-13 MED ORDER — DULOXETINE HCL 20 MG PO CPEP
20.0000 mg | ORAL_CAPSULE | Freq: Every day | ORAL | Status: DC
  Filled 2022-11-13: qty 1

## 2022-11-13 MED ORDER — LIDOCAINE VISCOUS HCL 2 % MT SOLN
5.0000 mL | Freq: Four times a day (QID) | OROMUCOSAL | Status: DC | PRN
  Filled 2022-11-13: qty 15

## 2022-11-13 MED ORDER — LORAZEPAM 2 MG/ML IJ SOLN
0.0000 mg | Freq: Four times a day (QID) | INTRAMUSCULAR | Status: DC
  Administered 2022-11-14: 1 mg via INTRAVENOUS
  Filled 2022-11-13: qty 1

## 2022-11-13 MED ORDER — ENOXAPARIN SODIUM 40 MG/0.4ML IJ SOSY
40.0000 mg | PREFILLED_SYRINGE | INTRAMUSCULAR | Status: DC
  Administered 2022-11-13: 40 mg via SUBCUTANEOUS
  Filled 2022-11-13: qty 0.4

## 2022-11-13 MED ORDER — IBUPROFEN 400 MG PO TABS: 600.0000 mg | ORAL_TABLET | Freq: Three times a day (TID) | ORAL | Status: DC | PRN

## 2022-11-13 MED ORDER — METHOCARBAMOL 500 MG PO TABS
750.0000 mg | ORAL_TABLET | Freq: Two times a day (BID) | ORAL | Status: DC
  Administered 2022-11-13: 750 mg via ORAL
  Filled 2022-11-13: qty 2

## 2022-11-13 MED ORDER — SODIUM CHLORIDE 0.9 % IV SOLN: INTRAVENOUS | Status: DC

## 2022-11-13 MED ORDER — ORAL CARE MOUTH RINSE: 15.0000 mL | OROMUCOSAL | Status: DC | PRN

## 2022-11-13 MED ORDER — SODIUM CHLORIDE 0.9 % IV SOLN: 75.0000 mL/h | INTRAVENOUS | Status: DC

## 2022-11-13 MED ORDER — MAGIC MOUTHWASH: 5.0000 mL | Freq: Four times a day (QID) | ORAL | Status: DC | PRN

## 2022-11-13 MED ORDER — THIAMINE MONONITRATE 100 MG PO TABS
100.0000 mg | ORAL_TABLET | Freq: Every day | ORAL | Status: DC
  Administered 2022-11-13: 100 mg via ORAL
  Filled 2022-11-13: qty 1

## 2022-11-13 MED ORDER — LACTATED RINGERS IV BOLUS
1000.0000 mL | Freq: Once | INTRAVENOUS | Status: AC
  Administered 2022-11-13: 1000 mL via INTRAVENOUS

## 2022-11-13 MED ORDER — LORAZEPAM 1 MG PO TABS: 0.0000 mg | ORAL_TABLET | Freq: Two times a day (BID) | ORAL | Status: DC

## 2022-11-13 MED ORDER — ALUM & MAG HYDROXIDE-SIMETH 200-200-20 MG/5ML PO SUSP: 30.0000 mL | Freq: Four times a day (QID) | ORAL | Status: DC | PRN

## 2022-11-13 MED ORDER — POTASSIUM CHLORIDE CRYS ER 20 MEQ PO TBCR
40.0000 meq | EXTENDED_RELEASE_TABLET | Freq: Once | ORAL | Status: AC
  Administered 2022-11-13: 40 meq via ORAL
  Filled 2022-11-13: qty 2

## 2022-11-13 MED ORDER — LEVETIRACETAM 500 MG PO TABS
750.0000 mg | ORAL_TABLET | Freq: Two times a day (BID) | ORAL | Status: DC
  Administered 2022-11-13 (×2): 750 mg via ORAL
  Filled 2022-11-13 (×2): qty 1

## 2022-11-13 NOTE — ED Provider Notes (Signed)
[name]   [id number] 11/13/22 Arrival Time: 0086  ASSESSMENT & PLAN:  1. Hypotension, unspecified hypotension type   2. Tachycardia    Witness DT seizures approx 2 days ago while on a couch at home. No EMS called. Alert and oriented here. Does not appear inebriated. One 40-ounce today; usu 2-3 per day. Reports overall feeling bad; mild cough; several days.  BP (!) 75/49 (BP Location: Left Arm)   Pulse (!) 122   Temp 98.2 F (36.8 C) (Oral)   Resp 18   SpO2 98%  Recheck BP around 90/60.  CBG 93 Cannot r/o sepsis here; discussed. Agrees to ED evaluation. To ED via [place]. IVF started. Stable upon discharge.  Reviewed expectations re: course of current medical issues. Questions answered. Outlined signs and symptoms indicating need for more acute intervention. Patient verbalized understanding. After Visit Summary given.   SUBJECTIVE:  History from: patient. [name] is a 41 y.o. 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 Overall decreased PO intake secondary to tongue pain. No n/v/d. Ambulatory. Upon arrival here noted to be tachycardic and hypotensive. With fatigue. No CP/SOB reported.  Denies recreational drug use.  Social History   Tobacco Use  Smoking Status Every Day   Packs/day: 0.33   Years: 20.00   Total pack years: 6.60   Types: Cigarettes  Smokeless Tobacco Never   Social History   Substance and Sexual Activity  Alcohol Use Yes   Alcohol/week: 2.0 standard drinks of alcohol   Types: 2 Cans of beer per week   Comment: not every day  2-3 40 ounces per day.  OBJECTIVE:  Vitals:   11/13/22 1137  BP: (!) 75/49  Pulse: (!) 122  Resp: 18  Temp: 98.2 F (36.8 C)  TempSrc: Oral  SpO2: 98%    General appearance: alert, oriented, no  acute distress; appears fatigued Eyes: PERRLA; EOMI; conjunctivae normal HENT: normocephalic; atraumatic Neck: supple with FROM Lungs: without labored respirations; speaks full sentences without difficulty; CTAB Heart: regular; tachycardic Abdomen: soft, non-tender; no guarding or rebound tenderness Extremities: LEFT AKA; without edema; without calf swelling or tenderness Skin: warm and dry; without rash or lesions Neuro: normal gait Psychological: alert and cooperative; normal mood and affect    No Known Allergies  Past Medical History:  Diagnosis Date   Alcohol abuse    Seizures (HCC)    Stroke (HCC)    Social History   Socioeconomic History   Marital status: Single    Spouse name: Not on file   Number of children: Not on file   Years of education: Not on file   Highest education level: Not on file  Occupational History   Not on file  Tobacco Use   Smoking status: Every Day    Packs/day: 0.33    Years: 20.00    Total pack years: 6.60    Types: Cigarettes   Smokeless tobacco: Never  Substance and Sexual Activity   Alcohol use: Yes    Alcohol/week: 2.0 standard drinks of alcohol    Types: 2 Cans of beer per week    Comment: not every day   Drug use: No   Sexual activity: Not Currently  Other Topics Concern   Not on file  Social History Narrative   ** Merged History Encounter **  Social Determinants of Health   Financial Resource Strain: Not on file  Food Insecurity: Not on file  Transportation Needs: Not on file  Physical Activity: Not on file  Stress: Not on file  Social Connections: Not on file  Intimate Partner Violence: Not on file   Family History  Problem Relation Age of Onset   Heart attack Mother        Negative Hx   Past Surgical History:  Procedure Laterality Date   Above knee amputation of left leg     BELOW KNEE LEG AMPUTATION     LEFT   below the knee amputation     INTRAMEDULLARY (IM) NAIL INTERTROCHANTERIC Left 05/25/2020    Procedure: INTRAMEDULLARY (IM) NAIL INTERTROCHANTRIC;  Surgeon: Shona Needles, MD;  Location: [place];  Service: Orthopedics;  Laterality: Left;      Vanessa Kick, MD 11/13/22 1156

## 2022-11-13 NOTE — ED Triage Notes (Signed)
Pt presented to UC for c/o tongue pain. Per EMS pt had a seizure approx 3 days ago and bit his tongue at that time. Reports no PO intake since seizure due to tongue pain. VSS now stable.

## 2022-11-13 NOTE — ED Notes (Signed)
Patient is being discharged from the Urgent Care and sent to the Emergency Department via ambulance . Per dr hagler, patient is in need of higher level of care due to hypotension. Patient is aware and verbalizes understanding of plan of care.  Vitals:   11/13/22 1137  BP: (!) 75/49  Pulse: (!) 122  Resp: 18  Temp: 98.2 F (36.8 C)  SpO2: 98%

## 2022-11-13 NOTE — ED Notes (Signed)
Patient went to urgent care with c/o tongue pain, found to be tachycardic and hypotensive. [facility] dispatched, administered 1L NS. Pt denies c/o. VS WNL on arrival to ED.

## 2022-11-13 NOTE — ED Triage Notes (Signed)
Pt here for tongue pain after injuring during seizure 2 days ago; pt c/o pain in left stump x 2 days; pt noted hypotensive
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
## 2022-11-13 NOTE — ED Notes (Signed)
Attempted to reach charge at [facility] ED to give report on patient arriving. No answer.

## 2022-11-13 NOTE — ED Provider Notes (Signed)
[place] EMERGENCY DEPARTMENT Provider Note   CSN: [id number] Arrival date & time: 11/13/22  1230     History  Chief Complaint  Patient presents with   Hypotension    Sent from UC via [facility] for BP 70s/40s, HR 120s.     [name] is a 41 y.o. male.  HPI   Patient with medical history of left BKA, hypertension, alcohol use disorder complicated by DTs and seizures presents to the emergency department due to hypotension.  Patient had an alcohol withdrawal seizure 2 days ago which was witnessed by family members.  States he normally drinks greater than 5 beers a day and some liquor, he has been cutting back.  He bit his tongue, has not been eating or drinking for the past 3 days secondary to pain.  Was seen in urgent care initially for pain due to the tongue bite.  Was found to be hypotensive at 74/43 and sent to ED for sepsis work-up.  Patient states he had 1 beer prior to arrival.  He endorses nonproductive cough.  Denies any chest pain, shortness of breath, fevers, abdominal pain, nausea, diarrhea, vomiting, dark or tarry stool, bleeding in his rectum, history of GI bleeds or varices, hematemesis.  Home Medications Prior to Admission medications   Medication Sig Start Date End Date Taking? Authorizing Provider  DULoxetine (CYMBALTA) 20 MG capsule Take 1 capsule (20 mg total) by mouth daily. 11/19/21   Dorna Mai, MD  folic acid (FOLVITE) 1 MG tablet TAKE 1 TABLET(1 MG) BY MOUTH DAILY 07/23/22   Dorna Mai, MD  ibuprofen (ADVIL) 600 MG tablet Take 1 tablet (600 mg total) by mouth every 8 (eight) hours as needed (pain). 09/07/22   Barrett Henle, MD  levETIRAcetam (KEPPRA) 750 MG tablet Take 1 tablet (750 mg total) by mouth 2 (two) times daily. 11/19/21   Dorna Mai, MD  methocarbamol (ROBAXIN) 750 MG tablet Take 1 tablet (750 mg total) by mouth 2 (two) times daily. 11/19/21   Dorna Mai, MD  mupirocin ointment (BACTROBAN) 2 % Apply 1  application topically daily. 12/02/21   Raspet, Derry Skill, PA-C  sildenafil (VIAGRA) 100 MG tablet Take 1 tablet (100 mg total) by mouth daily as needed for erectile dysfunction. 11/19/21   Dorna Mai, MD  sildenafil (VIAGRA) 50 MG tablet Take 1 tablet (50 mg total) by mouth daily as needed for erectile dysfunction. 11/23/21   Dorna Mai, MD  Vitamin D, Ergocalciferol, (DRISDOL) 1.25 MG (50000 UNIT) CAPS capsule TAKE 1 CAPSULE BY MOUTH EVERY 7 DAYS 02/18/22   Dorna Mai, MD      Allergies    Patient has no known allergies.    Review of Systems   Review of Systems  Physical Exam Updated Vital Signs BP (!) 117/91   Pulse 78   Temp 98 F (36.7 C) (Oral)   Resp 19   Ht _0  (1.727 m)   Wt 75 kg   SpO2 100%   BMI 25.14 kg/m  Physical Exam Vitals and nursing note reviewed. Exam conducted with a chaperone present.  Constitutional:      Appearance: Normal appearance. He is ill-appearing.  HENT:     Head: Normocephalic and atraumatic.     Mouth/Throat:     Comments: Tongue contusion Eyes:     General: No scleral icterus.       Right eye: No discharge.        Left eye: No discharge.     Extraocular  Movements: Extraocular movements intact.     Pupils: Pupils are equal, round, and reactive to light.  Cardiovascular:     Rate and Rhythm: Normal rate and regular rhythm.     Pulses: Normal pulses.     Heart sounds: Normal heart sounds. No murmur heard.    No friction rub. No gallop.  Pulmonary:     Effort: Pulmonary effort is normal. No respiratory distress.     Breath sounds: Normal breath sounds.  Abdominal:     General: Abdomen is flat. Bowel sounds are normal. There is no distension.     Palpations: Abdomen is soft.     Tenderness: There is no abdominal tenderness.  Musculoskeletal:     Comments: Left AKA  Skin:    General: Skin is warm and dry.     Coloration: Skin is not jaundiced.  Neurological:     Mental Status: He is alert. Mental status is at baseline.      Coordination: Coordination normal.     Comments: Tremulous      ED Results / Procedures / Treatments   Labs (all labs ordered are listed, but only abnormal results are displayed) Labs Reviewed  COMPREHENSIVE METABOLIC PANEL - Abnormal; Notable for the following components:      Result Value   Sodium 133 (*)    Potassium 3.1 (*)    CO2 20 (*)    Glucose, Bld 104 (*)    BUN <5 (*)    Calcium 8.5 (*)    Albumin 2.5 (*)    AST 102 (*)    Alkaline Phosphatase 130 (*)    All other components within normal limits  CBC WITH DIFFERENTIAL/PLATELET - Abnormal; Notable for the following components:   WBC 11.7 (*)    RBC 3.60 (*)    Hemoglobin 11.0 (*)    HCT 34.7 (*)    RDW 21.1 (*)    Neutro Abs 7.8 (*)    All other components within normal limits  LACTIC ACID, PLASMA - Abnormal; Notable for the following components:   Lactic Acid, Venous 2.5 (*)    All other components within normal limits  ETHANOL - Abnormal; Notable for the following components:   Alcohol, Ethyl (B) 74 (*)    All other components within normal limits  CULTURE, BLOOD (ROUTINE X 2)  CULTURE, BLOOD (ROUTINE X 2)  RESP PANEL BY RT-PCR (FLU A&B, COVID) ARPGX2  URINALYSIS, ROUTINE W REFLEX MICROSCOPIC  LACTIC ACID, PLASMA  LEVETIRACETAM LEVEL  PROTIME-INR  CBG MONITORING, ED    EKG None  Radiology DG Chest Portable 1 View  Result Date: 11/13/2022 CLINICAL DATA:  Chest pain.  Hypotension. EXAM: PORTABLE CHEST 1 VIEW COMPARISON:  July 08, 2021 FINDINGS: The heart size and mediastinal contours are within normal limits. Both lungs are clear. The visualized skeletal structures are unremarkable. IMPRESSION: No active disease. Electronically Signed   By: Dorise Bullion III M.D.   On: 11/13/2022 13:22    Procedures Procedures    Medications Ordered in ED Medications  LORazepam (ATIVAN) injection 0-4 mg (has no administration in time range)    Or  LORazepam (ATIVAN) tablet 0-4 mg (has no administration in time  range)  LORazepam (ATIVAN) injection 0-4 mg (has no administration in time range)    Or  LORazepam (ATIVAN) tablet 0-4 mg (has no administration in time range)  thiamine (VITAMIN B1) tablet 100 mg (has no administration in time range)    Or  thiamine (VITAMIN B1) injection 100 mg (  has no administration in time range)  alum & mag hydroxide-simeth (MAALOX/MYLANTA) 200-200-20 MG/5ML suspension 30 mL (has no administration in time range)  acetaminophen (TYLENOL) tablet 650 mg (has no administration in time range)  lactated ringers bolus 1,000 mL (0 mLs Intravenous Stopped 11/13/22 1414)  lactated ringers bolus 1,000 mL (1,000 mLs Intravenous New Bag/Given 11/13/22 1433)    ED Course/ Medical Decision Making/ A&P Clinical Course as of 11/13/22 1508  Sat Nov 13, 2022  1405 EKG 12-Lead Sinus rhythm [HS]  1405 Patient is on cardiac monitoring in sinus rhythm with a rate of 67. [HS]  1610 DG Chest Portable 1 View No acute or infectious process [HS]  1405 Ethanol(!) Detectable at 74 consistent with patient's story of alcohol consumption prior to arrival [HS]  1405 Lactic acid, plasma(!!) Elevated at 2.5, suspect secondary to alcohol usage.  Second lactic acid will be collected. [HS]  9604 CBC with Differential(!) Leukocytosis of 11.7, left shift.  Mild anemia with a hemoglobin of 11 [HS]  1406 Comprehensive metabolic panel(!) Slightly hypokalemic with a potassium of 3.1, slight transaminitis AST is 102, alk phos 130. [HS]    Clinical Course User Index [HS] Sherrill Raring, PA-C                           Medical Decision Making Amount and/or Complexity of Data Reviewed Labs: ordered. Decision-making details documented in ED Course. Radiology: ordered. Decision-making details documented in ED Course. ECG/medicine tests:  Decision-making details documented in ED Course.  Risk OTC drugs. Prescription drug management. Decision regarding hospitalization.   Patient is a 41 year old male with  history of alcohol use disorder complicated by previous seizures and DTs presenting to the ED from urgent care due to hypotension.  Differential includes limited to acute alcohol withdrawal, symptomatic anemia, GI bleed, sepsis although less likely given afebrile without an obvious source, metabolic abnormality, AKI, dehydration.  I ordered, viewed and interpreted laboratory work-up as documented in the ED course.  I ordered, viewed and interpreted imaging studies as documented in the ED course.  I viewed external medical records including urgent care note from earlier today where he was hypotensive with blood pressure systolically in the 54U.   I ordered 2 L lactated ringer fluid bolus.  Patient received 1 L normal saline in route by EMS.  Initially did consider sending the patient home.  However patient has been very symptomatically orthostatic, he actively had seizures in the last few days and has a lactic acid of 2.5.  Concern about impending alcohol withdrawal, initially in the context of alcohol withdrawal seizures.  I will consult hospitalist service for admission.  I consulted with hospitalist service spoke with Dr. Roosevelt Locks who agrees with admission.  CIWA protocol initiated.         Final Clinical Impression(s) / ED Diagnoses Final diagnoses:  Hypotension, unspecified hypotension type  Lactic acidosis  Alcohol withdrawal seizure without complication [facility])    Rx / DC Orders ED Discharge Orders     None         Sherrill Raring, Hershal Coria 11/13/22 1508    Lacretia Leigh, MD 11/14/22 0930

## 2022-11-14 DIAGNOSIS — R569 Unspecified convulsions: Secondary | ICD-10-CM | POA: Diagnosis not present

## 2022-11-14 MED ORDER — CHLORASEPTIC WARM SORE THROAT 1.4 % MT LIQD: 1.0000 | OROMUCOSAL | 0 refills | Status: DC | PRN

## 2022-11-14 MED ORDER — MULTI-VITAMIN/MINERALS PO TABS: 1.0000 | ORAL_TABLET | Freq: Every day | ORAL | 2 refills | Status: AC

## 2022-11-14 MED ORDER — FOLIC ACID 1 MG PO TABS: 1.0000 mg | ORAL_TABLET | Freq: Every day | ORAL | 0 refills | Status: DC

## 2022-11-14 MED ORDER — LEVETIRACETAM 750 MG PO TABS: 750.0000 mg | ORAL_TABLET | Freq: Two times a day (BID) | ORAL | 1 refills | Status: DC

## 2022-11-14 MED ORDER — VITAMIN B-1 100 MG PO TABS: 100.0000 mg | ORAL_TABLET | Freq: Every day | ORAL | 1 refills | Status: DC

## 2022-11-14 NOTE — ED Notes (Signed)
Patient eating breakfast. °

## 2022-11-14 NOTE — ED Notes (Signed)
Patient left ambulatory, in stable condition, AOX4, with his belongings, discharge papers and his belongings.

## 2022-11-14 NOTE — Discharge Summary (Signed)
Physician Discharge Summary  Aristides Luckey YIF:[id number] DOB: June 25, 1981 DOA: 11/13/2022  PCP: Patient, No Pcp Per  Admit date: 11/13/2022 Discharge date: 11/14/2022  Admitted From: Home Disposition: Home  Recommendations for Outpatient Follow-up:  Follow up with PCP in 1-2 weeks Follow-up with neurology as scheduled  Home Health: None Equipment/Devices: None  Discharge Condition: Stable CODE STATUS: Full Diet recommendation: Low-salt low-fat diet  Brief/Interim Summary: Ranger Petrich is a 41 y.o. male with medical history significant of alcohol abuse, seizure disorder, chronic HFrEF with LVEF 45-50% 2021, came with seizure at home and generalized weakness.   Patient admitted for procedure observation in the setting of noncompliance with home medications as well as alcohol use and possible withdrawal.  Patient's tongue continues to be somewhat painful but well-controlled on over-the-counter NSAIDs.  Discussed with patient need for twice daily Keppra as previously prescribed as he is only been taking it once a day.  Otherwise patient's symptoms have resolved denies nausea vomiting diarrhea constipation headache fevers chills or chest pain.  Otherwise stable and agreeable for discharge home.  Discharge Diagnoses:  Principal Problem:   Seizure ([place])  Acute breakthrough seizure secondary to noncompliance -Resume home Keppra twice daily -Follow-up with neurology as scheduled   Lactic acidosis -In the setting of poor p.o. intake, tolerating p.o. well now   Hypokalemia Hypoalbuminemia Hypomagnesemia -Continue increased p.o. intake as tolerated   Hypoalbuminemia -Suspect at least early cirrhosis, as most recent RUQ ultrasound showed increased echogenicity and contour changes   Acute, probably on chronic transaminitis Chronic alcohol use/abuse -Likely from alcohol abuse -Conservative management  Chronic HFrEF -Volume contraction and dehydration, increase p.o. intake,  low-salt cardiac diet  Discharge Instructions   Allergies as of 11/14/2022   No Known Allergies      Medication List     STOP taking these medications    amoxicillin-clavulanate 875-125 MG tablet Commonly known as: AUGMENTIN   methocarbamol 750 MG tablet Commonly known as: ROBAXIN   mupirocin ointment 2 % Commonly known as: BACTROBAN   sildenafil 100 MG tablet Commonly known as: Viagra   sildenafil 50 MG tablet Commonly known as: Viagra       TAKE these medications    DULoxetine 20 MG capsule Commonly known as: Cymbalta Take 1 capsule (20 mg total) by mouth daily.   folic acid 1 MG tablet Commonly known as: FOLVITE Take 1 tablet (1 mg total) by mouth daily. TAKE 1 TABLET(1 MG) BY MOUTH DAILY Strength: 1 mg What changed: See the new instructions.   ibuprofen 600 MG tablet Commonly known as: ADVIL Take 1 tablet (600 mg total) by mouth every 8 (eight) hours as needed (pain).   levETIRAcetam 750 MG tablet Commonly known as: Keppra Take 1 tablet (750 mg total) by mouth 2 (two) times daily.   multivitamin with minerals tablet Take 1 tablet by mouth daily.   thiamine 100 MG tablet Commonly known as: Vitamin B-1 Take 1 tablet (100 mg total) by mouth daily.   Vitamin D (Ergocalciferol) 1.25 MG (50000 UNIT) Caps capsule Commonly known as: DRISDOL TAKE 1 CAPSULE BY MOUTH EVERY 7 DAYS What changed: additional instructions        No Known Allergies  Consultations: Neurology  Procedures/Studies: DG Chest Portable 1 View  Result Date: 11/13/2022 CLINICAL DATA:  Chest pain.  Hypotension. EXAM: PORTABLE CHEST 1 VIEW COMPARISON:  July 08, 2021 FINDINGS: The heart size and mediastinal contours are within normal limits. Both lungs are clear. The visualized skeletal structures are unremarkable. IMPRESSION: No active  disease. Electronically Signed   By: Dorise Bullion III M.D.   On: 11/13/2022 13:22    Subjective: No acute issues or events overnight denies  nausea vomiting diarrhea constipation headache fevers chills or chest pain  Discharge Exam: Vitals:   11/14/22 0715 11/14/22 1000  BP: 110/70 123/85  Pulse:    Resp: (!) 9 16  Temp:  98.6 F (37 C)  SpO2:  99%   Vitals:   11/14/22 0645 11/14/22 0700 11/14/22 0715 11/14/22 1000  BP: 113/81 109/75 110/70 123/85  Pulse: 71 67    Resp: 12 17 (!) 9 16  Temp:    98.6 F (37 C)  TempSrc:      SpO2: 99% 100%  99%  Weight:      Height:       General: Pt is alert, awake, not in acute distress Cardiovascular: RRR, S1/S2 +, no rubs, no gallops Respiratory: CTA bilaterally, no wheezing, no rhonchi Abdominal: Soft, NT, ND, bowel sounds + Extremities: no edema, no cyanosis  The results of significant diagnostics from this hospitalization (including imaging, microbiology, ancillary and laboratory) are listed below for reference.     Microbiology: Recent Results (from the past 240 hour(s))  Blood culture (routine x 2)     Status: None (Preliminary result)   Collection Time: 11/13/22  1:07 PM   Specimen: BLOOD RIGHT HAND  Result Value Ref Range Status   Specimen Description BLOOD RIGHT HAND  Final   Special Requests   Final    BOTTLES DRAWN AEROBIC AND ANAEROBIC Blood Culture results may not be optimal due to an inadequate volume of blood received in culture bottles   Culture   Final    NO GROWTH < 24 HOURS Performed at [facility], [place] [address], [place], [place] [address]    Report Status PENDING  Incomplete  Blood culture (routine x 2)     Status: None (Preliminary result)   Collection Time: 11/13/22  1:10 PM   Specimen: BLOOD  Result Value Ref Range Status   Specimen Description BLOOD SITE NOT SPECIFIED  Final   Special Requests   Final    BOTTLES DRAWN AEROBIC AND ANAEROBIC Blood Culture adequate volume   Culture   Final    NO GROWTH < 24 HOURS Performed at [facility], [place] [address], [place], [place] [address]    Report Status PENDING  Incomplete  Resp  Panel by RT-PCR (Flu A&B, Covid) Anterior Nasal Swab     Status: None   Collection Time: 11/13/22  1:55 PM   Specimen: Anterior Nasal Swab  Result Value Ref Range Status   SARS Coronavirus 2 by RT PCR NEGATIVE NEGATIVE Final    Comment: (NOTE) SARS-CoV-2 target nucleic acids are NOT DETECTED.  The SARS-CoV-2 RNA is generally detectable in upper respiratory specimens during the acute phase of infection. The lowest concentration of SARS-CoV-2 viral copies this assay can detect is 138 copies/mL. A negative result does not preclude SARS-Cov-2 infection and should not be used as the sole basis for treatment or other patient management decisions. A negative result may occur with  improper specimen collection/handling, submission of specimen other than nasopharyngeal swab, presence of viral mutation(s) within the areas targeted by this assay, and inadequate number of viral copies(<138 copies/mL). A negative result must be combined with clinical observations, patient history, and epidemiological information. The expected result is Negative.  Fact Sheet for Patients:  EntrepreneurPulse.com.au  Fact Sheet for Healthcare Providers:  IncredibleEmployment.be  This test is no t yet approved or cleared by the [place] and  has been authorized for detection and/or diagnosis of SARS-CoV-2 by FDA under an Emergency Use Authorization (EUA). This EUA will remain  in effect (meaning this test can be used) for the duration of the COVID-19 declaration under Section 564(b)(1) of the Act, 21 U.S.C.section 360bbb-3(b)(1), unless the authorization is terminated  or revoked sooner.       Influenza A by PCR NEGATIVE NEGATIVE Final   Influenza B by PCR NEGATIVE NEGATIVE Final    Comment: (NOTE) The Xpert Xpress SARS-CoV-2/FLU/RSV plus assay is intended as an aid in the diagnosis of influenza from Nasopharyngeal swab specimens and should not be used as a sole  basis for treatment. Nasal washings and aspirates are unacceptable for Xpert Xpress SARS-CoV-2/FLU/RSV testing.  Fact Sheet for Patients: EntrepreneurPulse.com.au  Fact Sheet for Healthcare Providers: IncredibleEmployment.be  This test is not yet approved or cleared by the [place] FDA and has been authorized for detection and/or diagnosis of SARS-CoV-2 by FDA under an Emergency Use Authorization (EUA). This EUA will remain in effect (meaning this test can be used) for the duration of the COVID-19 declaration under Section 564(b)(1) of the Act, 21 U.S.C. section 360bbb-3(b)(1), unless the authorization is terminated or revoked.  Performed at [facility], [place] [address] [place], [place] [address]      Labs: BNP (last 3 results) No results for input(s): "BNP" in the last 8760 hours. Basic Metabolic Panel: Recent Labs  Lab 11/13/22 1307 11/13/22 1926  NA 133*  --   K 3.1*  --   CL 102  --   CO2 20*  --   GLUCOSE 104*  --   BUN <5*  --   CREATININE 0.69  --   CALCIUM 8.5*  --   MG  --  1.1*   Liver Function Tests: Recent Labs  Lab 11/13/22 1307  AST 102*  ALT 38  ALKPHOS 130*  BILITOT 1.1  PROT 7.8  ALBUMIN 2.5*   No results for input(s): "LIPASE", "AMYLASE" in the last 168 hours. No results for input(s): "AMMONIA" in the last 168 hours. CBC: Recent Labs  Lab 11/13/22 1307  WBC 11.7*  NEUTROABS 7.8*  HGB 11.0*  HCT 34.7*  MCV 96.4  PLT 150   Cardiac Enzymes: No results for input(s): "CKTOTAL", "CKMB", "CKMBINDEX", "TROPONINI" in the last 168 hours. BNP: Invalid input(s): "POCBNP" CBG: Recent Labs  Lab 11/13/22 1152 11/13/22 1830  GLUCAP 93 109*   D-Dimer No results for input(s): "DDIMER" in the last 72 hours. Hgb A1c No results for input(s): "HGBA1C" in the last 72 hours. Lipid Profile No results for input(s): "CHOL", "HDL", "LDLCALC", "TRIG", "CHOLHDL", "LDLDIRECT" in the last 72  hours. Thyroid function studies No results for input(s): "TSH", "T4TOTAL", "T3FREE", "THYROIDAB" in the last 72 hours.  Invalid input(s): "FREET3" Anemia work up No results for input(s): "VITAMINB12", "FOLATE", "FERRITIN", "TIBC", "IRON", "RETICCTPCT" in the last 72 hours. Urinalysis    Component Value Date/Time   COLORURINE YELLOW 11/13/2022 1400   APPEARANCEUR CLEAR 11/13/2022 1400   LABSPEC 1.005 11/13/2022 1400   PHURINE 7.0 11/13/2022 1400   GLUCOSEU NEGATIVE 11/13/2022 1400   HGBUR NEGATIVE 11/13/2022 1400   BILIRUBINUR NEGATIVE 11/13/2022 1400   KETONESUR NEGATIVE 11/13/2022 1400   PROTEINUR NEGATIVE 11/13/2022 1400   UROBILINOGEN 0.2 01/27/2013 0727   NITRITE NEGATIVE 11/13/2022 1400   LEUKOCYTESUR NEGATIVE 11/13/2022 1400   Sepsis Labs Recent Labs  Lab  11/13/22 1307  WBC 11.7*   Microbiology Recent Results (from the past 240 hour(s))  Blood culture (routine x 2)     Status: None (Preliminary result)   Collection Time: 11/13/22  1:07 PM   Specimen: BLOOD RIGHT HAND  Result Value Ref Range Status   Specimen Description BLOOD RIGHT HAND  Final   Special Requests   Final    BOTTLES DRAWN AEROBIC AND ANAEROBIC Blood Culture results may not be optimal due to an inadequate volume of blood received in culture bottles   Culture   Final    NO GROWTH < 24 HOURS Performed at [facility], [place] [address], [place], [place] [address]    Report Status PENDING  Incomplete  Blood culture (routine x 2)     Status: None (Preliminary result)   Collection Time: 11/13/22  1:10 PM   Specimen: BLOOD  Result Value Ref Range Status   Specimen Description BLOOD SITE NOT SPECIFIED  Final   Special Requests   Final    BOTTLES DRAWN AEROBIC AND ANAEROBIC Blood Culture adequate volume   Culture   Final    NO GROWTH < 24 HOURS Performed at [facility], [place] [address], [place], [place] [address]    Report Status PENDING  Incomplete  Resp Panel by RT-PCR (Flu A&B, Covid)  Anterior Nasal Swab     Status: None   Collection Time: 11/13/22  1:55 PM   Specimen: Anterior Nasal Swab  Result Value Ref Range Status   SARS Coronavirus 2 by RT PCR NEGATIVE NEGATIVE Final    Comment: (NOTE) SARS-CoV-2 target nucleic acids are NOT DETECTED.  The SARS-CoV-2 RNA is generally detectable in upper respiratory specimens during the acute phase of infection. The lowest concentration of SARS-CoV-2 viral copies this assay can detect is 138 copies/mL. A negative result does not preclude SARS-Cov-2 infection and should not be used as the sole basis for treatment or other patient management decisions. A negative result may occur with  improper specimen collection/handling, submission of specimen other than nasopharyngeal swab, presence of viral mutation(s) within the areas targeted by this assay, and inadequate number of viral copies(<138 copies/mL). A negative result must be combined with clinical observations, patient history, and epidemiological information. The expected result is Negative.  Fact Sheet for Patients:  EntrepreneurPulse.com.au  Fact Sheet for Healthcare Providers:  IncredibleEmployment.be  This test is no t yet approved or cleared by the [place] FDA and  has been authorized for detection and/or diagnosis of SARS-CoV-2 by FDA under an Emergency Use Authorization (EUA). This EUA will remain  in effect (meaning this test can be used) for the duration of the COVID-19 declaration under Section 564(b)(1) of the Act, 21 U.S.C.section 360bbb-3(b)(1), unless the authorization is terminated  or revoked sooner.       Influenza A by PCR NEGATIVE NEGATIVE Final   Influenza B by PCR NEGATIVE NEGATIVE Final    Comment: (NOTE) The Xpert Xpress SARS-CoV-2/FLU/RSV plus assay is intended as an aid in the diagnosis of influenza from Nasopharyngeal swab specimens and should not be used as a sole basis for treatment. Nasal washings  and aspirates are unacceptable for Xpert Xpress SARS-CoV-2/FLU/RSV testing.  Fact Sheet for Patients: EntrepreneurPulse.com.au  Fact Sheet for Healthcare Providers: IncredibleEmployment.be  This test is not yet approved or cleared by the [place] FDA and has been authorized for detection and/or diagnosis of SARS-CoV-2 by FDA under an Emergency Use Authorization (EUA). This EUA will remain in effect (  meaning this test can be used) for the duration of the COVID-19 declaration under Section 564(b)(1) of the Act, 21 U.S.C. section 360bbb-3(b)(1), unless the authorization is terminated or revoked.  Performed at [facility], [place] [address], [place], [place] [address]      Time coordinating discharge: Over 30 minutes  SIGNED:   Little Ishikawa, DO [facility] 11/14/2022, 10:31 AM Pager   If 7PM-7AM, please contact night-coverage [email or web address]

## 2022-11-14 NOTE — ED Notes (Signed)
Patient did not want to take his morning medications, only wanted his breakfast and a discharge.

## 2022-11-14 NOTE — Discharge Instructions (Signed)
                  Intensive Outpatient Programs  [facility]    [facility] [address]     [address] [place],  [place]     [place], [place] [phone]      [phone]  [facility]   [facility]  (Inpatient and outpatient)  [phone] (Suboxone and Methadone) [address]           [phone]           [facility]    [facility] - Intensive Outpatient [address]     [address] [place], [place] [address]     [place], [place]  [phone]      [phone]  [facility] (Outpatient, Inpatient, [facility]  [facility] (Groups and Residental) (insurance only) [phone]    [place], [place]          [phone]       [facility]    [facility] (for caregivers and family) [address]     [address] [place], [place]     [place], [place] [phone]      [phone]  Residential Treatment Programs  [facility]  [facility](2 years) Residential: 90 days)  [facility] [address]      [address] [place], [place]     [place], [place] [phone]      [phone] or [phone]  [facility]    [facility] [address]      [address] [place], [place]     [place], [place] [phone]      [phone]  [facility]   [facility] ([facility]) [address]     [address] [place], [place] [address]     [place], [place] [phone]      [phone] Admissions: 8am-3pm M-F  [facility]: Residential Program (90 Days)              [facility]: [facility]  [place], [place]     [place], [place]  [phone] or [phone]    (Walk in Hours over the weekend or by referral)   [place]: [facility]:[phone] (for crisis  response 24 hours a day) 

## 2022-11-15 ENCOUNTER — Telehealth: Payer: Self-pay

## 2022-11-15 LAB — LEVETIRACETAM LEVEL: Levetiracetam Lvl: 3.2 ug/mL — ABNORMAL LOW (ref 10.0–40.0)

## 2022-11-15 NOTE — Telephone Encounter (Signed)
Letter mailed to home address

## 2022-11-15 NOTE — Telephone Encounter (Signed)
Transition Care Management Unsuccessful Follow-up Telephone Call  Date of discharge and from where:  [place] 11-14-22 Dx: hypotension   Attempts:  1st Attempt  Reason for unsuccessful TCM follow-up call:  Left voice message   Juanda Crumble LPN [place] Direct Dial [phone]

## 2022-11-15 NOTE — Telephone Encounter (Signed)
Patient calling back and would like to speak with a nurse regarding his medications prescribed in the hospital, patient states he only has levETIRAcetam (KEPPRA) 750 MG tablet  and states they should be other medications. Chart reflects 4 other medications and states he does not physically have a hard copy and pharmacy declines having these medications. Patient states he is relying on transportation and would like a follow up call as soon as possible.

## 2022-11-15 NOTE — Telephone Encounter (Signed)
Patient returning call and would like a call back. Patient stated he received a prescription for levETIRAcetam (KEPPRA) 750 MG tablet  only and all other medications were OTC, patient would like to discuss further.

## 2022-11-15 NOTE — Telephone Encounter (Signed)
Patient called to discuss the medications. He says it's 4 that the pharmacy said they are OTC. I advised MVI, Folic Acid, Chloraseptic spray and Thiamine are all OTC, so he will have to buy them. He verbalized understanding. Also advised no appointment to see provider since January, 2023, so he will need an OV, he agreed, scheduled for 12/02/22 with Dr. Redmond Pulling. He asks if a text could be sent to his phone or a letter mailed to his home with the appointment because no one was around him to write the appointment down. I advised I will make a note of it.

## 2022-11-16 NOTE — Telephone Encounter (Signed)
Chart opened in error

## 2022-11-18 LAB — CULTURE, BLOOD (ROUTINE X 2)
Culture: NO GROWTH
Culture: NO GROWTH
Special Requests: ADEQUATE

## 2022-11-21 ENCOUNTER — Ambulatory Visit (HOSPITAL_COMMUNITY)
Admission: EM | Admit: 2022-11-21 | Discharge: 2022-11-21 | Disposition: A | Discharge status: Home / Self Care | Payer: Medicaid Other | Attending: Physician Assistant | Admitting: Physician Assistant

## 2022-11-21 ENCOUNTER — Encounter (HOSPITAL_COMMUNITY): Payer: Self-pay | Admitting: *Deleted

## 2022-11-21 DIAGNOSIS — H02824 Cysts of left upper eyelid: Secondary | ICD-10-CM

## 2022-11-21 DIAGNOSIS — T879 Unspecified complications of amputation stump: Secondary | ICD-10-CM | POA: Diagnosis not present

## 2022-11-21 DIAGNOSIS — H02821 Cysts of right upper eyelid: Secondary | ICD-10-CM

## 2022-11-21 DIAGNOSIS — H02826 Cysts of left eye, unspecified eyelid: Secondary | ICD-10-CM

## 2022-11-21 MED ORDER — TIZANIDINE HCL 4 MG PO TABS: 4.0000 mg | ORAL_TABLET | Freq: Three times a day (TID) | ORAL | 0 refills | Status: DC

## 2022-11-21 MED ORDER — SULFAMETHOXAZOLE-TRIMETHOPRIM 800-160 MG PO TABS: 1.0000 | ORAL_TABLET | Freq: Two times a day (BID) | ORAL | 0 refills | Status: AC
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
## 2022-11-21 NOTE — Discharge Instructions (Addendum)
Advised to call [facility] ophthalmology for evaluation and upper eyelid cysts.  Their number is [phone]. Address: [address]  [place], [place]  [address].  Advised to use warm compresses frequently to the area at least 4-5 times throughout the day to help cyst localized. Advised take antibiotic as directed. Advised to follow-up with PCP or return here as needed.

## 2022-11-21 NOTE — ED Triage Notes (Addendum)
Pt reports intermittent swelling to area below bilat eyebrows "for the last yr". Pt showed photo of large amount of swelling to area & describes having I&D performed. C/O intense pruritus to areas. Denies any vision problems.  Also c/o bumps on back that aren't causing any sxs; states sibling noticed few dark areas; few large blackheads noted.

## 2022-12-02 ENCOUNTER — Ambulatory Visit: Payer: Medicaid Other | Admitting: Family Medicine

## 2023-02-01 ENCOUNTER — Emergency Department (HOSPITAL_COMMUNITY): Payer: Medicaid Other

## 2023-02-01 ENCOUNTER — Encounter (HOSPITAL_COMMUNITY): Payer: Self-pay

## 2023-02-01 ENCOUNTER — Emergency Department (HOSPITAL_COMMUNITY)
Admission: EM | Admit: 2023-02-01 | Discharge: 2023-02-01 | Disposition: A | Discharge status: Home / Self Care | Payer: Medicaid Other | Attending: Emergency Medicine | Admitting: Emergency Medicine

## 2023-02-01 DIAGNOSIS — R569 Unspecified convulsions: Secondary | ICD-10-CM | POA: Diagnosis present

## 2023-02-01 DIAGNOSIS — F10120 Alcohol abuse with intoxication, uncomplicated: Secondary | ICD-10-CM | POA: Insufficient documentation

## 2023-02-01 DIAGNOSIS — Z8673 Personal history of transient ischemic attack (TIA), and cerebral infarction without residual deficits: Secondary | ICD-10-CM | POA: Diagnosis not present

## 2023-02-01 DIAGNOSIS — I10 Essential (primary) hypertension: Secondary | ICD-10-CM | POA: Diagnosis not present

## 2023-02-01 DIAGNOSIS — F10920 Alcohol use, unspecified with intoxication, uncomplicated: Secondary | ICD-10-CM

## 2023-02-01 DIAGNOSIS — Y908 Blood alcohol level of 240 mg/100 ml or more: Secondary | ICD-10-CM | POA: Diagnosis not present

## 2023-02-01 LAB — CBC WITH DIFFERENTIAL/PLATELET
Abs Immature Granulocytes: 0 10*3/uL (ref 0.00–0.07)
Basophils Absolute: 0 10*3/uL (ref 0.0–0.1)
Basophils Relative: 0 %
Eosinophils Absolute: 0 10*3/uL (ref 0.0–0.5)
Eosinophils Relative: 0 %
HCT: 37.5 % — ABNORMAL LOW (ref 39.0–52.0)
Hemoglobin: 12.1 g/dL — ABNORMAL LOW (ref 13.0–17.0)
Lymphocytes Relative: 67 %
Lymphs Abs: 5.3 10*3/uL — ABNORMAL HIGH (ref 0.7–4.0)
MCH: 28.4 pg (ref 26.0–34.0)
MCHC: 32.3 g/dL (ref 30.0–36.0)
MCV: 88 fL (ref 80.0–100.0)
Monocytes Absolute: 0.5 10*3/uL (ref 0.1–1.0)
Monocytes Relative: 6 %
Neutro Abs: 2.1 10*3/uL (ref 1.7–7.7)
Neutrophils Relative %: 27 %
Platelets: 162 10*3/uL (ref 150–400)
RBC: 4.26 MIL/uL (ref 4.22–5.81)
RDW: 20.4 % — ABNORMAL HIGH (ref 11.5–15.5)
WBC: 7.9 10*3/uL (ref 4.0–10.5)
nRBC: 0.3 % — ABNORMAL HIGH (ref 0.0–0.2)
nRBC: 1 /100 WBC — ABNORMAL HIGH

## 2023-02-01 LAB — COMPREHENSIVE METABOLIC PANEL
ALT: 24 U/L (ref 0–44)
AST: 67 U/L — ABNORMAL HIGH (ref 15–41)
Albumin: 2.8 g/dL — ABNORMAL LOW (ref 3.5–5.0)
Alkaline Phosphatase: 138 U/L — ABNORMAL HIGH (ref 38–126)
Anion gap: 9 (ref 5–15)
BUN: <5 mg/dL — ABNORMAL LOW (ref 6–20)
CO2: 28 mmol/L (ref 22–32)
Calcium: 8.7 mg/dL — ABNORMAL LOW (ref 8.9–10.3)
Chloride: 102 mmol/L (ref 98–111)
Creatinine, Ser: 0.77 mg/dL (ref 0.61–1.24)
GFR, Estimated: >60 mL/min (ref >60)
Glucose, Bld: 101 mg/dL — ABNORMAL HIGH (ref 70–99)
Potassium: 3.7 mmol/L (ref 3.5–5.1)
Sodium: 139 mmol/L (ref 135–145)
Total Bilirubin: 0.5 mg/dL (ref 0.3–1.2)
Total Protein: 8.2 g/dL — ABNORMAL HIGH (ref 6.5–8.1)

## 2023-02-01 LAB — ETHANOL: Alcohol, Ethyl (B): 438 mg/dL (ref <10)

## 2023-02-01 MED ORDER — NAPROXEN 250 MG PO TABS
500.0000 mg | ORAL_TABLET | Freq: Once | ORAL | Status: AC
  Administered 2023-02-01: 500 mg via ORAL
  Filled 2023-02-01: qty 2

## 2023-02-01 NOTE — ED Notes (Signed)
Pt currently refusing labs. States he is going to call his sister and go home.
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
## 2023-02-01 NOTE — ED Provider Notes (Signed)
[facility] Provider Note   CSN: [id number] Arrival date & time: 02/01/23  1601     History  Chief Complaint  Patient presents with   Seizures   Alcohol Intoxication   Aggressive Behavior    [name] is a 42 y.o. male.  Patient is a 42 year old male with a history of hypertension, alcohol abuse, stroke, seizures on Keppra who is presenting today with EMS after they were called out when his roommate was concerned the patient had a seizure.  The patient reports he does not remember anything but does admit to drinking today.  He denies any pain anywhere.  He reports he would like to go home but does state he feels drunk right now.  Repetitively asking where he is.  The history is provided by the patient and the EMS personnel. The history is limited by the condition of the patient.  Seizures Alcohol Intoxication       Home Medications Prior to Admission medications   Medication Sig Start Date End Date Taking? Authorizing Provider  DULoxetine (CYMBALTA) 20 MG capsule Take 1 capsule (20 mg total) by mouth daily. 11/19/21   Dorna Mai, MD  folic acid (FOLVITE) 1 MG tablet Take 1 tablet (1 mg total) by mouth daily. TAKE 1 TABLET(1 MG) BY MOUTH DAILY Strength: 1 mg 11/14/22   Little Ishikawa, MD  ibuprofen (ADVIL) 600 MG tablet Take 1 tablet (600 mg total) by mouth every 8 (eight) hours as needed (pain). 09/07/22   Barrett Henle, MD  levETIRAcetam (KEPPRA) 750 MG tablet Take 1 tablet (750 mg total) by mouth 2 (two) times daily. 11/14/22   Little Ishikawa, MD  Multiple Vitamins-Minerals (MULTIVITAMIN WITH MINERALS) tablet Take 1 tablet by mouth daily. 11/14/22 11/14/23  Little Ishikawa, MD  phenol (CHLORASEPTIC WARM SORE THROAT) 1.4 % LIQD Use as directed 1 spray in the mouth or throat as needed for throat irritation / pain. 11/14/22   Little Ishikawa, MD  thiamine (VITAMIN B-1) 100 MG tablet Take 1 tablet (100 mg  total) by mouth daily. 11/14/22   Little Ishikawa, MD  tiZANidine (ZANAFLEX) 4 MG tablet Take 1 tablet (4 mg total) by mouth 3 (three) times daily. 11/21/22   Nyoka Lint, PA-C  Vitamin D, Ergocalciferol, (DRISDOL) 1.25 MG (50000 UNIT) CAPS capsule TAKE 1 CAPSULE BY MOUTH EVERY 7 DAYS Patient taking differently: Take 50,000 Units by mouth every 7 (seven) days. Wednesdays 02/18/22   Dorna Mai, MD      Allergies    Patient has no known allergies.    Review of Systems   Review of Systems  Neurological:  Positive for seizures.    Physical Exam Updated Vital Signs BP 94/81   Pulse 77   Resp 19   SpO2 98%  Physical Exam Vitals and nursing note reviewed.  Constitutional:      General: He is not in acute distress.    Appearance: He is well-developed.  HENT:     Head: Normocephalic and atraumatic.  Eyes:     Conjunctiva/sclera: Conjunctivae normal.     Pupils: Pupils are equal, round, and reactive to light.  Cardiovascular:     Rate and Rhythm: Normal rate and regular rhythm.     Heart sounds: No murmur heard. Pulmonary:     Effort: Pulmonary effort is normal. No respiratory distress.     Breath sounds: Normal breath sounds. No wheezing or rales.  Abdominal:  General: There is no distension.     Palpations: Abdomen is soft.     Tenderness: There is no abdominal tenderness. There is no guarding or rebound.  Musculoskeletal:        General: No tenderness. Normal range of motion.     Cervical back: Normal range of motion and neck supple.     Comments: Left BKA with callus that is tender on the end of his stump.  No signs of erythema or swelling.  Low suspicion for infection.  Skin:    General: Skin is warm and dry.     Findings: No erythema or rash.  Neurological:     Mental Status: He is alert.     Comments: Oriented to person.  Patient has slightly slurred speech and appears intoxicated but moving all extremities without difficulty.  Able to be redirected.   Psychiatric:        Behavior: Behavior normal.     ED Results / Procedures / Treatments   Labs (all labs ordered are listed, but only abnormal results are displayed) Labs Reviewed  CBC WITH DIFFERENTIAL/PLATELET - Abnormal; Notable for the following components:      Result Value   Hemoglobin 12.1 (*)    HCT 37.5 (*)    RDW 20.4 (*)    nRBC 0.3 (*)    Lymphs Abs 5.3 (*)    nRBC 1 (*)    All other components within normal limits  COMPREHENSIVE METABOLIC PANEL - Abnormal; Notable for the following components:   Glucose, Bld 101 (*)    BUN <5 (*)    Calcium 8.7 (*)    Total Protein 8.2 (*)    Albumin 2.8 (*)    AST 67 (*)    Alkaline Phosphatase 138 (*)    All other components within normal limits  ETHANOL - Abnormal; Notable for the following components:   Alcohol, Ethyl (B) 438 (*)    All other components within normal limits  RAPID URINE DRUG SCREEN, HOSP PERFORMED  PATHOLOGIST SMEAR REVIEW    EKG None  Radiology CT Head Wo Contrast  Result Date: 02/01/2023 CLINICAL DATA:  Mental status change, unknown cause EXAM: CT HEAD WITHOUT CONTRAST TECHNIQUE: Contiguous axial images were obtained from the base of the skull through the vertex without intravenous contrast. RADIATION DOSE REDUCTION: This exam was performed according to the departmental dose-optimization program which includes automated exposure control, adjustment of the mA and/or kV according to patient size and/or use of iterative reconstruction technique. COMPARISON:  CT head 08/18/2018 FINDINGS: Brain: Cerebral ventricle sizes are concordant with the degree of cerebral volume loss. No evidence of large-territorial acute infarction. No parenchymal hemorrhage. No mass lesion. No extra-axial collection. No mass effect or midline shift. No hydrocephalus. Basilar cisterns are patent. Vascular: No hyperdense vessel. Likely bilateral old healed lamina papyracea fractures. Skull: No acute fracture or focal lesion.  Sinuses/Orbits: Diffuse paranasal sinus mucosal thickening. Mastoid air cells are clear. The orbits are unremarkable. Other: None. IMPRESSION: 1. No acute intracranial abnormality. 2. Pansinusitis. Electronically Signed   By: Iven Finn M.D.   On: 02/01/2023 19:30    Procedures Procedures    Medications Ordered in ED Medications  naproxen (NAPROSYN) tablet 500 mg (has no administration in time range)    ED Course/ Medical Decision Making/ A&P                             Medical Decision Making Amount and/or  Complexity of Data Reviewed Labs: ordered. Decision-making details documented in ED Course. Radiology: ordered and independent interpretation performed. Decision-making details documented in ED Course.  Risk Prescription drug management.   Pt with multiple medical problems and comorbidities and presenting today with a complaint that caries a high risk for morbidity and mortality.  Here today after possible seizure at home but also admitting to drinking alcohol.  Patient is intoxicated here but is awake and able to carry on a conversation.  No obvious signs of trauma.  I independently interpreted patient's labs and CBC, CMP without acute findings except for minimal elevated AST of 67 but most likely related to regular alcohol use.  EtOH is 438 today.  I have independently visualized and interpreted pt's images today. CT head wnl without bleed.  Will allow pt to sober up and most likely can go home.  9:04 PM Pt is now up and walking around the department.  Spoke with his friend Iona Beard who will come and pick him up.  Pt appears to be safe for d/c with his friend.          Final Clinical Impression(s) / ED Diagnoses Final diagnoses:  Alcoholic intoxication without complication [facility])    Rx / DC Orders ED Discharge Orders     None         Blanchie Dessert, MD 02/01/23 2104

## 2023-02-01 NOTE — ED Triage Notes (Signed)
Pt arrives via EMS from home after roommate called 911 after the patient had a seizure. Roommate reported patient has consumed alcohol today. EMS states patient was combative with them.   Pt is AxOx4 at this time but he himself states he is drunk.

## 2023-02-01 NOTE — ED Notes (Signed)
Pt inquiring about a phone, no phone given to this nurse or location of phone. Pt is agitated at this time.

## 2023-02-01 NOTE — ED Notes (Signed)
Phone was located, per Dr. Theora Gianotti family to pick up pt. Pt should not leave alone. Awaiting arrival of pts sisters husband.

## 2023-02-01 NOTE — ED Notes (Signed)
Pt refused vitals upon departure

## 2023-02-03 LAB — PATHOLOGIST SMEAR REVIEW

## 2023-02-11 ENCOUNTER — Emergency Department (HOSPITAL_COMMUNITY)
Admission: EM | Admit: 2023-02-11 | Discharge: 2023-02-11 | Disposition: A | Discharge status: Home / Self Care | Payer: Medicaid Other | Attending: Emergency Medicine | Admitting: Emergency Medicine

## 2023-02-11 ENCOUNTER — Emergency Department (HOSPITAL_COMMUNITY): Payer: Medicaid Other

## 2023-02-11 ENCOUNTER — Other Ambulatory Visit: Payer: Self-pay

## 2023-02-11 DIAGNOSIS — I10 Essential (primary) hypertension: Secondary | ICD-10-CM | POA: Insufficient documentation

## 2023-02-11 DIAGNOSIS — Y908 Blood alcohol level of 240 mg/100 ml or more: Secondary | ICD-10-CM | POA: Diagnosis not present

## 2023-02-11 DIAGNOSIS — F10120 Alcohol abuse with intoxication, uncomplicated: Secondary | ICD-10-CM | POA: Insufficient documentation

## 2023-02-11 DIAGNOSIS — R7401 Elevation of levels of liver transaminase levels: Secondary | ICD-10-CM | POA: Diagnosis not present

## 2023-02-11 DIAGNOSIS — R1110 Vomiting, unspecified: Secondary | ICD-10-CM | POA: Diagnosis present

## 2023-02-11 DIAGNOSIS — F10920 Alcohol use, unspecified with intoxication, uncomplicated: Secondary | ICD-10-CM

## 2023-02-11 LAB — COMPREHENSIVE METABOLIC PANEL
ALT: 34 U/L (ref 0–44)
AST: 158 U/L — ABNORMAL HIGH (ref 15–41)
Albumin: 3 g/dL — ABNORMAL LOW (ref 3.5–5.0)
Alkaline Phosphatase: 166 U/L — ABNORMAL HIGH (ref 38–126)
Anion gap: 11 (ref 5–15)
BUN: <5 mg/dL — ABNORMAL LOW (ref 6–20)
CO2: 27 mmol/L (ref 22–32)
Calcium: 8.5 mg/dL — ABNORMAL LOW (ref 8.9–10.3)
Chloride: 102 mmol/L (ref 98–111)
Creatinine, Ser: 0.89 mg/dL (ref 0.61–1.24)
GFR, Estimated: >60 mL/min (ref >60)
Glucose, Bld: 93 mg/dL (ref 70–99)
Potassium: 4.8 mmol/L (ref 3.5–5.1)
Sodium: 140 mmol/L (ref 135–145)
Total Bilirubin: 0.9 mg/dL (ref 0.3–1.2)
Total Protein: 8.8 g/dL — ABNORMAL HIGH (ref 6.5–8.1)

## 2023-02-11 LAB — CBC WITH DIFFERENTIAL/PLATELET
Abs Immature Granulocytes: 0.02 10*3/uL (ref 0.00–0.07)
Basophils Absolute: 0.1 10*3/uL (ref 0.0–0.1)
Basophils Relative: 1 %
Eosinophils Absolute: 0.1 10*3/uL (ref 0.0–0.5)
Eosinophils Relative: 1 %
HCT: 35.8 % — ABNORMAL LOW (ref 39.0–52.0)
Hemoglobin: 12.2 g/dL — ABNORMAL LOW (ref 13.0–17.0)
Immature Granulocytes: 0 %
Lymphocytes Relative: 67 %
Lymphs Abs: 4.6 10*3/uL — ABNORMAL HIGH (ref 0.7–4.0)
MCH: 28.8 pg (ref 26.0–34.0)
MCHC: 34.1 g/dL (ref 30.0–36.0)
MCV: 84.4 fL (ref 80.0–100.0)
Monocytes Absolute: 0.4 10*3/uL (ref 0.1–1.0)
Monocytes Relative: 6 %
Neutro Abs: 1.8 10*3/uL (ref 1.7–7.7)
Neutrophils Relative %: 25 %
Platelets: 57 10*3/uL — ABNORMAL LOW (ref 150–400)
RBC: 4.24 MIL/uL (ref 4.22–5.81)
RDW: 20.7 % — ABNORMAL HIGH (ref 11.5–15.5)
WBC: 6.9 10*3/uL (ref 4.0–10.5)
nRBC: 0 % (ref 0.0–0.2)

## 2023-02-11 LAB — ETHANOL: Alcohol, Ethyl (B): 483 mg/dL (ref <10)

## 2023-02-11 MED ORDER — ONDANSETRON 4 MG PO TBDP
8.0000 mg | ORAL_TABLET | Freq: Once | ORAL | Status: AC
  Administered 2023-02-11: 8 mg via ORAL
  Filled 2023-02-11: qty 2

## 2023-02-11 NOTE — ED Notes (Signed)
Pt family member at bedside, pt will be leaving with this family member once discharged.

## 2023-02-11 NOTE — ED Triage Notes (Signed)
Pt bib [facility] family member stated he was vomiting black emesis. Pt states he thinks he has had a seizure, family member on scene states he didn't see any seizure activity but pt has hx. Fire arrived first and sated they did not see signs of pt being postictal. Ems vitals 154/82 120 p 18 rr 98 cbg 97 ra

## 2023-02-11 NOTE — ED Notes (Signed)
Pt found roaming the hall ways stating they he was about to leave. Pt redirected back to his room in the bed.

## 2023-02-11 NOTE — ED Provider Notes (Signed)
  Physical Exam  BP 125/84   Pulse 69   Temp 98.3 F (36.8 C) (Axillary)   Resp 19   SpO2 100%   Physical Exam Vitals and nursing note reviewed.  Constitutional:      Appearance: Normal appearance.  HENT:     Head: Normocephalic and atraumatic.     Nose: Nose normal.     Mouth/Throat:     Mouth: Mucous membranes are moist.  Eyes:     Pupils: Pupils are equal, round, and reactive to light.  Cardiovascular:     Rate and Rhythm: Normal rate.  Pulmonary:     Effort: Pulmonary effort is normal.  Abdominal:     General: Abdomen is flat.  Musculoskeletal:     Cervical back: Normal range of motion and neck supple.  Skin:    General: Skin is warm and dry.  Neurological:     Mental Status: He is alert and oriented to person, place, and time.     Procedures  Procedures  ED Course / MDM   Clinical Course as of 02/11/23 0807  Fri Feb 11, 2023  0631 Alcohol, Ethyl (B)(!!): 483 [JS]    Clinical Course User Index [JS] Janeece Fitting, PA-C   Medical Decision Making Amount and/or Complexity of Data Reviewed Labs: ordered. Decision-making details documented in ED Course. Radiology: ordered.  Risk Prescription drug management.   Patient care assumed from Beverely Risen. PA at shift change, please see her note for a full HPI. Briefly, patient here with medication non compliance, has a seizure, ETOH on board with a 483 level. Plan is for MTF.  8:05 AM Patient is requesting discharge with his legal guardian at the bedside, he is ambulatory with steady gait in the emergency department.  Vitals have remained stable.  Patient is stable for disposition.    Portions of this note were generated with [employer]. Dictation errors may occur despite best attempts at proofreading.         Janeece Fitting, PA-C 02/11/23 [id number]    Ripley Fraise, MD 02/14/23 [phone]

## 2023-02-11 NOTE — ED Provider Notes (Signed)
[facility] Provider Note   CSN: [id number] Arrival date & time: 02/11/23  [id number]     History  Chief Complaint  Patient presents with   Emesis    [name] is a 42 y.o. male.  42 year old male with past medical history of seizure disorder, hypertension, alcohol abuse presents after report of emesis per family per EMS.  Patient states that he thinks he had a seizure, did have loss of bladder control but reports his family cleaned him up and changed him before EMS brought him in today.  No report of postictal state, did not bite tongue.  No other complaints or concerns tonight.       Home Medications Prior to Admission medications   Medication Sig Start Date End Date Taking? Authorizing Provider  DULoxetine (CYMBALTA) 20 MG capsule Take 1 capsule (20 mg total) by mouth daily. 11/19/21   Dorna Mai, MD  folic acid (FOLVITE) 1 MG tablet Take 1 tablet (1 mg total) by mouth daily. TAKE 1 TABLET(1 MG) BY MOUTH DAILY Strength: 1 mg 11/14/22   Little Ishikawa, MD  ibuprofen (ADVIL) 600 MG tablet Take 1 tablet (600 mg total) by mouth every 8 (eight) hours as needed (pain). 09/07/22   Barrett Henle, MD  levETIRAcetam (KEPPRA) 750 MG tablet Take 1 tablet (750 mg total) by mouth 2 (two) times daily. 11/14/22   Little Ishikawa, MD  Multiple Vitamins-Minerals (MULTIVITAMIN WITH MINERALS) tablet Take 1 tablet by mouth daily. 11/14/22 11/14/23  Little Ishikawa, MD  phenol (CHLORASEPTIC WARM SORE THROAT) 1.4 % LIQD Use as directed 1 spray in the mouth or throat as needed for throat irritation / pain. 11/14/22   Little Ishikawa, MD  thiamine (VITAMIN B-1) 100 MG tablet Take 1 tablet (100 mg total) by mouth daily. 11/14/22   Little Ishikawa, MD  tiZANidine (ZANAFLEX) 4 MG tablet Take 1 tablet (4 mg total) by mouth 3 (three) times daily. 11/21/22   Nyoka Lint, PA-C  Vitamin D, Ergocalciferol, (DRISDOL) 1.25 MG (50000 UNIT) CAPS  capsule TAKE 1 CAPSULE BY MOUTH EVERY 7 DAYS Patient taking differently: Take 50,000 Units by mouth every 7 (seven) days. Wednesdays 02/18/22   Dorna Mai, MD      Allergies    Patient has no known allergies.    Review of Systems   Review of Systems Negative except as per HPI Physical Exam Updated Vital Signs BP 107/84   Pulse 88   Temp 98.3 F (36.8 C) (Axillary)   Resp 19   SpO2 100%  Physical Exam Vitals and nursing note reviewed.  Constitutional:      General: He is not in acute distress.    Appearance: He is well-developed. He is not diaphoretic.  HENT:     Head: Normocephalic and atraumatic.     Mouth/Throat:     Mouth: Mucous membranes are moist.     Comments: No injury to tongue Cardiovascular:     Rate and Rhythm: Normal rate and regular rhythm.     Heart sounds: Normal heart sounds.  Pulmonary:     Effort: Pulmonary effort is normal.     Breath sounds: Normal breath sounds.  Abdominal:     Palpations: Abdomen is soft.     Tenderness: There is no abdominal tenderness.  Musculoskeletal:        General: No swelling or tenderness. Normal range of motion.     Comments: Left BKA  Skin:  General: Skin is warm and dry.     Findings: No erythema or rash.  Neurological:     Mental Status: He is alert and oriented to person, place, and time.  Psychiatric:        Behavior: Behavior normal.     ED Results / Procedures / Treatments   Labs (all labs ordered are listed, but only abnormal results are displayed) Labs Reviewed  CBC WITH DIFFERENTIAL/PLATELET - Abnormal; Notable for the following components:      Result Value   Hemoglobin 12.2 (*)    HCT 35.8 (*)    RDW 20.7 (*)    Platelets 57 (*)    Lymphs Abs 4.6 (*)    All other components within normal limits  COMPREHENSIVE METABOLIC PANEL - Abnormal; Notable for the following components:   BUN <5 (*)    Calcium 8.5 (*)    Total Protein 8.8 (*)    Albumin 3.0 (*)    AST 158 (*)    Alkaline  Phosphatase 166 (*)    All other components within normal limits  ETHANOL - Abnormal; Notable for the following components:   Alcohol, Ethyl (B) 483 (*)    All other components within normal limits    EKG None  Radiology CT Cervical Spine Wo Contrast  Result Date: 02/11/2023 CLINICAL DATA:  Seizure. EXAM: CT HEAD WITHOUT CONTRAST CT CERVICAL SPINE WITHOUT CONTRAST TECHNIQUE: Multidetector CT imaging of the head and cervical spine was performed following the standard protocol without intravenous contrast. Multiplanar CT image reconstructions of the cervical spine were also generated. RADIATION DOSE REDUCTION: This exam was performed according to the departmental dose-optimization program which includes automated exposure control, adjustment of the mA and/or kV according to patient size and/or use of iterative reconstruction technique. COMPARISON:  Head CT 02/01/2023.  Cervical spine CT 05/23/2020. FINDINGS: CT HEAD FINDINGS Brain: No evidence of acute infarction, hemorrhage, hydrocephalus, extra-axial collection or mass lesion/mass effect. Diffuse loss of parenchymal volume is consistent with atrophy. Patchy low attenuation in the deep hemispheric and periventricular white matter is nonspecific, but likely reflects chronic microvascular ischemic demyelination. Vascular: No hyperdense vessel or unexpected calcification. Skull: No evidence for fracture. No worrisome lytic or sclerotic lesion. Sinuses/Orbits: Marked mucosal thickening again noted maxillary sinuses and ethmoid air cells with associated frontal sinus disease. Deformity in the medial wall of each orbit is stable in the interval. Other: None. CT CERVICAL SPINE FINDINGS Alignment: Normal. Skull base and vertebrae: No acute fracture. No primary bone lesion or focal pathologic process. Soft tissues and spinal canal: No prevertebral fluid or swelling. No visible canal hematoma. Disc levels: Mild loss of disc height C5-6. Facets are well aligned  bilaterally. Upper chest: Unremarkable. Other: None IMPRESSION: 1. No acute intracranial abnormality. 2. Atrophy with chronic small vessel ischemic disease. 3. Chronic paranasal sinus disease. 4. No cervical spine fracture or subluxation. Electronically Signed   By: Misty Stanley M.D.   On: 02/11/2023 05:52   CT Head Wo Contrast  Result Date: 02/11/2023 CLINICAL DATA:  Seizure. EXAM: CT HEAD WITHOUT CONTRAST CT CERVICAL SPINE WITHOUT CONTRAST TECHNIQUE: Multidetector CT imaging of the head and cervical spine was performed following the standard protocol without intravenous contrast. Multiplanar CT image reconstructions of the cervical spine were also generated. RADIATION DOSE REDUCTION: This exam was performed according to the departmental dose-optimization program which includes automated exposure control, adjustment of the mA and/or kV according to patient size and/or use of iterative reconstruction technique. COMPARISON:  Head CT 02/01/2023.  Cervical spine CT 05/23/2020. FINDINGS: CT HEAD FINDINGS Brain: No evidence of acute infarction, hemorrhage, hydrocephalus, extra-axial collection or mass lesion/mass effect. Diffuse loss of parenchymal volume is consistent with atrophy. Patchy low attenuation in the deep hemispheric and periventricular white matter is nonspecific, but likely reflects chronic microvascular ischemic demyelination. Vascular: No hyperdense vessel or unexpected calcification. Skull: No evidence for fracture. No worrisome lytic or sclerotic lesion. Sinuses/Orbits: Marked mucosal thickening again noted maxillary sinuses and ethmoid air cells with associated frontal sinus disease. Deformity in the medial wall of each orbit is stable in the interval. Other: None. CT CERVICAL SPINE FINDINGS Alignment: Normal. Skull base and vertebrae: No acute fracture. No primary bone lesion or focal pathologic process. Soft tissues and spinal canal: No prevertebral fluid or swelling. No visible canal hematoma.  Disc levels: Mild loss of disc height C5-6. Facets are well aligned bilaterally. Upper chest: Unremarkable. Other: None IMPRESSION: 1. No acute intracranial abnormality. 2. Atrophy with chronic small vessel ischemic disease. 3. Chronic paranasal sinus disease. 4. No cervical spine fracture or subluxation. Electronically Signed   By: Misty Stanley M.D.   On: 02/11/2023 05:52    Procedures Procedures    Medications Ordered in ED Medications  ondansetron (ZOFRAN-ODT) disintegrating tablet 8 mg (8 mg Oral Given 02/11/23 [id number])    ED Course/ Medical Decision Making/ A&P Clinical Course as of 02/11/23 0636  Fri Feb 11, 2023  0631 Alcohol, Ethyl (B)(!!): 483 [JS]    Clinical Course User Index [JS] Janeece Fitting, PA-C                             Medical Decision Making Amount and/or Complexity of Data Reviewed Labs: ordered. Radiology: ordered.   42 year old male brought in by EMS from home.  EMS reports family was concerned for black emesis.  Patient denies this and states he thinks he had a seizure. History of alcohol abuse, question GI bleed vs seizure vs alcohol intoxication. CT head and c-spine ordered for vauge history of possible seizure in the setting of possible intoxicated state and unable to clear c-spine with NEXUS criteria. Labs ordered to evaluate for electrolyte disturbance.  Patient requests sandwich and a blanket.   CT head and C-spine negative for acute findings. CBC without significant change to H&H compared to prior on file.  Doubt significant GI bleed.  Alcohol level significantly elevated at 493.  CMP with mildly elevated AST at 158, alk phos 166, likely secondary to his alcohol use.  Plan is to metabolize and discharge, discharging to the care of of a friend preferably.  Care signed out to oncoming provider at change of shift.        Final Clinical Impression(s) / ED Diagnoses Final diagnoses:  Alcoholic intoxication without complication [facility])    Rx / DC  Orders ED Discharge Orders     None         Tacy Learn, PA-C 02/11/23 [id number]    Ripley Fraise, MD 02/11/23 [phone]

## 2023-02-11 NOTE — Discharge Instructions (Addendum)
Please STOP drinking as this can worsen your seizure disorder.    Follow up with your primary care physician as needed.

## 2023-02-11 NOTE — ED Notes (Signed)
Pt DC with legal guardian. AVS reviwed with pt guardian all questions answered. Pt stable and DC via wheelchair to pts guardian car.

## 2023-04-16 ENCOUNTER — Emergency Department (HOSPITAL_COMMUNITY): Payer: Medicaid Other

## 2023-04-16 ENCOUNTER — Other Ambulatory Visit: Payer: Self-pay

## 2023-04-16 ENCOUNTER — Emergency Department (HOSPITAL_COMMUNITY)
Admission: EM | Admit: 2023-04-16 | Discharge: 2023-04-17 | Disposition: A | Discharge status: Home / Self Care | Payer: Medicaid Other | Attending: Emergency Medicine | Admitting: Emergency Medicine

## 2023-04-16 DIAGNOSIS — R748 Abnormal levels of other serum enzymes: Secondary | ICD-10-CM | POA: Diagnosis not present

## 2023-04-16 DIAGNOSIS — Y908 Blood alcohol level of 240 mg/100 ml or more: Secondary | ICD-10-CM | POA: Insufficient documentation

## 2023-04-16 DIAGNOSIS — F10920 Alcohol use, unspecified with intoxication, uncomplicated: Secondary | ICD-10-CM | POA: Diagnosis not present

## 2023-04-16 DIAGNOSIS — R569 Unspecified convulsions: Secondary | ICD-10-CM | POA: Diagnosis present

## 2023-04-16 DIAGNOSIS — W19XXXA Unspecified fall, initial encounter: Secondary | ICD-10-CM | POA: Insufficient documentation

## 2023-04-16 LAB — CBC WITH DIFFERENTIAL/PLATELET
Abs Immature Granulocytes: 0.01 10*3/uL (ref 0.00–0.07)
Basophils Absolute: 0.1 10*3/uL (ref 0.0–0.1)
Basophils Relative: 2 %
Eosinophils Absolute: 0.1 10*3/uL (ref 0.0–0.5)
Eosinophils Relative: 2 %
HCT: 29.5 % — ABNORMAL LOW (ref 39.0–52.0)
Hemoglobin: 9.7 g/dL — ABNORMAL LOW (ref 13.0–17.0)
Immature Granulocytes: 0 %
Lymphocytes Relative: 69 %
Lymphs Abs: 4.5 10*3/uL — ABNORMAL HIGH (ref 0.7–4.0)
MCH: 29.9 pg (ref 26.0–34.0)
MCHC: 32.9 g/dL (ref 30.0–36.0)
MCV: 91 fL (ref 80.0–100.0)
Monocytes Absolute: 0.4 10*3/uL (ref 0.1–1.0)
Monocytes Relative: 6 %
Neutro Abs: 1.4 10*3/uL — ABNORMAL LOW (ref 1.7–7.7)
Neutrophils Relative %: 21 %
Platelets: 47 10*3/uL — ABNORMAL LOW (ref 150–400)
RBC: 3.24 MIL/uL — ABNORMAL LOW (ref 4.22–5.81)
RDW: 18.8 % — ABNORMAL HIGH (ref 11.5–15.5)
WBC: 6.5 10*3/uL (ref 4.0–10.5)
nRBC: 0 % (ref 0.0–0.2)

## 2023-04-16 LAB — COMPREHENSIVE METABOLIC PANEL
ALT: 52 U/L — ABNORMAL HIGH (ref 0–44)
AST: 362 U/L — ABNORMAL HIGH (ref 15–41)
Albumin: 2.9 g/dL — ABNORMAL LOW (ref 3.5–5.0)
Alkaline Phosphatase: 176 U/L — ABNORMAL HIGH (ref 38–126)
Anion gap: 11 (ref 5–15)
BUN: <5 mg/dL — ABNORMAL LOW (ref 6–20)
CO2: 26 mmol/L (ref 22–32)
Calcium: 8.3 mg/dL — ABNORMAL LOW (ref 8.9–10.3)
Chloride: 101 mmol/L (ref 98–111)
Creatinine, Ser: 0.82 mg/dL (ref 0.61–1.24)
GFR, Estimated: >60 mL/min (ref >60)
Glucose, Bld: 100 mg/dL — ABNORMAL HIGH (ref 70–99)
Potassium: 3.7 mmol/L (ref 3.5–5.1)
Sodium: 138 mmol/L (ref 135–145)
Total Bilirubin: 1.3 mg/dL — ABNORMAL HIGH (ref 0.3–1.2)
Total Protein: 7.8 g/dL (ref 6.5–8.1)

## 2023-04-16 LAB — CBG MONITORING, ED: Glucose-Capillary: 89 mg/dL (ref 70–99)

## 2023-04-16 LAB — MAGNESIUM: Magnesium: 1.6 mg/dL — ABNORMAL LOW (ref 1.7–2.4)

## 2023-04-16 MED ORDER — LEVETIRACETAM IN NACL 1000 MG/100ML IV SOLN
1000.0000 mg | Freq: Two times a day (BID) | INTRAVENOUS | Status: DC
  Administered 2023-04-16: 1000 mg via INTRAVENOUS
  Filled 2023-04-16: qty 100

## 2023-04-16 NOTE — ED Notes (Signed)
Patient refusing to be put on cardiac monitor

## 2023-04-16 NOTE — ED Provider Notes (Signed)
[facility]-EMERGENCY DEPT [facility] Emergency Department Provider Note MRN:  [id number]  Arrival date & time: 04/17/23     Chief Complaint   Seizures   History of Present Illness   [name] is a 42 y.o. year-old male presents to the ED with chief complaint of seizure prior to arrival.  Patient had a witnessed seizure by a friend.  Patient seems very intoxicated.  Concerns for medication noncompliance.  He denies being in any pain.  Denies any other complaints..  History provided by patient.   Review of Systems  Pertinent positive and negative review of systems noted in HPI.    Physical Exam   Vitals:   04/17/23 0200 04/17/23 0538  BP: 115/78 103/80  Pulse: 80 73  Resp: 16 18  Temp:  98.7 F (37.1 C)  SpO2: 99% 100%    CONSTITUTIONAL: Intoxicated-appearing, NAD NEURO:  Alert and oriented x 3, CN 3-12 grossly intact EYES:  eyes equal and reactive ENT/NECK:  Supple, no stridor  CARDIO: Normal rate, regular rhythm, appears well-perfused  PULM:  No respiratory distress, clear to auscultation GI/GU:  non-distended,  MSK/SPINE:  No gross deformities, no edema, moves all extremities, left BKA SKIN:  no rash, atraumatic   *Additional and/or pertinent findings included in MDM below  Diagnostic and Interventional Summary    EKG Interpretation  Date/Time:    Ventricular Rate:    PR Interval:    QRS Duration:   QT Interval:    QTC Calculation:   R Axis:     Text Interpretation:         Labs Reviewed  CBC WITH DIFFERENTIAL/PLATELET - Abnormal; Notable for the following components:      Result Value   RBC 3.24 (*)    Hemoglobin 9.7 (*)    HCT 29.5 (*)    RDW 18.8 (*)    Platelets 47 (*)    Neutro Abs 1.4 (*)    Lymphs Abs 4.5 (*)    All other components within normal limits  COMPREHENSIVE METABOLIC PANEL - Abnormal; Notable for the following components:   Glucose, Bld 100 (*)    BUN <5 (*)    Calcium 8.3 (*)    Albumin 2.9 (*)    AST 362 (*)     ALT 52 (*)    Alkaline Phosphatase 176 (*)    Total Bilirubin 1.3 (*)    All other components within normal limits  MAGNESIUM - Abnormal; Notable for the following components:   Magnesium 1.6 (*)    All other components within normal limits  ETHANOL - Abnormal; Notable for the following components:   Alcohol, Ethyl (B) 479 (*)    All other components within normal limits  CBG MONITORING, ED    CT HEAD WO CONTRAST ( )  Final Result    CT Lumbar Spine Wo Contrast  Final Result    DG Knee Complete 4 Views Left  Final Result      Medications  levETIRAcetam (KEPPRA) IVPB 1000 mg/100 mL premix (0 mg Intravenous Stopped 04/16/23 2321)     Procedures  /  Critical Care Procedures  ED Course and Medical Decision Making  I have reviewed the triage vital signs, the nursing notes, and pertinent available records from the EMR.  Social Determinants Affecting Complexity of Care: Patient is affected by alcoholism.   ED Course: Clinical Course as of 04/17/23 0631  Sat Apr 16, 2023  2231 I was notified that patient slipped and fell in the bathroom.  Will  check imaging.  I've asked nursing to room and undress the patient for a full exam. [RB]    Clinical Course User Index [RB] Roxy Horseman, PA-C    Medical Decision Making Patient here after 2 seizures.  He is acutely intoxicated.  I doubt alcohol withdrawal seizures.  I suspect that his seizures are secondary to medication noncompliance.  Will give loading dose of Keppra.  Will check labs and imaging.  CT head negative.  CT lumbar spine without findings.  Incidental cholelithiasis without cholecystitis.  Patient does not have any right upper quadrant pain.  He does have some elevated LFTs, which are a bit higher than his baseline.  I suspect is secondary to his alcohol use.  Patient sober up in the ED overnight.  Now alert and oriented.  Plan for discharge home..  Amount and/or Complexity of Data Reviewed Labs:  ordered. Radiology: ordered.  Risk Prescription drug management.     Consultants: No consultations were needed in caring for this patient.   Treatment and Plan: Emergency department workup does not suggest an emergent condition requiring admission or immediate intervention beyond  what has been performed at this time. The patient is safe for discharge and has  been instructed to return immediately for worsening symptoms, change in  symptoms or any other concerns    Final Clinical Impressions(s) / ED Diagnoses     ICD-10-CM   1. Seizure (HCC)  R56.9     2. Alcoholic intoxication without complication (HCC)  F10.920     3. Elevated liver enzymes  R74.8     4. Fall, initial encounter  [email or web address]       ED Discharge Orders     None         Discharge Instructions Discussed with and Provided to Patient:     Discharge Instructions      I recommend that you decrease your alcohol intake.  Your liver enzymes were elevated, this is likely due to your drinking.  I recommend that you follow-up with your primary care doctor.  Please continue taking your medications as prescribed.  Return for new or worsening symptoms.       Roxy Horseman, PA-C 04/17/23 0631    Sloan Leiter, DO 04/17/23 [phone]

## 2023-04-16 NOTE — ED Triage Notes (Signed)
Pt arrived via [facility] for seizure from home. Pt reports two seizures back to back while in the back seat of vehicle, history of seizures. Pt prescribed Keppra, reports compliance. ETOH+, pt reports 6 beers. Alert, oriented, and ambulatory on scene, thought unsteady gait while on scene. No loss of bladder control, no oral trauma or other.   BP 118/70 SPO2 95% HR 66 RR 18 CBG 135

## 2023-04-16 NOTE — ED Notes (Signed)
This RN noted to hear patient fall in bathroom. Patient denies hitting head, patient stated he slipped on something wet on the bathroom floor and landed on his butt. Patient denies head pain, states his butt is sore.

## 2023-04-17 ENCOUNTER — Emergency Department (HOSPITAL_COMMUNITY): Payer: Medicaid Other

## 2023-04-17 ENCOUNTER — Encounter (HOSPITAL_COMMUNITY): Payer: Self-pay

## 2023-04-17 ENCOUNTER — Ambulatory Visit (HOSPITAL_COMMUNITY)
Admission: EM | Admit: 2023-04-17 | Discharge: 2023-04-17 | Disposition: A | Discharge status: Home / Self Care | Payer: Medicaid Other | Attending: Family Medicine | Admitting: Family Medicine

## 2023-04-17 DIAGNOSIS — L729 Follicular cyst of the skin and subcutaneous tissue, unspecified: Secondary | ICD-10-CM

## 2023-04-17 DIAGNOSIS — M545 Low back pain, unspecified: Secondary | ICD-10-CM | POA: Diagnosis not present

## 2023-04-17 DIAGNOSIS — R569 Unspecified convulsions: Secondary | ICD-10-CM

## 2023-04-17 LAB — ETHANOL: Alcohol, Ethyl (B): 479 mg/dL (ref <10)

## 2023-04-17 MED ORDER — LEVETIRACETAM 750 MG PO TABS: 750.0000 mg | ORAL_TABLET | Freq: Two times a day (BID) | ORAL | 1 refills | Status: DC

## 2023-04-17 MED ORDER — IBUPROFEN 600 MG PO TABS: 600.0000 mg | ORAL_TABLET | Freq: Three times a day (TID) | ORAL | 0 refills | Status: DC | PRN

## 2023-04-17 NOTE — Discharge Instructions (Signed)
I recommend that you decrease your alcohol intake.  Your liver enzymes were elevated, this is likely due to your drinking.  I recommend that you follow-up with your primary care doctor.  Please continue taking your medications as prescribed.  Return for new or worsening symptoms.

## 2023-04-17 NOTE — Discharge Instructions (Signed)
Take ibuprofen 600 mg--1 tab every 8 hours as needed for pain.  Take your Keppra 750 mg--1 tablet by mouth 2 times daily  Please take a multivitamin over-the-counter 1 daily  Please make an appointment with your primary care.  They can address your health issues, possibly do blood work, and make appropriate referrals for you

## 2023-04-17 NOTE — ED Notes (Signed)
Attempted to call patient's sister per patient request.  Call goes straight to voicemail x 3.  Verified with registration that patient does not currently have a legal guardian on file.
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
## 2023-04-17 NOTE — ED Notes (Signed)
Patient resting in fetal position with eyes closed.  Respirations even and unlabored

## 2023-04-17 NOTE — ED Notes (Signed)
Multiple attempts made to contact patient's sister.  Phone continues to go to voicemail.  Patient able to ambulate with minimal assistance.  Per registration, patient does not have a legal guardian

## 2023-04-17 NOTE — ED Notes (Signed)
Pt continues to refuse cardiac monitoring at this time and removing of blood pressure cuff. Importance of continuous monitoring, but remains non compliant.

## 2023-04-17 NOTE — ED Triage Notes (Signed)
Here for med refill on all his medication. Pt also would like for the office to drain his eyeball abscess.

## 2023-04-25 ENCOUNTER — Emergency Department (HOSPITAL_COMMUNITY)
Admission: EM | Admit: 2023-04-25 | Discharge: 2023-04-25 | Discharge status: Against Medical Advice | Payer: Medicaid Other | Attending: Emergency Medicine | Admitting: Emergency Medicine

## 2023-04-25 ENCOUNTER — Other Ambulatory Visit: Payer: Self-pay

## 2023-04-25 ENCOUNTER — Encounter (HOSPITAL_COMMUNITY): Payer: Self-pay

## 2023-04-25 DIAGNOSIS — Z5329 Procedure and treatment not carried out because of patient's decision for other reasons: Secondary | ICD-10-CM | POA: Diagnosis not present

## 2023-04-25 DIAGNOSIS — F10120 Alcohol abuse with intoxication, uncomplicated: Secondary | ICD-10-CM | POA: Diagnosis not present

## 2023-04-25 DIAGNOSIS — R7989 Other specified abnormal findings of blood chemistry: Secondary | ICD-10-CM | POA: Insufficient documentation

## 2023-04-25 DIAGNOSIS — R000 Tachycardia, unspecified: Secondary | ICD-10-CM | POA: Diagnosis not present

## 2023-04-25 DIAGNOSIS — Y908 Blood alcohol level of 240 mg/100 ml or more: Secondary | ICD-10-CM | POA: Insufficient documentation

## 2023-04-25 DIAGNOSIS — R899 Unspecified abnormal finding in specimens from other organs, systems and tissues: Secondary | ICD-10-CM

## 2023-04-25 LAB — CBC WITH DIFFERENTIAL/PLATELET
Abs Immature Granulocytes: 0.01 10*3/uL (ref 0.00–0.07)
Basophils Absolute: 0.1 10*3/uL (ref 0.0–0.1)
Basophils Relative: 1 %
Eosinophils Absolute: 0.1 10*3/uL (ref 0.0–0.5)
Eosinophils Relative: 1 %
HCT: 31.1 % — ABNORMAL LOW (ref 39.0–52.0)
Hemoglobin: 10.4 g/dL — ABNORMAL LOW (ref 13.0–17.0)
Immature Granulocytes: 0 %
Lymphocytes Relative: 42 %
Lymphs Abs: 3.1 10*3/uL (ref 0.7–4.0)
MCH: 30 pg (ref 26.0–34.0)
MCHC: 33.4 g/dL (ref 30.0–36.0)
MCV: 89.6 fL (ref 80.0–100.0)
Monocytes Absolute: 0.8 10*3/uL (ref 0.1–1.0)
Monocytes Relative: 10 %
Neutro Abs: 3.5 10*3/uL (ref 1.7–7.7)
Neutrophils Relative %: 46 %
Platelets: 32 10*3/uL — ABNORMAL LOW (ref 150–400)
RBC: 3.47 MIL/uL — ABNORMAL LOW (ref 4.22–5.81)
RDW: 18.9 % — ABNORMAL HIGH (ref 11.5–15.5)
WBC: 7.6 10*3/uL (ref 4.0–10.5)
nRBC: 0 % (ref 0.0–0.2)

## 2023-04-25 LAB — I-STAT CHEM 8, ED
BUN: <3 mg/dL — ABNORMAL LOW (ref 6–20)
Calcium, Ion: 1.06 mmol/L — ABNORMAL LOW (ref 1.15–1.40)
Chloride: 96 mmol/L — ABNORMAL LOW (ref 98–111)
Creatinine, Ser: 1.1 mg/dL (ref 0.61–1.24)
Glucose, Bld: 99 mg/dL (ref 70–99)
HCT: 35 % — ABNORMAL LOW (ref 39.0–52.0)
Hemoglobin: 11.9 g/dL — ABNORMAL LOW (ref 13.0–17.0)
Potassium: 3.5 mmol/L (ref 3.5–5.1)
Sodium: 134 mmol/L — ABNORMAL LOW (ref 135–145)
TCO2: 26 mmol/L (ref 22–32)

## 2023-04-25 LAB — COMPREHENSIVE METABOLIC PANEL
ALT: 33 U/L (ref 0–44)
AST: 189 U/L — ABNORMAL HIGH (ref 15–41)
Albumin: 3 g/dL — ABNORMAL LOW (ref 3.5–5.0)
Alkaline Phosphatase: 182 U/L — ABNORMAL HIGH (ref 38–126)
Anion gap: 14 (ref 5–15)
BUN: <5 mg/dL — ABNORMAL LOW (ref 6–20)
CO2: 24 mmol/L (ref 22–32)
Calcium: 8.5 mg/dL — ABNORMAL LOW (ref 8.9–10.3)
Chloride: 94 mmol/L — ABNORMAL LOW (ref 98–111)
Creatinine, Ser: 0.79 mg/dL (ref 0.61–1.24)
GFR, Estimated: >60 mL/min (ref >60)
Glucose, Bld: 102 mg/dL — ABNORMAL HIGH (ref 70–99)
Potassium: 3.5 mmol/L (ref 3.5–5.1)
Sodium: 132 mmol/L — ABNORMAL LOW (ref 135–145)
Total Bilirubin: 1.4 mg/dL — ABNORMAL HIGH (ref 0.3–1.2)
Total Protein: 8.1 g/dL (ref 6.5–8.1)

## 2023-04-25 LAB — ETHANOL: Alcohol, Ethyl (B): 314 mg/dL (ref <10)

## 2023-04-25 LAB — MAGNESIUM: Magnesium: 1.3 mg/dL — ABNORMAL LOW (ref 1.7–2.4)

## 2023-04-25 MED ORDER — SODIUM CHLORIDE 0.9 % IV BOLUS: 1000.0000 mL | Freq: Once | INTRAVENOUS | Status: DC

## 2023-04-25 NOTE — ED Provider Notes (Signed)
[place] [facility] AT [facility] Provider Note   CSN: [id number] Arrival date & time: 04/25/23  1718     History  Chief Complaint  Patient presents with   Abnormal Lab    [name] is a 42 y.o. male history of alcohol abuse, delirium tremens, here presenting with lab abnormalities.  Patient was recently seen for seizures and is on Keppra.  Patient was thought to have a combination of primary seizure and also alcohol withdrawal seizures.  Patient states that he continues to drink alcohol.  He states that he drinks beer every day including today.  He had labs drawn with his doctor several days ago and states that he received a call that his calcium level was very low.  At that time his alcohol level is over 400.  He states that he did not get the message until today so decided to come today.  He states that he wants to continue to drink alcohol.  He denies any seizure activity.  The history is provided by the patient.       Home Medications Prior to Admission medications   Medication Sig Start Date End Date Taking? Authorizing Provider  ibuprofen (ADVIL) 600 MG tablet Take 1 tablet (600 mg total) by mouth every 8 (eight) hours as needed (pain). 04/17/23   Zenia Resides, MD  levETIRAcetam (KEPPRA) 750 MG tablet Take 1 tablet (750 mg total) by mouth 2 (two) times daily. 04/17/23   Zenia Resides, MD  Multiple Vitamins-Minerals (MULTIVITAMIN WITH MINERALS) tablet Take 1 tablet by mouth daily. 11/14/22 11/14/23  Azucena Fallen, MD      Allergies    Patient has no known allergies.    Review of Systems   Review of Systems  Neurological:  Positive for dizziness.  All other systems reviewed and are negative.   Physical Exam Updated Vital Signs BP 103/85   Pulse (!) 103   Resp 19   Ht 5\' 7"  (1.702 m)   Wt 74.8 kg   SpO2 98%   BMI 25.84 kg/m  Physical Exam Vitals and nursing note reviewed.  Constitutional:      Appearance: Normal  appearance.     Comments: Intoxicated, some resting tremors  HENT:     Head: Normocephalic.     Mouth/Throat:     Mouth: Mucous membranes are moist.  Eyes:     Extraocular Movements: Extraocular movements intact.     Pupils: Pupils are equal, round, and reactive to light.  Cardiovascular:     Rate and Rhythm: Normal rate and regular rhythm.     Pulses: Normal pulses.     Heart sounds: Normal heart sounds.  Pulmonary:     Effort: Pulmonary effort is normal.     Breath sounds: Normal breath sounds.  Abdominal:     General: Abdomen is flat.     Palpations: Abdomen is soft.  Musculoskeletal:        General: Normal range of motion.     Cervical back: Normal range of motion and neck supple.  Skin:    General: Skin is warm.     Capillary Refill: Capillary refill takes less than 2 seconds.  Neurological:     General: No focal deficit present.     Mental Status: He is alert and oriented to person, place, and time.  Psychiatric:     Comments: Intoxicated     ED Results / Procedures / Treatments   Labs (all labs ordered are listed,  but only abnormal results are displayed) Labs Reviewed  CBC WITH DIFFERENTIAL/PLATELET  COMPREHENSIVE METABOLIC PANEL  MAGNESIUM  ETHANOL  I-STAT CHEM 8, ED    EKG EKG Interpretation  Date/Time:  Monday Apr 25 2023 17:52:21 EDT Ventricular Rate:  130 PR Interval:  142 QRS Duration: 90 QT Interval:  302 QTC Calculation: 444 R Axis:   9 Text Interpretation: Sinus tachycardia Inferior infarct , age undetermined Abnormal ECG When compared with ECG of 13-Nov-2022 13:19, PREVIOUS ECG IS PRESENT Confirmed by Richardean Canal [id number]) on 04/25/2023 6:34:53 PM  Radiology No results found.  Procedures Procedures    Medications Ordered in ED Medications  sodium chloride 0.9 % bolus 1,000 mL (has no administration in time range)    ED Course/ Medical Decision Making/ A&P                             Medical Decision Making [name] is a 42  y.o. male history of alcohol intoxication here presenting with lab abnormalities.  Per the patient, his calcium was low.  He cannot tell me if it is alert electrolytes were low.  Patient has frequent electrolyte derangements secondary to alcohol use and not eating much.  Plan to check CBC and CMP and magnesium and alcohol level.  Will hydrate patient and reassess.  8:23 PM I was notified that patient left after evaluation.  Patient's labs are still pending.  Patient signed AMA paperwork.  Per the nurse, patient was able to ambulate by himself it is clinically sober.  Amount and/or Complexity of Data Reviewed Labs: ordered. ECG/medicine tests: ordered.    Final Clinical Impression(s) / ED Diagnoses Final diagnoses:  None    Rx / DC Orders ED Discharge Orders     None         Charlynne Pander, MD 04/25/23 2024

## 2023-04-25 NOTE — ED Notes (Signed)
Pt left AMA... MD Silverio Lay notified.Marland KitchenMarland Kitchen

## 2023-04-25 NOTE — ED Notes (Signed)
Lab called for critical etoh of 314; Dr.Yao made aware;pt already left AMA
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
## 2023-04-26 ENCOUNTER — Encounter: Payer: Self-pay | Admitting: Neurology

## 2023-05-25 ENCOUNTER — Ambulatory Visit: Payer: Medicaid Other | Admitting: Neurology

## 2023-05-25 ENCOUNTER — Encounter: Payer: Self-pay | Admitting: Neurology

## 2023-05-25 VITALS — BP 100/77 | HR 72 | Resp 18 | Ht 68.0 in | Wt 173.0 lb

## 2023-05-25 DIAGNOSIS — R569 Unspecified convulsions: Secondary | ICD-10-CM

## 2023-05-25 MED ORDER — LEVETIRACETAM 750 MG PO TABS: 750.0000 mg | ORAL_TABLET | Freq: Two times a day (BID) | ORAL | 11 refills | Status: DC
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
## 2023-08-03 ENCOUNTER — Emergency Department (HOSPITAL_COMMUNITY): Payer: Medicaid Other

## 2023-08-03 ENCOUNTER — Encounter (HOSPITAL_COMMUNITY): Payer: Self-pay | Admitting: Emergency Medicine

## 2023-08-03 ENCOUNTER — Emergency Department (HOSPITAL_COMMUNITY)
Admission: EM | Admit: 2023-08-03 | Discharge: 2023-08-03 | Disposition: A | Discharge status: Home / Self Care | Payer: Medicaid Other | Attending: Emergency Medicine | Admitting: Emergency Medicine

## 2023-08-03 DIAGNOSIS — Y908 Blood alcohol level of 240 mg/100 ml or more: Secondary | ICD-10-CM | POA: Insufficient documentation

## 2023-08-03 DIAGNOSIS — S0083XA Contusion of other part of head, initial encounter: Secondary | ICD-10-CM | POA: Insufficient documentation

## 2023-08-03 DIAGNOSIS — S72112A Displaced fracture of greater trochanter of left femur, initial encounter for closed fracture: Secondary | ICD-10-CM | POA: Insufficient documentation

## 2023-08-03 DIAGNOSIS — F1010 Alcohol abuse, uncomplicated: Secondary | ICD-10-CM | POA: Insufficient documentation

## 2023-08-03 DIAGNOSIS — S79912A Unspecified injury of left hip, initial encounter: Secondary | ICD-10-CM | POA: Diagnosis present

## 2023-08-03 DIAGNOSIS — F10920 Alcohol use, unspecified with intoxication, uncomplicated: Secondary | ICD-10-CM

## 2023-08-03 DIAGNOSIS — R748 Abnormal levels of other serum enzymes: Secondary | ICD-10-CM

## 2023-08-03 DIAGNOSIS — S72002A Fracture of unspecified part of neck of left femur, initial encounter for closed fracture: Secondary | ICD-10-CM

## 2023-08-03 LAB — HEPATITIS PANEL, ACUTE
HCV Ab: NONREACTIVE
Hep A IgM: NONREACTIVE
Hep B C IgM: NONREACTIVE
Hepatitis B Surface Ag: NONREACTIVE

## 2023-08-03 LAB — CBC WITH DIFFERENTIAL/PLATELET
Abs Immature Granulocytes: 0.02 10*3/uL (ref 0.00–0.07)
Basophils Absolute: 0.1 10*3/uL (ref 0.0–0.1)
Basophils Relative: 2 %
Eosinophils Absolute: 0.1 10*3/uL (ref 0.0–0.5)
Eosinophils Relative: 1 %
HCT: 28.7 % — ABNORMAL LOW (ref 39.0–52.0)
Hemoglobin: 9.8 g/dL — ABNORMAL LOW (ref 13.0–17.0)
Immature Granulocytes: 0 %
Lymphocytes Relative: 47 %
Lymphs Abs: 3.6 10*3/uL (ref 0.7–4.0)
MCH: 29.8 pg (ref 26.0–34.0)
MCHC: 34.1 g/dL (ref 30.0–36.0)
MCV: 87.2 fL (ref 80.0–100.0)
Monocytes Absolute: 0.8 10*3/uL (ref 0.1–1.0)
Monocytes Relative: 10 %
Neutro Abs: 3.1 10*3/uL (ref 1.7–7.7)
Neutrophils Relative %: 40 %
Platelets: 35 10*3/uL — ABNORMAL LOW (ref 150–400)
RBC: 3.29 MIL/uL — ABNORMAL LOW (ref 4.22–5.81)
RDW: 20.7 % — ABNORMAL HIGH (ref 11.5–15.5)
WBC: 7.6 10*3/uL (ref 4.0–10.5)
nRBC: 0 % (ref 0.0–0.2)

## 2023-08-03 LAB — COMPREHENSIVE METABOLIC PANEL
ALT: 30 U/L (ref 0–44)
AST: 156 U/L — ABNORMAL HIGH (ref 15–41)
Albumin: 3 g/dL — ABNORMAL LOW (ref 3.5–5.0)
Alkaline Phosphatase: 114 U/L (ref 38–126)
Anion gap: 17 — ABNORMAL HIGH (ref 5–15)
BUN: <5 mg/dL — ABNORMAL LOW (ref 6–20)
CO2: 22 mmol/L (ref 22–32)
Calcium: 8.8 mg/dL — ABNORMAL LOW (ref 8.9–10.3)
Chloride: 95 mmol/L — ABNORMAL LOW (ref 98–111)
Creatinine, Ser: 1.24 mg/dL (ref 0.61–1.24)
GFR, Estimated: >60 mL/min (ref >60)
Glucose, Bld: 107 mg/dL — ABNORMAL HIGH (ref 70–99)
Potassium: 3.4 mmol/L — ABNORMAL LOW (ref 3.5–5.1)
Sodium: 134 mmol/L — ABNORMAL LOW (ref 135–145)
Total Bilirubin: 1.5 mg/dL — ABNORMAL HIGH (ref 0.3–1.2)
Total Protein: 8.1 g/dL (ref 6.5–8.1)

## 2023-08-03 LAB — ACETAMINOPHEN LEVEL: Acetaminophen (Tylenol), Serum: <10 ug/mL — ABNORMAL LOW (ref 10–30)

## 2023-08-03 LAB — URINALYSIS, ROUTINE W REFLEX MICROSCOPIC
Bacteria, UA: NONE SEEN
Bilirubin Urine: NEGATIVE
Glucose, UA: NEGATIVE mg/dL
Ketones, ur: NEGATIVE mg/dL
Leukocytes,Ua: NEGATIVE
Nitrite: NEGATIVE
Protein, ur: NEGATIVE mg/dL
Specific Gravity, Urine: 1.003 — ABNORMAL LOW (ref 1.005–1.030)
pH: 6 (ref 5.0–8.0)

## 2023-08-03 LAB — ETHANOL: Alcohol, Ethyl (B): 330 mg/dL (ref <10)

## 2023-08-03 LAB — PROTIME-INR
INR: 1.2 (ref 0.8–1.2)
Prothrombin Time: 15 seconds (ref 11.4–15.2)

## 2023-08-03 MED ORDER — HYDROCODONE-ACETAMINOPHEN 5-325 MG PO TABS: 1.0000 | ORAL_TABLET | Freq: Four times a day (QID) | ORAL | 0 refills | Status: DC | PRN

## 2023-08-03 MED ORDER — THIAMINE MONONITRATE 100 MG PO TABS
100.0000 mg | ORAL_TABLET | Freq: Every day | ORAL | Status: DC
  Administered 2023-08-03: 100 mg via ORAL
  Filled 2023-08-03: qty 1

## 2023-08-03 MED ORDER — LORAZEPAM 1 MG PO TABS: 0.0000 mg | ORAL_TABLET | Freq: Four times a day (QID) | ORAL | Status: DC

## 2023-08-03 MED ORDER — SODIUM CHLORIDE 0.9 % IV BOLUS
1000.0000 mL | Freq: Once | INTRAVENOUS | Status: AC
  Administered 2023-08-03: 1000 mL via INTRAVENOUS

## 2023-08-03 MED ORDER — LORAZEPAM 1 MG PO TABS: 1.0000 mg | ORAL_TABLET | Freq: Once | ORAL | Status: DC

## 2023-08-03 MED ORDER — MORPHINE SULFATE (PF) 4 MG/ML IV SOLN
4.0000 mg | Freq: Once | INTRAVENOUS | Status: AC
  Administered 2023-08-03: 4 mg via INTRAVENOUS
  Filled 2023-08-03: qty 1

## 2023-08-03 MED ORDER — LORAZEPAM 2 MG/ML IJ SOLN: 0.0000 mg | Freq: Two times a day (BID) | INTRAMUSCULAR | Status: DC

## 2023-08-03 MED ORDER — LORAZEPAM 1 MG PO TABS: 0.0000 mg | ORAL_TABLET | Freq: Two times a day (BID) | ORAL | Status: DC

## 2023-08-03 MED ORDER — THIAMINE HCL 100 MG/ML IJ SOLN: 100.0000 mg | Freq: Every day | INTRAMUSCULAR | Status: DC

## 2023-08-03 MED ORDER — LORAZEPAM 2 MG/ML IJ SOLN: 0.0000 mg | Freq: Four times a day (QID) | INTRAMUSCULAR | Status: DC
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
## 2023-08-03 NOTE — Discharge Instructions (Addendum)
Follow up with primary care provider for follow up on today's visits findings, call to schedule appointment.   Call Dr Jena Gauss office to schedule an appointment for follow up on left hip fracture.   Please use crutches. I have sent Norco to your pharmacy. I recommend alternating Ibuprofen and Norco for pain control. Stay off the injured leg and ice area.   Return to the ER if symptoms continue or worsen.

## 2023-08-03 NOTE — Progress Notes (Signed)
OT Cancellation Note  Patient Details Name: Kalab Fidel MRN: [id number] DOB: [birth year]   Cancelled Treatment:    Reason Eval/Treat Not Completed: OT screened, no needs identified, will sign off. PT saw and pt at his personal baseline. OT to screen and sign off.   Tyler Deis, OTR/L [facility] Acute Rehabilitation Office: [phone]    Myrla Halsted 08/03/2023, 3:49 PM
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
## 2023-08-03 NOTE — ED Provider Notes (Signed)
  Physical Exam  BP (!) 117/93 (BP Location: Left Arm)   Pulse 88   Temp 98.3 F (36.8 C) (Oral)   Resp 18   SpO2 100%   Physical Exam Neurological:     Comments: Awake, alert. In NAD.      Procedures  Procedures  ED Course / MDM   Clinical Course as of 08/03/23 1612  Wed Aug 03, 2023  1149 Hemoglobin(!): 9.8 [JB]  1332 Alcohol, Ethyl (B)(!!): 330 Ethanol level 330 which is likely causing anion gap.  [CA]  1426 Discussed with Charma Igo, PA-C with orthopedics who notes injury is non-operative. Recommends PT/OT prior to discharge. If patient unable to tolerate pain, admit to medicine.  [CA]  1429 Patient declined admission for pain management. Prefers to be d/c home with crutches. Awaiting PT/OT evaluation [CA]    Clinical Course User Index [CA] Mannie Stabile, PA-C [JB] Barrett, Horald Chestnut, PA-C   Medical Decision Making Amount and/or Complexity of Data Reviewed Labs:  Decision-making details documented in ED Course. Radiology: ordered.  Risk OTC drugs. Prescription drug management.   Shift hand off from [facility], pending PT/OT evaluation. Both have been done and patient is cleared for discharge home. Guardian is on-site. Patient understands follow up instructions. Questions answered.        Elpidio Anis, PA-C 08/03/23 1614    Alvira Monday, MD 08/04/23 1159

## 2023-08-03 NOTE — ED Notes (Signed)
Patient denies any anxiety at this time.
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
## 2023-08-03 NOTE — ED Notes (Signed)
ETOH-330, Barrett, PA notified.

## 2023-09-21 ENCOUNTER — Emergency Department (HOSPITAL_COMMUNITY): Payer: Medicaid Other

## 2023-09-21 ENCOUNTER — Emergency Department (HOSPITAL_COMMUNITY)
Admission: EM | Admit: 2023-09-21 | Discharge: 2023-09-21 | Disposition: A | Discharge status: Home / Self Care | Payer: Medicaid Other | Attending: Emergency Medicine | Admitting: Emergency Medicine

## 2023-09-21 ENCOUNTER — Other Ambulatory Visit: Payer: Self-pay

## 2023-09-21 DIAGNOSIS — R4182 Altered mental status, unspecified: Secondary | ICD-10-CM | POA: Diagnosis present

## 2023-09-21 DIAGNOSIS — R4781 Slurred speech: Secondary | ICD-10-CM | POA: Insufficient documentation

## 2023-09-21 DIAGNOSIS — F10920 Alcohol use, unspecified with intoxication, uncomplicated: Secondary | ICD-10-CM

## 2023-09-21 DIAGNOSIS — R0781 Pleurodynia: Secondary | ICD-10-CM | POA: Diagnosis not present

## 2023-09-21 LAB — COMPREHENSIVE METABOLIC PANEL
ALT: 22 U/L (ref 0–44)
AST: 89 U/L — ABNORMAL HIGH (ref 15–41)
Albumin: 2.6 g/dL — ABNORMAL LOW (ref 3.5–5.0)
Alkaline Phosphatase: 118 U/L (ref 38–126)
Anion gap: 11 (ref 5–15)
BUN: 6 mg/dL (ref 6–20)
CO2: 23 mmol/L (ref 22–32)
Calcium: 8.5 mg/dL — ABNORMAL LOW (ref 8.9–10.3)
Chloride: 100 mmol/L (ref 98–111)
Creatinine, Ser: 0.78 mg/dL (ref 0.61–1.24)
GFR, Estimated: >60 mL/min (ref >60)
Glucose, Bld: 113 mg/dL — ABNORMAL HIGH (ref 70–99)
Potassium: 3.4 mmol/L — ABNORMAL LOW (ref 3.5–5.1)
Sodium: 134 mmol/L — ABNORMAL LOW (ref 135–145)
Total Bilirubin: 1.1 mg/dL (ref 0.3–1.2)
Total Protein: 7.9 g/dL (ref 6.5–8.1)

## 2023-09-21 LAB — CBC WITH DIFFERENTIAL/PLATELET
Abs Immature Granulocytes: 0.02 10*3/uL (ref 0.00–0.07)
Basophils Absolute: 0.1 10*3/uL (ref 0.0–0.1)
Basophils Relative: 1 %
Eosinophils Absolute: 0.2 10*3/uL (ref 0.0–0.5)
Eosinophils Relative: 2 %
HCT: 32 % — ABNORMAL LOW (ref 39.0–52.0)
Hemoglobin: 10.4 g/dL — ABNORMAL LOW (ref 13.0–17.0)
Immature Granulocytes: 0 %
Lymphocytes Relative: 64 %
Lymphs Abs: 5.6 10*3/uL — ABNORMAL HIGH (ref 0.7–4.0)
MCH: 29.5 pg (ref 26.0–34.0)
MCHC: 32.5 g/dL (ref 30.0–36.0)
MCV: 90.9 fL (ref 80.0–100.0)
Monocytes Absolute: 0.7 10*3/uL (ref 0.1–1.0)
Monocytes Relative: 9 %
Neutro Abs: 2.1 10*3/uL (ref 1.7–7.7)
Neutrophils Relative %: 24 %
Platelets: 87 10*3/uL — ABNORMAL LOW (ref 150–400)
RBC: 3.52 MIL/uL — ABNORMAL LOW (ref 4.22–5.81)
RDW: 21.3 % — ABNORMAL HIGH (ref 11.5–15.5)
WBC: 8.7 10*3/uL (ref 4.0–10.5)
nRBC: 0 % (ref 0.0–0.2)

## 2023-09-21 LAB — ETHANOL: Alcohol, Ethyl (B): 509 mg/dL (ref <10)

## 2023-09-21 LAB — TROPONIN I (HIGH SENSITIVITY): Troponin I (High Sensitivity): 14 ng/L (ref <18)

## 2023-09-21 LAB — AMMONIA: Ammonia: 27 umol/L (ref 9–35)

## 2023-09-21 NOTE — ED Notes (Signed)
Pt woke up from sleeping & he was very angry that he is still in the hospital. EDP came & spoke with him & pt is putting his prosthetic leg on the go to restroom & was given food/drink & was informed as long as he can walk/eat he will be d/c. Pt on his cell phone speaking to family now.

## 2023-09-21 NOTE — ED Notes (Signed)
Pts guardian gave pt permission for pt to go to the lobby & wait for his ride to pick him up when this RN asked her.

## 2023-09-21 NOTE — ED Provider Notes (Signed)
  Physical Exam  BP 96/70   Pulse 84   Temp 98.1 F (36.7 C) (Oral)   Resp 16   Ht 5\' 8"  (1.727 m)   Wt 78.5 kg   SpO2 100%   BMI 26.31 kg/m   Physical Exam Vitals and nursing note reviewed.  Constitutional:      General: He is not in acute distress.    Appearance: He is well-developed.  HENT:     Head: Normocephalic and atraumatic.  Eyes:     Conjunctiva/sclera: Conjunctivae normal.  Cardiovascular:     Rate and Rhythm: Normal rate and regular rhythm.     Heart sounds: No murmur heard. Pulmonary:     Effort: Pulmonary effort is normal. No respiratory distress.  Musculoskeletal:        General: No swelling.     Cervical back: Neck supple.  Skin:    General: Skin is warm and dry.  Neurological:     Mental Status: He is alert.  Psychiatric:        Mood and Affect: Mood normal.     Procedures  Procedures  ED Course / MDM    Medical Decision Making Amount and/or Complexity of Data Reviewed Labs: ordered. Radiology: ordered.   Patient received an handoff.  Alcohol intoxication pending metabolization of underlying alcohol.  Patient observed in the emergency department for multiple hours and ultimately was successfully able to metabolize underlying alcohol.  Able to ambulate in the hallways without difficulty.  Tolerating p.o. without difficulty.  Patient then discharged       Glendora Score, MD 09/21/23 [phone]
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
## 2023-09-27 ENCOUNTER — Ambulatory Visit: Payer: Medicaid Other | Admitting: Orthopedic Surgery

## 2023-09-27 DIAGNOSIS — Z89512 Acquired absence of left leg below knee: Secondary | ICD-10-CM

## 2023-09-28 ENCOUNTER — Encounter: Payer: Self-pay | Admitting: Orthopedic Surgery
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
## 2023-10-04 ENCOUNTER — Telehealth: Payer: Self-pay | Admitting: Orthopedic Surgery

## 2023-10-04 NOTE — Telephone Encounter (Signed)
09/27/23 progress note faxed to [employer] [phone]

## 2023-12-17 ENCOUNTER — Other Ambulatory Visit: Payer: Self-pay

## 2023-12-17 ENCOUNTER — Encounter (HOSPITAL_COMMUNITY): Payer: Self-pay | Admitting: *Deleted

## 2023-12-17 ENCOUNTER — Emergency Department (HOSPITAL_COMMUNITY)
Admission: EM | Admit: 2023-12-17 | Discharge: 2023-12-17 | Discharge status: Against Medical Advice | Payer: Medicaid Other | Attending: Emergency Medicine | Admitting: Emergency Medicine

## 2023-12-17 DIAGNOSIS — R040 Epistaxis: Secondary | ICD-10-CM | POA: Diagnosis present

## 2023-12-17 DIAGNOSIS — Z5321 Procedure and treatment not carried out due to patient leaving prior to being seen by health care provider: Secondary | ICD-10-CM | POA: Diagnosis not present

## 2023-12-17 NOTE — ED Triage Notes (Signed)
 Patient presents to ed via [facility] from home c/o nosebleed that started at 3am, Not bleeding upon ems arrival or arrival to ED. Strong odor ETOH

## 2023-12-21 ENCOUNTER — Encounter: Payer: Self-pay | Admitting: Neurology

## 2023-12-21 ENCOUNTER — Ambulatory Visit: Payer: Medicaid Other | Admitting: Neurology

## 2023-12-21 VITALS — BP 124/87 | HR 114 | Ht 69.0 in | Wt 184.4 lb

## 2023-12-21 DIAGNOSIS — R569 Unspecified convulsions: Secondary | ICD-10-CM

## 2023-12-21 MED ORDER — LEVETIRACETAM 750 MG PO TABS: ORAL_TABLET | ORAL | 11 refills | Status: DC
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
## 2023-12-29 ENCOUNTER — Encounter: Payer: Self-pay | Admitting: Neurology

## 2024-07-15 ENCOUNTER — Encounter (HOSPITAL_COMMUNITY): Payer: Self-pay

## 2024-07-15 ENCOUNTER — Emergency Department (HOSPITAL_COMMUNITY)

## 2024-07-15 ENCOUNTER — Other Ambulatory Visit: Payer: Self-pay

## 2024-07-15 ENCOUNTER — Inpatient Hospital Stay (HOSPITAL_COMMUNITY)
Admission: EM | Admit: 2024-07-15 | Discharge: 2024-07-19 | DRG: 378 | Disposition: A | Discharge status: Home / Self Care | Attending: Internal Medicine | Admitting: Internal Medicine

## 2024-07-15 DIAGNOSIS — A5431 Gonococcal conjunctivitis: Secondary | ICD-10-CM | POA: Diagnosis present

## 2024-07-15 DIAGNOSIS — F1011 Alcohol abuse, in remission: Secondary | ICD-10-CM

## 2024-07-15 DIAGNOSIS — K760 Fatty (change of) liver, not elsewhere classified: Secondary | ICD-10-CM | POA: Diagnosis present

## 2024-07-15 DIAGNOSIS — K219 Gastro-esophageal reflux disease without esophagitis: Secondary | ICD-10-CM | POA: Diagnosis present

## 2024-07-15 DIAGNOSIS — D509 Iron deficiency anemia, unspecified: Secondary | ICD-10-CM | POA: Diagnosis present

## 2024-07-15 DIAGNOSIS — F1020 Alcohol dependence, uncomplicated: Secondary | ICD-10-CM | POA: Diagnosis present

## 2024-07-15 DIAGNOSIS — D849 Immunodeficiency, unspecified: Secondary | ICD-10-CM | POA: Diagnosis present

## 2024-07-15 DIAGNOSIS — F1410 Cocaine abuse, uncomplicated: Secondary | ICD-10-CM | POA: Diagnosis present

## 2024-07-15 DIAGNOSIS — K2980 Duodenitis without bleeding: Secondary | ICD-10-CM | POA: Diagnosis present

## 2024-07-15 DIAGNOSIS — D689 Coagulation defect, unspecified: Secondary | ICD-10-CM | POA: Diagnosis present

## 2024-07-15 DIAGNOSIS — F17210 Nicotine dependence, cigarettes, uncomplicated: Secondary | ICD-10-CM | POA: Diagnosis present

## 2024-07-15 DIAGNOSIS — Z2981 Encounter for HIV pre-exposure prophylaxis: Secondary | ICD-10-CM

## 2024-07-15 DIAGNOSIS — Z8673 Personal history of transient ischemic attack (TIA), and cerebral infarction without residual deficits: Secondary | ICD-10-CM

## 2024-07-15 DIAGNOSIS — K2990 Gastroduodenitis, unspecified, without bleeding: Secondary | ICD-10-CM | POA: Diagnosis present

## 2024-07-15 DIAGNOSIS — E872 Acidosis, unspecified: Secondary | ICD-10-CM | POA: Diagnosis present

## 2024-07-15 DIAGNOSIS — I110 Hypertensive heart disease with heart failure: Secondary | ICD-10-CM | POA: Diagnosis present

## 2024-07-15 DIAGNOSIS — D62 Acute posthemorrhagic anemia: Secondary | ICD-10-CM | POA: Diagnosis present

## 2024-07-15 DIAGNOSIS — I959 Hypotension, unspecified: Secondary | ICD-10-CM | POA: Diagnosis not present

## 2024-07-15 DIAGNOSIS — D649 Anemia, unspecified: Secondary | ICD-10-CM

## 2024-07-15 DIAGNOSIS — D696 Thrombocytopenia, unspecified: Secondary | ICD-10-CM | POA: Diagnosis present

## 2024-07-15 DIAGNOSIS — G894 Chronic pain syndrome: Secondary | ICD-10-CM | POA: Diagnosis present

## 2024-07-15 DIAGNOSIS — K7030 Alcoholic cirrhosis of liver without ascites: Secondary | ICD-10-CM | POA: Diagnosis present

## 2024-07-15 DIAGNOSIS — D6959 Other secondary thrombocytopenia: Secondary | ICD-10-CM | POA: Diagnosis present

## 2024-07-15 DIAGNOSIS — Z79899 Other long term (current) drug therapy: Secondary | ICD-10-CM

## 2024-07-15 DIAGNOSIS — B3781 Candidal esophagitis: Secondary | ICD-10-CM | POA: Diagnosis present

## 2024-07-15 DIAGNOSIS — E876 Hypokalemia: Secondary | ICD-10-CM | POA: Diagnosis present

## 2024-07-15 DIAGNOSIS — I7121 Aneurysm of the ascending aorta, without rupture: Secondary | ICD-10-CM | POA: Diagnosis present

## 2024-07-15 DIAGNOSIS — K920 Hematemesis: Principal | ICD-10-CM

## 2024-07-15 DIAGNOSIS — I5022 Chronic systolic (congestive) heart failure: Secondary | ICD-10-CM | POA: Diagnosis present

## 2024-07-15 DIAGNOSIS — M87852 Other osteonecrosis, left femur: Secondary | ICD-10-CM | POA: Diagnosis present

## 2024-07-15 DIAGNOSIS — Z8249 Family history of ischemic heart disease and other diseases of the circulatory system: Secondary | ICD-10-CM

## 2024-07-15 DIAGNOSIS — Z89512 Acquired absence of left leg below knee: Secondary | ICD-10-CM

## 2024-07-15 DIAGNOSIS — K2970 Gastritis, unspecified, without bleeding: Secondary | ICD-10-CM

## 2024-07-15 DIAGNOSIS — K922 Gastrointestinal hemorrhage, unspecified: Secondary | ICD-10-CM | POA: Diagnosis present

## 2024-07-15 DIAGNOSIS — K766 Portal hypertension: Secondary | ICD-10-CM | POA: Diagnosis present

## 2024-07-15 DIAGNOSIS — F1910 Other psychoactive substance abuse, uncomplicated: Secondary | ICD-10-CM

## 2024-07-15 DIAGNOSIS — K2960 Other gastritis without bleeding: Principal | ICD-10-CM | POA: Diagnosis present

## 2024-07-15 DIAGNOSIS — K2961 Other gastritis with bleeding: Principal | ICD-10-CM | POA: Diagnosis present

## 2024-07-15 DIAGNOSIS — G40909 Epilepsy, unspecified, not intractable, without status epilepticus: Secondary | ICD-10-CM | POA: Diagnosis present

## 2024-07-15 LAB — COMPREHENSIVE METABOLIC PANEL WITH GFR
ALT: 23 U/L (ref 0–44)
AST: 87 U/L — ABNORMAL HIGH (ref 15–41)
Albumin: 2.1 g/dL — ABNORMAL LOW (ref 3.5–5.0)
Alkaline Phosphatase: 59 U/L (ref 38–126)
Anion gap: 11 (ref 5–15)
BUN: 7 mg/dL (ref 6–20)
CO2: 17 mmol/L — ABNORMAL LOW (ref 22–32)
Calcium: 6.9 mg/dL — ABNORMAL LOW (ref 8.9–10.3)
Chloride: 109 mmol/L (ref 98–111)
Creatinine, Ser: 0.78 mg/dL (ref 0.61–1.24)
GFR, Estimated: >60 mL/min (ref >60)
Glucose, Bld: 68 mg/dL — ABNORMAL LOW (ref 70–99)
Potassium: 2.6 mmol/L — CL (ref 3.5–5.1)
Sodium: 137 mmol/L (ref 135–145)
Total Bilirubin: 2.4 mg/dL — ABNORMAL HIGH (ref 0.0–1.2)
Total Protein: 6.1 g/dL — ABNORMAL LOW (ref 6.5–8.1)

## 2024-07-15 LAB — TYPE AND SCREEN
ABO/RH(D): O POS
Antibody Screen: NEGATIVE

## 2024-07-15 LAB — PROTIME-INR
INR: 1.4 — ABNORMAL HIGH (ref 0.8–1.2)
Prothrombin Time: 17.6 s — ABNORMAL HIGH (ref 11.4–15.2)

## 2024-07-15 LAB — CBC
HCT: 22.4 % — ABNORMAL LOW (ref 39.0–52.0)
Hemoglobin: 7.5 g/dL — ABNORMAL LOW (ref 13.0–17.0)
MCH: 33 pg (ref 26.0–34.0)
MCHC: 33.5 g/dL (ref 30.0–36.0)
MCV: 98.7 fL (ref 80.0–100.0)
Platelets: 29 K/uL — CL (ref 150–400)
RBC: 2.27 MIL/uL — ABNORMAL LOW (ref 4.22–5.81)
RDW: 23.4 % — ABNORMAL HIGH (ref 11.5–15.5)
WBC: 7.5 K/uL (ref 4.0–10.5)
nRBC: 0.4 % — ABNORMAL HIGH (ref 0.0–0.2)

## 2024-07-15 LAB — URINALYSIS, ROUTINE W REFLEX MICROSCOPIC
Bacteria, UA: NONE SEEN
Glucose, UA: NEGATIVE mg/dL
Ketones, ur: 20 mg/dL — AB
Leukocytes,Ua: NEGATIVE
Nitrite: NEGATIVE
Protein, ur: 100 mg/dL — AB
Specific Gravity, Urine: 1.026 (ref 1.005–1.030)
pH: 5 (ref 5.0–8.0)

## 2024-07-15 LAB — LIPASE, BLOOD: Lipase: 52 U/L — ABNORMAL HIGH (ref 11–51)

## 2024-07-15 LAB — APTT: aPTT: 41 s — ABNORMAL HIGH (ref 24–36)

## 2024-07-15 LAB — CBG MONITORING, ED: Glucose-Capillary: 83 mg/dL (ref 70–99)

## 2024-07-15 MED ORDER — POTASSIUM CHLORIDE 10 MEQ/100ML IV SOLN
10.0000 meq | INTRAVENOUS | Status: AC
  Administered 2024-07-15 – 2024-07-16 (×6): 10 meq via INTRAVENOUS
  Filled 2024-07-15 (×6): qty 100

## 2024-07-15 MED ORDER — SODIUM CHLORIDE 0.9 % IV BOLUS
1000.0000 mL | Freq: Once | INTRAVENOUS | Status: AC
  Administered 2024-07-15: 1000 mL via INTRAVENOUS

## 2024-07-15 MED ORDER — ONDANSETRON HCL 4 MG/2ML IJ SOLN
4.0000 mg | Freq: Once | INTRAMUSCULAR | Status: AC
  Administered 2024-07-15: 4 mg via INTRAVENOUS
  Filled 2024-07-15: qty 2

## 2024-07-15 MED ORDER — CALCIUM GLUCONATE-NACL 2-0.675 GM/100ML-% IV SOLN
2.0000 g | Freq: Once | INTRAVENOUS | Status: AC
  Administered 2024-07-15: 2000 mg via INTRAVENOUS
  Filled 2024-07-15: qty 100

## 2024-07-15 MED ORDER — LIDOCAINE VISCOUS HCL 2 % MT SOLN
15.0000 mL | Freq: Once | OROMUCOSAL | Status: AC
  Administered 2024-07-15: 15 mL via ORAL
  Filled 2024-07-15: qty 15

## 2024-07-15 MED ORDER — IOHEXOL 350 MG/ML SOLN
75.0000 mL | Freq: Once | INTRAVENOUS | Status: AC | PRN
  Administered 2024-07-15: 75 mL via INTRAVENOUS

## 2024-07-15 MED ORDER — FOLIC ACID 1 MG PO TABS
1.0000 mg | ORAL_TABLET | Freq: Once | ORAL | Status: AC
  Administered 2024-07-15: 1 mg via ORAL
  Filled 2024-07-15: qty 1

## 2024-07-15 MED ORDER — SODIUM CHLORIDE 0.9% IV SOLUTION: Freq: Once | INTRAVENOUS | Status: DC

## 2024-07-15 MED ORDER — OCTREOTIDE LOAD VIA INFUSION
50.0000 ug | Freq: Once | INTRAVENOUS | Status: AC
  Administered 2024-07-15: 50 ug via INTRAVENOUS
  Filled 2024-07-15: qty 25

## 2024-07-15 MED ORDER — PANTOPRAZOLE SODIUM 40 MG IV SOLR
40.0000 mg | Freq: Once | INTRAVENOUS | Status: AC
  Administered 2024-07-15: 40 mg via INTRAVENOUS
  Filled 2024-07-15: qty 10

## 2024-07-15 MED ORDER — SODIUM CHLORIDE 0.9 % IV SOLN
50.0000 ug/h | INTRAVENOUS | Status: DC
  Administered 2024-07-15 – 2024-07-16 (×2): 50 ug/h via INTRAVENOUS
  Filled 2024-07-15 (×2): qty 1

## 2024-07-15 MED ORDER — POTASSIUM CHLORIDE 20 MEQ PO PACK
80.0000 meq | PACK | Freq: Every day | ORAL | Status: DC
  Administered 2024-07-15: 80 meq via ORAL
  Filled 2024-07-15: qty 4

## 2024-07-15 MED ORDER — ALUM & MAG HYDROXIDE-SIMETH 200-200-20 MG/5ML PO SUSP
30.0000 mL | Freq: Once | ORAL | Status: AC
  Administered 2024-07-15: 30 mL via ORAL
  Filled 2024-07-15: qty 30

## 2024-07-15 NOTE — ED Triage Notes (Signed)
 Pt from home with ems, vomiting started yesterday, today was coffee ground like.  C.o generalized abd pain Hx of alcohol  abuse   HR 130---100 after 500 fluid bolus 106/72  4mg  zofran  Iv given PTA
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
## 2024-07-15 NOTE — ED Notes (Signed)
 Patient's legal guardian Kairon Shock gave verbal consent for patient to receive platelets. This was witnessed by this nurse and Almarie, Charity fundraiser.

## 2024-07-15 NOTE — ED Notes (Signed)
 This nurse called patient's legal guardian to obtain consent for blood product transfusion. This nurse was sent to voice mail. This nurse left a voice mail.

## 2024-07-15 NOTE — ED Notes (Signed)
 Patient transported to CT

## 2024-07-16 ENCOUNTER — Inpatient Hospital Stay (HOSPITAL_COMMUNITY): Admitting: Anesthesiology

## 2024-07-16 ENCOUNTER — Encounter (HOSPITAL_COMMUNITY): Payer: Self-pay | Admitting: Internal Medicine

## 2024-07-16 ENCOUNTER — Encounter (HOSPITAL_COMMUNITY)
Admission: EM | Disposition: A | Discharge status: Home / Self Care | Payer: Self-pay | Source: Home / Self Care | Attending: Family Medicine

## 2024-07-16 ENCOUNTER — Other Ambulatory Visit: Payer: Self-pay

## 2024-07-16 DIAGNOSIS — K766 Portal hypertension: Secondary | ICD-10-CM | POA: Diagnosis present

## 2024-07-16 DIAGNOSIS — F1410 Cocaine abuse, uncomplicated: Secondary | ICD-10-CM | POA: Diagnosis present

## 2024-07-16 DIAGNOSIS — K31A11 Gastric intestinal metaplasia without dysplasia, involving the antrum: Secondary | ICD-10-CM | POA: Diagnosis not present

## 2024-07-16 DIAGNOSIS — K5909 Other constipation: Secondary | ICD-10-CM

## 2024-07-16 DIAGNOSIS — E876 Hypokalemia: Secondary | ICD-10-CM | POA: Diagnosis present

## 2024-07-16 DIAGNOSIS — K2950 Unspecified chronic gastritis without bleeding: Secondary | ICD-10-CM

## 2024-07-16 DIAGNOSIS — D62 Acute posthemorrhagic anemia: Secondary | ICD-10-CM | POA: Diagnosis present

## 2024-07-16 DIAGNOSIS — B3781 Candidal esophagitis: Secondary | ICD-10-CM

## 2024-07-16 DIAGNOSIS — G40909 Epilepsy, unspecified, not intractable, without status epilepticus: Secondary | ICD-10-CM | POA: Diagnosis present

## 2024-07-16 DIAGNOSIS — K922 Gastrointestinal hemorrhage, unspecified: Secondary | ICD-10-CM | POA: Diagnosis present

## 2024-07-16 DIAGNOSIS — E878 Other disorders of electrolyte and fluid balance, not elsewhere classified: Secondary | ICD-10-CM

## 2024-07-16 DIAGNOSIS — F1011 Alcohol abuse, in remission: Secondary | ICD-10-CM | POA: Diagnosis not present

## 2024-07-16 DIAGNOSIS — F1020 Alcohol dependence, uncomplicated: Secondary | ICD-10-CM | POA: Diagnosis present

## 2024-07-16 DIAGNOSIS — K2980 Duodenitis without bleeding: Secondary | ICD-10-CM | POA: Diagnosis not present

## 2024-07-16 DIAGNOSIS — I110 Hypertensive heart disease with heart failure: Secondary | ICD-10-CM | POA: Diagnosis present

## 2024-07-16 DIAGNOSIS — I509 Heart failure, unspecified: Secondary | ICD-10-CM

## 2024-07-16 DIAGNOSIS — K7030 Alcoholic cirrhosis of liver without ascites: Secondary | ICD-10-CM | POA: Diagnosis present

## 2024-07-16 DIAGNOSIS — K229 Disease of esophagus, unspecified: Secondary | ICD-10-CM | POA: Diagnosis not present

## 2024-07-16 DIAGNOSIS — I959 Hypotension, unspecified: Secondary | ICD-10-CM | POA: Diagnosis not present

## 2024-07-16 DIAGNOSIS — K219 Gastro-esophageal reflux disease without esophagitis: Secondary | ICD-10-CM | POA: Diagnosis present

## 2024-07-16 DIAGNOSIS — D649 Anemia, unspecified: Secondary | ICD-10-CM | POA: Diagnosis not present

## 2024-07-16 DIAGNOSIS — R7989 Other specified abnormal findings of blood chemistry: Secondary | ICD-10-CM | POA: Diagnosis not present

## 2024-07-16 DIAGNOSIS — K2970 Gastritis, unspecified, without bleeding: Secondary | ICD-10-CM

## 2024-07-16 DIAGNOSIS — K2960 Other gastritis without bleeding: Secondary | ICD-10-CM | POA: Diagnosis not present

## 2024-07-16 DIAGNOSIS — F1910 Other psychoactive substance abuse, uncomplicated: Secondary | ICD-10-CM

## 2024-07-16 DIAGNOSIS — E872 Acidosis, unspecified: Secondary | ICD-10-CM | POA: Diagnosis present

## 2024-07-16 DIAGNOSIS — A5431 Gonococcal conjunctivitis: Secondary | ICD-10-CM | POA: Diagnosis present

## 2024-07-16 DIAGNOSIS — M87852 Other osteonecrosis, left femur: Secondary | ICD-10-CM | POA: Diagnosis present

## 2024-07-16 DIAGNOSIS — F17210 Nicotine dependence, cigarettes, uncomplicated: Secondary | ICD-10-CM | POA: Diagnosis present

## 2024-07-16 DIAGNOSIS — D849 Immunodeficiency, unspecified: Secondary | ICD-10-CM | POA: Diagnosis present

## 2024-07-16 DIAGNOSIS — G894 Chronic pain syndrome: Secondary | ICD-10-CM | POA: Diagnosis present

## 2024-07-16 DIAGNOSIS — D6959 Other secondary thrombocytopenia: Secondary | ICD-10-CM | POA: Diagnosis present

## 2024-07-16 DIAGNOSIS — K2990 Gastroduodenitis, unspecified, without bleeding: Secondary | ICD-10-CM | POA: Diagnosis not present

## 2024-07-16 DIAGNOSIS — K2961 Other gastritis with bleeding: Secondary | ICD-10-CM | POA: Diagnosis present

## 2024-07-16 DIAGNOSIS — Z89512 Acquired absence of left leg below knee: Secondary | ICD-10-CM | POA: Diagnosis not present

## 2024-07-16 DIAGNOSIS — I5022 Chronic systolic (congestive) heart failure: Secondary | ICD-10-CM | POA: Diagnosis present

## 2024-07-16 DIAGNOSIS — I7121 Aneurysm of the ascending aorta, without rupture: Secondary | ICD-10-CM | POA: Diagnosis present

## 2024-07-16 DIAGNOSIS — D689 Coagulation defect, unspecified: Secondary | ICD-10-CM | POA: Diagnosis present

## 2024-07-16 DIAGNOSIS — Z2981 Encounter for HIV pre-exposure prophylaxis: Secondary | ICD-10-CM | POA: Diagnosis not present

## 2024-07-16 DIAGNOSIS — D696 Thrombocytopenia, unspecified: Secondary | ICD-10-CM | POA: Diagnosis not present

## 2024-07-16 DIAGNOSIS — K920 Hematemesis: Secondary | ICD-10-CM | POA: Diagnosis not present

## 2024-07-16 DIAGNOSIS — D509 Iron deficiency anemia, unspecified: Secondary | ICD-10-CM | POA: Diagnosis present

## 2024-07-16 DIAGNOSIS — Z515 Encounter for palliative care: Secondary | ICD-10-CM | POA: Diagnosis not present

## 2024-07-16 HISTORY — PX: ESOPHAGOGASTRODUODENOSCOPY: SHX5428

## 2024-07-16 LAB — PREPARE PLATELET PHERESIS: Unit division: 0

## 2024-07-16 LAB — COMPREHENSIVE METABOLIC PANEL WITH GFR
ALT: 24 U/L (ref 0–44)
AST: 86 U/L — ABNORMAL HIGH (ref 15–41)
Albumin: 2.4 g/dL — ABNORMAL LOW (ref 3.5–5.0)
Alkaline Phosphatase: 71 U/L (ref 38–126)
Anion gap: 13 (ref 5–15)
BUN: 8 mg/dL (ref 6–20)
CO2: 19 mmol/L — ABNORMAL LOW (ref 22–32)
Calcium: 8.8 mg/dL — ABNORMAL LOW (ref 8.9–10.3)
Chloride: 101 mmol/L (ref 98–111)
Creatinine, Ser: 0.97 mg/dL (ref 0.61–1.24)
GFR, Estimated: >60 mL/min (ref >60)
Glucose, Bld: 93 mg/dL (ref 70–99)
Potassium: 4.5 mmol/L (ref 3.5–5.1)
Sodium: 133 mmol/L — ABNORMAL LOW (ref 135–145)
Total Bilirubin: 2.9 mg/dL — ABNORMAL HIGH (ref 0.0–1.2)
Total Protein: 6.9 g/dL (ref 6.5–8.1)

## 2024-07-16 LAB — PROTIME-INR
INR: 1.3 — ABNORMAL HIGH (ref 0.8–1.2)
Prothrombin Time: 16.5 s — ABNORMAL HIGH (ref 11.4–15.2)

## 2024-07-16 LAB — BPAM PLATELET PHERESIS
Blood Product Expiration Date: 202508042359
ISSUE DATE / TIME: 202508032111
Unit Type and Rh: 5100

## 2024-07-16 LAB — CBC
HCT: 28.7 % — ABNORMAL LOW (ref 39.0–52.0)
Hemoglobin: 9.7 g/dL — ABNORMAL LOW (ref 13.0–17.0)
MCH: 34.2 pg — ABNORMAL HIGH (ref 26.0–34.0)
MCHC: 33.8 g/dL (ref 30.0–36.0)
MCV: 101.1 fL — ABNORMAL HIGH (ref 80.0–100.0)
Platelets: 59 K/uL — ABNORMAL LOW (ref 150–400)
RBC: 2.84 MIL/uL — ABNORMAL LOW (ref 4.22–5.81)
RDW: 23.7 % — ABNORMAL HIGH (ref 11.5–15.5)
WBC: 8.3 K/uL (ref 4.0–10.5)
nRBC: 0.4 % — ABNORMAL HIGH (ref 0.0–0.2)

## 2024-07-16 LAB — RAPID URINE DRUG SCREEN, HOSP PERFORMED
Amphetamines: NOT DETECTED
Barbiturates: NOT DETECTED
Benzodiazepines: NOT DETECTED
Cocaine: POSITIVE — AB
Opiates: NOT DETECTED
Tetrahydrocannabinol: NOT DETECTED

## 2024-07-16 LAB — MAGNESIUM
Magnesium: 1.2 mg/dL — ABNORMAL LOW (ref 1.7–2.4)
Magnesium: 2 mg/dL (ref 1.7–2.4)

## 2024-07-16 LAB — ETHANOL: Alcohol, Ethyl (B): <15 mg/dL (ref <15)

## 2024-07-16 LAB — HIV ANTIBODY (ROUTINE TESTING W REFLEX): HIV Screen 4th Generation wRfx: NONREACTIVE

## 2024-07-16 SURGERY — EGD (ESOPHAGOGASTRODUODENOSCOPY)
Anesthesia: Monitor Anesthesia Care

## 2024-07-16 MED ORDER — LORAZEPAM 1 MG PO TABS: 1.0000 mg | ORAL_TABLET | ORAL | Status: DC | PRN

## 2024-07-16 MED ORDER — SODIUM CHLORIDE 0.9 % IV SOLN
INTRAVENOUS | Status: DC | PRN
Start: 2024-07-16 — End: 2024-07-16

## 2024-07-16 MED ORDER — MIDAZOLAM HCL 2 MG/2ML IJ SOLN
INTRAMUSCULAR | Status: AC
  Filled 2024-07-16: qty 2

## 2024-07-16 MED ORDER — POLYETHYLENE GLYCOL 3350 17 G PO PACK
17.0000 g | PACK | Freq: Two times a day (BID) | ORAL | Status: DC
  Administered 2024-07-16 – 2024-07-18 (×5): 17 g via ORAL
  Filled 2024-07-16 (×6): qty 1

## 2024-07-16 MED ORDER — THIAMINE MONONITRATE 100 MG PO TABS
100.0000 mg | ORAL_TABLET | Freq: Every day | ORAL | Status: DC
  Administered 2024-07-17: 100 mg via ORAL
  Filled 2024-07-16 (×2): qty 1

## 2024-07-16 MED ORDER — ADULT MULTIVITAMIN W/MINERALS CH
1.0000 | ORAL_TABLET | Freq: Every day | ORAL | Status: DC
  Administered 2024-07-17: 1 via ORAL
  Filled 2024-07-16 (×2): qty 1

## 2024-07-16 MED ORDER — LACTATED RINGERS IV SOLN: INTRAVENOUS | Status: AC

## 2024-07-16 MED ORDER — ONDANSETRON HCL 4 MG PO TABS: 4.0000 mg | ORAL_TABLET | Freq: Four times a day (QID) | ORAL | Status: DC | PRN

## 2024-07-16 MED ORDER — FLUCONAZOLE 100 MG PO TABS
100.0000 mg | ORAL_TABLET | Freq: Every day | ORAL | Status: DC
  Administered 2024-07-17 – 2024-07-18 (×2): 100 mg via ORAL
  Filled 2024-07-16 (×3): qty 1

## 2024-07-16 MED ORDER — MIDAZOLAM HCL 2 MG/2ML IJ SOLN
INTRAMUSCULAR | Status: DC | PRN
  Administered 2024-07-16: 2 mg via INTRAVENOUS

## 2024-07-16 MED ORDER — CIPROFLOXACIN HCL 0.3 % OP SOLN
1.0000 [drp] | OPHTHALMIC | Status: DC
  Administered 2024-07-16 – 2024-07-17 (×7): 1 [drp] via OPHTHALMIC
  Filled 2024-07-16: qty 2.5

## 2024-07-16 MED ORDER — NICOTINE 14 MG/24HR TD PT24
14.0000 mg | MEDICATED_PATCH | Freq: Every day | TRANSDERMAL | Status: DC
  Administered 2024-07-16 – 2024-07-18 (×3): 14 mg via TRANSDERMAL
  Filled 2024-07-16 (×4): qty 1

## 2024-07-16 MED ORDER — SODIUM CHLORIDE 0.9 % IV SOLN: INTRAVENOUS | Status: DC

## 2024-07-16 MED ORDER — MAGNESIUM SULFATE 2 GM/50ML IV SOLN
2.0000 g | Freq: Once | INTRAVENOUS | Status: AC
  Administered 2024-07-16: 2 g via INTRAVENOUS
  Filled 2024-07-16: qty 50

## 2024-07-16 MED ORDER — PROPOFOL 10 MG/ML IV BOLUS
INTRAVENOUS | Status: DC | PRN
Start: 2024-07-16 — End: 2024-07-16
  Administered 2024-07-16 (×2): 30 mg via INTRAVENOUS
  Administered 2024-07-16: 10 mg via INTRAVENOUS
  Administered 2024-07-16: 30 mg via INTRAVENOUS

## 2024-07-16 MED ORDER — FLUCONAZOLE IN SODIUM CHLORIDE 400-0.9 MG/200ML-% IV SOLN
400.0000 mg | Freq: Once | INTRAVENOUS | Status: AC
  Administered 2024-07-16: 400 mg via INTRAVENOUS
  Filled 2024-07-16: qty 200

## 2024-07-16 MED ORDER — THIAMINE HCL 100 MG/ML IJ SOLN
100.0000 mg | Freq: Every day | INTRAMUSCULAR | Status: DC
  Filled 2024-07-16: qty 2

## 2024-07-16 MED ORDER — PANTOPRAZOLE SODIUM 40 MG IV SOLR
40.0000 mg | Freq: Two times a day (BID) | INTRAVENOUS | Status: DC
  Administered 2024-07-16 – 2024-07-17 (×3): 40 mg via INTRAVENOUS
  Filled 2024-07-16 (×3): qty 10

## 2024-07-16 MED ORDER — POLYVINYL ALCOHOL 1.4 % OP SOLN
1.0000 [drp] | OPHTHALMIC | Status: DC | PRN
  Administered 2024-07-16: 1 [drp] via OPHTHALMIC
  Filled 2024-07-16: qty 15

## 2024-07-16 MED ORDER — PROPOFOL 500 MG/50ML IV EMUL
INTRAVENOUS | Status: DC | PRN
  Administered 2024-07-16: 180 ug/kg/min via INTRAVENOUS

## 2024-07-16 MED ORDER — ORAL CARE MOUTH RINSE
15.0000 mL | OROMUCOSAL | Status: DC | PRN
Start: 2024-07-16 — End: 2024-07-19

## 2024-07-16 MED ORDER — ONDANSETRON HCL 4 MG/2ML IJ SOLN: 4.0000 mg | Freq: Four times a day (QID) | INTRAMUSCULAR | Status: DC | PRN

## 2024-07-16 MED ORDER — OXYCODONE-ACETAMINOPHEN 5-325 MG PO TABS
1.0000 | ORAL_TABLET | Freq: Three times a day (TID) | ORAL | Status: DC | PRN
  Administered 2024-07-17 – 2024-07-19 (×4): 1 via ORAL
  Filled 2024-07-16 (×4): qty 1

## 2024-07-16 MED ORDER — SODIUM CHLORIDE 0.9% FLUSH
3.0000 mL | Freq: Two times a day (BID) | INTRAVENOUS | Status: DC
  Administered 2024-07-16 – 2024-07-18 (×5): 3 mL via INTRAVENOUS

## 2024-07-16 MED ORDER — LEVETIRACETAM 250 MG PO TABS
375.0000 mg | ORAL_TABLET | Freq: Two times a day (BID) | ORAL | Status: DC
  Administered 2024-07-16 – 2024-07-19 (×7): 375 mg via ORAL
  Filled 2024-07-16 (×8): qty 1.5

## 2024-07-16 MED ORDER — LORAZEPAM 2 MG/ML IJ SOLN: 1.0000 mg | INTRAMUSCULAR | Status: DC | PRN

## 2024-07-16 MED ORDER — FOLIC ACID 1 MG PO TABS
1.0000 mg | ORAL_TABLET | Freq: Every day | ORAL | Status: DC
  Administered 2024-07-17: 1 mg via ORAL
  Filled 2024-07-16 (×2): qty 1

## 2024-07-16 MED ORDER — ACETAMINOPHEN 325 MG PO TABS: 650.0000 mg | ORAL_TABLET | Freq: Four times a day (QID) | ORAL | Status: DC | PRN

## 2024-07-16 MED ORDER — ACETAMINOPHEN 650 MG RE SUPP: 650.0000 mg | Freq: Four times a day (QID) | RECTAL | Status: DC | PRN

## 2024-07-16 NOTE — Anesthesia Preprocedure Evaluation (Addendum)
 Anesthesia Evaluation  Patient identified by MRN, date of birth, ID band Patient awake    Reviewed: Allergy & Precautions, NPO status , Patient's Chart, lab work & pertinent test results  History of Anesthesia Complications Negative for: history of anesthetic complications  Airway Mallampati: III  TM Distance: >3 FB Neck ROM: Full    Dental  (+) Edentulous Upper, Missing, Poor Dentition, Dental Advisory Given   Pulmonary asthma , Current Smoker and Patient abstained from smoking.   breath sounds clear to auscultation       Cardiovascular hypertension (no meds), (-) angina +CHF   Rhythm:Regular Rate:Normal   '21 TTE - Appears to have abnormal septal motion and mild diffuse hypokinesis . EF is 45 to 50%. Global hypokinesis. No significant valvular d/o identified.     Neuro/Psych Seizures - (keppra , didnt take 2d, Sz Friday),  CVA    GI/Hepatic ,GERD  Medicated and Controlled,,(+)     substance abuse (pt does not know when his last use was)  alcohol  use and cocaine useEmesis saturday   Endo/Other  negative endocrine ROS    Renal/GU negative Renal ROS     Musculoskeletal negative musculoskeletal ROS (+)    Abdominal   Peds  Hematology  (+) Blood dyscrasia, anemia  Plt 59k INR 1.3    Anesthesia Other Findings   Reproductive/Obstetrics                              Anesthesia Physical Anesthesia Plan  ASA: 3  Anesthesia Plan: MAC   Post-op Pain Management: Minimal or no pain anticipated   Induction:   PONV Risk Score and Plan: 1 and Propofol  infusion and Treatment may vary due to age or medical condition  Airway Management Planned: Nasal Cannula and Natural Airway  Additional Equipment: None  Intra-op Plan:   Post-operative Plan:   Informed Consent: I have reviewed the patients History and Physical, chart, labs and discussed the procedure including the risks, benefits and  alternatives for the proposed anesthesia with the patient or authorized representative who has indicated his/her understanding and acceptance.     Dental advisory given  Plan Discussed with: CRNA and Surgeon  Anesthesia Plan Comments:          Anesthesia Quick Evaluation

## 2024-07-16 NOTE — ED Notes (Signed)
 This nurse called CCMD to have patient monitored.

## 2024-07-16 NOTE — Op Note (Addendum)
 [facility] Patient Name: [name] Procedure Date : 07/16/2024 MRN: [id number] Attending MD: Gordy CHRISTELLA Starch , MD, [id number] Date of Birth: October 25, 1981 CSN: [id number] Age: 43 Admit Type: Inpatient Procedure:                Upper GI endoscopy Indications:              Coffee-ground emesis, chronic alcohol  and substance                            abuse, chronic thrombocytopenia and concern for                            portal hypertension and underlying chronic liver                            disease Providers:                Gordy CHRISTELLA. Starch, MD, Randall Lines, RN, Lorrayne Kitty,                            Technician Referring MD:             [facility] Medicines:                Monitored Anesthesia Care Complications:            No immediate complications. Estimated Blood Loss:     Estimated blood loss was minimal. Procedure:                Pre-Anesthesia Assessment:                           - Prior to the procedure, a History and Physical                            was performed, and patient medications and                            allergies were reviewed. The patient's tolerance of                            previous anesthesia was also reviewed. The risks                            and benefits of the procedure and the sedation                            options and risks were discussed with the patient.                            All questions were answered, and informed consent                            was obtained. Prior Anticoagulants: The patient has  taken no anticoagulant or antiplatelet agents. ASA                            Grade Assessment: III - A patient with severe                            systemic disease. After reviewing the risks and                            benefits, the patient was deemed in satisfactory                            condition to undergo the procedure.                           After  obtaining informed consent, the endoscope was                            passed under direct vision. Throughout the                            procedure, the patient's blood pressure, pulse, and                            oxygen saturations were monitored continuously. The                            [id number]-H190 ([id number]) Olympus endoscope was introduced                            through the mouth, and advanced to the second part                            of duodenum. The upper GI endoscopy was                            accomplished without difficulty. The patient                            tolerated the procedure well. Scope In: Scope Out: Findings:      Diffuse, white plaques were found in the entire esophagus.      There is no endoscopic evidence of varices in the entire esophagus.      Diffuse moderate inflammation characterized by congestion (edema),       erosions, friability and granularity was found in the entire examined       stomach. Biopsies were taken with a cold forceps for histology and       Helicobacter pylori testing.      Mild inflammation characterized by erythema was found in the duodenal       bulb.      The second portion of the duodenum was normal. Impression:               - Esophageal plaques were found, consistent with  candidiasis.                           - Erosive gastritis. Biopsied.                           - Non-erosive, bulbar, duodenitis.                           - Normal second portion of the duodenum.                           - No evidence of esophageal or gastric varices. Moderate Sedation:      N/A Recommendation:           - Return patient to hospital ward for ongoing care.                           - Advance diet as tolerated.                           - Continue present medications. BID PPI is                            recommended while here and then once daily at                            discharge.                            - Fluconazole  200 mg x 1 day, 100 mg x 20 days for                            Candida esophagitis.                           - Await pathology results to exclude H. Pylori                            infection. If present antibiotics will be                            recommended.                           - High suspicion of underlying cirrhosis in setting                            of alcohol  use disorder (imaging, thrombocytopenia,                            coagulopathy, clinical history of long-stand                            alcohol  abuse). Liver clinic referral is  recommended, but alcohol  and illicit drug use needs                            to be stopped. Procedure Code(s):        --- Professional ---                           [phone], Esophagogastroduodenoscopy, flexible,                            transoral; with biopsy, single or multiple Diagnosis Code(s):        --- Professional ---                           K22.9, Disease of esophagus, unspecified                           K29.70, Gastritis, unspecified, without bleeding                           K29.80, Duodenitis without bleeding                           K92.0, Hematemesis CPT copyright 2022 [employer]. All rights reserved. The codes documented in this report are preliminary and upon coder review may  be revised to meet current compliance requirements. Gordy CHRISTELLA Starch, MD 07/16/2024 11:43:58 AM This report has been signed electronically. Number of Addenda: 0
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
## 2024-07-16 NOTE — Progress Notes (Signed)
 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-50% 2021 echo-Sz disorder prior,  current use EtOH--2 icehouse bottles per day, chronic TCP secondary to this Status post left BKA and + L hip AVN 8/4 1 day h/o generalized abdominal pain N/V coffee-ground emesis appearing--he had palpable tenderness across epigastrium left upper quadrant after an apparent fall 1 week prior and also headaches and LOC resulting in imaging as below Tachycardic slightly hypotensive on arrival fluid boluses given Hemoglobin 7.5 platelet 29 WBC 7.5 AST/ALT 87/23 alk phos 59 INR 1.4 bili 2.4 CT ABD pelvis cholelithiasis 4 cm ascending thoracic aortic aneurysm hepatic steatosis CT head CT cervical spine negative 1 unit PRBC given,?  1 unit platelet  Plan  Acute upper GI bleed transfused as above GI aware of patient-LR 100 cc/H octreotide  20 5ML/H, Protonix  IV 40 twice daily   severe hypokalemia hypomagnesemia and alcoholic ketosisfrom EtOH Replaced aggressive magnesium  repleted additionally Trend labs a.m.  EtOH liver disease-albumin  2.4 bilirubin 2.9 indicative of more chronic process-baseline AST ALT usually is higher Do not think this is acute hepatitis-imaging more concerning for steatosis MELD score 18 = 3 to 4% 90-day mortality, child Pugh = C = life expectancy 1 to 3 years??  Need transplantation discussion versus goals of care Defer to GI--have asked them to comment Watch for withdrawals-EtOH level on admission less than 15 raising concern for imminent withdrawal-protocol in place-currently is CIWA 0 currently  Cocaine habituation Needs to quit unclear his readiness for this Give nicotine  patch  Conjunctivitis?-Eyedrops  Status post left BKA LL AVN hip Cautious  Percocet 1 tab every 8 as needed    Data Reviewed:   Sodium 133 Potassium 2.6-->4.5 CO2 17-->19 BUN/creatinine 8/0.9 Anion gap 13 magnesium  1.2-->2.0 Bilirubin 2.9  DVT prophylaxis: Contraindicated SCD  Status is: Inpatient Remains inpatient appropriate because:   Needs workup inclusive of EGD     Subjective: Does not know which GI he has seen in the past has never visited-lives with his home boy at home-[name] is in charge of him Seems to be a little evasive about cocaine positive UDS Last drink was 3 days ago No current chest pain or abdominal pain No vomiting since yesterday-asking for nicotine  patch    Objective + exam Vitals:   07/16/24 0145 07/16/24 0415 07/16/24 0418 07/16/24 0628  BP: 95/67 (!) 142/90 (!) 142/90 124/83  Pulse: 69 88 88 89  Resp: 19 18  18   Temp:  98.6 F (37 C)  98.3 F (36.8 C)  TempSrc:  Oral  Oral  SpO2: 100% 100%  100%  Weight:      Height:       Filed Weights   07/15/24 1459  Weight: 83.6 kg     Examination: Disheveled black male looks older than stated age EOMI NCAT S1-S2 no murmur slight JVD Chest clear and anterolateral fields Abdomen slightly tender epigastrium?  HSM noted Left BKA noted slightly dusky on stump No lower extremity edema     Scheduled Meds:  sodium chloride    Intravenous Once   folic acid   1 mg Oral Daily   levETIRAcetam   375 mg Oral BID   multivitamin with minerals  1 tablet Oral Daily  pantoprazole  (PROTONIX ) IV  40 mg Intravenous Q12H   sodium chloride  flush  3 mL Intravenous Q12H   thiamine   100 mg Oral Daily   Or   thiamine   100 mg Intravenous Daily   Continuous Infusions:  lactated ringers  100 mL/hr at 07/16/24 0320   octreotide  (SANDOSTATIN ) 500 mcg in sodium chloride  0.9 % 250 mL (2 mcg/mL) infusion 50 mcg/hr (07/16/24 0628)    Time 50 minutes  Nathaniel Anner Baity, MD  [facility]

## 2024-07-16 NOTE — ED Notes (Signed)
 Patient has drainage from his eyes , warm washcloth given to wipe his face.

## 2024-07-16 NOTE — Hospital Course (Addendum)
 Impression:               - Esophageal plaques were found, consistent with                            candidiasis.                           - Erosive gastritis. Biopsied.                           - Non-erosive, bulbar, duodenitis.                           - Normal second portion of the duodenum.                           - No evidence of esophageal or gastric varices. Moderate Sedation:      N/A Recommendation:           - Return patient to hospital ward for ongoing care.                           - Advance diet as tolerated.                           - Continue present medications. BID PPI is                            recommended while here and then once daily at                            discharge.                           - Fluconazole  200 mg x 1 day, 100 mg x 20 days for                            Candida esophagitis.                           - Await pathology results to exclude H. Pylori                            infection. If present antibiotics will be                            recommended.                           - High suspicion of underlying cirrhosis in setting                            of alcohol  use disorder (imaging, thrombocytopenia,                            coagulopathy, clinical history of long-stand  alcohol  abuse). Liver clinic referral is                            recommended, but alcohol  and illicit drug use needs                            to be stopped.
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
## 2024-07-16 NOTE — ED Notes (Signed)
 CCMD called.

## 2024-07-16 NOTE — Anesthesia Postprocedure Evaluation (Signed)
 Anesthesia Post Note  Patient: [name]  Procedure(s) Performed: EGD (ESOPHAGOGASTRODUODENOSCOPY)     Patient location during evaluation: Endoscopy Anesthesia Type: MAC Level of consciousness: oriented, patient cooperative and awake and alert Pain management: pain level controlled Vital Signs Assessment: post-procedure vital signs reviewed and stable Respiratory status: spontaneous breathing, nonlabored ventilation and respiratory function stable Cardiovascular status: blood pressure returned to baseline and stable Postop Assessment: no apparent nausea or vomiting Anesthetic complications: no   There were no known notable events for this encounter.  Last Vitals:  Vitals:   07/16/24 1148 07/16/24 1149  BP:  115/85  Pulse:  87  Resp:  19  Temp:    SpO2: 95%     Last Pain:  Vitals:   07/16/24 1149  TempSrc:   PainSc: 0-No pain                 Nour Scalise,E. Ingvald Theisen

## 2024-07-16 NOTE — Transfer of Care (Signed)
 Immediate Anesthesia Transfer of Care Note  Patient: [name]  Procedure(s) Performed: EGD (ESOPHAGOGASTRODUODENOSCOPY)  Patient Location: Endoscopy Unit  Anesthesia Type:MAC  Level of Consciousness: awake, alert , and oriented  Airway & Oxygen Therapy: Patient Spontanous Breathing and Patient connected to nasal cannula oxygen  Post-op Assessment: Report given to RN and Post -op Vital signs reviewed and stable  Post vital signs: Reviewed and stable  Last Vitals:  Vitals Value Taken Time  BP 118/73 07/16/24 11:34  Temp 36.6 C 07/16/24 11:34  Pulse 102 07/16/24 11:37  Resp 24 07/16/24 11:37  SpO2 99 % 07/16/24 11:37  Vitals shown include unfiled device data.  Last Pain:  Vitals:   07/16/24 1134  TempSrc: Temporal  PainSc: Asleep         Complications: No notable events documented.

## 2024-07-16 NOTE — Plan of Care (Signed)
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
## 2024-07-17 ENCOUNTER — Encounter (HOSPITAL_COMMUNITY): Payer: Self-pay | Admitting: Internal Medicine

## 2024-07-17 DIAGNOSIS — K2960 Other gastritis without bleeding: Secondary | ICD-10-CM

## 2024-07-17 DIAGNOSIS — R7989 Other specified abnormal findings of blood chemistry: Secondary | ICD-10-CM

## 2024-07-17 DIAGNOSIS — F1490 Cocaine use, unspecified, uncomplicated: Secondary | ICD-10-CM

## 2024-07-17 DIAGNOSIS — K922 Gastrointestinal hemorrhage, unspecified: Secondary | ICD-10-CM | POA: Diagnosis not present

## 2024-07-17 DIAGNOSIS — F1010 Alcohol abuse, uncomplicated: Secondary | ICD-10-CM

## 2024-07-17 LAB — CBC WITH DIFFERENTIAL/PLATELET
Abs Granulocyte: 4.3 K/uL (ref 1.5–6.5)
Abs Immature Granulocytes: 0.05 K/uL (ref 0.00–0.07)
Basophils Absolute: 0 K/uL (ref 0.0–0.1)
Basophils Relative: 0 %
Eosinophils Absolute: 0.2 K/uL (ref 0.0–0.5)
Eosinophils Relative: 2 %
HCT: 27.2 % — ABNORMAL LOW (ref 39.0–52.0)
Hemoglobin: 9.3 g/dL — ABNORMAL LOW (ref 13.0–17.0)
Immature Granulocytes: 1 %
Lymphocytes Relative: 35 %
Lymphs Abs: 3.3 K/uL (ref 0.7–4.0)
MCH: 33.2 pg (ref 26.0–34.0)
MCHC: 34.2 g/dL (ref 30.0–36.0)
MCV: 97.1 fL (ref 80.0–100.0)
Monocytes Absolute: 1.5 K/uL — ABNORMAL HIGH (ref 0.1–1.0)
Monocytes Relative: 16 %
Neutro Abs: 4.3 K/uL (ref 1.7–7.7)
Neutrophils Relative %: 46 %
Platelets: 62 K/uL — ABNORMAL LOW (ref 150–400)
RBC: 2.8 MIL/uL — ABNORMAL LOW (ref 4.22–5.81)
RDW: 22.9 % — ABNORMAL HIGH (ref 11.5–15.5)
Smear Review: NORMAL
WBC: 9.2 K/uL (ref 4.0–10.5)
nRBC: 1 % — ABNORMAL HIGH (ref 0.0–0.2)

## 2024-07-17 LAB — COMPREHENSIVE METABOLIC PANEL WITH GFR
ALT: 24 U/L (ref 0–44)
AST: 64 U/L — ABNORMAL HIGH (ref 15–41)
Albumin: 2.4 g/dL — ABNORMAL LOW (ref 3.5–5.0)
Alkaline Phosphatase: 73 U/L (ref 38–126)
Anion gap: 9 (ref 5–15)
BUN: 5 mg/dL — ABNORMAL LOW (ref 6–20)
CO2: 25 mmol/L (ref 22–32)
Calcium: 8.7 mg/dL — ABNORMAL LOW (ref 8.9–10.3)
Chloride: 98 mmol/L (ref 98–111)
Creatinine, Ser: 0.8 mg/dL (ref 0.61–1.24)
GFR, Estimated: >60 mL/min (ref >60)
Glucose, Bld: 137 mg/dL — ABNORMAL HIGH (ref 70–99)
Potassium: 3.4 mmol/L — ABNORMAL LOW (ref 3.5–5.1)
Sodium: 132 mmol/L — ABNORMAL LOW (ref 135–145)
Total Bilirubin: 2.7 mg/dL — ABNORMAL HIGH (ref 0.0–1.2)
Total Protein: 6.6 g/dL (ref 6.5–8.1)

## 2024-07-17 MED ORDER — ALBUMIN HUMAN 25 % IV SOLN
50.0000 g | Freq: Once | INTRAVENOUS | Status: AC
  Administered 2024-07-17: 50 g via INTRAVENOUS
  Filled 2024-07-17: qty 200

## 2024-07-17 MED ORDER — CIPROFLOXACIN HCL 0.3 % OP SOLN
1.0000 [drp] | OPHTHALMIC | Status: DC
  Administered 2024-07-17 – 2024-07-19 (×16): 1 [drp] via OPHTHALMIC
  Filled 2024-07-17: qty 2.5

## 2024-07-17 MED ORDER — SODIUM CHLORIDE 0.9 % IV BOLUS: 500.0000 mL | Freq: Once | INTRAVENOUS | Status: DC

## 2024-07-17 MED ORDER — CLONIDINE HCL 0.1 MG PO TABS
0.1000 mg | ORAL_TABLET | Freq: Two times a day (BID) | ORAL | Status: DC
  Administered 2024-07-17: 0.1 mg via ORAL
  Filled 2024-07-17: qty 1

## 2024-07-17 MED ORDER — PANTOPRAZOLE SODIUM 40 MG PO TBEC
40.0000 mg | DELAYED_RELEASE_TABLET | Freq: Two times a day (BID) | ORAL | Status: DC
  Administered 2024-07-17 – 2024-07-19 (×5): 40 mg via ORAL
  Filled 2024-07-17 (×5): qty 1

## 2024-07-17 MED ORDER — SODIUM CHLORIDE 0.9 % IV BOLUS
500.0000 mL | Freq: Once | INTRAVENOUS | Status: AC
  Administered 2024-07-17: 500 mL via INTRAVENOUS

## 2024-07-17 MED ORDER — DOXYCYCLINE HYCLATE 100 MG PO TABS
100.0000 mg | ORAL_TABLET | Freq: Two times a day (BID) | ORAL | Status: DC
  Administered 2024-07-17 – 2024-07-19 (×4): 100 mg via ORAL
  Filled 2024-07-17 (×4): qty 1

## 2024-07-17 MED ORDER — METOPROLOL TARTRATE 12.5 MG HALF TABLET
12.5000 mg | ORAL_TABLET | Freq: Two times a day (BID) | ORAL | Status: DC
  Filled 2024-07-17: qty 1

## 2024-07-17 MED ORDER — PROPRANOLOL HCL 10 MG PO TABS: 5.0000 mg | ORAL_TABLET | Freq: Two times a day (BID) | ORAL | Status: DC

## 2024-07-17 MED ORDER — MIDODRINE HCL 5 MG PO TABS
5.0000 mg | ORAL_TABLET | Freq: Three times a day (TID) | ORAL | Status: DC
  Administered 2024-07-17 – 2024-07-19 (×5): 5 mg via ORAL
  Filled 2024-07-17 (×5): qty 1

## 2024-07-17 MED ORDER — SODIUM CHLORIDE 0.9 % IV SOLN: 2.0000 g | INTRAVENOUS | Status: DC

## 2024-07-17 MED ORDER — SODIUM CHLORIDE 0.9 % IV SOLN
2.0000 g | Freq: Once | INTRAVENOUS | Status: AC
  Administered 2024-07-17: 2 g via INTRAVENOUS
  Filled 2024-07-17: qty 20

## 2024-07-17 MED ORDER — PROPRANOLOL HCL 10 MG PO TABS: 10.0000 mg | ORAL_TABLET | Freq: Two times a day (BID) | ORAL | Status: DC

## 2024-07-17 MED ORDER — NYSTATIN 100000 UNIT/ML MT SUSP
5.0000 mL | Freq: Four times a day (QID) | OROMUCOSAL | Status: DC
  Administered 2024-07-17 – 2024-07-18 (×6): 500000 [IU] via ORAL
  Filled 2024-07-17 (×9): qty 5
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
## 2024-07-17 NOTE — Progress Notes (Signed)
   07/17/24 1333  Assess: MEWS Score  Temp 100.3 F (37.9 C)  BP (!) 85/58  MAP (mmHg) 67  Pulse Rate (!) 126  ECG Heart Rate (!) 130  Resp 20  SpO2 100 %  O2 Device Room Air  Assess: MEWS Score  MEWS Temp 0  MEWS Systolic 1  MEWS Pulse 3  MEWS RR 0  MEWS LOC 0  MEWS Score 4  MEWS Score Color Red  Assess: if the MEWS score is Yellow or Red  Were vital signs accurate and taken at a resting state? Yes  Does the patient meet 2 or more of the SIRS criteria? Yes  Does the patient have a confirmed or suspected source of infection? No  MEWS guidelines implemented  Yes, red  Treat  MEWS Interventions Considered administering scheduled or prn medications/treatments as ordered  Take Vital Signs  Increase Vital Sign Frequency  Red: Q1hr x2, continue Q4hrs until patient remains green for 12hrs  Escalate  MEWS: Escalate Red: Discuss with charge nurse and notify provider. Consider notifying RRT. If remains red for 2 hours consider need for higher level of care  Notify: Charge Nurse/RN  Name of Charge Nurse/RN Notified sandra  Provider Notification  Provider Name/Title samtani  Date Provider Notified 07/17/24  Time Provider Notified 1339  Method of Notification Page  Notification Reason Other (Comment) (change in vitals)  Provider response No new orders  Date of Provider Response 07/17/24  Time of Provider Response 1340  Assess: SIRS CRITERIA  SIRS Temperature  0  SIRS Respirations  0  SIRS Pulse 1  SIRS WBC 0  SIRS Score Sum  1

## 2024-07-17 NOTE — Progress Notes (Addendum)
 Called by nursing as after being given 1 dose of clonidine  patient became hypotensive MAP into the 60s Evaluated at bedside not terribly symptomatic seems awake alert not in distress not diaphoretic no dizziness He is actually asking if he can go home tomorrow!  Called his sister and discussed with him-I do not think he is completely stable for discharge-I do think that his tachycardia may be anxiety invoked--I will change him to nonselective beta-blocker propranolol  very low-dose in the morning  We will start him on midodrine  5, give 500 cc bolus and will also give albumin  50 He is not ascitic or tympanitic in his belly  I frankly mentioned to him that he really needs to quit his habits or they will kill him.  CRITICAL CARE Performed by: Jai-Gurmukh Ayub Kirsh   Total critical care time: 15 minutes  Critical care time was exclusive of separately billable procedures and treating other patients.  Critical care was necessary to treat or prevent imminent or life-threatening deterioration.  Critical care was time spent personally by me on the following activities: development of treatment plan with patient and/or surrogate as well as nursing, discussions with consultants, evaluation of patient's response to treatment, examination of patient, obtaining history from patient or surrogate, ordering and performing treatments and interventions, ordering and review of laboratory studies, ordering and review of radiographic studies, pulse oximetry and re-evaluation of patient's condition.   Jai Haitham Dolinsky, MD [facility] 2:37 PM

## 2024-07-17 NOTE — Progress Notes (Signed)
 Received a call from Dr Samtani about concern for [facility] corneal involvement. Plan to increase cipro  drops to q2h and give Ceftriaxone  2g IV x1 + 7d doxy. ID to see in AM.  Sergio Batch, PharmD, BCIDP, AAHIVP, CPP Infectious Disease Pharmacist 07/17/2024 6:56 PM

## 2024-07-17 NOTE — Progress Notes (Signed)
 Daily Progress Note  DOA: 07/15/2024 Hospital Day: 3   Patient Profile:   43 year old male with a history of EtOH abuse complicated by withdrawal seizures, hepatic steatosis , diverticulosis , cholelithiasis , chronic pain , chronic anemia and thrombocytopenia.  See below and also PMH for additional medical history. Patient admitted with abdominal pain and coffee-ground emesis.   ASSESSMENT    Coffee ground emesis Erosive gastritis Gastric biopsies pending  Acute hypotension / tachycardia Unclear why sudden change in hemodynamics. Mentation normal. He did get Catapress around 1pm today for tachycardia ( now discontinued). Catapress probably dropping his BP. He has just received first dose of Midodrine . No evidence for GI bleeding. H/H is stable  Mildly elevated LFTs / Etoh use Mild coagulopathy Chronic thrombocytopenia Non-cirrhotic appearing liver on CT scan but US  Aug 2024 suggested cirrhosis. If so then may be etoh related. Viral Hep B, C negative.    Esophageal candidiasis  Illicit drug use UDS positive for cocaine  Principal Problem:   Acute upper GI bleeding Active Problems:   Hypokalemia   Thrombocytopenia (HCC)   Seizure disorder (HCC)   Acute blood loss anemia (ABLA)   History of alcohol  abuse   Substance abuse (HCC)   Gastritis and gastroduodenitis   Candida esophagitis (HCC)   PLAN   --Continue fluconazole  --Await gastric bx to check for H.pylori --Continue BID  PPI --Probably need to hold off on starting Inderal  until hemodynamically stable ( looks like it is suppose to start tomorrow)' --Further evaluation for cause of possible cirrhosis can be done at outpatient follow up  Subjective   No complaints. He has no abdominal pain. No N/V today. Did have one BM today but he didn't look at color and it was flushed.   Objective   GI Studies:    Recent Labs    07/15/24 1614 07/16/24 0501 07/17/24 0351  WBC 7.5 8.3 9.2  HGB 7.5* 9.7* 9.3*   HCT 22.4* 28.7* 27.2*  MCV 98.7 101.1* 97.1  PLT 29* 59* 62*   No results for input(s): FOLATE, VITAMINB12, FERRITIN, TIBC, IRONPCTSAT in the last 72 hours. Recent Labs    07/15/24 1614 07/16/24 0501 07/17/24 0351  NA 137 133* 132*  K 2.6* 4.5 3.4*  CL 109 101 98  CO2 17* 19* 25  GLUCOSE 68* 93 137*  BUN 7 8 5*  CREATININE 0.78 0.97 0.80  CALCIUM  6.9* 8.8* 8.7*   Recent Labs    07/15/24 1614 07/16/24 0501 07/17/24 0351  PROT 6.1* 6.9 6.6  ALBUMIN  2.1* 2.4* 2.4*  AST 87* 86* 64*  ALT 23 24 24   ALKPHOS 59 71 73  BILITOT 2.4* 2.9* 2.7*      Imaging:  CT CHEST ABDOMEN PELVIS W CONTRAST CLINICAL DATA:  Clemens, left lower rib pain, hematemesis  EXAM: CT CHEST, ABDOMEN, AND PELVIS WITH CONTRAST  TECHNIQUE: Multidetector CT imaging of the chest, abdomen and pelvis was performed following the standard protocol during bolus administration of intravenous contrast.  RADIATION DOSE REDUCTION: This exam was performed according to the departmental dose-optimization program which includes automated exposure control, adjustment of the mA and/or kV according to patient size and/or use of iterative reconstruction technique.  CONTRAST:  75mL OMNIPAQUE  IOHEXOL  350 MG/ML SOLN  COMPARISON:  09/21/2023, 04/27/2012  FINDINGS: CT CHEST FINDINGS  Cardiovascular: The heart is unremarkable without pericardial effusion. 4 cm ascending thoracic aortic aneurysm with no evidence of dissection. Atherosclerosis of the aorta and coronary vasculature.  Mediastinum/Nodes: No enlarged mediastinal, hilar,  or axillary lymph nodes. Thyroid gland, trachea, and esophagus demonstrate no significant findings.  Lungs/Pleura: No acute airspace disease, effusion, or pneumothorax. Central airways are patent.  Musculoskeletal: Multiple prior healed left lower rib fractures. No acute or destructive bony abnormalities. Reconstructed images demonstrate no additional findings.  CT  ABDOMEN PELVIS FINDINGS  Hepatobiliary: Hepatic steatosis. No focal liver abnormality or biliary duct dilation. Multiple small gallstones layer dependently within a distended gallbladder, with no gallbladder wall thickening or pericholecystic fat stranding to suggest cholecystitis.  Pancreas: Unremarkable. No pancreatic ductal dilatation or surrounding inflammatory changes.  Spleen: Normal in size without focal abnormality.  Adrenals/Urinary Tract: Multiple simple bilateral renal cortical cysts do not require specific imaging follow-up. No urinary tract calculi or obstructive uropathy. The adrenals and bladder are unremarkable.  Stomach/Bowel: No bowel obstruction or ileus. Normal retrocecal appendix. Scattered colonic diverticulosis without evidence of acute diverticulitis. No bowel wall thickening or inflammatory change.  Vascular/Lymphatic: Aortic atherosclerosis. No enlarged abdominal or pelvic lymph nodes.  Reproductive: Prostate is unremarkable.  Other: No free fluid or free intraperitoneal gas. No abdominal wall hernia.  Musculoskeletal: No acute or destructive bony abnormalities. Postsurgical changes from left hip ORIF. Avascular necrosis of the left femoral head without evidence of subchondral collapse.  IMPRESSION: 1. No acute intrathoracic, intra-abdominal, or intrapelvic trauma. 2. Cholelithiasis without evidence of acute cholecystitis. 3. Scattered colonic diverticulosis without evidence of acute diverticulitis. 4. 4 cm ascending thoracic aortic aneurysm. Recommend annual imaging followup by CTA or MRA. This recommendation follows 2010 ACCF/AHA/AATS/ACR/ASA/SCA/SCAI/SIR/STS/SVM Guidelines for the Diagnosis and Management of Patients with Thoracic Aortic Disease. Circulation. 2010; 121: Z733-z630. Aortic aneurysm NOS (ICD10-I71.9) 5. Hepatic steatosis. 6. Aortic Atherosclerosis (ICD10-I70.0). Coronary artery atherosclerosis.  Electronically Signed   By:  Ozell Daring M.D.   On: 07/15/2024 19:41 CT Head Wo Contrast EXAM: CT HEAD AND CERVICAL SPINE 07/15/2024 07:02:08 PM  TECHNIQUE: CT of the head and cervical spine was performed without the administration of intravenous contrast. Multiplanar reformatted images are provided for review. Automated exposure control, iterative reconstruction, and/or weight based adjustment of the mA/kV was utilized to reduce the radiation dose to as low as reasonably achievable.  COMPARISON: None available.  CLINICAL HISTORY: Fall, loss of consciousness. Patient from home with EMS, vomiting started yesterday, today was coffee ground like. Complains of generalized abdominal pain. History of alcohol  abuse.  FINDINGS: CT HEAD  BRAIN AND VENTRICLES: No acute intracranial hemorrhage. No mass effect or midline shift. No abnormal extra-axial fluid collection. Gray-white differentiation is maintained. No hydrocephalus.  ORBITS: No acute abnormality.  SINUSES AND MASTOIDS: No acute abnormality.  SOFT TISSUES AND SKULL: No acute skull fracture. No acute soft tissue abnormality.  CT CERVICAL SPINE  BONES AND ALIGNMENT: No acute fracture or traumatic malalignment.  DEGENERATIVE CHANGES: No significant degenerative changes.  SOFT TISSUES: No prevertebral soft tissue swelling.  IMPRESSION: 1. No acute intracranial abnormality. 2. No acute fracture or traumatic malalignment of the cervical spine.  Electronically signed by: Franky Stanford MD 07/15/2024 07:15 PM EDT RP Workstation: [id number] CT Cervical Spine Wo Contrast EXAM: CT HEAD AND CERVICAL SPINE 07/15/2024 07:02:08 PM  TECHNIQUE: CT of the head and cervical spine was performed without the administration of intravenous contrast. Multiplanar reformatted images are provided for review. Automated exposure control, iterative reconstruction, and/or weight based adjustment of the mA/kV was utilized to reduce the radiation dose to as low  as reasonably achievable.  COMPARISON: None available.  CLINICAL HISTORY: Fall, loss of consciousness. Patient from home with EMS, vomiting started yesterday, today  was coffee ground like. Complains of generalized abdominal pain. History of alcohol  abuse.  FINDINGS: CT HEAD  BRAIN AND VENTRICLES: No acute intracranial hemorrhage. No mass effect or midline shift. No abnormal extra-axial fluid collection. Gray-white differentiation is maintained. No hydrocephalus.  ORBITS: No acute abnormality.  SINUSES AND MASTOIDS: No acute abnormality.  SOFT TISSUES AND SKULL: No acute skull fracture. No acute soft tissue abnormality.  CT CERVICAL SPINE  BONES AND ALIGNMENT: No acute fracture or traumatic malalignment.  DEGENERATIVE CHANGES: No significant degenerative changes.  SOFT TISSUES: No prevertebral soft tissue swelling.  IMPRESSION: 1. No acute intracranial abnormality. 2. No acute fracture or traumatic malalignment of the cervical spine.  Electronically signed by: Kevin Herman MD 07/15/2024 07:15 PM EDT RP Workstation: [id number]     Scheduled inpatient medications:   sodium chloride    Intravenous Once   ciprofloxacin   1 drop Both Eyes Q4H while awake   fluconazole   100 mg Oral Daily   levETIRAcetam   375 mg Oral BID   midodrine   5 mg Oral TID WC   nicotine   14 mg Transdermal Daily   nystatin   5 mL Oral QID   pantoprazole   40 mg Oral BID   polyethylene glycol  17 g Oral BID   [START ON 07/18/2024] propranolol   10 mg Oral BID   sodium chloride  flush  3 mL Intravenous Q12H   Continuous inpatient infusions:   albumin  human     sodium chloride      PRN inpatient medications: acetaminophen  **OR** acetaminophen , artificial tears, ondansetron  **OR** ondansetron  (ZOFRAN ) IV, mouth rinse, oxyCODONE -acetaminophen   Vital signs in last 24 hours: Temp:  [99.3 F (37.4 C)-100.3 F (37.9 C)] 100.3 F (37.9 C) (08/05 1333) Pulse Rate:  [88-126] 126 (08/05 1333) Resp:   [18-20] 20 (08/05 1410) BP: (85-133)/(58-86) 90/70 (08/05 1410) SpO2:  [98 %-100 %] 100 % (08/05 1333)    Intake/Output Summary (Last 24 hours) at 07/17/2024 1421 Last data filed at 07/17/2024 0900 Gross per 24 hour  Intake 746.35 ml  Output 1475 ml  Net -728.65 ml    Intake/Output from previous day: 08/04 0701 - 08/05 0700 In: 1981.5 [P.O.:600; I.V.:915.1; Blood:320; IV Piggyback:146.4] Out: 675 [Urine:675] Intake/Output this shift: Total I/O In: -  Out: 800 [Urine:800]   Physical Exam:  General: Alert male in NAD Heart:  Regular rate and rhythm.  Pulmonary: Normal respiratory effort Abdomen: Soft, nondistended, nontender. Normal bowel sounds. Extremities: No lower extremity edema  Neurologic: Alert and oriented Psych: Pleasant. Cooperative     LOS: 1 day   Vina Dasen ,NP 07/17/2024, 2:21 PM

## 2024-07-17 NOTE — Progress Notes (Addendum)
 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  1 unit platelet 8/5 endoscopy showed erosive gastritis with esophageal plaques consistent with candidiasis normal second portion duodenum no evidence of varices nonerosive bulbar duodenitis   Plan  Acute upper GI bleed transfused as above Octreotide  per GI  Scope as above  Candidiasis--he is relatively immunocompromised because of his severe EtOH use disorder PTA Treating for 20 days total with Diflucan  see below discussion  Stomatitis?  Ophthalmitis 2/2 candida versus other Patient has distinct photophobia asked me not to turn on lights for several days in a row now-fluconazole  was started by GI team yesterday for Candida Discussed case with Dr. Lindia who feels reasonable to involve ophthalmology-appreciated in advance of Dr. Leslee who states she will evaluate him little later today as it is not emergent May require fundoscopy Currently is on standard treatment with fluconazole -added nystatin  swish swallow in case his stomatitis is fungal  Severe  hypokalemia hypomagnesemia and alcoholic ketosisfrom EtOH Replaced aggressive magnesium  repleted additionally Mild hyponatremia hypokalemia but should self-correct-no further workup at this time repeat labs a.m.  EtOH liver disease-albumin  2.4 bilirubin 2.9 indicative of more chronic process-baseline AST ALT usually is higher Do not think this is acute hepatitis-imaging more concerning for steatosis Calculated 8/4 MELD score 18 = 3 to 4% 90-day mortality, child Pugh = C = life expectancy 1 to 3 years??  Need transplantation discussion versus goals of care-GI to pursue goals of care discussion-- Not Actively withdrawing discontinue protocol Switching Protonix  from IV 40 twice daily to 40 p.o. twice daily  Cocaine habituation Needs to quit unclear his readiness for this Give nicotine  patch His tachycardia might be mediated from his Substance abuse--adding clonidine  0.1 bid, metoprolol  12.5 bid  Status post left BKA LL AVN hip Cautious Percocet 1 tab every 8 as needed  Previous EtOH seizure-continues Keppra  375 twice daily  Called to discuss with sister Azavion Bouillon who is his legal guardian at above number--no response  Data Reviewed:   Sodium 133 potassium 3.4 BUN/creatinine 5/0.8 AST/ALT 64/24 total bili 2.7 WBC 9.2 hemoglobin 9.3 platelets 62 HIV nonreactive  DVT prophylaxis: Contraindicated SCD  Status is: Inpatient Remains inpatient appropriate because:   Needs workup inclusive of EGD     Subjective:  Continues to have issues with his eyes/mouth and does not allow me to open the blinds Was able to eat breakfast today---mouth overall better after nystatin  swish swallow No further bleeding or reports of the same Has been up sitting at the bedside   Objective + exam Vitals:   07/16/24 1200 07/16/24  1228 07/16/24 2015 07/16/24 2330  BP: 118/89 114/77 133/86 113/71  Pulse:   92 88  Resp: 20 20 20 18   Temp:  99 F (37.2 C) 99.3 F (37.4 C) 99.3 F (37.4 C)   TempSrc:  Oral Oral Oral  SpO2:   98% 98%  Weight:      Height:       Filed Weights   07/15/24 1459 07/16/24 0801  Weight: 83.6 kg 80.2 kg     Examination: OU--EOMI OD-EO<MI Acutiy is good with eye convered on one side--a little red conjunctive in L side No LAN Cta b Abd soft L bka noted    Scheduled Meds:  sodium chloride    Intravenous Once   ciprofloxacin   1 drop Both Eyes Q4H while awake   fluconazole   100 mg Oral Daily   folic acid   1 mg Oral Daily   levETIRAcetam   375 mg Oral BID   multivitamin with minerals  1 tablet Oral Daily   nicotine   14 mg Transdermal Daily   nystatin   5 mL Oral QID   pantoprazole  (PROTONIX ) IV  40 mg Intravenous Q12H   polyethylene glycol  17 g Oral BID   sodium chloride  flush  3 mL Intravenous Q12H   thiamine   100 mg Oral Daily   Or   thiamine   100 mg Intravenous Daily   Continuous Infusions:  Time 50 minutes  Jai-Gurmukh Markham Dumlao, MD  [facility]

## 2024-07-17 NOTE — Progress Notes (Addendum)
 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  1 unit platelet  Plan  Acute upper GI bleed transfused as above GI aware of patient-LR 100 cc/H octreotide  20 5ML/H, Protonix  IV 40 twice daily   severe hypokalemia hypomagnesemia and alcoholic ketosisfrom EtOH Replaced aggressive magnesium  repleted additionally Trend labs a.m.  EtOH liver disease-albumin  2.4 bilirubin 2.9 indicative of more chronic process-baseline AST ALT usually is higher Do not think this is acute hepatitis-imaging more concerning for steatosis MELD score 18 = 3 to 4% 90-day mortality, child Pugh = C = life expectancy 1 to 3 years??  Need transplantation discussion versus goals of care Defer to GI--have asked them to comment Watch for withdrawals-EtOH level on admission less than 15 raising concern for imminent withdrawal-protocol in place-currently is CIWA 0 currently  Cocaine habituation Needs to quit unclear his readiness for this Give nicotine  patch  Conjunctivitis?-Eyedrops  Status post left BKA LL AVN  hip Cautious Percocet 1 tab every 8 as needed    Data Reviewed:   Sodium 133 Potassium 2.6-->4.5 CO2 17-->19 BUN/creatinine 8/0.9 Anion gap 13 magnesium  1.2-->2.0 Bilirubin 2.9  DVT prophylaxis: Contraindicated SCD  Status is: Inpatient Remains inpatient appropriate because:   Needs workup inclusive of EGD     Subjective: Does not know which GI he has seen in the past has never visited-lives with his home boy at home-Sister is in charge of him Seems to be a little evasive about cocaine positive UDS Last drink was 3 days ago No current chest pain or abdominal pain No vomiting since yesterday-asking for nicotine  patch    Objective + exam Vitals:   07/16/24 1228 07/16/24 2015 07/16/24 2330 07/17/24 1110  BP: 114/77 133/86 113/71 108/85  Pulse:  92 88 (!) 111  Resp: 20 20 18 20   Temp: 99 F (37.2 C) 99.3 F (37.4 C) 99.3 F (37.4 C) 99.8 F (37.7 C)  TempSrc: Oral Oral Oral Oral  SpO2:  98% 98% 100%  Weight:      Height:       Filed Weights   07/15/24 1459 07/16/24 0801  Weight: 83.6 kg 80.2 kg     Examination: Disheveled black male looks older than stated age EOMI NCAT S1-S2 no murmur slight JVD Chest clear and anterolateral fields Abdomen slightly tender epigastrium?  HSM noted Left BKA noted slightly dusky on stump No lower extremity edema     Scheduled Meds:  sodium chloride    Intravenous Once   ciprofloxacin   1 drop Both Eyes Q4H while awake   cloNIDine   0.1 mg Oral BID  fluconazole   100 mg Oral Daily   levETIRAcetam   375 mg Oral BID   metoprolol  tartrate  12.5 mg Oral BID   nicotine   14 mg Transdermal Daily   nystatin   5 mL Oral QID   pantoprazole   40 mg Oral BID   polyethylene glycol  17 g Oral BID   sodium chloride  flush  3 mL Intravenous Q12H   Continuous Infusions:    Time 50 minutes  Jai-Gurmukh Neely Kammerer, MD  [facility]

## 2024-07-18 ENCOUNTER — Other Ambulatory Visit (HOSPITAL_COMMUNITY): Payer: Self-pay

## 2024-07-18 ENCOUNTER — Telehealth (HOSPITAL_COMMUNITY): Payer: Self-pay | Admitting: Pharmacy Technician

## 2024-07-18 ENCOUNTER — Ambulatory Visit: Payer: Medicaid Other | Admitting: Neurology

## 2024-07-18 DIAGNOSIS — B3781 Candidal esophagitis: Secondary | ICD-10-CM

## 2024-07-18 DIAGNOSIS — K922 Gastrointestinal hemorrhage, unspecified: Secondary | ICD-10-CM | POA: Diagnosis not present

## 2024-07-18 DIAGNOSIS — D62 Acute posthemorrhagic anemia: Secondary | ICD-10-CM

## 2024-07-18 DIAGNOSIS — E876 Hypokalemia: Secondary | ICD-10-CM

## 2024-07-18 DIAGNOSIS — D649 Anemia, unspecified: Secondary | ICD-10-CM | POA: Diagnosis not present

## 2024-07-18 DIAGNOSIS — D696 Thrombocytopenia, unspecified: Secondary | ICD-10-CM

## 2024-07-18 DIAGNOSIS — A5431 Gonococcal conjunctivitis: Secondary | ICD-10-CM

## 2024-07-18 DIAGNOSIS — Z515 Encounter for palliative care: Secondary | ICD-10-CM | POA: Diagnosis not present

## 2024-07-18 DIAGNOSIS — K920 Hematemesis: Secondary | ICD-10-CM | POA: Diagnosis not present

## 2024-07-18 DIAGNOSIS — Z7189 Other specified counseling: Secondary | ICD-10-CM

## 2024-07-18 DIAGNOSIS — F1910 Other psychoactive substance abuse, uncomplicated: Secondary | ICD-10-CM

## 2024-07-18 DIAGNOSIS — F1011 Alcohol abuse, in remission: Secondary | ICD-10-CM

## 2024-07-18 DIAGNOSIS — Z2981 Encounter for HIV pre-exposure prophylaxis: Secondary | ICD-10-CM

## 2024-07-18 DIAGNOSIS — G40909 Epilepsy, unspecified, not intractable, without status epilepticus: Secondary | ICD-10-CM

## 2024-07-18 LAB — CBC WITH DIFFERENTIAL/PLATELET
Abs Immature Granulocytes: 0.04 K/uL (ref 0.00–0.07)
Basophils Absolute: 0 K/uL (ref 0.0–0.1)
Basophils Relative: 1 %
Eosinophils Absolute: 0.2 K/uL (ref 0.0–0.5)
Eosinophils Relative: 2 %
HCT: 25.9 % — ABNORMAL LOW (ref 39.0–52.0)
Hemoglobin: 8.8 g/dL — ABNORMAL LOW (ref 13.0–17.0)
Immature Granulocytes: 1 %
Lymphocytes Relative: 40 %
Lymphs Abs: 3.6 K/uL (ref 0.7–4.0)
MCH: 33.6 pg (ref 26.0–34.0)
MCHC: 34 g/dL (ref 30.0–36.0)
MCV: 98.9 fL (ref 80.0–100.0)
Monocytes Absolute: 1.4 K/uL — ABNORMAL HIGH (ref 0.1–1.0)
Monocytes Relative: 16 %
Neutro Abs: 3.5 K/uL (ref 1.7–7.7)
Neutrophils Relative %: 40 %
Platelets: 69 K/uL — ABNORMAL LOW (ref 150–400)
RBC: 2.62 MIL/uL — ABNORMAL LOW (ref 4.22–5.81)
RDW: 23.3 % — ABNORMAL HIGH (ref 11.5–15.5)
Smear Review: NORMAL
WBC: 8.7 K/uL (ref 4.0–10.5)
nRBC: 0.3 % — ABNORMAL HIGH (ref 0.0–0.2)

## 2024-07-18 LAB — VITAMIN B12: Vitamin B-12: 637 pg/mL (ref 180–914)

## 2024-07-18 LAB — COMPREHENSIVE METABOLIC PANEL WITH GFR
ALT: 22 U/L (ref 0–44)
AST: 55 U/L — ABNORMAL HIGH (ref 15–41)
Albumin: 3 g/dL — ABNORMAL LOW (ref 3.5–5.0)
Alkaline Phosphatase: 90 U/L (ref 38–126)
Anion gap: 9 (ref 5–15)
BUN: <5 mg/dL — ABNORMAL LOW (ref 6–20)
CO2: 26 mmol/L (ref 22–32)
Calcium: 9 mg/dL (ref 8.9–10.3)
Chloride: 100 mmol/L (ref 98–111)
Creatinine, Ser: 0.75 mg/dL (ref 0.61–1.24)
GFR, Estimated: >60 mL/min (ref >60)
Glucose, Bld: 112 mg/dL — ABNORMAL HIGH (ref 70–99)
Potassium: 3.1 mmol/L — ABNORMAL LOW (ref 3.5–5.1)
Sodium: 135 mmol/L (ref 135–145)
Total Bilirubin: 2.3 mg/dL — ABNORMAL HIGH (ref 0.0–1.2)
Total Protein: 6.7 g/dL (ref 6.5–8.1)

## 2024-07-18 LAB — IRON AND TIBC
Iron: 30 ug/dL — ABNORMAL LOW (ref 45–182)
Saturation Ratios: 11 % — ABNORMAL LOW (ref 17.9–39.5)
TIBC: 284 ug/dL (ref 250–450)
UIBC: 254 ug/dL

## 2024-07-18 LAB — PROTIME-INR
INR: 1.2 (ref 0.8–1.2)
Prothrombin Time: 16 s — ABNORMAL HIGH (ref 11.4–15.2)

## 2024-07-18 LAB — GC/CHLAMYDIA PROBE AMP (~~LOC~~) NOT AT ARMC
Chlamydia: NEGATIVE
Chlamydia: NEGATIVE
Comment: NEGATIVE
Comment: NORMAL
Comment: NEGATIVE
Comment: NORMAL
Neisseria Gonorrhea: NEGATIVE
Neisseria Gonorrhea: NEGATIVE

## 2024-07-18 LAB — CYTOLOGY, (ORAL, ANAL, URETHRAL) ANCILLARY ONLY
Chlamydia: NEGATIVE
Chlamydia: NEGATIVE
Chlamydia: NEGATIVE
Comment: NEGATIVE
Comment: NEGATIVE
Comment: NORMAL
Comment: NORMAL
Comment: NEGATIVE
Comment: NORMAL
Neisseria Gonorrhea: NEGATIVE
Neisseria Gonorrhea: NEGATIVE
Neisseria Gonorrhea: NEGATIVE

## 2024-07-18 LAB — FOLATE: Folate: 12.9 ng/mL (ref >5.9)

## 2024-07-18 LAB — RPR: RPR Ser Ql: NONREACTIVE

## 2024-07-18 LAB — MAGNESIUM: Magnesium: 1 mg/dL — ABNORMAL LOW (ref 1.7–2.4)

## 2024-07-18 LAB — FERRITIN: Ferritin: 60 ng/mL (ref 24–336)

## 2024-07-18 MED ORDER — POTASSIUM CHLORIDE 20 MEQ PO PACK
40.0000 meq | PACK | Freq: Once | ORAL | Status: AC
  Administered 2024-07-18: 40 meq via ORAL
  Filled 2024-07-18: qty 2

## 2024-07-18 MED ORDER — DESCOVY 200-25 MG PO TABS
1.0000 | ORAL_TABLET | Freq: Every day | ORAL | 0 refills | Status: DC
  Filled 2024-07-18: qty 30, 30d supply, fill #0

## 2024-07-18 MED ORDER — EMTRICITABINE-TENOFOVIR AF 200-25 MG PO TABS
1.0000 | ORAL_TABLET | Freq: Every day | ORAL | Status: DC
  Administered 2024-07-18 – 2024-07-19 (×2): 1 via ORAL
  Filled 2024-07-18 (×2): qty 1

## 2024-07-18 MED ORDER — FLUCONAZOLE 200 MG PO TABS
200.0000 mg | ORAL_TABLET | Freq: Every day | ORAL | Status: DC
  Administered 2024-07-19: 200 mg via ORAL
  Filled 2024-07-18: qty 1
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
## 2024-07-18 NOTE — Progress Notes (Signed)
 Patient asks for for pain medication, he had Percocet on his profile for every 8h. The last time he received it according to [facility] was at 1759 today which I explain to him. Patient said someone is not telling the true, and I want to see the Doctor now. Dr. Franky is notified

## 2024-07-18 NOTE — Progress Notes (Signed)
 [facility]                                                                               [name], is a 43 y.o. male, DOB - October 16, 1981, FMW:[id number] Admit date - 07/15/2024    Outpatient Primary MD for the patient is Anice Wilbert BIRCH, NP  LOS - 2  days    Brief summary   43 year old gentleman with prior history of heart failure with reduced EF of 40 to 50%, seizure disorder, alcohol  use disorder s/p left BKA presents with generalized abdominal pain associated with nausea and vomiting and coffee-ground emesis.  CT of the abdomen and pelvis done showed cholelithiasis, hepatic steatosis and 4 cm ascending thoracic aortic aneurysm. GI consulted patient underwent EGD showing esophageal candidiasis  Assessment & Plan    Assessment and Plan:   Acute on Anemia of chronic disease in the setting of iron deficiency anemia Transfuse to keep hemoglobin greater than 7.   Coffee ground emesis/ Erosive gastritis: Gastric biopsies shows active chronic gastritis and intestinal metaplasia.  H. pylori stain is pending   Esophageal candidiasis patient currently on Diflucan , continue the same to complete the course.    Illicit drug use with cocaine Counseling given to encourage cessation    Alcohol  related cirrhosis Unfortunately patient continues to take alcohol . No signs of withdrawals at this time GI on board and appreciate recommendations.    Severe purulent conjunctivitis with corneal involvement Rule out gonorrhea, culture media requested from the lab. Empirically treat with ceftriaxone  and ciprofloxacin  eyedrops every 2 hours as recommended by ophthalmology   History of seizure disorder Stable   History of BKA Therapy evaluations ordered   Thrombocytopenia From liver disease.    Hypokalemia Replaced. Repeat in am.    Estimated body mass index is 26.88 kg/m as calculated from the following:   Height as of this encounter: 5' 8 (1.727 m).    Weight as of this encounter: 80.2 kg.  Code Status: Full code DVT Prophylaxis:  SCDs Start: 07/16/24 0027   Level of Care: Level of care: Progressive Family Communication: None at bedside  Disposition Plan:     Remains inpatient appropriate: Pending stabilization of hemoglobin  Procedures:  EGD.   Consultants:   gastroenterology  ophthalmology  Antimicrobials:   Anti-infectives (From admission, onward)    Start     Dose/Rate Route Frequency Ordered Stop   07/18/24 2000  cefTRIAXone  (ROCEPHIN ) 2 g in sodium chloride  0.9 % 100 mL IVPB  Status:  Discontinued        2 g 200 mL/hr over 30 Minutes Intravenous Every 24 hours 07/17/24 2153 07/18/24 0941   07/17/24 1945  cefTRIAXone  (ROCEPHIN ) 2 g in sodium chloride  0.9 % 100 mL IVPB        2 g 200 mL/hr over 30 Minutes Intravenous  Once 07/17/24 1852 07/17/24 2117   07/17/24 1945  doxycycline  (VIBRA -TABS) tablet 100 mg        100 mg Oral 2 times daily 07/17/24 1852 07/24/24 1959   07/17/24 1000  fluconazole  (DIFLUCAN ) tablet 100 mg        100 mg Oral Daily 07/16/24 1145 08/06/24 0959  07/16/24 1400  fluconazole  (DIFLUCAN ) IVPB 400 mg        400 mg 100 mL/hr over 120 Minutes Intravenous  Once 07/16/24 1145 07/16/24 1839        Medications  Scheduled Meds:  sodium chloride    Intravenous Once   ciprofloxacin   1 drop Both Eyes Q2H   doxycycline   100 mg Oral BID   fluconazole   100 mg Oral Daily   levETIRAcetam   375 mg Oral BID   midodrine   5 mg Oral TID WC   nicotine   14 mg Transdermal Daily   nystatin   5 mL Oral QID   pantoprazole   40 mg Oral BID   polyethylene glycol  17 g Oral BID   sodium chloride  flush  3 mL Intravenous Q12H   Continuous Infusions:  sodium chloride      PRN Meds:.acetaminophen  **OR** acetaminophen , ondansetron  **OR** ondansetron  (ZOFRAN ) IV, mouth rinse, oxyCODONE -acetaminophen     Subjective:   [name] was seen and examined today.  No new complaints.  Abd pain has resolved. No nausea  or vomiting.   Objective:   Vitals:   07/18/24 0005 07/18/24 0409 07/18/24 0735 07/18/24 1123  BP:  108/77 97/66   Pulse: 78 78 89   Resp:  18 19   Temp: 98.6 F (37 C) 98 F (36.7 C) 98.8 F (37.1 C) 98.9 F (37.2 C)  TempSrc: Oral Oral Oral   SpO2: 100% 100% 100%   Weight:      Height:        Intake/Output Summary (Last 24 hours) at 07/18/2024 1126 Last data filed at 07/18/2024 0710 Gross per 24 hour  Intake --  Output 2150 ml  Net -2150 ml   Filed Weights   07/15/24 1459 07/16/24 0801  Weight: 83.6 kg 80.2 kg     Exam General exam: Appears calm and comfortable  Respiratory system: Clear to auscultation. Respiratory effort normal. Cardiovascular system: S1 & S2 heard, RRR. No JVD Gastrointestinal system: Abdomen is nondistended, soft and nontender.  Central nervous system: Alert and oriented. Extremities: s/p BKA Skin: No rashes, Psychiatry:  Mood & affect appropriate.     Data Reviewed:  I have personally reviewed following labs and imaging studies   CBC Lab Results  Component Value Date   WBC 8.7 07/18/2024   RBC 2.62 (L) 07/18/2024   HGB 8.8 (L) 07/18/2024   HCT 25.9 (L) 07/18/2024   MCV 98.9 07/18/2024   MCH 33.6 07/18/2024   PLT 69 (L) 07/18/2024   MCHC 34.0 07/18/2024   RDW 23.3 (H) 07/18/2024   LYMPHSABS 3.6 07/18/2024   MONOABS 1.4 (H) 07/18/2024   EOSABS 0.2 07/18/2024   BASOSABS 0.0 07/18/2024     Last metabolic panel Lab Results  Component Value Date   NA 135 07/18/2024   K 3.1 (L) 07/18/2024   CL 100 07/18/2024   CO2 26 07/18/2024   BUN <5 (L) 07/18/2024   CREATININE 0.75 07/18/2024   GLUCOSE 112 (H) 07/18/2024   GFRNONAA >60 07/18/2024   GFRAA >60 06/07/2020   CALCIUM  9.0 07/18/2024   PHOS 2.4 (L) 07/12/2021   PROT 6.7 07/18/2024   ALBUMIN  3.0 (L) 07/18/2024   BILITOT 2.3 (H) 07/18/2024   ALKPHOS 90 07/18/2024   AST 55 (H) 07/18/2024   ALT 22 07/18/2024   ANIONGAP 9 07/18/2024    CBG (last 3)  Recent Labs     07/15/24 1502  GLUCAP 83      Coagulation Profile: Recent Labs  Lab 07/15/24 1625 07/16/24  0501 07/18/24 0311  INR 1.4* 1.3* 1.2     Radiology Studies: No results found.     Elgie Butter M.D. [facility] 07/18/2024, 11:26 AM  Available via Epic secure chat 7am-7pm After 7 pm, please refer to night coverage provider listed on amion.
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
## 2024-07-18 NOTE — Telephone Encounter (Signed)
 Patient Product/process development scientist completed.    The patient is insured through [facility] [place].     Ran test claim for Descovy  and the current 30 day co-pay is $0.00.  Ran test claim for emtricitabine -tenofovir  (Truvada) 200-300 mg and the current 30 day co-pay is $0.00.  This test claim was processed through [facility]- copay amounts may vary at other pharmacies due to pharmacy/plan contracts, or as the patient moves through the different stages of their insurance plan.     Reyes Sharps, CPHT Pharmacy Technician III Certified Patient Advocate [facility] Patient Advocate Team Direct Number: [phone]  Fax: [phone]

## 2024-07-18 NOTE — Evaluation (Signed)
 Physical Therapy Evaluation Patient Details Name: [name] MRN: [id number] DOB: 01/23/1981 Today's Date: 07/18/2024  History of Present Illness  43 y.o. male presents to [facility] hospital on 07/15/2024 with abdominal pain and coffee ground emesis. Upper endoscopy on 8/4. PMHx: alcohol  abuse, L BKA, HTN, stoke, seizure disorder.  Clinical Impression  Pt presents to PT with deficits in endurance, cardiopulmonary function, gait, and balance. Pt reports being typically independent without use of DME, however he declines attempts at ambulation without DME. Pt is tachycardic up to 170 when ambulating, denies symptoms. PT will keep the patient on caseload to attempt gait training without DME and to initiate stair training. HHPT and a RW currently recommended.        If plan is discharge home, recommend the following: A little help with bathing/dressing/bathroom;Assistance with cooking/housework;Assist for transportation;Help with stairs or ramp for entrance   Can travel by private vehicle        Equipment Recommendations Rolling walker (2 wheels)  Recommendations for Other Services       Functional Status Assessment Patient has had a recent decline in their functional status and demonstrates the ability to make significant improvements in function in a reasonable and predictable amount of time.     Precautions / Restrictions Precautions Precautions: Fall Recall of Precautions/Restrictions: Intact Restrictions Weight Bearing Restrictions Per Provider Order: No      Mobility  Bed Mobility Overal bed mobility: Independent                  Transfers Overall transfer level: Modified independent Equipment used: Rolling walker (2 wheels)                    Ambulation/Gait Ambulation/Gait assistance: Supervision Gait Distance (Feet): 200 Feet Assistive device: Rolling walker (2 wheels) Gait Pattern/deviations: Step-through pattern Gait velocity: functional Gait  velocity interpretation: 1.31 - 2.62 ft/sec, indicative of limited community ambulator   General Gait Details: slowed step-through gait, pt declines attempting to ambulate without RW  Stairs            Wheelchair Mobility     Tilt Bed    Modified Rankin (Stroke Patients Only)       Balance Overall balance assessment: Needs assistance Sitting-balance support: No upper extremity supported, Feet supported Sitting balance-Leahy Scale: Good     Standing balance support: Single extremity supported, Reliant on assistive device for balance Standing balance-Leahy Scale: Poor                               Pertinent Vitals/Pain Pain Assessment Pain Assessment: No/denies pain    Home Living Family/patient expects to be discharged to:: Private residence Living Arrangements: Non-relatives/Friends (roommate) Available Help at Discharge: Friend(s);Available PRN/intermittently Type of Home: House Home Access: Stairs to enter Entrance Stairs-Rails: Doctor, general practice of Steps: 2 flights   Home Layout: Multi-level (unsure if the patient lives in a multi-level home or if he is in an apartment/condo that is one story but on the 3rd floor of his building) Home Equipment: Cane - single point;Crutches      Prior Function Prior Level of Function : Independent/Modified Independent             Mobility Comments: pt reports typically not utilizing any DME       Extremity/Trunk Assessment   Upper Extremity Assessment Upper Extremity Assessment: Overall WFL for tasks assessed    Lower Extremity Assessment Lower Extremity Assessment: Overall  WFL for tasks assessed (L BKA)    Cervical / Trunk Assessment Cervical / Trunk Assessment: Normal  Communication   Communication Communication: No apparent difficulties    Cognition Arousal: Alert Behavior During Therapy: Agitated, WFL for tasks assessed/performed (mild agitation initially but the patient  does calm down by the end of session)   PT - Cognitive impairments: No family/caregiver present to determine baseline (anticipate the pt is at baseline)                         Following commands: Intact       Cueing Cueing Techniques: Verbal cues     General Comments General comments (skin integrity, edema, etc.): pt intermittently tachycardic up to 130s at rest prior to PT session, HR elevates to 170 during ambulation, recovers quickly to 120-130s once seated    Exercises     Assessment/Plan    PT Assessment Patient needs continued PT services  PT Problem List Decreased activity tolerance;Decreased balance;Decreased mobility;Cardiopulmonary status limiting activity       PT Treatment Interventions DME instruction;Gait training;Stair training;Functional mobility training;Therapeutic activities;Therapeutic exercise;Balance training;Neuromuscular re-education;Patient/family education    PT Goals (Current goals can be found in the Care Plan section)  Acute Rehab PT Goals Patient Stated Goal: to return home PT Goal Formulation: With patient Time For Goal Achievement: 08/01/24 Potential to Achieve Goals: Good Additional Goals Additional Goal #1: Pt will score >19/24 on the DGI to indicate a reduced risk for falls    Frequency Min 2X/week     Co-evaluation               AM-PAC PT 6 Clicks Mobility  Outcome Measure Help needed turning from your back to your side while in a flat bed without using bedrails?: None Help needed moving from lying on your back to sitting on the side of a flat bed without using bedrails?: None Help needed moving to and from a bed to a chair (including a wheelchair)?: None Help needed standing up from a chair using your arms (e.g., wheelchair or bedside chair)?: None Help needed to walk in hospital room?: None Help needed climbing 3-5 steps with a railing? : A Little 6 Click Score: 23    End of Session   Activity Tolerance:  Patient tolerated treatment well Patient left: in bed;with call bell/phone within reach Nurse Communication: Mobility status PT Visit Diagnosis: Other abnormalities of gait and mobility (R26.89)    Time: 9168-9150 PT Time Calculation (min) (ACUTE ONLY): 18 min   Charges:   PT Evaluation $PT Eval Low Complexity: 1 Low   PT General Charges $$ ACUTE PT VISIT: 1 Visit         Bernardino JINNY Ruth, PT, DPT Acute Rehabilitation Office [phone]   Bernardino JINNY Ruth 07/18/2024, 11:36 AM

## 2024-07-18 NOTE — Progress Notes (Signed)
 Daily Progress Note  DOA: 07/15/2024 Hospital Day: 4   Patient Profile:   43 year old male with a history of EtOH abuse complicated by withdrawal seizures, hepatic steatosis , diverticulosis , cholelithiasis , chronic pain , chronic anemia and thrombocytopenia.  See below and also PMH for additional medical history. Patient admitted with abdominal pain and coffee-ground emesis.      ASSESSMENT    Coffee ground emesis Erosive gastritis Gastric biopsies c/w mildly active chronic gastritis and intestinal metaplasia. H.Pylori stain pending TODAY>> No overt GI bleeding. Hasn't had any N/V today. No BM yet today.   Acute on chronic anemia.  History of iron deficiency in 2022 MCV was elevated once this admission but back to normal.Worsening anemia likely combination of GI bleed, phlebotomy, nutritional deficiencies, some component of hemodilution, and suspected liver disease. TODAY>> Hgb down a some overnight  to 8.8 but did get 500 ml IV fluid bolus yesterday so drop may be dilutional.   Acute hypotension / tachycardia Unclear why he suddenly became tachycardic yesterday ( ? Anxiety).  Hypotension probably related to the Catapress ( now stopped). Got 500 cc IV fluid bolus yesterday.  TODAY>>> BP better but still a bit soft at 97/66. HR normal.     Mildly elevated LFTs / Etoh use Mild coagulopathy Chronic thrombocytopenia Non-cirrhotic appearing liver on CT scan but US  Aug 2024 suggested cirrhosis. If so then may be etoh related. Viral Hep B, C negative.  TODAY>> LFTs slowly improving.    Esophageal candidiasis Getting Diflucan    Illicit drug use UDS positive for cocaine   Principal Problem:   Acute upper GI bleeding Active Problems:   Hypokalemia   Thrombocytopenia (HCC)   Seizure disorder (HCC)   Acute blood loss anemia (ABLA)   History of alcohol  abuse   Substance abuse (HCC)   Gastritis and gastroduodenitis   Candida esophagitis (HCC)   PLAN   --Awaiting  H.pylori stain result --updating iron studies, B12, folate --Continue BID Pantoprazole  while inpatient and then once daily 30 minutes prior to breakfast upon discharge --Advised him to discontinue Etoh due to suspected underlying liver disease.  --He will need to contact our office to make a hospital follow up appointment.   Subjective   Feels okay today. No N/V. Hasn't had a BM today.   Objective   GI Studies:    Recent Labs    07/16/24 0501 07/17/24 0351 07/18/24 0311  WBC 8.3 9.2 8.7  HGB 9.7* 9.3* 8.8*  HCT 28.7* 27.2* 25.9*  MCV 101.1* 97.1 98.9  PLT 59* 62* 69*   No results for input(s): FOLATE, VITAMINB12, FERRITIN, TIBC, IRONPCTSAT in the last 72 hours. Recent Labs    07/16/24 0501 07/17/24 0351 07/18/24 0311  NA 133* 132* 135  K 4.5 3.4* 3.1*  CL 101 98 100  CO2 19* 25 26  GLUCOSE 93 137* 112*  BUN 8 5* <5*  CREATININE 0.97 0.80 0.75  CALCIUM  8.8* 8.7* 9.0   Recent Labs    07/16/24 0501 07/17/24 0351 07/18/24 0311  PROT 6.9 6.6 6.7  ALBUMIN  2.4* 2.4* 3.0*  AST 86* 64* 55*  ALT 24 24 22   ALKPHOS 71 73 90  BILITOT 2.9* 2.7* 2.3*      Imaging:  CT CHEST ABDOMEN PELVIS W CONTRAST CLINICAL DATA:  Clemens, left lower rib pain, hematemesis  EXAM: CT CHEST, ABDOMEN, AND PELVIS WITH CONTRAST  TECHNIQUE: Multidetector CT imaging of the chest, abdomen and pelvis was performed following the standard protocol during  bolus administration of intravenous contrast.  RADIATION DOSE REDUCTION: This exam was performed according to the departmental dose-optimization program which includes automated exposure control, adjustment of the mA and/or kV according to patient size and/or use of iterative reconstruction technique.  CONTRAST:  75mL OMNIPAQUE  IOHEXOL  350 MG/ML SOLN  COMPARISON:  09/21/2023, 04/27/2012  FINDINGS: CT CHEST FINDINGS  Cardiovascular: The heart is unremarkable without pericardial effusion. 4 cm ascending thoracic aortic  aneurysm with no evidence of dissection. Atherosclerosis of the aorta and coronary vasculature.  Mediastinum/Nodes: No enlarged mediastinal, hilar, or axillary lymph nodes. Thyroid gland, trachea, and esophagus demonstrate no significant findings.  Lungs/Pleura: No acute airspace disease, effusion, or pneumothorax. Central airways are patent.  Musculoskeletal: Multiple prior healed left lower rib fractures. No acute or destructive bony abnormalities. Reconstructed images demonstrate no additional findings.  CT ABDOMEN PELVIS FINDINGS  Hepatobiliary: Hepatic steatosis. No focal liver abnormality or biliary duct dilation. Multiple small gallstones layer dependently within a distended gallbladder, with no gallbladder wall thickening or pericholecystic fat stranding to suggest cholecystitis.  Pancreas: Unremarkable. No pancreatic ductal dilatation or surrounding inflammatory changes.  Spleen: Normal in size without focal abnormality.  Adrenals/Urinary Tract: Multiple simple bilateral renal cortical cysts do not require specific imaging follow-up. No urinary tract calculi or obstructive uropathy. The adrenals and bladder are unremarkable.  Stomach/Bowel: No bowel obstruction or ileus. Normal retrocecal appendix. Scattered colonic diverticulosis without evidence of acute diverticulitis. No bowel wall thickening or inflammatory change.  Vascular/Lymphatic: Aortic atherosclerosis. No enlarged abdominal or pelvic lymph nodes.  Reproductive: Prostate is unremarkable.  Other: No free fluid or free intraperitoneal gas. No abdominal wall hernia.  Musculoskeletal: No acute or destructive bony abnormalities. Postsurgical changes from left hip ORIF. Avascular necrosis of the left femoral head without evidence of subchondral collapse.  IMPRESSION: 1. No acute intrathoracic, intra-abdominal, or intrapelvic trauma. 2. Cholelithiasis without evidence of acute cholecystitis. 3.  Scattered colonic diverticulosis without evidence of acute diverticulitis. 4. 4 cm ascending thoracic aortic aneurysm. Recommend annual imaging followup by CTA or MRA. This recommendation follows 2010 ACCF/AHA/AATS/ACR/ASA/SCA/SCAI/SIR/STS/SVM Guidelines for the Diagnosis and Management of Patients with Thoracic Aortic Disease. Circulation. 2010; 121: Z733-z630. Aortic aneurysm NOS (ICD10-I71.9) 5. Hepatic steatosis. 6. Aortic Atherosclerosis (ICD10-I70.0). Coronary artery atherosclerosis.  Electronically Signed   By: Ozell Daring M.D.   On: 07/15/2024 19:41 CT Head Wo Contrast EXAM: CT HEAD AND CERVICAL SPINE 07/15/2024 07:02:08 PM  TECHNIQUE: CT of the head and cervical spine was performed without the administration of intravenous contrast. Multiplanar reformatted images are provided for review. Automated exposure control, iterative reconstruction, and/or weight based adjustment of the mA/kV was utilized to reduce the radiation dose to as low as reasonably achievable.  COMPARISON: None available.  CLINICAL HISTORY: Fall, loss of consciousness. Patient from home with EMS, vomiting started yesterday, today was coffee ground like. Complains of generalized abdominal pain. History of alcohol  abuse.  FINDINGS: CT HEAD  BRAIN AND VENTRICLES: No acute intracranial hemorrhage. No mass effect or midline shift. No abnormal extra-axial fluid collection. Gray-white differentiation is maintained. No hydrocephalus.  ORBITS: No acute abnormality.  SINUSES AND MASTOIDS: No acute abnormality.  SOFT TISSUES AND SKULL: No acute skull fracture. No acute soft tissue abnormality.  CT CERVICAL SPINE  BONES AND ALIGNMENT: No acute fracture or traumatic malalignment.  DEGENERATIVE CHANGES: No significant degenerative changes.  SOFT TISSUES: No prevertebral soft tissue swelling.  IMPRESSION: 1. No acute intracranial abnormality. 2. No acute fracture or traumatic malalignment  of the cervical spine.  Electronically signed by: Franky  Alexa MD 07/15/2024 07:15 PM EDT RP Workstation: [id number] CT Cervical Spine Wo Contrast EXAM: CT HEAD AND CERVICAL SPINE 07/15/2024 07:02:08 PM  TECHNIQUE: CT of the head and cervical spine was performed without the administration of intravenous contrast. Multiplanar reformatted images are provided for review. Automated exposure control, iterative reconstruction, and/or weight based adjustment of the mA/kV was utilized to reduce the radiation dose to as low as reasonably achievable.  COMPARISON: None available.  CLINICAL HISTORY: Fall, loss of consciousness. Patient from home with EMS, vomiting started yesterday, today was coffee ground like. Complains of generalized abdominal pain. History of alcohol  abuse.  FINDINGS: CT HEAD  BRAIN AND VENTRICLES: No acute intracranial hemorrhage. No mass effect or midline shift. No abnormal extra-axial fluid collection. Gray-white differentiation is maintained. No hydrocephalus.  ORBITS: No acute abnormality.  SINUSES AND MASTOIDS: No acute abnormality.  SOFT TISSUES AND SKULL: No acute skull fracture. No acute soft tissue abnormality.  CT CERVICAL SPINE  BONES AND ALIGNMENT: No acute fracture or traumatic malalignment.  DEGENERATIVE CHANGES: No significant degenerative changes.  SOFT TISSUES: No prevertebral soft tissue swelling.  IMPRESSION: 1. No acute intracranial abnormality. 2. No acute fracture or traumatic malalignment of the cervical spine.  Electronically signed by: Kevin Herman MD 07/15/2024 07:15 PM EDT RP Workstation: [id number]     Scheduled inpatient medications:   sodium chloride    Intravenous Once   ciprofloxacin   1 drop Both Eyes Q2H   doxycycline   100 mg Oral BID   fluconazole   100 mg Oral Daily   levETIRAcetam   375 mg Oral BID   midodrine   5 mg Oral TID WC   nicotine   14 mg Transdermal Daily   nystatin   5 mL Oral QID   pantoprazole    40 mg Oral BID   polyethylene glycol  17 g Oral BID   sodium chloride  flush  3 mL Intravenous Q12H   Continuous inpatient infusions:   sodium chloride      PRN inpatient medications: acetaminophen  **OR** acetaminophen , ondansetron  **OR** ondansetron  (ZOFRAN ) IV, mouth rinse, oxyCODONE -acetaminophen   Vital signs in last 24 hours: Temp:  [98 F (36.7 C)-100.3 F (37.9 C)] 98.8 F (37.1 C) (08/06 0735) Pulse Rate:  [72-126] 89 (08/06 0735) Resp:  [16-22] 19 (08/06 0735) BP: (85-109)/(58-92) 97/66 (08/06 0735) SpO2:  [97 %-100 %] 100 % (08/06 0735)    Intake/Output Summary (Last 24 hours) at 07/18/2024 1101 Last data filed at 07/18/2024 0710 Gross per 24 hour  Intake --  Output 2150 ml  Net -2150 ml    Intake/Output from previous day: 08/05 0701 - 08/06 0700 In: -  Out: 2700 [Urine:2700] Intake/Output this shift: Total I/O In: -  Out: 250 [Urine:250]   Physical Exam:  General: Alert male in NAD Heart:  Regular rate and rhythm.  Pulmonary: Normal respiratory effort Abdomen: Soft, nondistended, nontender. Normal bowel sounds. Extremities: No lower extremity edema  Neurologic: Alert and oriented Psych: Pleasant. Cooperative     LOS: 2 days   Vina Dasen ,NP 07/18/2024, 11:01 AM

## 2024-07-19 ENCOUNTER — Other Ambulatory Visit (HOSPITAL_COMMUNITY): Payer: Self-pay

## 2024-07-19 DIAGNOSIS — G40909 Epilepsy, unspecified, not intractable, without status epilepticus: Secondary | ICD-10-CM | POA: Diagnosis not present

## 2024-07-19 DIAGNOSIS — K2990 Gastroduodenitis, unspecified, without bleeding: Secondary | ICD-10-CM

## 2024-07-19 DIAGNOSIS — D62 Acute posthemorrhagic anemia: Secondary | ICD-10-CM | POA: Diagnosis not present

## 2024-07-19 DIAGNOSIS — E876 Hypokalemia: Secondary | ICD-10-CM | POA: Diagnosis not present

## 2024-07-19 DIAGNOSIS — B3781 Candidal esophagitis: Secondary | ICD-10-CM | POA: Diagnosis not present

## 2024-07-19 DIAGNOSIS — K2970 Gastritis, unspecified, without bleeding: Secondary | ICD-10-CM

## 2024-07-19 LAB — CBC WITH DIFFERENTIAL/PLATELET
Abs Granulocyte: 3.4 K/uL (ref 1.5–6.5)
Abs Immature Granulocytes: 0.05 K/uL (ref 0.00–0.07)
Basophils Absolute: 0 K/uL (ref 0.0–0.1)
Basophils Relative: 1 %
Eosinophils Absolute: 0.2 K/uL (ref 0.0–0.5)
Eosinophils Relative: 2 %
HCT: 28.7 % — ABNORMAL LOW (ref 39.0–52.0)
Hemoglobin: 9.5 g/dL — ABNORMAL LOW (ref 13.0–17.0)
Immature Granulocytes: 1 %
Lymphocytes Relative: 36 %
Lymphs Abs: 3.1 K/uL (ref 0.7–4.0)
MCH: 33.5 pg (ref 26.0–34.0)
MCHC: 33.1 g/dL (ref 30.0–36.0)
MCV: 101.1 fL — ABNORMAL HIGH (ref 80.0–100.0)
Monocytes Absolute: 1.8 K/uL — ABNORMAL HIGH (ref 0.1–1.0)
Monocytes Relative: 21 %
Neutro Abs: 3.4 K/uL (ref 1.7–7.7)
Neutrophils Relative %: 39 %
Platelets: 84 K/uL — ABNORMAL LOW (ref 150–400)
RBC: 2.84 MIL/uL — ABNORMAL LOW (ref 4.22–5.81)
RDW: 23.7 % — ABNORMAL HIGH (ref 11.5–15.5)
Smear Review: NORMAL
WBC: 8.5 K/uL (ref 4.0–10.5)
nRBC: 0.2 % (ref 0.0–0.2)

## 2024-07-19 LAB — HIV-1 RNA QUANT-NO REFLEX-BLD
HIV 1 RNA Quant: <20 {copies}/mL
LOG10 HIV-1 RNA: UNDETERMINED {Log_copies}/mL

## 2024-07-19 LAB — COMPREHENSIVE METABOLIC PANEL WITH GFR
ALT: 30 U/L (ref 0–44)
AST: 80 U/L — ABNORMAL HIGH (ref 15–41)
Albumin: 2.9 g/dL — ABNORMAL LOW (ref 3.5–5.0)
Alkaline Phosphatase: 88 U/L (ref 38–126)
Anion gap: 8 (ref 5–15)
BUN: <5 mg/dL — ABNORMAL LOW (ref 6–20)
CO2: 26 mmol/L (ref 22–32)
Calcium: 9.3 mg/dL (ref 8.9–10.3)
Chloride: 100 mmol/L (ref 98–111)
Creatinine, Ser: 0.74 mg/dL (ref 0.61–1.24)
GFR, Estimated: >60 mL/min (ref >60)
Glucose, Bld: 116 mg/dL — ABNORMAL HIGH (ref 70–99)
Potassium: 3.8 mmol/L (ref 3.5–5.1)
Sodium: 134 mmol/L — ABNORMAL LOW (ref 135–145)
Total Bilirubin: 2.5 mg/dL — ABNORMAL HIGH (ref 0.0–1.2)
Total Protein: 7.1 g/dL (ref 6.5–8.1)

## 2024-07-19 LAB — SURGICAL PATHOLOGY

## 2024-07-19 LAB — MAGNESIUM: Magnesium: 1 mg/dL — ABNORMAL LOW (ref 1.7–2.4)

## 2024-07-19 MED ORDER — CIPROFLOXACIN HCL 0.3 % OP SOLN
1.0000 [drp] | OPHTHALMIC | 0 refills | Status: DC
  Filled 2024-07-19: qty 5, 7d supply, fill #0

## 2024-07-19 MED ORDER — NICOTINE 14 MG/24HR TD PT24: 14.0000 mg | MEDICATED_PATCH | Freq: Every day | TRANSDERMAL | 0 refills | Status: AC

## 2024-07-19 MED ORDER — MAGNESIUM OXIDE -MG SUPPLEMENT 400 (240 MG) MG PO TABS: 400.0000 mg | ORAL_TABLET | Freq: Every day | ORAL | Status: DC

## 2024-07-19 MED ORDER — NYSTATIN 100000 UNIT/ML MT SUSP: 5.0000 mL | Freq: Four times a day (QID) | OROMUCOSAL | 0 refills | Status: DC

## 2024-07-19 MED ORDER — NYSTATIN 100000 UNIT/ML MT SUSP
5.0000 mL | Freq: Four times a day (QID) | OROMUCOSAL | 0 refills | Status: DC
  Filled 2024-07-19: qty 60, 3d supply, fill #0

## 2024-07-19 MED ORDER — MAGNESIUM OXIDE -MG SUPPLEMENT 400 (240 MG) MG PO TABS: 400.0000 mg | ORAL_TABLET | Freq: Every day | ORAL | 2 refills | Status: AC

## 2024-07-19 MED ORDER — MIDODRINE HCL 5 MG PO TABS: 5.0000 mg | ORAL_TABLET | Freq: Three times a day (TID) | ORAL | 1 refills | Status: AC

## 2024-07-19 MED ORDER — DOXYCYCLINE HYCLATE 100 MG PO TABS: 100.0000 mg | ORAL_TABLET | Freq: Two times a day (BID) | ORAL | 0 refills | Status: AC

## 2024-07-19 MED ORDER — DESCOVY 200-25 MG PO TABS
1.0000 | ORAL_TABLET | Freq: Every day | ORAL | 0 refills | Status: AC
Start: 2024-07-19 — End: ?

## 2024-07-19 MED ORDER — FERROUS SULFATE 325 (65 FE) MG PO TABS
325.0000 mg | ORAL_TABLET | Freq: Every day | ORAL | 3 refills | Status: DC
  Filled 2024-07-19: qty 30, 30d supply, fill #0

## 2024-07-19 MED ORDER — DOXYCYCLINE HYCLATE 100 MG PO TABS
100.0000 mg | ORAL_TABLET | Freq: Two times a day (BID) | ORAL | 0 refills | Status: DC
  Filled 2024-07-19: qty 10, 5d supply, fill #0

## 2024-07-19 MED ORDER — POLYETHYLENE GLYCOL 3350 17 GM/SCOOP PO POWD: 17.0000 g | Freq: Two times a day (BID) | ORAL | 0 refills | Status: AC

## 2024-07-19 MED ORDER — POLYETHYLENE GLYCOL 3350 17 GM/SCOOP PO POWD
17.0000 g | Freq: Two times a day (BID) | ORAL | 0 refills | Status: DC
  Filled 2024-07-19: qty 238, 7d supply, fill #0

## 2024-07-19 MED ORDER — PANTOPRAZOLE SODIUM 40 MG PO TBEC: 40.0000 mg | DELAYED_RELEASE_TABLET | Freq: Two times a day (BID) | ORAL | 1 refills | Status: AC

## 2024-07-19 MED ORDER — MAGNESIUM OXIDE -MG SUPPLEMENT 400 (240 MG) MG PO TABS
400.0000 mg | ORAL_TABLET | Freq: Every day | ORAL | 2 refills | Status: DC
  Filled 2024-07-19: qty 30, 30d supply, fill #0

## 2024-07-19 MED ORDER — CIPROFLOXACIN HCL 0.3 % OP SOLN: 1.0000 [drp] | OPHTHALMIC | 0 refills | Status: DC

## 2024-07-19 MED ORDER — FLUCONAZOLE 200 MG PO TABS: 200.0000 mg | ORAL_TABLET | Freq: Every day | ORAL | 0 refills | Status: AC

## 2024-07-19 MED ORDER — FLUCONAZOLE 200 MG PO TABS
200.0000 mg | ORAL_TABLET | Freq: Every day | ORAL | 0 refills | Status: DC
  Filled 2024-07-19: qty 18, 18d supply, fill #0

## 2024-07-19 MED ORDER — FERROUS SULFATE 325 (65 FE) MG PO TABS: 325.0000 mg | ORAL_TABLET | Freq: Every day | ORAL | 3 refills | Status: AC

## 2024-07-19 MED ORDER — MIDODRINE HCL 5 MG PO TABS
5.0000 mg | ORAL_TABLET | Freq: Three times a day (TID) | ORAL | 1 refills | Status: DC
  Filled 2024-07-19: qty 90, 30d supply, fill #0

## 2024-07-19 MED ORDER — PANTOPRAZOLE SODIUM 40 MG PO TBEC
40.0000 mg | DELAYED_RELEASE_TABLET | Freq: Two times a day (BID) | ORAL | 1 refills | Status: DC
  Filled 2024-07-19: qty 60, 30d supply, fill #0

## 2024-07-19 MED ORDER — NICOTINE 14 MG/24HR TD PT24
14.0000 mg | MEDICATED_PATCH | Freq: Every day | TRANSDERMAL | 0 refills | Status: DC
  Filled 2024-07-19: qty 28, 28d supply, fill #0

## 2024-07-19 NOTE — Progress Notes (Signed)
 Patient was very anxious to leave, he kept coming out of the room. I told him I will give him his morning medication before he leaves. He took some but threw away 3 pills, stated he doesn't take all these medication at home. Tried to educate the patient , he didn't want to listen. Refused eye drops, I told him he needs his Cipro  eyedrop every 2 hours, I gave his both eye drops to take it with him. All he allowed was BP this morning, he said his vitals was just done ( around 0500).He didn't want to listen to D/c teaching, he asked where he needs to sign, when I said he don't need to, he just grabbed the papers and said that's all. His sister was notified of his d/c by patient himself. He got is $100 that was in the envelope in the chart. Picked up by D/c Lounge, I had told staff he needs to stop by pharmacy to pick his meds. D/c Lounge staff came back and notified me that patient refused to pick up his meds and wanted to pick it up in his regular pharmacy. Pt walked out to ED area to smoke and wait for his ride. Notified Dr. Cherlyn, requested to sent his meds to his regular pharmacy, MD agreed. But I doubt he will even pick his medication.

## 2024-07-19 NOTE — Plan of Care (Signed)

## 2024-07-19 NOTE — Progress Notes (Signed)
 When entered the room for bedside report, patient was all dressed up, has taken his tele off and IV off. Asked  which one of you is a doctor, I have to leave now . Pt stated something about court date at 0900 today. Refused to put tele back.  Paged Dr. Cherlyn, MD stated she will start working on his discharge. CCMD made aware. Plan of care continues.

## 2024-07-20 ENCOUNTER — Ambulatory Visit: Payer: Self-pay | Admitting: Internal Medicine

## 2024-07-20 LAB — VITAMIN B1: Vitamin B1 (Thiamine): 60.1 nmol/L — ABNORMAL LOW (ref 66.5–200.0)

## 2024-07-20 NOTE — Discharge Summary (Signed)
 Physician Discharge Summary   Patient: [name] MRN: [id number] DOB: 1981/09/12  Admit date:     07/15/2024  Discharge date: 07/19/2024  Discharge Physician: Elgie Butter   PCP: Anice Wilbert BIRCH, NP   Recommendations at discharge:  Please follow up with PCP in one week.  Please follow up with Gastroenterology as recommended.  Please check cbc and bmp in one week.  Please follow up with ophthalmology in one week.   Discharge Diagnoses: Principal Problem:   Acute upper GI bleeding Active Problems:   Acute blood loss anemia (ABLA)   Hypokalemia   Thrombocytopenia (HCC)   Seizure disorder (HCC)   History of alcohol  abuse   Substance abuse (HCC)   Gastritis and gastroduodenitis   Candida esophagitis (HCC)   Gonococcal conjunctivitis   Encounter for HIV pre-exposure prophylaxis  Resolved Problems:   * No resolved hospital problems. *  Hospital Course:  43 year old gentleman with prior history of heart failure with reduced EF of 40 to 50%, seizure disorder, alcohol  use disorder s/p left BKA presents with generalized abdominal pain associated with nausea and vomiting and coffee-ground emesis.  CT of the abdomen and pelvis done showed cholelithiasis, hepatic steatosis and 4 cm ascending thoracic aortic aneurysm. GI consulted patient underwent EGD showing esophageal candidiasis.   Assessment and Plan:   Acute on Anemia of chronic disease in the setting of iron deficiency anemia Transfuse to keep hemoglobin greater than 7.     Coffee ground emesis/ Erosive gastritis: Gastric biopsies shows active chronic gastritis and intestinal metaplasia.  H. pylori stain is pending Please follow up with gastroenterology as recommended.      Esophageal candidiasis patient currently on Diflucan , continue the same to complete the course.       Illicit drug use with cocaine Counseling given to encourage cessation       Alcohol  related cirrhosis Unfortunately patient continues to  take alcohol . No signs of withdrawals at this time GI on board and appreciate recommendations.       Severe purulent conjunctivitis with corneal involvement Rule out gonorrhea, culture media requested from the lab. Empirically treated with ceftriaxone  and ciprofloxacin  eyedrops every 2 hours as recommended by ophthalmology     History of seizure disorder Stable     History of BKA Therapy evaluations ordered     Thrombocytopenia From liver disease.      Hypokalemia Replaced.   Hypomagnesemia Replaced.  A prescription sent to the pharmacy.  Repeat magnesium  level in one week.        Consultants: gastroenterology.  Procedures performed: EGD  Disposition: Home Diet recommendation:  Discharge Diet Orders (From admission, onward)     Start     Ordered   07/19/24 0000  Diet - low sodium heart healthy        07/19/24 0801           Regular diet DISCHARGE MEDICATION: Allergies as of 07/19/2024   No Known Allergies      Medication List     STOP taking these medications    ibuprofen  600 MG tablet Commonly known as: ADVIL    omeprazole 40 MG capsule Commonly known as: PRILOSEC       TAKE these medications    ciprofloxacin  0.3 % ophthalmic solution Commonly known as: CILOXAN  Administer 1 drop, in both eyes every 2 hours, while awake, for 2 days. Then 1 drop, every 4 hours, while awake, for the next 5 days.   Descovy  200-25 MG tablet Generic drug: emtricitabine -tenofovir  AF Take  1 tablet by mouth daily.   doxycycline  100 MG tablet Commonly known as: VIBRA -TABS Take 1 tablet (100 mg total) by mouth 2 (two) times daily for 5 days.   ferrous sulfate  325 (65 FE) MG tablet Take 1 tablet (325 mg total) by mouth daily with breakfast.   fluconazole  200 MG tablet Commonly known as: DIFLUCAN  Take 1 tablet (200 mg total) by mouth daily for 18 days.   levETIRAcetam  750 MG tablet Commonly known as: Keppra  Take 1/2 tablet twice a day   magnesium  oxide  400 (240 Mg) MG tablet Commonly known as: MAG-OX Take 1 tablet (400 mg total) by mouth daily.   midodrine  5 MG tablet Commonly known as: PROAMATINE  Take 1 tablet (5 mg total) by mouth 3 (three) times daily with meals.   nicotine  14 mg/24hr patch Commonly known as: NICODERM CQ  - dosed in mg/24 hours Place 1 patch (14 mg total) onto the skin daily.   nystatin  100000 UNIT/ML suspension Commonly known as: MYCOSTATIN  Take 5 mLs (500,000 Units total) by mouth 4 (four) times daily.   pantoprazole  40 MG tablet Commonly known as: PROTONIX  Take 1 tablet (40 mg total) by mouth 2 (two) times daily.   Percocet 5-325 MG tablet Generic drug: oxyCODONE -acetaminophen  Take 1 tablet by mouth every 8 (eight) hours as needed for moderate pain (pain score 4-6).   polyethylene glycol powder 17 GM/SCOOP powder Commonly known as: GLYCOLAX /MIRALAX  Take 17 g by mouth 2 (two) times daily.        Follow-up Information     Anice Wilbert BIRCH, NP. Schedule an appointment as soon as possible for a visit in 1 week(s).   Specialty: Family Medicine Contact information: [address] [place] [address] [phone]         Leslee Reusing, MD. Schedule an appointment as soon as possible for a visit in 1 week(s).   Specialty: Ophthalmology Contact information: [address] [place] [address] [phone]         Albertus Gordy HERO, MD. Schedule an appointment as soon as possible for a visit in 1 week(s).   Specialty: Gastroenterology Contact information: [address] [place] [address] [phone]                Discharge Exam: Filed Weights   07/15/24 1459 07/16/24 0801  Weight: 83.6 kg 80.2 kg   General exam: Appears calm and comfortable  Respiratory system: Clear to auscultation. Respiratory effort normal. Cardiovascular system: S1 & S2 heard, RRR. Gastrointestinal system: Abdomen is nondistended, soft and nontender.  Central nervous system: Alert and  oriented.  Extremities: Symmetric 5 x 5 power. Skin: No rashes, Psychiatry: Mood & affect appropriate.    Condition at discharge: fair  The results of significant diagnostics from this hospitalization (including imaging, microbiology, ancillary and laboratory) are listed below for reference.   Imaging Studies: CT CHEST ABDOMEN PELVIS W CONTRAST Result Date: 07/15/2024 CLINICAL DATA:  Clemens, left lower rib pain, hematemesis EXAM: CT CHEST, ABDOMEN, AND PELVIS WITH CONTRAST TECHNIQUE: Multidetector CT imaging of the chest, abdomen and pelvis was performed following the standard protocol during bolus administration of intravenous contrast. RADIATION DOSE REDUCTION: This exam was performed according to the departmental dose-optimization program which includes automated exposure control, adjustment of the mA and/or kV according to patient size and/or use of iterative reconstruction technique. CONTRAST:  75mL OMNIPAQUE  IOHEXOL  350 MG/ML SOLN COMPARISON:  09/21/2023, 04/27/2012 FINDINGS: CT CHEST FINDINGS Cardiovascular: The heart is unremarkable without pericardial effusion. 4 cm ascending  thoracic aortic aneurysm with no evidence of dissection. Atherosclerosis of the aorta and coronary vasculature. Mediastinum/Nodes: No enlarged mediastinal, hilar, or axillary lymph nodes. Thyroid gland, trachea, and esophagus demonstrate no significant findings. Lungs/Pleura: No acute airspace disease, effusion, or pneumothorax. Central airways are patent. Musculoskeletal: Multiple prior healed left lower rib fractures. No acute or destructive bony abnormalities. Reconstructed images demonstrate no additional findings. CT ABDOMEN PELVIS FINDINGS Hepatobiliary: Hepatic steatosis. No focal liver abnormality or biliary duct dilation. Multiple small gallstones layer dependently within a distended gallbladder, with no gallbladder wall thickening or pericholecystic fat stranding to suggest cholecystitis. Pancreas: Unremarkable. No  pancreatic ductal dilatation or surrounding inflammatory changes. Spleen: Normal in size without focal abnormality. Adrenals/Urinary Tract: Multiple simple bilateral renal cortical cysts do not require specific imaging follow-up. No urinary tract calculi or obstructive uropathy. The adrenals and bladder are unremarkable. Stomach/Bowel: No bowel obstruction or ileus. Normal retrocecal appendix. Scattered colonic diverticulosis without evidence of acute diverticulitis. No bowel wall thickening or inflammatory change. Vascular/Lymphatic: Aortic atherosclerosis. No enlarged abdominal or pelvic lymph nodes. Reproductive: Prostate is unremarkable. Other: No free fluid or free intraperitoneal gas. No abdominal wall hernia. Musculoskeletal: No acute or destructive bony abnormalities. Postsurgical changes from left hip ORIF. Avascular necrosis of the left femoral head without evidence of subchondral collapse. IMPRESSION: 1. No acute intrathoracic, intra-abdominal, or intrapelvic trauma. 2. Cholelithiasis without evidence of acute cholecystitis. 3. Scattered colonic diverticulosis without evidence of acute diverticulitis. 4. 4 cm ascending thoracic aortic aneurysm. Recommend annual imaging followup by CTA or MRA. This recommendation follows 2010 ACCF/AHA/AATS/ACR/ASA/SCA/SCAI/SIR/STS/SVM Guidelines for the Diagnosis and Management of Patients with Thoracic Aortic Disease. Circulation. 2010; 121: Z733-z630. Aortic aneurysm NOS (ICD10-I71.9) 5. Hepatic steatosis. 6. Aortic Atherosclerosis (ICD10-I70.0). Coronary artery atherosclerosis. Electronically Signed   By: Ozell Daring M.D.   On: 07/15/2024 19:41   CT Head Wo Contrast Result Date: 07/15/2024 EXAM: CT HEAD AND CERVICAL SPINE 07/15/2024 07:02:08 PM TECHNIQUE: CT of the head and cervical spine was performed without the administration of intravenous contrast. Multiplanar reformatted images are provided for review. Automated exposure control, iterative reconstruction,  and/or weight based adjustment of the mA/kV was utilized to reduce the radiation dose to as low as reasonably achievable. COMPARISON: None available. CLINICAL HISTORY: Fall, loss of consciousness. Patient from home with EMS, vomiting started yesterday, today was coffee ground like. Complains of generalized abdominal pain. History of alcohol  abuse. FINDINGS: CT HEAD BRAIN AND VENTRICLES: No acute intracranial hemorrhage. No mass effect or midline shift. No abnormal extra-axial fluid collection. Gray-white differentiation is maintained. No hydrocephalus. ORBITS: No acute abnormality. SINUSES AND MASTOIDS: No acute abnormality. SOFT TISSUES AND SKULL: No acute skull fracture. No acute soft tissue abnormality. CT CERVICAL SPINE BONES AND ALIGNMENT: No acute fracture or traumatic malalignment. DEGENERATIVE CHANGES: No significant degenerative changes. SOFT TISSUES: No prevertebral soft tissue swelling. IMPRESSION: 1. No acute intracranial abnormality. 2. No acute fracture or traumatic malalignment of the cervical spine. Electronically signed by: Franky Stanford MD 07/15/2024 07:15 PM EDT RP Workstation: [id number]   CT Cervical Spine Wo Contrast Result Date: 07/15/2024 EXAM: CT HEAD AND CERVICAL SPINE 07/15/2024 07:02:08 PM TECHNIQUE: CT of the head and cervical spine was performed without the administration of intravenous contrast. Multiplanar reformatted images are provided for review. Automated exposure control, iterative reconstruction, and/or weight based adjustment of the mA/kV was utilized to reduce the radiation dose to as low as reasonably achievable. COMPARISON: None available. CLINICAL HISTORY: Fall, loss of consciousness. Patient from home with EMS, vomiting started yesterday, today was coffee  ground like. Complains of generalized abdominal pain. History of alcohol  abuse. FINDINGS: CT HEAD BRAIN AND VENTRICLES: No acute intracranial hemorrhage. No mass effect or midline shift. No abnormal extra-axial fluid  collection. Gray-white differentiation is maintained. No hydrocephalus. ORBITS: No acute abnormality. SINUSES AND MASTOIDS: No acute abnormality. SOFT TISSUES AND SKULL: No acute skull fracture. No acute soft tissue abnormality. CT CERVICAL SPINE BONES AND ALIGNMENT: No acute fracture or traumatic malalignment. DEGENERATIVE CHANGES: No significant degenerative changes. SOFT TISSUES: No prevertebral soft tissue swelling. IMPRESSION: 1. No acute intracranial abnormality. 2. No acute fracture or traumatic malalignment of the cervical spine. Electronically signed by: Franky Stanford MD 07/15/2024 07:15 PM EDT RP Workstation: [id number]    Microbiology: Results for orders placed or performed during the hospital encounter of 11/13/22  Blood culture (routine x 2)     Status: None   Collection Time: 11/13/22  1:07 PM   Specimen: BLOOD RIGHT HAND  Result Value Ref Range Status   Specimen Description BLOOD RIGHT HAND  Final   Special Requests   Final    BOTTLES DRAWN AEROBIC AND ANAEROBIC Blood Culture results may not be optimal due to an inadequate volume of blood received in culture bottles   Culture   Final    NO GROWTH 5 DAYS Performed at [facility], [address] [place], [place] [address]    Report Status 11/18/2022 FINAL  Final  Blood culture (routine x 2)     Status: None   Collection Time: 11/13/22  1:10 PM   Specimen: BLOOD  Result Value Ref Range Status   Specimen Description BLOOD SITE NOT SPECIFIED  Final   Special Requests   Final    BOTTLES DRAWN AEROBIC AND ANAEROBIC Blood Culture adequate volume   Culture   Final    NO GROWTH 5 DAYS Performed at [facility], [address] [place], [place] [address]    Report Status 11/18/2022 FINAL  Final  Resp Panel by RT-PCR (Flu A&B, Covid) Anterior Nasal Swab     Status: None   Collection Time: 11/13/22  1:55 PM   Specimen: Anterior Nasal Swab  Result Value Ref Range Status   SARS Coronavirus 2 by RT PCR NEGATIVE NEGATIVE  Final    Comment: (NOTE) SARS-CoV-2 target nucleic acids are NOT DETECTED.  The SARS-CoV-2 RNA is generally detectable in upper respiratory specimens during the acute phase of infection. The lowest concentration of SARS-CoV-2 viral copies this assay can detect is 138 copies/mL. A negative result does not preclude SARS-Cov-2 infection and should not be used as the sole basis for treatment or other patient management decisions. A negative result may occur with  improper specimen collection/handling, submission of specimen other than nasopharyngeal swab, presence of viral mutation(s) within the areas targeted by this assay, and inadequate number of viral copies(<138 copies/mL). A negative result must be combined with clinical observations, patient history, and epidemiological information. The expected result is Negative.  Fact Sheet for Patients:  BloggerCourse.com  Fact Sheet for Healthcare Providers:  SeriousBroker.it  This test is no t yet approved or cleared by the [place]  FDA and  has been authorized for detection and/or diagnosis of SARS-CoV-2 by FDA under an Emergency Use Authorization (EUA). This EUA will remain  in effect (meaning this test can be used) for the duration of the COVID-19 declaration under Section 564(b)(1) of the Act, 21 U.S.C.section 360bbb-3(b)(1), unless the authorization is terminated  or revoked sooner.       Influenza  A by PCR NEGATIVE NEGATIVE Final   Influenza B by PCR NEGATIVE NEGATIVE Final    Comment: (NOTE) The Xpert Xpress SARS-CoV-2/FLU/RSV plus assay is intended as an aid in the diagnosis of influenza from Nasopharyngeal swab specimens and should not be used as a sole basis for treatment. Nasal washings and aspirates are unacceptable for Xpert Xpress SARS-CoV-2/FLU/RSV testing.  Fact Sheet for Patients: BloggerCourse.com  Fact Sheet for Healthcare  Providers: SeriousBroker.it  This test is not yet approved or cleared by the [place]  FDA and has been authorized for detection and/or diagnosis of SARS-CoV-2 by FDA under an Emergency Use Authorization (EUA). This EUA will remain in effect (meaning this test can be used) for the duration of the COVID-19 declaration under Section 564(b)(1) of the Act, 21 U.S.C. section 360bbb-3(b)(1), unless the authorization is terminated or revoked.  Performed at [facility], [address], [place], [place] [address]     Labs: CBC: Recent Labs  Lab 07/15/24 1614 07/16/24 0501 07/17/24 0351 07/18/24 0311 07/19/24 0322  WBC 7.5 8.3 9.2 8.7 8.5  NEUTROABS  --   --  4.3 3.5 3.4  HGB 7.5* 9.7* 9.3* 8.8* 9.5*  HCT 22.4* 28.7* 27.2* 25.9* 28.7*  MCV 98.7 101.1* 97.1 98.9 101.1*  PLT 29* 59* 62* 69* 84*   Basic Metabolic Panel: Recent Labs  Lab 07/15/24 1614 07/16/24 0501 07/17/24 0351 07/18/24 0311 07/18/24 2042 07/19/24 0322  NA 137 133* 132* 135  --  134*  K 2.6* 4.5 3.4* 3.1*  --  3.8  CL 109 101 98 100  --  100  CO2 17* 19* 25 26  --  26  GLUCOSE 68* 93 137* 112*  --  116*  BUN 7 8 5* <5*  --  <5*  CREATININE 0.78 0.97 0.80 0.75  --  0.74  CALCIUM  6.9* 8.8* 8.7* 9.0  --  9.3  MG 1.2* 2.0  --   --  1.0* 1.0*   Liver Function Tests: Recent Labs  Lab 07/15/24 1614 07/16/24 0501 07/17/24 0351 07/18/24 0311 07/19/24 0322  AST 87* 86* 64* 55* 80*  ALT 23 24 24 22 30   ALKPHOS 59 71 73 90 88  BILITOT 2.4* 2.9* 2.7* 2.3* 2.5*  PROT 6.1* 6.9 6.6 6.7 7.1  ALBUMIN  2.1* 2.4* 2.4* 3.0* 2.9*   CBG: Recent Labs  Lab 07/15/24 1502  GLUCAP 83    Discharge time spent: 41 minutes.   Signed: Rilen Shukla, MD [facility]

## 2024-07-27 ENCOUNTER — Ambulatory Visit: Payer: Medicaid Other | Admitting: Neurology

## 2024-07-27 ENCOUNTER — Encounter: Payer: Self-pay | Admitting: Neurology

## 2024-07-27 VITALS — BP 82/59 | HR 79 | Resp 18 | Wt 172.0 lb

## 2024-07-27 DIAGNOSIS — R569 Unspecified convulsions: Secondary | ICD-10-CM

## 2024-07-27 DIAGNOSIS — M791 Myalgia, unspecified site: Secondary | ICD-10-CM | POA: Diagnosis not present

## 2024-07-27 MED ORDER — CYCLOBENZAPRINE HCL 5 MG PO TABS: 5.0000 mg | ORAL_TABLET | Freq: Three times a day (TID) | ORAL | 5 refills | Status: AC | PRN

## 2024-07-27 MED ORDER — LEVETIRACETAM 750 MG PO TABS: ORAL_TABLET | ORAL | 11 refills | Status: AC

## 2024-07-27 NOTE — Patient Instructions (Addendum)
 Good to see you.  It is important to the the seizure medication as prescribed to help prevent a seizure. Please start taking the Levetiracetam  (Keppra ) 750mg : 1 tablet in morning, 1 tablet in evening  2. A prescription was sent for Flexeril  to help with muscle spasms  3. Please call GI (Dr. Gordy Starch) for follow-up on your stomach issues: [phone]  4. Follow-up with your PCP for the blood pressure  5. Follow-up with me in 6 months, call for any changes   Seizure Precautions: 1. If medication has been prescribed for you to prevent seizures, take it exactly as directed.  Do not stop taking the medicine without talking to your doctor first, even if you have not had a seizure in a long time.   2. Avoid activities in which a seizure would cause danger to yourself or to others.  Don't operate dangerous machinery, swim alone, or climb in high or dangerous places, such as on ladders, roofs, or girders.  Do not drive unless your doctor says you may.  3. If you have any warning that you may have a seizure, lay down in a safe place where you can't hurt yourself.    4.  No driving for 6 months from last seizure, as per [place]  state law.   Please refer to the following link on the [employer]ile[employer]'s website for more information: http://www.epilepsyfoundation.org/answerplace/Social/driving/drivingu.cfm   5.  Maintain good sleep hygiene. Avoid alcohol .  6.  Contact your doctor if you have any problems that may be related to the medicine you are taking.  7.  Call 911 and bring the patient back to the ED if:        A.  The seizure lasts longer than 5 minutes.       B.  The patient doesn't awaken shortly after the seizure  C.  The patient has new problems such as difficulty seeing, speaking or moving  D.  The patient was injured during the seizure  E.  The patient has a temperature over 102 F (39C)  F.  The patient vomited and now is having trouble breathing
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
## 2024-07-31 NOTE — Progress Notes (Unsigned)
 HPI: [name] is a 43 y.o. male who presents to the [facility] pharmacy clinic to discuss and initiate PrEP.  Insured   [x]    Uninsured  []    Patient Active Problem List   Diagnosis Date Noted   Gonococcal conjunctivitis 07/18/2024   Encounter for HIV pre-exposure prophylaxis 07/18/2024   Acute upper GI bleeding 07/16/2024   History of alcohol  abuse 07/16/2024   Substance abuse (HCC) 07/16/2024   Gastritis and gastroduodenitis 07/16/2024   Candida esophagitis (HCC) 07/16/2024   DTs (delirium tremens) (HCC)    Essential hypertension    Elevated transaminase level    Transaminasemia    Epistaxis 07/08/2021   Hematemesis with nausea    Posterior epistaxis    Acute blood loss anemia (ABLA)    Hemorrhagic shock (HCC)    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   Fever 05/30/2020   Positive blood culture 05/30/2020   Sinusitis 05/30/2020   Delirium 05/30/2020   Other specified anemias 05/30/2020   Seizure disorder (HCC)    Acute hypoxemic respiratory failure (HCC)    Elective surgery    S/p left hip fracture    Respiratory failure (HCC)    Seizure (HCC) 05/23/2020   Sepsis (HCC) 06/05/2016   Thrombocytopenia (HCC) 06/05/2016   Alcohol  abuse with intoxication (HCC) 06/05/2016   Fatty liver 06/05/2016   Asthma 06/05/2016   Fall at home 05/03/2012   postural orthostatic tachycardia 05/03/2012   Hypokalemia 05/03/2012   Steatosis of liver 05/03/2012   Gallstones 05/03/2012   Thoracic ascending aortic aneurysm (HCC) 05/03/2012   History of intellectual disability 05/03/2012   Tobacco abuse 05/03/2012   Syncope 04/27/2012   Alcohol  abuse 04/27/2012   Hx of BKA, left (HCC) 04/27/2012    Patient's Medications  New Prescriptions   No medications on file  Previous Medications   CIPROFLOXACIN  (CILOXAN ) 0.3 % OPHTHALMIC SOLUTION    Administer 1 drop, in both eyes every 2 hours, while awake, for 2 days. Then 1 drop, every 4 hours, while awake, for the next 5 days.   CYCLOBENZAPRINE   (FLEXERIL ) 5 MG TABLET    Take 1 tablet (5 mg total) by mouth every 8 (eight) hours as needed for muscle spasms.   EMTRICITABINE -TENOFOVIR  AF (DESCOVY ) 200-25 MG TABLET    Take 1 tablet by mouth daily.   FERROUS SULFATE  325 (65 FE) MG TABLET    Take 1 tablet (325 mg total) by mouth daily with breakfast.   FLUCONAZOLE  (DIFLUCAN ) 200 MG TABLET    Take 1 tablet (200 mg total) by mouth daily for 18 days.   LEVETIRACETAM  (KEPPRA ) 750 MG TABLET    Take 1 tablet twice a day   MAGNESIUM  OXIDE (MAG-OX) 400 (240 MG) MG TABLET    Take 1 tablet (400 mg total) by mouth daily.   MIDODRINE  (PROAMATINE ) 5 MG TABLET    Take 1 tablet (5 mg total) by mouth 3 (three) times daily with meals.   NICOTINE  (NICODERM CQ  - DOSED IN MG/24 HOURS) 14 MG/24HR PATCH    Place 1 patch (14 mg total) onto the skin daily.   NYSTATIN  (MYCOSTATIN ) 100000 UNIT/ML SUSPENSION    Take 5 mLs (500,000 Units total) by mouth 4 (four) times daily.   PANTOPRAZOLE  (PROTONIX ) 40 MG TABLET    Take 1 tablet (40 mg total) by mouth 2 (two) times daily.   PERCOCET 5-325 MG TABLET    Take 1 tablet by mouth every 8 (eight) hours as needed for moderate pain (pain score 4-6).   POLYETHYLENE  GLYCOL POWDER (GLYCOLAX /MIRALAX ) 17 GM/SCOOP POWDER    Take 17 g by mouth 2 (two) times daily.  Modified Medications   No medications on file  Discontinued Medications   No medications on file       08/01/2024    1:16 PM  [facility] HIV PREP FLOWSHEET RESULTS  Gender at birth Male  Sex Partners Women only  # sex partners past 3-6 mos 1  Condom use Yes  Treated for STI? N/A    Labs:  SCr: Lab Results  Component Value Date   CREATININE 0.74 07/19/2024   CREATININE 0.75 07/18/2024   CREATININE 0.80 07/17/2024   CREATININE 0.97 07/16/2024   CREATININE 0.78 07/15/2024   HIV Lab Results  Component Value Date   HIV Non Reactive 07/16/2024   HIV Non Reactive 11/13/2022   HIV Non Reactive 07/08/2021   HIV Non Reactive 05/24/2020   HIV Non Reactive 06/05/2016    Hepatitis B Lab Results  Component Value Date   HEPBSAG NON REACTIVE 08/03/2023   Hepatitis C No results found for: HEPCAB, HCVRNAPCRQN Hepatitis A No results found for: HAV RPR and STI Lab Results  Component Value Date   LABRPR NON REACTIVE 07/18/2024    STI Results GC CT  07/17/2024  9:01 PM Negative  Negative   07/17/2024  9:00 PM Negative  Negative   07/17/2024  8:56 PM Negative  Negative   07/17/2024  6:53 PM Negative  Negative   07/17/2024  6:52 PM Negative  Negative   12/15/2021  2:01 PM Negative  Negative     Assessment: [name] is here for PrEP initiation. He was recently seen in the hospital for severe purulent conjunctivitis with corneal involvement. He was prescribed fluconazole  for candida esophagitis, ciprofloxacin  eye drops for possible bacterial conjunctivitis, and doxycycline  for empiric chlamydia infection. Today, he reports his eye is better with some residual swelling, but he did not pick up any of the prescribed medications as he could not afford them.  We discussed options for PrEP; he is motivated to start oral therapy with Descovy . We will check a baseline lipid panel and HIV antibody today. His most recent CMP was 07/19/24 so we will not repeat one today. We dicussed that he is eligible for the HPV and HAV vaccines. He agrees to get his first dose of these today. He is due for his next HPV vaccine 09/01/24 and HAV 02/01/2025. We will check Hepatitis B antibody today to determine immunity.   Number of partners in last 3 months: 1 Partner preference: women only Sexual practices: protected sex with a single partner  Use of prevention/protection: uses condoms  STD history: none History of IVDU? none History of PEP? none  Plan: - Check HIV antibody - If negative, start Descovy  - Check lipid panel - Administer HPV vaccine - Administer HAV vaccine - Check HBV antibody - Follow up visit 10/30/2024  Elma Fail, PharmD PGY1 Clinical Pharmacist [facility]  08/01/2024 1:33 PM

## 2024-08-01 ENCOUNTER — Other Ambulatory Visit: Payer: Self-pay

## 2024-08-01 ENCOUNTER — Ambulatory Visit: Admitting: Pharmacist

## 2024-08-01 DIAGNOSIS — Z0000 Encounter for general adult medical examination without abnormal findings: Secondary | ICD-10-CM

## 2024-08-01 DIAGNOSIS — Z2981 Encounter for HIV pre-exposure prophylaxis: Secondary | ICD-10-CM

## 2024-08-01 DIAGNOSIS — Z23 Encounter for immunization: Secondary | ICD-10-CM

## 2024-08-01 DIAGNOSIS — Z113 Encounter for screening for infections with a predominantly sexual mode of transmission: Secondary | ICD-10-CM

## 2024-08-01 MED ORDER — CABOTEGRAVIR ER 600 MG/3ML IM SUER: 600.0000 mg | Freq: Once | INTRAMUSCULAR | Status: DC

## 2024-08-01 NOTE — Progress Notes (Signed)
 NEW REFERRAL TO [facility]

## 2024-08-02 LAB — LIPID PANEL
Cholesterol: 87 mg/dL (ref <200)
HDL: 26 mg/dL — ABNORMAL LOW (ref >=40)
LDL Cholesterol (Calc): 48 mg/dL
Non-HDL Cholesterol (Calc): 61 mg/dL (ref <130)
Total CHOL/HDL Ratio: 3.3 (calc) (ref <5.0)
Triglycerides: 58 mg/dL (ref <150)

## 2024-08-02 LAB — HIV ANTIBODY (ROUTINE TESTING W REFLEX): HIV 1&2 Ab, 4th Generation: NONREACTIVE

## 2024-08-02 LAB — HEPATITIS B SURFACE ANTIBODY,QUALITATIVE: Hep B S Ab: NONREACTIVE

## 2024-08-03 MED ORDER — DESCOVY 200-25 MG PO TABS: 1.0000 | ORAL_TABLET | Freq: Every day | ORAL | 2 refills | Status: AC

## 2024-08-03 NOTE — Addendum Note (Signed)
 Addended by: ALVERIA MODEST on: 08/03/2024 09:05 AM   Modules accepted: Orders

## 2024-09-19 ENCOUNTER — Ambulatory Visit (HOSPITAL_COMMUNITY)
Admission: EM | Admit: 2024-09-19 | Discharge: 2024-09-19 | Disposition: A | Discharge status: Home / Self Care | Attending: Family Medicine | Admitting: Family Medicine

## 2024-09-19 ENCOUNTER — Encounter (HOSPITAL_COMMUNITY): Payer: Self-pay

## 2024-09-19 DIAGNOSIS — H00031 Abscess of right upper eyelid: Secondary | ICD-10-CM

## 2024-09-19 MED ORDER — NYSTATIN 100000 UNIT/ML MT SUSP: 5.0000 mL | Freq: Four times a day (QID) | OROMUCOSAL | 0 refills | Status: AC

## 2024-09-19 MED ORDER — AMOXICILLIN-POT CLAVULANATE 875-125 MG PO TABS: 1.0000 | ORAL_TABLET | Freq: Two times a day (BID) | ORAL | 0 refills | Status: AC

## 2024-09-19 NOTE — Discharge Instructions (Signed)
 You were seen today for an eyelid infection.  I have sent out an oral antibiotic to treat this today.  Please use a warm compress to the eye as well to help it drain.  If this is not improving or worsening then please return for re-evaluation.    I have refilled the nystatin  per your request.
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
## 2024-09-19 NOTE — ED Triage Notes (Signed)
 Patient reports that he has had right eye swelling and eye drainage x 3 days. Patient denies light sensitivity or blurred vision.

## 2024-10-25 ENCOUNTER — Encounter: Payer: Self-pay | Admitting: Neurology

## 2024-10-29 NOTE — Progress Notes (Unsigned)
 HPI: [name] is a 43 y.o. male who presents to the [facility] pharmacy clinic for HIV PrEP follow-up.  Referring ID Physician: Dr. Fleeta [name]   Patient Active Problem List   Diagnosis Date Noted   Gonococcal conjunctivitis 07/18/2024   Encounter for HIV pre-exposure prophylaxis 07/18/2024   Acute upper GI bleeding 07/16/2024   History of alcohol  abuse 07/16/2024   Substance abuse (HCC) 07/16/2024   Gastritis and gastroduodenitis 07/16/2024   Candida esophagitis (HCC) 07/16/2024   DTs (delirium tremens) (HCC)    Essential hypertension    Elevated transaminase level    Transaminasemia    Epistaxis 07/08/2021   Hematemesis with nausea    Posterior epistaxis    Acute blood loss anemia (ABLA)    Hemorrhagic shock (HCC)    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   Fever 05/30/2020   Positive blood culture 05/30/2020   Sinusitis 05/30/2020   Delirium 05/30/2020   Other specified anemias 05/30/2020   Seizure disorder (HCC)    Acute hypoxemic respiratory failure (HCC)    Elective surgery    S/p left hip fracture    Respiratory failure (HCC)    Seizure (HCC) 05/23/2020   Sepsis (HCC) 06/05/2016   Thrombocytopenia 06/05/2016   Alcohol  abuse with intoxication 06/05/2016   Fatty liver 06/05/2016   Asthma 06/05/2016   Fall at home 05/03/2012   postural orthostatic tachycardia 05/03/2012   Hypokalemia 05/03/2012   Steatosis of liver 05/03/2012   Gallstones 05/03/2012   Thoracic ascending aortic aneurysm (HCC) 05/03/2012   History of intellectual disability 05/03/2012   Tobacco abuse 05/03/2012   Syncope 04/27/2012   Alcohol  abuse 04/27/2012   Hx of BKA, left (HCC) 04/27/2012    Patient's Medications  New Prescriptions   No medications on file  Previous Medications   CYCLOBENZAPRINE  (FLEXERIL ) 5 MG TABLET    Take 1 tablet (5 mg total) by mouth every 8 (eight) hours as needed for muscle spasms.   EMTRICITABINE -TENOFOVIR  AF (DESCOVY ) 200-25 MG TABLET    Take 1 tablet by mouth daily.    EMTRICITABINE -TENOFOVIR  AF (DESCOVY ) 200-25 MG TABLET    Take 1 tablet by mouth daily.   FERROUS SULFATE  325 (65 FE) MG TABLET    Take 1 tablet (325 mg total) by mouth daily with breakfast.   LEVETIRACETAM  (KEPPRA ) 750 MG TABLET    Take 1 tablet twice a day   MAGNESIUM  OXIDE (MAG-OX) 400 (240 MG) MG TABLET    Take 1 tablet (400 mg total) by mouth daily.   MIDODRINE  (PROAMATINE ) 5 MG TABLET    Take 1 tablet (5 mg total) by mouth 3 (three) times daily with meals.   NICOTINE  (NICODERM CQ  - DOSED IN MG/24 HOURS) 14 MG/24HR PATCH    Place 1 patch (14 mg total) onto the skin daily.   NYSTATIN  (MYCOSTATIN ) 100000 UNIT/ML SUSPENSION    Take 5 mLs (500,000 Units total) by mouth 4 (four) times daily.   PANTOPRAZOLE  (PROTONIX ) 40 MG TABLET    Take 1 tablet (40 mg total) by mouth 2 (two) times daily.   PERCOCET 5-325 MG TABLET    Take 1 tablet by mouth every 8 (eight) hours as needed for moderate pain (pain score 4-6).   POLYETHYLENE GLYCOL POWDER (GLYCOLAX /MIRALAX ) 17 GM/SCOOP POWDER    Take 17 g by mouth 2 (two) times daily.  Modified Medications   No medications on file  Discontinued Medications   No medications on file       08/01/2024    1:16 PM  CH[facility] HIV PREP FLOWSHEET RESULTS  Gender at birth Male  Sex Partners Women only  # sex partners past 3-6 mos 1  Condom use Yes  Treated for STI? N/A    Labs:  SCr: Lab Results  Component Value Date   CREATININE 0.74 07/19/2024   CREATININE 0.75 07/18/2024   CREATININE 0.80 07/17/2024   CREATININE 0.97 07/16/2024   CREATININE 0.78 07/15/2024   HIV Lab Results  Component Value Date   HIV NON-REACTIVE 08/01/2024   HIV Non Reactive 07/16/2024   HIV Non Reactive 11/13/2022   HIV Non Reactive 07/08/2021   HIV Non Reactive 05/24/2020   Hepatitis B Lab Results  Component Value Date   HEPBSAB NON-REACTIVE 08/01/2024   HEPBSAG NON REACTIVE 08/03/2023   Hepatitis C No results found for: HEPCAB, HCVRNAPCRQN Hepatitis A No results found  for: HAV RPR and STI Lab Results  Component Value Date   LABRPR NON REACTIVE 07/18/2024    STI Results GC CT  07/17/2024  9:01 PM Negative  Negative   07/17/2024  9:00 PM Negative  Negative   07/17/2024  8:56 PM Negative  Negative   07/17/2024  6:53 PM Negative  Negative   07/17/2024  6:52 PM Negative  Negative   12/15/2021  2:01 PM Negative  Negative     Assessment: [name] is here today for HIV PrEP follow up. Reports doing well on *** Descovy  without any issues. Patient denies *** experiencing acute HIV symptoms (e.g., fever, night sweats, fatigue, muscle aches, rash, sore throat, lymphadenopathy, headache, nausea/vomiting/diarrhea).    How many missed doses? Last fill 08/03/24? Need refills still?  HIV symptoms?  New partners? STI testing?   Labs: Last HIV ab was negative on 08/01/2024; urine cytology for GC/Chlamydia, RPR ***  Eligible vaccinations: *** Influenza, Covid, HPV 2/3, Heplisav 1/2, Havrix 2/2 (due around 02/01/2025)  Plan: *** - HIV antibody, urine cytology for GC/Chlamydia, RPR *** - Tentatively plan to send prescription refills for Descovy  if HIV RNA is negative  - Follow up with *** on *** 3 months?   [name], PharmD PGY2 Infectious Diseases Pharmacy Resident

## 2024-10-30 ENCOUNTER — Ambulatory Visit: Admitting: Pharmacist

## 2024-10-30 DIAGNOSIS — Z113 Encounter for screening for infections with a predominantly sexual mode of transmission: Secondary | ICD-10-CM

## 2024-10-30 DIAGNOSIS — Z2981 Encounter for HIV pre-exposure prophylaxis: Secondary | ICD-10-CM

## 2024-12-03 ENCOUNTER — Telehealth: Payer: Self-pay

## 2024-12-03 DIAGNOSIS — D696 Thrombocytopenia, unspecified: Secondary | ICD-10-CM

## 2024-12-03 DIAGNOSIS — E876 Hypokalemia: Secondary | ICD-10-CM

## 2025-02-27 ENCOUNTER — Ambulatory Visit: Admitting: Neurology
